# Patient Record
Sex: Female | Born: 1950 | Race: White | Hispanic: No | Smoking: Never smoker
Health system: Southern US, Community
[De-identification: ages and names within clinical notes are randomized; demographics above are authoritative.]

## PROBLEM LIST (undated history)

## (undated) DIAGNOSIS — C8590 Non-Hodgkin lymphoma, unspecified, unspecified site: Secondary | ICD-10-CM

## (undated) DIAGNOSIS — K859 Acute pancreatitis without necrosis or infection, unspecified: Secondary | ICD-10-CM

## (undated) DIAGNOSIS — I10 Essential (primary) hypertension: Secondary | ICD-10-CM

## (undated) DIAGNOSIS — E669 Obesity, unspecified: Secondary | ICD-10-CM

## (undated) DIAGNOSIS — Z87440 Personal history of urinary (tract) infections: Secondary | ICD-10-CM

## (undated) DIAGNOSIS — M8580 Other specified disorders of bone density and structure, unspecified site: Secondary | ICD-10-CM

## (undated) DIAGNOSIS — F40240 Claustrophobia: Secondary | ICD-10-CM

## (undated) DIAGNOSIS — Z9289 Personal history of other medical treatment: Secondary | ICD-10-CM

## (undated) HISTORY — PX: BIOPSY THYROID: PRO38

## (undated) HISTORY — DX: Non-Hodgkin lymphoma, unspecified, unspecified site: C85.90

## (undated) HISTORY — DX: Personal history of other medical treatment: Z92.89

## (undated) HISTORY — PX: BONE MARROW BIOPSY: SHX199

## (undated) HISTORY — DX: Acute pancreatitis without necrosis or infection, unspecified: K85.90

## (undated) HISTORY — DX: Personal history of urinary (tract) infections: Z87.440

## (undated) HISTORY — PX: TONSILLECTOMY AND ADENOIDECTOMY: SUR1326

## (undated) HISTORY — PX: OTHER SURGICAL HISTORY: SHX169

## (undated) HISTORY — DX: Other specified disorders of bone density and structure, unspecified site: M85.80

## (undated) HISTORY — PX: PORTA CATH INSERTION: CATH118285

## (undated) HISTORY — PX: CHOLECYSTECTOMY: SHX55

## (undated) HISTORY — PX: TOTAL KNEE ARTHROPLASTY: SHX125

---

## 2006-02-21 DIAGNOSIS — C8590 Non-Hodgkin lymphoma, unspecified, unspecified site: Secondary | ICD-10-CM

## 2006-02-21 HISTORY — DX: Non-Hodgkin lymphoma, unspecified, unspecified site: C85.90

## 2014-01-28 DIAGNOSIS — N8500 Endometrial hyperplasia, unspecified: Secondary | ICD-10-CM | POA: Diagnosis not present

## 2015-02-23 DIAGNOSIS — C8590 Non-Hodgkin lymphoma, unspecified, unspecified site: Secondary | ICD-10-CM | POA: Diagnosis not present

## 2015-02-23 DIAGNOSIS — I10 Essential (primary) hypertension: Secondary | ICD-10-CM | POA: Diagnosis not present

## 2015-02-23 DIAGNOSIS — M1990 Unspecified osteoarthritis, unspecified site: Secondary | ICD-10-CM | POA: Diagnosis not present

## 2015-02-23 DIAGNOSIS — M6281 Muscle weakness (generalized): Secondary | ICD-10-CM | POA: Diagnosis not present

## 2015-02-23 DIAGNOSIS — T8453XD Infection and inflammatory reaction due to internal right knee prosthesis, subsequent encounter: Secondary | ICD-10-CM | POA: Diagnosis not present

## 2015-02-25 DIAGNOSIS — T8453XA Infection and inflammatory reaction due to internal right knee prosthesis, initial encounter: Secondary | ICD-10-CM | POA: Diagnosis not present

## 2015-03-31 DIAGNOSIS — Z471 Aftercare following joint replacement surgery: Secondary | ICD-10-CM | POA: Diagnosis not present

## 2015-03-31 DIAGNOSIS — Z96653 Presence of artificial knee joint, bilateral: Secondary | ICD-10-CM | POA: Diagnosis not present

## 2015-03-31 DIAGNOSIS — T8450XD Infection and inflammatory reaction due to unspecified internal joint prosthesis, subsequent encounter: Secondary | ICD-10-CM | POA: Diagnosis not present

## 2015-03-31 DIAGNOSIS — M25561 Pain in right knee: Secondary | ICD-10-CM | POA: Diagnosis not present

## 2015-04-08 DIAGNOSIS — M869 Osteomyelitis, unspecified: Secondary | ICD-10-CM | POA: Diagnosis not present

## 2015-04-08 DIAGNOSIS — M25561 Pain in right knee: Secondary | ICD-10-CM | POA: Diagnosis not present

## 2015-04-23 DIAGNOSIS — Z96653 Presence of artificial knee joint, bilateral: Secondary | ICD-10-CM | POA: Diagnosis not present

## 2015-04-23 DIAGNOSIS — Z471 Aftercare following joint replacement surgery: Secondary | ICD-10-CM | POA: Diagnosis not present

## 2015-06-24 DIAGNOSIS — M25562 Pain in left knee: Secondary | ICD-10-CM | POA: Diagnosis not present

## 2015-06-24 DIAGNOSIS — M25561 Pain in right knee: Secondary | ICD-10-CM | POA: Diagnosis not present

## 2015-07-08 ENCOUNTER — Encounter (HOSPITAL_COMMUNITY): Payer: Self-pay | Admitting: Emergency Medicine

## 2015-07-08 ENCOUNTER — Telehealth: Payer: Self-pay

## 2015-07-08 ENCOUNTER — Emergency Department (HOSPITAL_COMMUNITY)
Admission: EM | Admit: 2015-07-08 | Discharge: 2015-07-09 | Disposition: A | Discharge status: Home / Self Care | Payer: Medicare Other | Attending: Emergency Medicine | Admitting: Emergency Medicine

## 2015-07-08 ENCOUNTER — Ambulatory Visit (HOSPITAL_COMMUNITY)
Admission: RE | Admit: 2015-07-08 | Discharge: 2015-07-08 | Disposition: A | Discharge status: Home / Self Care | Payer: Medicare Other | Source: Ambulatory Visit | Attending: Family Medicine | Admitting: Family Medicine

## 2015-07-08 ENCOUNTER — Ambulatory Visit (INDEPENDENT_AMBULATORY_CARE_PROVIDER_SITE_OTHER): Payer: Medicare Other | Admitting: Family Medicine

## 2015-07-08 VITALS — BP 134/84 | HR 103 | Temp 98.3°F | Resp 17 | Ht 66.0 in | Wt 310.0 lb

## 2015-07-08 DIAGNOSIS — Z79899 Other long term (current) drug therapy: Secondary | ICD-10-CM | POA: Diagnosis not present

## 2015-07-08 DIAGNOSIS — R110 Nausea: Secondary | ICD-10-CM | POA: Insufficient documentation

## 2015-07-08 DIAGNOSIS — R112 Nausea with vomiting, unspecified: Secondary | ICD-10-CM | POA: Diagnosis not present

## 2015-07-08 DIAGNOSIS — N390 Urinary tract infection, site not specified: Secondary | ICD-10-CM | POA: Insufficient documentation

## 2015-07-08 DIAGNOSIS — E669 Obesity, unspecified: Secondary | ICD-10-CM | POA: Diagnosis not present

## 2015-07-08 DIAGNOSIS — R509 Fever, unspecified: Secondary | ICD-10-CM

## 2015-07-08 DIAGNOSIS — Z880 Allergy status to penicillin: Secondary | ICD-10-CM | POA: Insufficient documentation

## 2015-07-08 DIAGNOSIS — R0600 Dyspnea, unspecified: Secondary | ICD-10-CM | POA: Diagnosis not present

## 2015-07-08 DIAGNOSIS — R911 Solitary pulmonary nodule: Secondary | ICD-10-CM | POA: Diagnosis not present

## 2015-07-08 DIAGNOSIS — R918 Other nonspecific abnormal finding of lung field: Secondary | ICD-10-CM | POA: Diagnosis not present

## 2015-07-08 DIAGNOSIS — I10 Essential (primary) hypertension: Secondary | ICD-10-CM | POA: Diagnosis not present

## 2015-07-08 DIAGNOSIS — M25569 Pain in unspecified knee: Secondary | ICD-10-CM | POA: Diagnosis not present

## 2015-07-08 DIAGNOSIS — R5383 Other fatigue: Secondary | ICD-10-CM | POA: Diagnosis not present

## 2015-07-08 HISTORY — DX: Essential (primary) hypertension: I10

## 2015-07-08 HISTORY — DX: Obesity, unspecified: E66.9

## 2015-07-08 LAB — COMPREHENSIVE METABOLIC PANEL
ALT: 12 U/L — ABNORMAL LOW (ref 14–54)
AST: 13 U/L — ABNORMAL LOW (ref 15–41)
Albumin: 3.1 g/dL — ABNORMAL LOW (ref 3.5–5.0)
Alkaline Phosphatase: 57 U/L (ref 38–126)
Anion gap: 12 (ref 5–15)
BUN: 37 mg/dL — ABNORMAL HIGH (ref 6–20)
CO2: 26 mmol/L (ref 22–32)
Calcium: 9 mg/dL (ref 8.9–10.3)
Chloride: 101 mmol/L (ref 101–111)
Creatinine, Ser: 2.04 mg/dL — ABNORMAL HIGH (ref 0.44–1.00)
GFR calc Af Amer: 28 mL/min — ABNORMAL LOW (ref >60)
GFR calc non Af Amer: 24 mL/min — ABNORMAL LOW (ref >60)
Glucose, Bld: 142 mg/dL — ABNORMAL HIGH (ref 65–99)
Potassium: 3.5 mmol/L (ref 3.5–5.1)
Sodium: 139 mmol/L (ref 135–145)
Total Bilirubin: 0.6 mg/dL (ref 0.3–1.2)
Total Protein: 6.9 g/dL (ref 6.5–8.1)

## 2015-07-08 LAB — CBC WITH DIFFERENTIAL/PLATELET
Basophils Absolute: 0 10*3/uL (ref 0.0–0.1)
Basophils Relative: 0 %
Eosinophils Absolute: 0.1 10*3/uL (ref 0.0–0.7)
Eosinophils Relative: 1 %
HCT: 35.1 % — ABNORMAL LOW (ref 36.0–46.0)
Hemoglobin: 11.6 g/dL — ABNORMAL LOW (ref 12.0–15.0)
Lymphocytes Relative: 16 %
Lymphs Abs: 2 10*3/uL (ref 0.7–4.0)
MCH: 31.6 pg (ref 26.0–34.0)
MCHC: 33 g/dL (ref 30.0–36.0)
MCV: 95.6 fL (ref 78.0–100.0)
Monocytes Absolute: 1.1 10*3/uL — ABNORMAL HIGH (ref 0.1–1.0)
Monocytes Relative: 9 %
Neutro Abs: 9.4 10*3/uL — ABNORMAL HIGH (ref 1.7–7.7)
Neutrophils Relative %: 74 %
Platelets: 377 10*3/uL (ref 150–400)
RBC: 3.67 MIL/uL — ABNORMAL LOW (ref 3.87–5.11)
RDW: 14 % (ref 11.5–15.5)
WBC: 12.7 10*3/uL — ABNORMAL HIGH (ref 4.0–10.5)

## 2015-07-08 LAB — POCT CBC
Granulocyte percent: 80.8 %G — AB (ref 37–80)
HCT, POC: 35.5 % — AB (ref 37.7–47.9)
Hemoglobin: 12.3 g/dL (ref 12.2–16.2)
Lymph, poc: 2 (ref 0.6–3.4)
MCH, POC: 32 pg — AB (ref 27–31.2)
MCHC: 34.6 g/dL (ref 31.8–35.4)
MCV: 92.5 fL (ref 80–97)
MID (cbc): 0.4 (ref 0–0.9)
MPV: 7.4 fL (ref 0–99.8)
POC Granulocyte: 9.9 — AB (ref 2–6.9)
POC LYMPH PERCENT: 16 %L (ref 10–50)
POC MID %: 3.2 %M (ref 0–12)
Platelet Count, POC: 373 10*3/uL (ref 142–424)
RBC: 3.83 M/uL — AB (ref 4.04–5.48)
RDW, POC: 14.1 %
WBC: 12.2 10*3/uL — AB (ref 4.6–10.2)

## 2015-07-08 LAB — URINE MICROSCOPIC-ADD ON: RBC / HPF: NONE SEEN RBC/hpf (ref 0–5)

## 2015-07-08 LAB — URINALYSIS, ROUTINE W REFLEX MICROSCOPIC
Glucose, UA: NEGATIVE mg/dL
Hgb urine dipstick: NEGATIVE
Ketones, ur: 15 mg/dL — AB
Nitrite: NEGATIVE
Protein, ur: 30 mg/dL — AB
Specific Gravity, Urine: 1.029 (ref 1.005–1.030)
pH: 5 (ref 5.0–8.0)

## 2015-07-08 LAB — COMPLETE METABOLIC PANEL WITH GFR
ALT: 10 U/L (ref 6–29)
AST: 11 U/L (ref 10–35)
Albumin: 3.9 g/dL (ref 3.6–5.1)
Alkaline Phosphatase: 63 U/L (ref 33–130)
BUN: 28 mg/dL — ABNORMAL HIGH (ref 7–25)
CO2: 25 mmol/L (ref 20–31)
Calcium: 9.6 mg/dL (ref 8.6–10.4)
Chloride: 98 mmol/L (ref 98–110)
Creat: 1.14 mg/dL — ABNORMAL HIGH (ref 0.50–0.99)
GFR, Est African American: 58 mL/min — ABNORMAL LOW (ref >=60)
GFR, Est Non African American: 50 mL/min — ABNORMAL LOW (ref >=60)
Glucose, Bld: 129 mg/dL — ABNORMAL HIGH (ref 65–99)
Potassium: 3.8 mmol/L (ref 3.5–5.3)
Sodium: 138 mmol/L (ref 135–146)
Total Bilirubin: 0.9 mg/dL (ref 0.2–1.2)
Total Protein: 7.2 g/dL (ref 6.1–8.1)

## 2015-07-08 LAB — POCT SEDIMENTATION RATE: POCT SED RATE: 105 mm/hr — AB (ref 0–22)

## 2015-07-08 LAB — D-DIMER, QUANTITATIVE: D-Dimer, Quant: 3.76 ug/mL-FEU — ABNORMAL HIGH (ref 0.00–0.48)

## 2015-07-08 MED ORDER — ONDANSETRON 4 MG PO TBDP
4.0000 mg | ORAL_TABLET | Freq: Once | ORAL | Status: AC
  Administered 2015-07-08: 4 mg via ORAL
  Filled 2015-07-08: qty 1

## 2015-07-08 MED ORDER — IOPAMIDOL (ISOVUE-370) INJECTION 76%
INTRAVENOUS | Status: AC
  Filled 2015-07-08: qty 100

## 2015-07-08 MED ORDER — SODIUM CHLORIDE 0.9 % IV BOLUS (SEPSIS)
1000.0000 mL | Freq: Once | INTRAVENOUS | Status: AC
  Administered 2015-07-08: 1000 mL via INTRAVENOUS

## 2015-07-08 MED ORDER — SULFAMETHOXAZOLE-TRIMETHOPRIM 800-160 MG PO TABS
1.0000 | ORAL_TABLET | Freq: Once | ORAL | Status: AC
  Administered 2015-07-08: 1 via ORAL
  Filled 2015-07-08: qty 1

## 2015-07-08 NOTE — Addendum Note (Signed)
Addended by: Kem Boroughs D on: 07/08/2015 04:57 PM   Modules accepted: Orders

## 2015-07-08 NOTE — Patient Instructions (Addendum)
  We will be looking for the cause of this cough and low grade fever.  The tests being run today should offer some insight.  I hope to have these test results back tomorrow.

## 2015-07-08 NOTE — ED Notes (Signed)
Pt. reports low garde fever with nausea , emesis and fatigue onset last month . No emesis at arrival , respirations unlabored , alert and oriented .

## 2015-07-08 NOTE — ED Provider Notes (Signed)
CSN: [id number]     Arrival date & time 07/08/15  1956 History   First MD Initiated Contact with Patient 07/08/15 2128     Chief Complaint  Patient presents with  . Fever  . Fatigue  . Emesis     (Consider location/radiation/quality/duration/timing/severity/associated sxs/prior Treatment) HPI   65 year old female with multiple complaints. Low-grade fevers and intermittent nausea and generalized fatigue for the last several weeks to about a month. Mild dyspnea. No cough. No chest pain. Unusual leg pain or swelling. No sick contacts. No urinary complaints.  Past Medical History  Diagnosis Date  . Hypertension   . Obesity    Past Surgical History  Procedure Laterality Date  . Cholecystectomy    . Total knee arthroplasty     No family history on file. Social History  Substance Use Topics  . Smoking status: Never Smoker   . Smokeless tobacco: None  . Alcohol Use: 0.0 oz/week    0 Standard drinks or equivalent per week   OB History    No data available     Review of Systems  All systems reviewed and negative, other than as noted in HPI.   Allergies  Ciprofloxin hcl; Morphine and related; Asa; Codeine; and Penicillins  Home Medications   Prior to Admission medications   Medication Sig Start Date End Date Taking? Authorizing Provider  acetaminophen (TYLENOL) 500 MG tablet Take 1,500 mg by mouth every 6 (six) hours as needed for moderate pain.   Yes Historical Provider, MD  benazepril-hydrochlorthiazide (LOTENSIN HCT) 20-25 MG tablet Take 1 tablet by mouth daily.   Yes Historical Provider, MD  metoprolol (LOPRESSOR) 50 MG tablet Take 50 mg by mouth 2 (two) times daily.   Yes Historical Provider, MD  senna (SENOKOT) 8.6 MG TABS tablet Take 2 tablets by mouth daily.   Yes Historical Provider, MD   BP 140/67 mmHg  Pulse 78  Temp(Src) 98.8 F (37.1 C) (Oral)  Resp 12  Ht 5\' 9"  (1.753 m)  Wt 310 lb (140.615 kg)  BMI 45.76 kg/m2  SpO2 100% Physical Exam   Constitutional: She appears well-developed and well-nourished. No distress.  HENT:  Head: Normocephalic and atraumatic.  Eyes: Conjunctivae are normal. Right eye exhibits no discharge. Left eye exhibits no discharge.  Neck: Neck supple.  Cardiovascular: Normal rate, regular rhythm and normal heart sounds.  Exam reveals no gallop and no friction rub.   No murmur heard. Pulmonary/Chest: Effort normal and breath sounds normal. No respiratory distress.  Abdominal: Soft. She exhibits no distension. There is no tenderness.  Musculoskeletal: She exhibits no edema or tenderness.  Neurological: She is alert.  Skin: Skin is warm and dry.  Psychiatric: She has a normal mood and affect. Her behavior is normal. Thought content normal.  Nursing note and vitals reviewed.   ED Course  Procedures (including critical care time) Labs Review Labs Reviewed  CBC WITH DIFFERENTIAL/PLATELET - Abnormal; Notable for the following:    WBC 12.7 (*)    RBC 3.67 (*)    Hemoglobin 11.6 (*)    HCT 35.1 (*)    Neutro Abs 9.4 (*)    Monocytes Absolute 1.1 (*)    All other components within normal limits  COMPREHENSIVE METABOLIC PANEL - Abnormal; Notable for the following:    Glucose, Bld 142 (*)    BUN 37 (*)    Creatinine, Ser 2.04 (*)    Albumin 3.1 (*)    AST 13 (*)    ALT 12 (*)  GFR calc non Af Amer 24 (*)    GFR calc Af Amer 28 (*)    All other components within normal limits  URINALYSIS, ROUTINE W REFLEX MICROSCOPIC (NOT AT [facility]) - Abnormal; Notable for the following:    Color, Urine ORANGE (*)    APPearance TURBID (*)    Bilirubin Urine MODERATE (*)    Ketones, ur 15 (*)    Protein, ur 30 (*)    Leukocytes, UA MODERATE (*)    All other components within normal limits  URINE MICROSCOPIC-ADD ON - Abnormal; Notable for the following:    Squamous Epithelial / LPF 6-30 (*)    Bacteria, UA MANY (*)    All other components within normal limits  URINE CULTURE    Imaging Review No results  found. I have personally reviewed and evaluated these images and lab results as part of my medical decision-making.   EKG Interpretation None      MDM   Final diagnoses:  Dyspnea    65 year old female with multiple symptoms. Many very nonspecific. General malaise, fatigue and also some nausea. I suspect the Moser symptoms are secondary to UTI. Her urinalysis is consistent with this. She was started on antibiotics. She does also endorse an occasional cough. Some concern by previous providers that she may potentially have PE. She had outpatient d-dimer which was elevated over 3. Will obtain a VQ scan. Her creatinine is elevated. Some of this might be prerenal with recent vomiting. Per her report suspect that she may have some underlying chronic kidney disease though. She'll be given IV fluids. If her VQ scan is negative I feel that she can probably be discharged with continued treatment for UTI.  Virgel Manifold, MD 07/15/15 [phone]

## 2015-07-08 NOTE — Telephone Encounter (Signed)
LMOV.  The patient is scheduled at [facility] today, 07/08/15, at 6:00pm for the STAT CT Angio Chest.  The scheduler said if she is unable to make this appointment, she needs to go to the ED to have it done, since it is a potential blood clot, which is a very serious matter.  I was unable to reach her directly and asked her to return my call.  Please inform the patient should she call back.  I spoke with Renee at the TL desk who said she would inform Dr Joseph Art.

## 2015-07-08 NOTE — Addendum Note (Signed)
Addended by: Robyn Haber on: 07/08/2015 03:24 PM   Modules accepted: Orders

## 2015-07-08 NOTE — Addendum Note (Signed)
Addended by: Elie Confer on: 07/08/2015 04:29 PM   Modules accepted: Orders

## 2015-07-08 NOTE — Progress Notes (Addendum)
65 yo woman from [place] who is recovering from bilateral knee replacement ([id number]) complicated by wound infection on right.  Last visit to [facility]) suggested no ongoing infection.  She had tests recently which haven't come back yet to check on infection.  She comes in with intermittent low grade fever for weeks.  Coughing clear phlegm  She has had nausea with vomiting, most recently last night. Has a h/o post nasal drip for years.  No diarrhea or urinary symptoms.  She had a venous doppler up in [place].  Allergies  Allergen Reactions  . Morphine And Related Anaphylaxis  . Asa [Aspirin] Hives  . Codeine Hives  . Penicillins Other (See Comments)    Patient does not know    No past medical history on file. On metoprolol 50 and benazepril hct 20-25  Objective:  NAD, has violent cough BP 134/84 mmHg  Pulse 103  Temp(Src) 98.3 F (36.8 C) (Oral)  Resp 17  Ht 5\' 6"  (1.676 m)  Wt 310 lb (140.615 kg)  BMI 50.06 kg/m2  SpO2 95% HEENT:  Normal nose, oropharynx Neck: supple, Chest:  Clear to auscultation Heart:  No murmur Skin: healed anterior operative scar right knee.  Assessment:  It's hard to explain the persistent low grade fever and cough.  Plan:   Dx:  D-dimer, sed rate, CBC  -Call [facility] labs about other tests being run. Rx:  No new meds at this point.  Signed,  Robyn Haber, MD  Results for orders placed or performed in visit on 07/08/15  D-dimer, quantitative (not at [facility])  Result Value Ref Range   D-Dimer, Quant 3.76 (H) 0.00 - 0.48 ug/mL-FEU  POCT CBC  Result Value Ref Range   WBC 12.2 (A) 4.6 - 10.2 K/uL   Lymph, poc 2.0 0.6 - 3.4   POC LYMPH PERCENT 16.0 10 - 50 %L   MID (cbc) 0.4 0 - 0.9   POC MID % 3.2 0 - 12 %M   POC Granulocyte 9.9 (A) 2 - 6.9   Granulocyte percent 80.8 (A) 37 - 80 %G   RBC 3.83 (A) 4.04 - 5.48 M/uL   Hemoglobin 12.3 12.2 - 16.2 g/dL   HCT, POC 35.5 (A) 37.7 - 47.9 %   MCV 92.5 80 - 97 fL   MCH, POC 32.0 (A) 27 - 31.2  pg   MCHC 34.6 31.8 - 35.4 g/dL   RDW, POC 14.1 %   Platelet Count, POC 373 142 - 424 K/uL   MPV 7.4 0 - 99.8 fL  POCT SEDIMENTATION RATE  Result Value Ref Range   POCT SED RATE 105 (A) 0 - 22 mm/hr

## 2015-07-08 NOTE — Telephone Encounter (Signed)
The patient has now been notified about her radiology appointment.

## 2015-07-09 ENCOUNTER — Emergency Department (HOSPITAL_COMMUNITY): Payer: Medicare Other

## 2015-07-09 ENCOUNTER — Other Ambulatory Visit: Payer: Self-pay | Admitting: Family Medicine

## 2015-07-09 DIAGNOSIS — R7989 Other specified abnormal findings of blood chemistry: Secondary | ICD-10-CM

## 2015-07-09 DIAGNOSIS — R918 Other nonspecific abnormal finding of lung field: Secondary | ICD-10-CM | POA: Diagnosis not present

## 2015-07-09 DIAGNOSIS — R0600 Dyspnea, unspecified: Secondary | ICD-10-CM | POA: Diagnosis not present

## 2015-07-09 DIAGNOSIS — R911 Solitary pulmonary nodule: Secondary | ICD-10-CM | POA: Diagnosis not present

## 2015-07-09 DIAGNOSIS — R112 Nausea with vomiting, unspecified: Secondary | ICD-10-CM | POA: Diagnosis not present

## 2015-07-09 MED ORDER — ONDANSETRON HCL 4 MG PO TABS: 4.0000 mg | ORAL_TABLET | Freq: Four times a day (QID) | ORAL | Status: DC

## 2015-07-09 MED ORDER — SULFAMETHOXAZOLE-TRIMETHOPRIM 800-160 MG PO TABS: 1.0000 | ORAL_TABLET | Freq: Two times a day (BID) | ORAL | Status: AC

## 2015-07-09 MED ORDER — TECHNETIUM TO 99M ALBUMIN AGGREGATED
4.3700 | Freq: Once | INTRAVENOUS | Status: AC | PRN
  Administered 2015-07-09: 4 via INTRAVENOUS

## 2015-07-09 MED ORDER — TECHNETIUM TC 99M DIETHYLENETRIAME-PENTAACETIC ACID: 32.7000 | Freq: Once | INTRAVENOUS | Status: DC | PRN

## 2015-07-09 MED ORDER — ONDANSETRON HCL 4 MG PO TABS
4.0000 mg | ORAL_TABLET | Freq: Once | ORAL | Status: AC
  Administered 2015-07-09: 4 mg via ORAL
  Filled 2015-07-09: qty 1

## 2015-07-09 NOTE — Discharge Instructions (Signed)

## 2015-07-09 NOTE — ED Provider Notes (Signed)
Patient initially seen and evaluated by Dr. Wilson Singer sent for VQ scan because of elevated d-dimer and elevated creatinine. Chest x-ray obtained for purposes of VQ scan showed nodule on the left lung. Patient does not have a PCP, so a CT was obtained to evaluate lung nodule. CT shows no evidence of actual nodule-probable shadows from superimposition of structures. VQ scan was low probability for pulmonary embolism. She is discharged with prescriptions for trimethoprim-sulfamethoxazole and ondansetron.  Results for orders placed or performed during the hospital encounter of 07/08/15  CBC with Differential  Result Value Ref Range   WBC 12.7 (H) 4.0 - 10.5 K/uL   RBC 3.67 (L) 3.87 - 5.11 MIL/uL   Hemoglobin 11.6 (L) 12.0 - 15.0 g/dL   HCT 35.1 (L) 36.0 - 46.0 %   MCV 95.6 78.0 - 100.0 fL   MCH 31.6 26.0 - 34.0 pg   MCHC 33.0 30.0 - 36.0 g/dL   RDW 14.0 11.5 - 15.5 %   Platelets 377 150 - 400 K/uL   Neutrophils Relative % 74 %   Neutro Abs 9.4 (H) 1.7 - 7.7 K/uL   Lymphocytes Relative 16 %   Lymphs Abs 2.0 0.7 - 4.0 K/uL   Monocytes Relative 9 %   Monocytes Absolute 1.1 (H) 0.1 - 1.0 K/uL   Eosinophils Relative 1 %   Eosinophils Absolute 0.1 0.0 - 0.7 K/uL   Basophils Relative 0 %   Basophils Absolute 0.0 0.0 - 0.1 K/uL  Comprehensive metabolic panel  Result Value Ref Range   Sodium 139 135 - 145 mmol/L   Potassium 3.5 3.5 - 5.1 mmol/L   Chloride 101 101 - 111 mmol/L   CO2 26 22 - 32 mmol/L   Glucose, Bld 142 (H) 65 - 99 mg/dL   BUN 37 (H) 6 - 20 mg/dL   Creatinine, Ser 2.04 (H) 0.44 - 1.00 mg/dL   Calcium 9.0 8.9 - 10.3 mg/dL   Total Protein 6.9 6.5 - 8.1 g/dL   Albumin 3.1 (L) 3.5 - 5.0 g/dL   AST 13 (L) 15 - 41 U/L   ALT 12 (L) 14 - 54 U/L   Alkaline Phosphatase 57 38 - 126 U/L   Total Bilirubin 0.6 0.3 - 1.2 mg/dL   GFR calc non Af Amer 24 (L) >60 mL/min   GFR calc Af Amer 28 (L) >60 mL/min   Anion gap 12 5 - 15  Urinalysis, Routine w reflex microscopic (not at [facility]  Result  Value Ref Range   Color, Urine ORANGE (A) YELLOW   APPearance TURBID (A) CLEAR   Specific Gravity, Urine 1.029 1.005 - 1.030   pH 5.0 5.0 - 8.0   Glucose, UA NEGATIVE NEGATIVE mg/dL   Hgb urine dipstick NEGATIVE NEGATIVE   Bilirubin Urine MODERATE (A) NEGATIVE   Ketones, ur 15 (A) NEGATIVE mg/dL   Protein, ur 30 (A) NEGATIVE mg/dL   Nitrite NEGATIVE NEGATIVE   Leukocytes, UA MODERATE (A) NEGATIVE  Urine microscopic-add on  Result Value Ref Range   Squamous Epithelial / LPF 6-30 (A) NONE SEEN   WBC, UA 6-30 0 - 5 WBC/hpf   RBC / HPF NONE SEEN 0 - 5 RBC/hpf   Bacteria, UA MANY (A) NONE SEEN   Urine-Other YEAST PRESENT    Dg Chest 2 View  07/09/2015  CLINICAL DATA:  Cough and low-grade fever with nausea for 2 weeks. EXAM: CHEST  2 VIEW COMPARISON:  None. FINDINGS: Normal heart size and pulmonary vascularity. Mild hyperinflation. 9 mm  focal nodular opacity projected in or over the left upper lung. CT suggested to exclude significant pulmonary nodule. No focal consolidation in the lungs. No blunting of costophrenic angles. No pneumothorax. Tortuous aorta. Degenerative changes in the spine. IMPRESSION: 9 mm nodular opacity in the left upper lung. CT suggested for further evaluation. Electronically Signed   By: Lucienne Capers M.D.   On: 07/09/2015 02:09   Ct Chest Wo Contrast  07/09/2015  CLINICAL DATA:  Follow-up of lung nodule seen on chest radiograph. EXAM: CT CHEST WITHOUT CONTRAST TECHNIQUE: Multidetector CT imaging of the chest was performed following the standard protocol without IV contrast. COMPARISON:  Chest radiograph 07/09/2015 FINDINGS: No focal nodules identified in the left lung. Lesion seen on chest radiograph likely represented superimposed shadows or soft tissue structure. Both lungs are clear and expanded. No focal airspace disease or consolidation. No pleural effusions. No pneumothorax. Airways are patent. Normal heart size. Normal caliber thoracic aorta. No significant  lymphadenopathy in the chest. Esophagus is decompressed. Included portions of the upper abdominal organs demonstrate surgical absence of the gallbladder. No destructive bone lesions. Degenerative changes in the thoracic spine. IMPRESSION: No focal pulmonary nodules identified. Abnormality seen on previous chest x-ray likely represented superimposed structures. No evidence of active pulmonary disease. Electronically Signed   By: Lucienne Capers M.D.   On: 07/09/2015 03:03   Nm Pulmonary Perf And Vent  07/09/2015  CLINICAL DATA:  Fever, dyspnea, nausea and vomiting, weakness. Elevated D-dimer. EXAM: NUCLEAR MEDICINE VENTILATION - PERFUSION LUNG SCAN TECHNIQUE: Ventilation images were obtained in multiple projections using inhaled aerosol Tc-33m DTPA. Perfusion images were obtained in multiple projections after intravenous injection of Tc-19m MAA. RADIOPHARMACEUTICALS:  32.7 mCi Technetium-6m DTPA aerosol inhalation and 4.37 mCi Technetium-69m MAA IV COMPARISON:  Chest 07/09/2015 FINDINGS: Ventilation: Ventilation study demonstrates normal homogeneous distribution tracer activity throughout the lungs. No focal defects identified. Perfusion: Profusion study demonstrates normal homogeneous distribution tracer activity throughout the lungs. No focal defects identified. IMPRESSION: Very low probability of pulmonary embolus. Electronically Signed   By: Lucienne Capers M.D.   On: 07/09/2015 [id number]      Delora Fuel, MD [id number]

## 2015-07-10 LAB — URINE CULTURE

## 2015-07-23 DIAGNOSIS — R5081 Fever presenting with conditions classified elsewhere: Secondary | ICD-10-CM | POA: Diagnosis not present

## 2015-07-23 DIAGNOSIS — N390 Urinary tract infection, site not specified: Secondary | ICD-10-CM | POA: Diagnosis not present

## 2015-07-30 DIAGNOSIS — M25561 Pain in right knee: Secondary | ICD-10-CM | POA: Diagnosis not present

## 2015-07-30 DIAGNOSIS — G8929 Other chronic pain: Secondary | ICD-10-CM | POA: Diagnosis not present

## 2015-07-30 DIAGNOSIS — M25562 Pain in left knee: Secondary | ICD-10-CM | POA: Diagnosis not present

## 2015-08-10 DIAGNOSIS — M25562 Pain in left knee: Secondary | ICD-10-CM | POA: Diagnosis not present

## 2015-08-10 DIAGNOSIS — M25561 Pain in right knee: Secondary | ICD-10-CM | POA: Diagnosis not present

## 2015-08-10 DIAGNOSIS — G8929 Other chronic pain: Secondary | ICD-10-CM | POA: Diagnosis not present

## 2015-08-12 DIAGNOSIS — M25562 Pain in left knee: Secondary | ICD-10-CM | POA: Diagnosis not present

## 2015-08-12 DIAGNOSIS — G8929 Other chronic pain: Secondary | ICD-10-CM | POA: Diagnosis not present

## 2015-08-12 DIAGNOSIS — M25561 Pain in right knee: Secondary | ICD-10-CM | POA: Diagnosis not present

## 2015-08-14 ENCOUNTER — Ambulatory Visit: Payer: Self-pay | Admitting: Adult Health

## 2015-08-24 DIAGNOSIS — G8929 Other chronic pain: Secondary | ICD-10-CM | POA: Diagnosis not present

## 2015-08-24 DIAGNOSIS — M25562 Pain in left knee: Secondary | ICD-10-CM | POA: Diagnosis not present

## 2015-08-24 DIAGNOSIS — M25561 Pain in right knee: Secondary | ICD-10-CM | POA: Diagnosis not present

## 2015-08-26 DIAGNOSIS — M25561 Pain in right knee: Secondary | ICD-10-CM | POA: Diagnosis not present

## 2015-08-27 DIAGNOSIS — M25562 Pain in left knee: Secondary | ICD-10-CM | POA: Diagnosis not present

## 2015-08-27 DIAGNOSIS — G8929 Other chronic pain: Secondary | ICD-10-CM | POA: Diagnosis not present

## 2015-08-27 DIAGNOSIS — M25561 Pain in right knee: Secondary | ICD-10-CM | POA: Diagnosis not present

## 2015-08-31 DIAGNOSIS — M25561 Pain in right knee: Secondary | ICD-10-CM | POA: Diagnosis not present

## 2015-08-31 DIAGNOSIS — M25562 Pain in left knee: Secondary | ICD-10-CM | POA: Diagnosis not present

## 2015-08-31 DIAGNOSIS — G8929 Other chronic pain: Secondary | ICD-10-CM | POA: Diagnosis not present

## 2015-11-05 ENCOUNTER — Other Ambulatory Visit: Payer: Self-pay | Admitting: Internal Medicine

## 2015-11-05 DIAGNOSIS — I10 Essential (primary) hypertension: Secondary | ICD-10-CM | POA: Diagnosis not present

## 2015-11-05 DIAGNOSIS — M170 Bilateral primary osteoarthritis of knee: Secondary | ICD-10-CM | POA: Diagnosis not present

## 2015-11-05 DIAGNOSIS — Z1231 Encounter for screening mammogram for malignant neoplasm of breast: Secondary | ICD-10-CM

## 2015-11-05 DIAGNOSIS — E559 Vitamin D deficiency, unspecified: Secondary | ICD-10-CM | POA: Diagnosis not present

## 2015-11-05 DIAGNOSIS — Z119 Encounter for screening for infectious and parasitic diseases, unspecified: Secondary | ICD-10-CM | POA: Diagnosis not present

## 2015-11-05 DIAGNOSIS — I4510 Unspecified right bundle-branch block: Secondary | ICD-10-CM | POA: Diagnosis not present

## 2015-11-09 DIAGNOSIS — R739 Hyperglycemia, unspecified: Secondary | ICD-10-CM | POA: Diagnosis not present

## 2015-11-26 ENCOUNTER — Ambulatory Visit
Admission: RE | Admit: 2015-11-26 | Discharge: 2015-11-26 | Disposition: A | Discharge status: Home / Self Care | Payer: Medicare Other | Source: Ambulatory Visit | Attending: Internal Medicine | Admitting: Internal Medicine

## 2015-11-26 DIAGNOSIS — Z1231 Encounter for screening mammogram for malignant neoplasm of breast: Secondary | ICD-10-CM

## 2015-12-25 DIAGNOSIS — H6123 Impacted cerumen, bilateral: Secondary | ICD-10-CM | POA: Diagnosis not present

## 2015-12-25 DIAGNOSIS — N6012 Diffuse cystic mastopathy of left breast: Secondary | ICD-10-CM | POA: Diagnosis not present

## 2015-12-25 DIAGNOSIS — N8500 Endometrial hyperplasia, unspecified: Secondary | ICD-10-CM | POA: Diagnosis not present

## 2015-12-25 DIAGNOSIS — Z8572 Personal history of non-Hodgkin lymphomas: Secondary | ICD-10-CM | POA: Diagnosis not present

## 2015-12-25 DIAGNOSIS — E041 Nontoxic single thyroid nodule: Secondary | ICD-10-CM | POA: Diagnosis not present

## 2015-12-25 DIAGNOSIS — Z0001 Encounter for general adult medical examination with abnormal findings: Secondary | ICD-10-CM | POA: Diagnosis not present

## 2015-12-28 ENCOUNTER — Other Ambulatory Visit: Payer: Self-pay | Admitting: Internal Medicine

## 2015-12-28 DIAGNOSIS — E041 Nontoxic single thyroid nodule: Secondary | ICD-10-CM

## 2016-01-01 ENCOUNTER — Other Ambulatory Visit: Payer: Medicare Other

## 2016-03-14 ENCOUNTER — Other Ambulatory Visit: Payer: Medicare Other

## 2016-05-11 ENCOUNTER — Ambulatory Visit
Admission: RE | Admit: 2016-05-11 | Discharge: 2016-05-11 | Disposition: A | Discharge status: Home / Self Care | Payer: Medicare Other | Source: Ambulatory Visit | Attending: Internal Medicine | Admitting: Internal Medicine

## 2016-05-11 DIAGNOSIS — E041 Nontoxic single thyroid nodule: Secondary | ICD-10-CM

## 2016-05-16 ENCOUNTER — Other Ambulatory Visit: Payer: Self-pay | Admitting: Internal Medicine

## 2016-05-16 DIAGNOSIS — E041 Nontoxic single thyroid nodule: Secondary | ICD-10-CM

## 2016-05-24 ENCOUNTER — Other Ambulatory Visit (HOSPITAL_COMMUNITY)
Admission: RE | Admit: 2016-05-24 | Discharge: 2016-05-24 | Disposition: A | Discharge status: Home / Self Care | Payer: Medicare Other | Source: Ambulatory Visit | Attending: Physician Assistant | Admitting: Physician Assistant

## 2016-05-24 ENCOUNTER — Ambulatory Visit
Admission: RE | Admit: 2016-05-24 | Discharge: 2016-05-24 | Disposition: A | Discharge status: Home / Self Care | Payer: Medicare Other | Source: Ambulatory Visit | Attending: Internal Medicine | Admitting: Internal Medicine

## 2016-05-24 DIAGNOSIS — D440 Neoplasm of uncertain behavior of thyroid gland: Secondary | ICD-10-CM | POA: Diagnosis not present

## 2016-05-24 DIAGNOSIS — E041 Nontoxic single thyroid nodule: Secondary | ICD-10-CM | POA: Diagnosis present

## 2016-05-24 NOTE — Procedures (Signed)
PROCEDURE SUMMARY:  Using direct ultrasound guidance, 4 passes were made using 25 g needles into the nodule within the left lobe of the thyroid.   Ultrasound was used to confirm needle placements on all occasions.   Specimens were sent to Pathology for analysis.  Roda Lauture S Haig Gerardo PA-C 05/24/2016 2:34 PM

## 2016-07-26 DIAGNOSIS — J301 Allergic rhinitis due to pollen: Secondary | ICD-10-CM | POA: Diagnosis not present

## 2016-07-26 DIAGNOSIS — J309 Allergic rhinitis, unspecified: Secondary | ICD-10-CM | POA: Diagnosis not present

## 2016-07-26 DIAGNOSIS — H1013 Acute atopic conjunctivitis, bilateral: Secondary | ICD-10-CM | POA: Diagnosis not present

## 2016-08-04 DIAGNOSIS — H6123 Impacted cerumen, bilateral: Secondary | ICD-10-CM | POA: Diagnosis not present

## 2016-11-02 ENCOUNTER — Other Ambulatory Visit: Payer: Self-pay | Admitting: Internal Medicine

## 2016-11-02 DIAGNOSIS — Z1231 Encounter for screening mammogram for malignant neoplasm of breast: Secondary | ICD-10-CM

## 2016-11-29 ENCOUNTER — Ambulatory Visit: Payer: Medicare Other

## 2016-12-27 ENCOUNTER — Ambulatory Visit: Payer: Medicare Other

## 2016-12-28 ENCOUNTER — Ambulatory Visit: Payer: Medicare Other

## 2017-01-19 ENCOUNTER — Other Ambulatory Visit: Payer: Self-pay | Admitting: Internal Medicine

## 2017-01-19 DIAGNOSIS — Z0000 Encounter for general adult medical examination without abnormal findings: Secondary | ICD-10-CM | POA: Diagnosis not present

## 2017-01-19 DIAGNOSIS — Z87898 Personal history of other specified conditions: Secondary | ICD-10-CM | POA: Diagnosis not present

## 2017-01-19 DIAGNOSIS — E2839 Other primary ovarian failure: Secondary | ICD-10-CM | POA: Diagnosis not present

## 2017-01-19 DIAGNOSIS — E559 Vitamin D deficiency, unspecified: Secondary | ICD-10-CM

## 2017-01-19 DIAGNOSIS — I10 Essential (primary) hypertension: Secondary | ICD-10-CM | POA: Diagnosis not present

## 2017-01-19 DIAGNOSIS — R7303 Prediabetes: Secondary | ICD-10-CM | POA: Diagnosis not present

## 2017-01-19 DIAGNOSIS — Z8572 Personal history of non-Hodgkin lymphomas: Secondary | ICD-10-CM | POA: Diagnosis not present

## 2017-01-19 DIAGNOSIS — Z1331 Encounter for screening for depression: Secondary | ICD-10-CM | POA: Diagnosis not present

## 2017-01-31 ENCOUNTER — Ambulatory Visit: Payer: Medicare Other

## 2017-02-20 ENCOUNTER — Other Ambulatory Visit: Payer: Medicare Other

## 2017-02-20 ENCOUNTER — Ambulatory Visit: Payer: Medicare Other

## 2017-03-13 ENCOUNTER — Ambulatory Visit: Payer: Medicare Other

## 2017-03-13 ENCOUNTER — Inpatient Hospital Stay
Admission: RE | Admit: 2017-03-13 | Discharge: 2017-03-13 | Disposition: A | Discharge status: Home / Self Care | Payer: Medicare Other | Source: Ambulatory Visit | Attending: Internal Medicine | Admitting: Internal Medicine

## 2017-03-15 ENCOUNTER — Ambulatory Visit: Payer: Medicare Other | Admitting: Adult Health

## 2017-03-28 ENCOUNTER — Ambulatory Visit: Payer: Medicare Other | Admitting: Gynecology

## 2017-03-31 ENCOUNTER — Other Ambulatory Visit: Payer: Medicare Other

## 2017-03-31 ENCOUNTER — Ambulatory Visit: Payer: Medicare Other

## 2017-04-07 ENCOUNTER — Ambulatory Visit (INDEPENDENT_AMBULATORY_CARE_PROVIDER_SITE_OTHER): Payer: Medicare Other | Admitting: Gynecology

## 2017-04-07 ENCOUNTER — Encounter: Payer: Self-pay | Admitting: Gynecology

## 2017-04-07 VITALS — BP 134/84 | Ht 66.0 in | Wt 333.0 lb

## 2017-04-07 DIAGNOSIS — Z124 Encounter for screening for malignant neoplasm of cervix: Secondary | ICD-10-CM | POA: Diagnosis not present

## 2017-04-07 DIAGNOSIS — Z9189 Other specified personal risk factors, not elsewhere classified: Secondary | ICD-10-CM

## 2017-04-07 DIAGNOSIS — Z01419 Encounter for gynecological examination (general) (routine) without abnormal findings: Secondary | ICD-10-CM

## 2017-04-07 DIAGNOSIS — N952 Postmenopausal atrophic vaginitis: Secondary | ICD-10-CM

## 2017-04-07 DIAGNOSIS — Z01411 Encounter for gynecological examination (general) (routine) with abnormal findings: Secondary | ICD-10-CM

## 2017-04-07 LAB — HM PAP SMEAR

## 2017-04-07 NOTE — Patient Instructions (Signed)
Follow-up in 1 year for annual exam, sooner if any issues. 

## 2017-04-07 NOTE — Progress Notes (Signed)
    Allison Short 30-Oct-1950 [id number]        66 y.o.  G1P1 new patient for breast and pelvic exam.  Has not had an exam since 2016.  Had a negative Pap smear then and no history of abnormal Pap smears previously.  Also had an endometrial biopsy which was negative which apparently was done due to workup for endometrial echo thickness and not for bleeding.  She is done no bleeding for years.  Past medical history,surgical history, problem list, medications, allergies, family history and social history were all reviewed and documented as reviewed in the EPIC chart.  ROS:  Performed with pertinent positives and negatives included in the history, assessment and plan.   Additional significant findings : None   Exam: Caryn Bee assistant Vitals:   04/07/17 0954  BP: 134/84  Weight: (!) 333 lb (151 kg)  Height: 5\' 6"  (1.676 m)   Body mass index is 53.75 kg/m.  General appearance:  Normal affect, orientation and appearance. Skin: Grossly normal HEENT: Without gross lesions.  No cervical or supraclavicular adenopathy. Thyroid normal.  Lungs:  Clear without wheezing, rales or rhonchi Cardiac: RR, without RMG Abdominal:  Soft, nontender, without masses, guarding, rebound, organomegaly or hernia Breasts:  Examined lying and sitting without masses, retractions, discharge or axillary adenopathy. Pelvic:  Ext, BUS, Vagina: With atrophic changes  Cervix: With atrophic changes  Uterus: Unable to palpate but no gross masses or tenderness gross  Adnexa: Without masses or tenderness    Anus and perineum: Normal   Rectovaginal: Normal sphincter tone without palpated masses or tenderness.    Assessment/Plan:  67 y.o. G1P1 female for breast and pelvic exam.   1. Postmenopausal/atrophic genital changes.  No significant hot flushes, night sweats, vaginal dryness or any bleeding.  Continue to monitor and report any issues or bleeding. 2. Pap smear 2016.  Pap smear done today.  No history of abnormal Pap  smears.  Options to stop screening per current screening guidelines reviewed.  Will readdress on an annual basis. 3. Mammography 03/2016 coming due now and patient will schedule in follow-up for this.  Breast exam normal today. 4. DEXA 3 years ago reported normal.  Plan repeat DEXA at age 50. 27. Colonoscopy 2011 with reported repeat interval 10 years. 6. Health maintenance.  No routine lab work done as patient does this elsewhere.  Follow-up 1 year, sooner as needed.   Anastasio Auerbach MD, 10:55 AM 04/07/2017

## 2017-04-07 NOTE — Addendum Note (Signed)
Addended by: Nelva Nay on: 04/07/2017 11:13 AM   Modules accepted: Orders

## 2017-04-11 LAB — PAP IG W/ RFLX HPV ASCU

## 2017-04-12 ENCOUNTER — Encounter: Payer: Self-pay | Admitting: Adult Health

## 2017-04-12 ENCOUNTER — Other Ambulatory Visit: Payer: Self-pay | Admitting: *Deleted

## 2017-04-12 ENCOUNTER — Encounter: Payer: Self-pay | Admitting: Gynecology

## 2017-04-12 ENCOUNTER — Ambulatory Visit (INDEPENDENT_AMBULATORY_CARE_PROVIDER_SITE_OTHER): Payer: Medicare Other | Admitting: Adult Health

## 2017-04-12 VITALS — BP 150/80 | Temp 98.0°F | Wt 336.0 lb

## 2017-04-12 DIAGNOSIS — C8591 Non-Hodgkin lymphoma, unspecified, lymph nodes of head, face, and neck: Secondary | ICD-10-CM | POA: Diagnosis not present

## 2017-04-12 DIAGNOSIS — Z7689 Persons encountering health services in other specified circumstances: Secondary | ICD-10-CM | POA: Diagnosis not present

## 2017-04-12 DIAGNOSIS — I10 Essential (primary) hypertension: Secondary | ICD-10-CM | POA: Diagnosis not present

## 2017-04-12 DIAGNOSIS — R0982 Postnasal drip: Secondary | ICD-10-CM

## 2017-04-12 MED ORDER — BENAZEPRIL-HYDROCHLOROTHIAZIDE 20-25 MG PO TABS: 1.0000 | ORAL_TABLET | Freq: Every day | ORAL | 2 refills | Status: DC

## 2017-04-12 MED ORDER — METOPROLOL TARTRATE 50 MG PO TABS: 50.0000 mg | ORAL_TABLET | Freq: Two times a day (BID) | ORAL | 2 refills | Status: DC

## 2017-04-12 MED ORDER — BUDESONIDE 32 MCG/ACT NA SUSP: 2.0000 | Freq: Every day | NASAL | 3 refills | Status: DC

## 2017-04-12 MED ORDER — FLUCONAZOLE 150 MG PO TABS: 150.0000 mg | ORAL_TABLET | Freq: Once | ORAL | 0 refills | Status: AC

## 2017-04-12 NOTE — Progress Notes (Signed)
Patient presents to clinic today to establish care. She is a pleasant 67 year old female who  has a past medical history of History of blood transfusion, History of urinary tract infection, Hypertension, Non Hodgkin's lymphoma ([place]) (2008), and Obesity.   She moved from [place] about two years ago to be closer to her son.   Her last physical was in November 2018.    Acute Concerns: Establish Care   Chronic Issues: Essential Hypertension - she is currently prescribed Lotensin HCT and Metoprolol 50 mg tablet  BP Readings from Last 3 Encounters:  04/12/17 (!) 150/80  04/07/17 134/84  07/09/15 152/69   Non Hodgkin's Lymphoma - Took chemo and radiation as a prophylactic measures in 2008. She was followed by Oncology for 5 years.     Health Maintenance:  Dental -- Routine Care  Vision -- Does not do routine care  Immunizations -- Does not want pneumonia vaccinations.  Colonoscopy -- 2011 Mammogram -- will have one on 04/20/2017  PAP -- 04/07/2017  Bone Density -- 04/20/2017  She is followed by  GYN - Dr. Phineas Real   Past Medical History:  Diagnosis Date  . History of blood transfusion    2016  . History of urinary tract infection   . Hypertension   . Non Hodgkin's lymphoma ([place]) 2008  . Obesity     Past Surgical History:  Procedure Laterality Date  . BONE MARROW BIOPSY    . CESAREAN SECTION  10/26/1975  . CHOLECYSTECTOMY     2006  . PORTA CATH INSERTION    . TONSILLECTOMY AND ADENOIDECTOMY     1952  . TOTAL KNEE ARTHROPLASTY Bilateral   . 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      Current Outpatient Medications on File Prior to Visit  Medication Sig Dispense Refill  . acetaminophen (TYLENOL) 500 MG tablet Take 500 mg by mouth every 6 (six) hours as needed for moderate pain.     . benazepril-hydrochlorthiazide (LOTENSIN HCT) 20-25 MG tablet Take 1 tablet by mouth daily.    . Cholecalciferol (VITAMIN D PO) Take 1,000 Units by mouth daily.     . metoprolol (LOPRESSOR) 50 MG tablet Take 50 mg  by mouth 2 (two) times daily.    . clindamycin (CLEOCIN) 300 MG capsule Take 1 hour before dental work as needed     No current facility-administered medications on file prior to visit.     Allergies  Allergen Reactions  . Ciprofloxin Hcl [Ciprofloxacin] Other (See Comments)    Unstable gait  . Morphine And Related Anaphylaxis  . Asa [Aspirin] Hives  . Codeine Hives  . Nsaids     Lowers GFR  . Penicillins Other (See Comments)    Patient does not know      Family History  Problem Relation Age of Onset  . Arthritis Maternal Grandmother     Social History   Socioeconomic History  . Marital status: Divorced    Spouse name: Not on file  . Number of children: Not on file  . Years of education: Not on file  . Highest education level: Not on file  Social Needs  . Financial resource strain: Not on file  . Food insecurity - worry: Not on file  . Food insecurity - inability: Not on file  . Transportation needs - medical: Not on file  . Transportation needs - non-medical: Not on file  Occupational History  . Not on file  Tobacco Use  . Smoking status: Never Smoker  .  Smokeless tobacco: Never Used  Substance and Sexual Activity  . Alcohol use: Yes    Alcohol/week: 0.0 oz    Comment: Occas  . Drug use: No  . Sexual activity: Not Currently    Comment: 1st intercourse 15 yo-5 partners  Other Topics Concern  . Not on file  Social History Narrative  . Not on file    Review of Systems  Constitutional: Negative.   HENT: Negative.   Eyes: Negative.   Respiratory: Negative.   Cardiovascular: Negative.   Gastrointestinal: Negative.   Genitourinary: Negative.   Musculoskeletal: Positive for back pain and joint pain.  Skin: Negative.   Neurological: Negative.   Endo/Heme/Allergies: Negative.   Psychiatric/Behavioral: Negative.   All other systems reviewed and are negative.    BP (!) 150/80 (BP Location: Left Wrist)   Temp 98 F (36.7 C) (Oral)   Wt (!) 336 lb (152.4  kg)   BMI 54.23 kg/m   Physical Exam  Constitutional: She is oriented to person, place, and time and well-developed, well-nourished, and in no distress. No distress.  Obesity    Cardiovascular: Normal rate, regular rhythm, normal heart sounds and intact distal pulses. Exam reveals no gallop and no friction rub.  No murmur heard. Pulmonary/Chest: Effort normal and breath sounds normal. No respiratory distress. She has no wheezes. She has no rales. She exhibits no tenderness.  Musculoskeletal: She exhibits no edema, tenderness or deformity.  Neurological: She is alert and oriented to person, place, and time. Gait normal. GCS score is 15.  Skin: Skin is warm and dry. She is not diaphoretic.  Surgical scars on bilateral knees  Psychiatric: Mood, memory, affect and judgment normal.  Nursing note and vitals reviewed.   Recent Results (from the past 2160 hour(s))  HM PAP SMEAR     Status: None   Collection Time: 04/07/17 12:00 AM  Result Value Ref Range   HM Pap smear Does not have results at this time   Pap IG w/ reflex to HPV when ASC-U     Status: None   Collection Time: 04/07/17 11:39 AM  Result Value Ref Range   Clinical Information:      Comment: None given   LMP:      Comment: NONE GIVEN   PREV. PAP:      Comment: NONE GIVEN   PREV. BX:      Comment: NONE GIVEN   HPV DNA Probe-Source      Comment: Cervix, Endocervix   STATEMENT OF ADEQUACY:      Comment: Satisfactory for evaluation. Endocervical/transformation zone component absent.    INTERPRETATION/RESULT:      Comment: Negative for intraepithelial lesion or malignancy.   INFECTION:      Comment: Fungal organisms morphologically consistent with Candida spp.    Comment:      Comment: This Pap test has been evaluated with computer assisted technology.    CYTOTECHNOLOGIST:      Comment: [facility], CT(HEW) CT screening location: [address], [address], [place], [place] [address] EXPLANATORY NOTE:  . The Pap is a  screening test for cervical cancer. It is  not a diagnostic test and is subject to false negative  and false positive results. It is most reliable when a  satisfactory sample, regularly obtained, is submitted  with relevant clinical findings and history, and when  the Pap result is evaluated along with historic and  current clinical information. .     Assessment/Plan:  1. Encounter to establish care - Follow  up in November for CPE  - Follow up as sooner if needed - Educated on diet and exercise to help loose weight   2. Essential hypertension - Elevated in the office today. Will continue to monitor  - benazepril-hydrochlorthiazide (LOTENSIN HCT) 20-25 MG tablet; Take 1 tablet by mouth daily.  Dispense: 90 tablet; Refill: 2 - metoprolol tartrate (LOPRESSOR) 50 MG tablet; Take 1 tablet (50 mg total) by mouth 2 (two) times daily.  Dispense: 90 tablet; Refill: 2  3. 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 PND (post-nasal drip) - budesonide (RHINOCORT AQUA) 32 MCG/ACT nasal spray; Place 2 sprays into both nostrils daily.  Dispense: 8.6 g; Refill: 3  Dorothyann Peng, NP

## 2017-04-20 ENCOUNTER — Ambulatory Visit
Admission: RE | Admit: 2017-04-20 | Discharge: 2017-04-20 | Disposition: A | Discharge status: Home / Self Care | Payer: Medicare Other | Source: Ambulatory Visit | Attending: Internal Medicine | Admitting: Internal Medicine

## 2017-04-20 DIAGNOSIS — Z1231 Encounter for screening mammogram for malignant neoplasm of breast: Secondary | ICD-10-CM

## 2017-04-20 DIAGNOSIS — M85852 Other specified disorders of bone density and structure, left thigh: Secondary | ICD-10-CM | POA: Diagnosis not present

## 2017-04-20 DIAGNOSIS — E559 Vitamin D deficiency, unspecified: Secondary | ICD-10-CM

## 2017-04-20 DIAGNOSIS — E2839 Other primary ovarian failure: Secondary | ICD-10-CM

## 2017-04-21 ENCOUNTER — Encounter: Payer: Self-pay | Admitting: Gynecology

## 2017-04-21 DIAGNOSIS — M8580 Other specified disorders of bone density and structure, unspecified site: Secondary | ICD-10-CM

## 2017-04-21 HISTORY — DX: Other specified disorders of bone density and structure, unspecified site: M85.80

## 2017-04-21 NOTE — Telephone Encounter (Signed)
Total daily dietary calcium should be about 1200 mg.  I would recommend taking a supplement that gets you to the 1200 mg mark after you evaluate how much you think you are getting by diet.  You can Google calcium content of food and they will list the various calcium contents of servings of various foods for you to estimate your daily intake.

## 2017-04-22 IMAGING — NM NM PULMONARY VENT & PERF
16 series · 16 of 16 positions shown · non-contrast
Comparison: Chest 07/09/2015

CLINICAL DATA: Fever, dyspnea, nausea and vomiting, weakness.
Elevated D-dimer.

EXAM:
NUCLEAR MEDICINE VENTILATION - PERFUSION LUNG SCAN
TECHNIQUE: Ventilation images were obtained in multiple projections using
inhaled aerosol Gc-ZZm DTPA. Perfusion images were obtained in
multiple projections after intravenous injection of Gc-ZZm MAA.
RADIOPHARMACEUTICALS:  32.7 mCi Bechnetium-22m DTPA aerosol
inhalation and 4.37 mCi Bechnetium-22m MAA IV

[Series 1: ant/post vent · 4.14mm/px · 1 of 1 slices shown (1 of 2)]
[im 1/1]
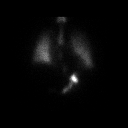

[Series 1: ant/post vent · 4.14mm/px · 1 of 1 slices shown (2 of 2)]
[im 1/1]
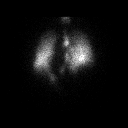

[Series 2: lao/rpo vent · 4.14mm/px · 1 of 1 slices shown (1 of 2)]
[im 1/1]
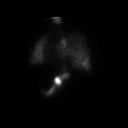

[Series 2: lao/rpo vent · 4.14mm/px · 1 of 1 slices shown (2 of 2)]
[im 1/1]
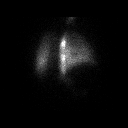

[Series 3: lpo/rao vent · 4.14mm/px · 1 of 1 slices shown (1 of 2)]
[im 1/1]
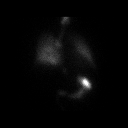

[Series 3: lpo/rao vent · 4.14mm/px · 1 of 1 slices shown (2 of 2)]
[im 1/1]
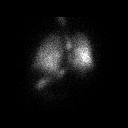

[Series 4: lt lat/rt lat vent · 4.14mm/px · 1 of 1 slices shown (1 of 2)]
[im 1/1]
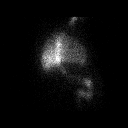

[Series 4: lt lat/rt lat vent · 4.14mm/px · 1 of 1 slices shown (2 of 2)]
[im 1/1]
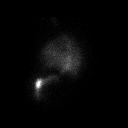

[Series 5: lt lat/rt lat perf · 4.14mm/px · 1 of 1 slices shown (1 of 2)]
[im 1/1]
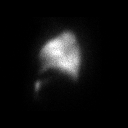

[Series 5: lt lat/rt lat perf · 4.14mm/px · 1 of 1 slices shown (2 of 2)]
[im 1/1]
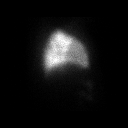

[Series 6: lpo/rao perf · 4.14mm/px · 1 of 1 slices shown (1 of 2)]
[im 1/1]
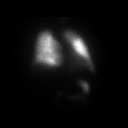

[Series 6: lpo/rao perf · 4.14mm/px · 1 of 1 slices shown (2 of 2)]
[im 1/1]
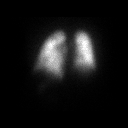

[Series 7: ant/post perf · 4.14mm/px · 1 of 1 slices shown (1 of 2)]
[im 1/1]
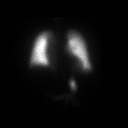

[Series 7: ant/post perf · 4.14mm/px · 1 of 1 slices shown (2 of 2)]
[im 1/1]
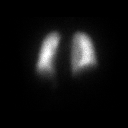

[Series 8: lao/rpo perf · 4.14mm/px · 1 of 1 slices shown (1 of 2)]
[im 1/1]
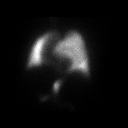

[Series 8: lao/rpo perf · 4.14mm/px · 1 of 1 slices shown (2 of 2)]
[im 1/1]
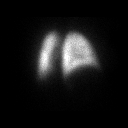

[16 of 16 positions shown; findings below may reference images not displayed]

FINDINGS: Ventilation: Ventilation study demonstrates normal homogeneous
distribution tracer activity throughout the lungs. No focal defects
identified.

Perfusion: Profusion study demonstrates normal homogeneous
distribution tracer activity throughout the lungs. No focal defects
identified.
IMPRESSION: Very low probability of pulmonary embolus.

## 2017-04-25 ENCOUNTER — Encounter: Payer: Self-pay | Admitting: Adult Health

## 2017-04-25 MED ORDER — CLINDAMYCIN HCL 300 MG PO CAPS: ORAL_CAPSULE | ORAL | 0 refills | Status: DC

## 2017-08-03 ENCOUNTER — Other Ambulatory Visit: Payer: Self-pay | Admitting: Adult Health

## 2017-08-03 MED ORDER — CLINDAMYCIN HCL 300 MG PO CAPS: ORAL_CAPSULE | ORAL | 0 refills | Status: DC

## 2017-08-03 NOTE — Telephone Encounter (Signed)
Sent to the pharmacy by e-scribe. 

## 2017-08-03 NOTE — Telephone Encounter (Signed)
Ok to refill 

## 2017-09-21 ENCOUNTER — Telehealth: Payer: Self-pay | Admitting: Adult Health

## 2017-09-21 DIAGNOSIS — I10 Essential (primary) hypertension: Secondary | ICD-10-CM

## 2017-09-21 MED ORDER — METOPROLOL TARTRATE 50 MG PO TABS: 50.0000 mg | ORAL_TABLET | Freq: Two times a day (BID) | ORAL | 0 refills | Status: DC

## 2017-09-21 NOTE — Telephone Encounter (Signed)
Sent to the pharmacy by e-scribe.  Spoke to pharmacy.  They had a computer upgrade last night.  Not sure what happened to prescription.

## 2017-09-21 NOTE — Telephone Encounter (Signed)
Copied from [place] [id number]. Topic: Quick Communication - Rx Refill/Question >> Sep 21, 2017 10:03 AM Ahmed Prima L wrote: Medication: metoprolol tartrate (LOPRESSOR) 50 MG tablet  Has the patient contacted their pharmacy? Yes and the pharmacy told her to call her pcp because they are having issues (Agent: If no, request that the patient contact the pharmacy for the refill.) (Agent: If yes, when and what did the pharmacy advise?) today  Preferred Pharmacy (with phone number or street name): [facility] [place], [place] [address] [place] [address] Phone: [phone] Fax: [phone]    Agent: Please be advised that RX refills may take up to 3 business days. We ask that you follow-up with your pharmacy.

## 2017-12-16 ENCOUNTER — Other Ambulatory Visit: Payer: Self-pay | Admitting: Adult Health

## 2017-12-16 DIAGNOSIS — I10 Essential (primary) hypertension: Secondary | ICD-10-CM

## 2017-12-19 ENCOUNTER — Other Ambulatory Visit: Payer: Self-pay | Admitting: Family Medicine

## 2017-12-19 MED ORDER — CLINDAMYCIN HCL 300 MG PO CAPS: ORAL_CAPSULE | ORAL | 0 refills | Status: DC

## 2017-12-19 NOTE — Telephone Encounter (Signed)
Ok to refill 

## 2017-12-19 NOTE — Telephone Encounter (Signed)
Pt states she needs a refill for dental work..  Please advise.

## 2017-12-19 NOTE — Telephone Encounter (Signed)
Sent to the pharmacy by e-scribe. 

## 2017-12-20 ENCOUNTER — Encounter: Payer: Self-pay | Admitting: Adult Health

## 2017-12-20 ENCOUNTER — Other Ambulatory Visit: Payer: Self-pay | Admitting: Adult Health

## 2017-12-20 NOTE — Telephone Encounter (Signed)
Medication filled on 12/19/17

## 2018-01-16 ENCOUNTER — Other Ambulatory Visit: Payer: Self-pay | Admitting: Adult Health

## 2018-01-16 DIAGNOSIS — I10 Essential (primary) hypertension: Secondary | ICD-10-CM

## 2018-01-17 NOTE — Telephone Encounter (Signed)
Sent to the pharmacy by e-scribe.  Pt is scheduled for cpx on 04/26/17

## 2018-02-27 ENCOUNTER — Encounter: Payer: Self-pay | Admitting: Adult Health

## 2018-02-27 DIAGNOSIS — I10 Essential (primary) hypertension: Secondary | ICD-10-CM

## 2018-02-27 MED ORDER — METOPROLOL TARTRATE 50 MG PO TABS: ORAL_TABLET | ORAL | 0 refills | Status: DC

## 2018-03-02 ENCOUNTER — Other Ambulatory Visit: Payer: Self-pay | Admitting: Adult Health

## 2018-03-02 MED ORDER — CLINDAMYCIN HCL 300 MG PO CAPS: ORAL_CAPSULE | ORAL | 0 refills | Status: DC

## 2018-03-07 ENCOUNTER — Encounter: Payer: Medicare Other | Admitting: Adult Health

## 2018-03-10 ENCOUNTER — Encounter: Payer: Self-pay | Admitting: Adult Health

## 2018-03-16 ENCOUNTER — Other Ambulatory Visit: Payer: Self-pay | Admitting: Adult Health

## 2018-03-16 DIAGNOSIS — I10 Essential (primary) hypertension: Secondary | ICD-10-CM

## 2018-03-16 MED ORDER — BENAZEPRIL-HYDROCHLOROTHIAZIDE 20-25 MG PO TABS: 1.0000 | ORAL_TABLET | Freq: Every day | ORAL | 0 refills | Status: DC

## 2018-03-16 MED ORDER — METOPROLOL TARTRATE 50 MG PO TABS: ORAL_TABLET | ORAL | 0 refills | Status: DC

## 2018-04-20 ENCOUNTER — Other Ambulatory Visit: Payer: Self-pay | Admitting: Gynecology

## 2018-04-20 DIAGNOSIS — Z1231 Encounter for screening mammogram for malignant neoplasm of breast: Secondary | ICD-10-CM

## 2018-04-27 ENCOUNTER — Encounter: Payer: Medicare Other | Admitting: Adult Health

## 2018-04-27 NOTE — Progress Notes (Deleted)
Subjective:    Patient ID: Allison Short, female    DOB: 1950/06/29, 68 y.o.   MRN: [id number]  HPI Patient presents for yearly preventative medicine examination. She is a pleasant 68 year old female who  has a past medical history of History of blood transfusion, History of urinary tract infection, Hypertension, Non Hodgkin's lymphoma ([place]) (2008), Obesity, Osteopenia (04/2017), and Pancreatitis.  Essential Hypertension -prescribed Lotensin HCT and metoprolol 50 mg tablet twice daily.  She denies chest pain, shortness of breath, headaches, dizziness, or blurred vision BP Readings from Last 3 Encounters:  04/12/17 (!) 150/80  04/07/17 134/84  07/09/15 152/69    All immunizations and health maintenance protocols were reviewed with the patient and needed orders were placed.  Appropriate screening laboratory values were ordered for the patient including screening of hyperlipidemia, renal function and hepatic function.  Medication reconciliation,  past medical history, social history, problem list and allergies were reviewed in detail with the patient  Goals were established with regard to weight loss, exercise, and  diet in compliance with medications  End of life planning was discussed.  She is up to date on routine screening I am such as dental care, mammograms, and colonoscopy.  She does not see an eye doctor on a regular basis.  She does perform home breast exams and has not noticed any changes in her breasts   Review of Systems  Constitutional: Negative.   HENT: Negative.   Eyes: Negative.   Respiratory: Negative.   Cardiovascular: Negative.   Gastrointestinal: Negative.   Endocrine: Negative.   Genitourinary: Negative.   Musculoskeletal: Negative.   Skin: Negative.   Allergic/Immunologic: Negative.   Neurological: Negative.   Hematological: Negative.   Psychiatric/Behavioral: Negative.   All other systems reviewed and are negative.      Objective:   Physical  Exam Vitals signs and nursing note reviewed.  Constitutional:      General: She is not in acute distress.    Appearance: Normal appearance. She is well-developed.  HENT:     Head: Normocephalic and atraumatic.     Right Ear: Tympanic membrane, ear canal and external ear normal. There is no impacted cerumen.     Left Ear: Tympanic membrane, ear canal and external ear normal. There is no impacted cerumen.     Nose: Nose normal.     Mouth/Throat:     Mouth: Mucous membranes are moist.     Pharynx: Oropharynx is clear. No oropharyngeal exudate.  Eyes:     General: No scleral icterus.       Right eye: No discharge.        Left eye: No discharge.     Extraocular Movements: Extraocular movements intact.     Conjunctiva/sclera: Conjunctivae normal.     Pupils: Pupils are equal, round, and reactive to light.  Neck:     Musculoskeletal: Normal range of motion and neck supple. No neck rigidity or muscular tenderness.     Thyroid: No thyromegaly.     Vascular: No carotid bruit.     Trachea: No tracheal deviation.  Cardiovascular:     Rate and Rhythm: Normal rate and regular rhythm.     Pulses: Normal pulses.     Heart sounds: Normal heart sounds. No murmur. No friction rub. No gallop.   Pulmonary:     Effort: Pulmonary effort is normal. No respiratory distress.     Breath sounds: Normal breath sounds. No stridor. No wheezing, rhonchi or rales.  Chest:  Chest wall: No tenderness.  Abdominal:     General: Bowel sounds are normal. There is no distension.     Palpations: Abdomen is soft. There is no mass.     Tenderness: There is no abdominal tenderness. There is no right CVA tenderness, left CVA tenderness, guarding or rebound.     Hernia: No hernia is present.  Musculoskeletal: Normal range of motion.        General: No swelling, tenderness, deformity or signs of injury.     Right lower leg: No edema.     Left lower leg: No edema.  Lymphadenopathy:     Cervical: No cervical  adenopathy.  Skin:    General: Skin is warm and dry.     Coloration: Skin is not jaundiced or pale.     Findings: No bruising, erythema, lesion or rash.  Neurological:     General: No focal deficit present.     Mental Status: She is alert and oriented to person, place, and time.     Cranial Nerves: No cranial nerve deficit.     Sensory: No sensory deficit.     Motor: No weakness.     Coordination: Coordination normal.     Gait: Gait normal.     Deep Tendon Reflexes: Reflexes normal.  Psychiatric:        Mood and Affect: Mood normal.        Behavior: Behavior normal.        Thought Content: Thought content normal.        Judgment: Judgment normal.           Assessment & Plan:

## 2018-04-30 ENCOUNTER — Encounter: Payer: Self-pay | Admitting: Adult Health

## 2018-05-02 ENCOUNTER — Encounter: Payer: Medicare Other | Admitting: Adult Health

## 2018-05-14 ENCOUNTER — Encounter: Payer: Medicare Other | Admitting: Gynecology

## 2018-05-17 ENCOUNTER — Encounter: Payer: Medicare Other | Admitting: Adult Health

## 2018-05-22 ENCOUNTER — Ambulatory Visit: Payer: Medicare Other

## 2018-05-31 ENCOUNTER — Encounter: Payer: Medicare Other | Admitting: Adult Health

## 2018-06-12 ENCOUNTER — Ambulatory Visit: Payer: Medicare Other

## 2018-06-13 ENCOUNTER — Encounter: Payer: Medicare Other | Admitting: Adult Health

## 2018-06-18 ENCOUNTER — Encounter: Payer: Medicare Other | Admitting: Gynecology

## 2018-08-09 ENCOUNTER — Other Ambulatory Visit: Payer: Self-pay | Admitting: Adult Health

## 2018-08-09 DIAGNOSIS — I10 Essential (primary) hypertension: Secondary | ICD-10-CM

## 2018-08-09 NOTE — Telephone Encounter (Signed)
Sent to the pharmacy by e-scribe.  Pt has upcoming appt in July 2020.

## 2018-09-12 ENCOUNTER — Encounter: Payer: Medicare Other | Admitting: Gynecology

## 2018-09-18 ENCOUNTER — Encounter: Payer: Medicare Other | Admitting: Adult Health

## 2018-10-19 ENCOUNTER — Encounter: Payer: Medicare Other | Admitting: Adult Health

## 2018-10-30 ENCOUNTER — Encounter: Payer: Medicare Other | Admitting: Gynecology

## 2018-11-01 ENCOUNTER — Telehealth: Payer: Self-pay | Admitting: Adult Health

## 2018-11-01 DIAGNOSIS — I10 Essential (primary) hypertension: Secondary | ICD-10-CM

## 2018-11-02 NOTE — Telephone Encounter (Signed)
DENIED.  PT IS PAST DUE FOR CPX. 

## 2018-11-07 MED ORDER — METOPROLOL TARTRATE 50 MG PO TABS: ORAL_TABLET | ORAL | 0 refills | Status: DC

## 2018-11-07 MED ORDER — BENAZEPRIL-HYDROCHLOROTHIAZIDE 20-25 MG PO TABS: 1.0000 | ORAL_TABLET | Freq: Every day | ORAL | 0 refills | Status: DC

## 2018-11-07 NOTE — Telephone Encounter (Signed)
Rx was sent in. Patient is aware.

## 2018-11-07 NOTE — Addendum Note (Signed)
Addended by: Rebecca Eaton on: 11/07/2018 04:15 PM   Modules accepted: Orders

## 2018-11-07 NOTE — Telephone Encounter (Signed)
Ok to send in 30 days  

## 2018-11-07 NOTE — Telephone Encounter (Signed)
Appointment is on 11/20/2018. Both last filled 08/09/2018 for 90 days.  Ok to send refills?

## 2018-11-07 NOTE — Telephone Encounter (Signed)
Pt want to know what is she suppose to do if she run out of  meds before her appt

## 2018-11-20 ENCOUNTER — Ambulatory Visit (INDEPENDENT_AMBULATORY_CARE_PROVIDER_SITE_OTHER): Payer: Medicare Other | Admitting: Adult Health

## 2018-11-20 ENCOUNTER — Other Ambulatory Visit: Payer: Self-pay

## 2018-11-20 ENCOUNTER — Encounter: Payer: Self-pay | Admitting: Adult Health

## 2018-11-20 ENCOUNTER — Other Ambulatory Visit: Payer: Self-pay | Admitting: Adult Health

## 2018-11-20 VITALS — BP 144/90 | Temp 97.8°F | Wt 371.0 lb

## 2018-11-20 DIAGNOSIS — I10 Essential (primary) hypertension: Secondary | ICD-10-CM | POA: Diagnosis not present

## 2018-11-20 DIAGNOSIS — F339 Major depressive disorder, recurrent, unspecified: Secondary | ICD-10-CM

## 2018-11-20 DIAGNOSIS — R7309 Other abnormal glucose: Secondary | ICD-10-CM | POA: Diagnosis not present

## 2018-11-20 LAB — LIPID PANEL
Cholesterol: 141 mg/dL (ref 0–200)
HDL: 42.6 mg/dL (ref >39.00)
LDL Cholesterol: 82 mg/dL (ref 0–99)
NonHDL: 98.16
Total CHOL/HDL Ratio: 3
Triglycerides: 81 mg/dL (ref 0.0–149.0)
VLDL: 16.2 mg/dL (ref 0.0–40.0)

## 2018-11-20 LAB — CBC WITH DIFFERENTIAL/PLATELET
Basophils Absolute: 0 10*3/uL (ref 0.0–0.1)
Basophils Relative: 0.4 % (ref 0.0–3.0)
Eosinophils Absolute: 0.2 10*3/uL (ref 0.0–0.7)
Eosinophils Relative: 2 % (ref 0.0–5.0)
HCT: 40.9 % (ref 36.0–46.0)
Hemoglobin: 13.6 g/dL (ref 12.0–15.0)
Lymphocytes Relative: 14.4 % (ref 12.0–46.0)
Lymphs Abs: 1.3 10*3/uL (ref 0.7–4.0)
MCHC: 33.2 g/dL (ref 30.0–36.0)
MCV: 96.6 fl (ref 78.0–100.0)
Monocytes Absolute: 0.5 10*3/uL (ref 0.1–1.0)
Monocytes Relative: 5.6 % (ref 3.0–12.0)
Neutro Abs: 7.2 10*3/uL (ref 1.4–7.7)
Neutrophils Relative %: 77.6 % — ABNORMAL HIGH (ref 43.0–77.0)
Platelets: 268 10*3/uL (ref 150.0–400.0)
RBC: 4.23 Mil/uL (ref 3.87–5.11)
RDW: 14.7 % (ref 11.5–15.5)
WBC: 9.2 10*3/uL (ref 4.0–10.5)

## 2018-11-20 LAB — COMPREHENSIVE METABOLIC PANEL
ALT: 12 U/L (ref 0–35)
AST: 11 U/L (ref 0–37)
Albumin: 4.3 g/dL (ref 3.5–5.2)
Alkaline Phosphatase: 75 U/L (ref 39–117)
BUN: 22 mg/dL (ref 6–23)
CO2: 27 mEq/L (ref 19–32)
Calcium: 9.6 mg/dL (ref 8.4–10.5)
Chloride: 97 mEq/L (ref 96–112)
Creatinine, Ser: 1.06 mg/dL (ref 0.40–1.20)
GFR: 51.2 mL/min — ABNORMAL LOW (ref >60.00)
Glucose, Bld: 142 mg/dL — ABNORMAL HIGH (ref 70–99)
Potassium: 3.8 mEq/L (ref 3.5–5.1)
Sodium: 138 mEq/L (ref 135–145)
Total Bilirubin: 0.9 mg/dL (ref 0.2–1.2)
Total Protein: 7.4 g/dL (ref 6.0–8.3)

## 2018-11-20 LAB — TSH: TSH: 1.42 u[IU]/mL (ref 0.35–4.50)

## 2018-11-20 LAB — HEMOGLOBIN A1C: Hgb A1c MFr Bld: 6.2 % (ref 4.6–6.5)

## 2018-11-20 MED ORDER — METOPROLOL TARTRATE 50 MG PO TABS: ORAL_TABLET | ORAL | 3 refills | Status: DC

## 2018-11-20 MED ORDER — BUPROPION HCL ER (SR) 150 MG PO TB12: 150.0000 mg | ORAL_TABLET | Freq: Two times a day (BID) | ORAL | 0 refills | Status: DC

## 2018-11-20 MED ORDER — BENAZEPRIL-HYDROCHLOROTHIAZIDE 20-25 MG PO TABS: 1.0000 | ORAL_TABLET | Freq: Every day | ORAL | 3 refills | Status: DC

## 2018-11-20 NOTE — Progress Notes (Signed)
Subjective:    Patient ID: Allison Short, female    DOB: 01-Jul-1950, 68 y.o.   MRN: [id number]  HPI  Patient presents for yearly preventative medicine examination. She is a pleasant 68 year old female who  has a past medical history of History of blood transfusion, History of urinary tract infection, Hypertension, Non Hodgkin's lymphoma ([place]) (2008), Obesity, Osteopenia (04/2017), and Pancreatitis.  Hypertension-she is currently prescribed Lotensin HCT and metoprolol 50 mg daily.  She denies dizziness, lightheadedness, syncopal episodes, chest pain, shortness of breath.  BP Readings from Last 3 Encounters:  11/20/18 (!) 144/90  04/12/17 (!) 150/80  04/07/17 134/84   H/o Non Hodgkins lymphoma -in 2008 she went through chemo and radiation as a prophylactic measure.  She was followed by oncology for 5 years after that and then discharged.  She is asymptomatic  All immunizations and health maintenance protocols were reviewed with the patient and needed orders were placed. Refuses pneumonia vaccinations.   Appropriate screening laboratory values were ordered for the patient including screening of hyperlipidemia, renal function and hepatic function.  Medication reconciliation,  past medical history, social history, problem list and allergies were reviewed in detail with the patient  Goals were established with regard to weight loss, exercise, and  diet in compliance with medications. She has not been exercising and her diet has been poor, she is stress eating. Her weight is up 35 pounds since February. She does report more depression since she has not been able to get out of her house and with the current political climate. She denies SI, is interested in starting medication for short term   Wt Readings from Last 3 Encounters:  11/20/18 (!) 371 lb (168.3 kg)  04/12/17 (!) 336 lb (152.4 kg)  04/07/17 (!) 333 lb (151 kg)   She is up-to-date on routine dental care.  Is followed by Dr. Phineas Real for  GYN care, she had a mammogram and bone density in February 2019  Review of Systems  Constitutional: Negative.   HENT: Negative.   Eyes: Negative.   Respiratory: Negative.   Cardiovascular: Negative.   Gastrointestinal: Negative.   Endocrine: Negative.   Genitourinary: Negative.   Musculoskeletal: Positive for arthralgias and back pain.  Skin: Negative.   Allergic/Immunologic: Negative.   Neurological: Negative.   Hematological: Negative.   Psychiatric/Behavioral: Positive for dysphoric mood.   Past Medical History:  Diagnosis Date  . History of blood transfusion    2016  . History of urinary tract infection   . Hypertension   . Non Hodgkin's lymphoma ([place]) 2008  . Obesity   . Osteopenia 04/2017   T score -1.4 FRAX 7.5% / 0.8%  . Pancreatitis     Social History   Socioeconomic History  . Marital status: Divorced    Spouse name: Not on file  . Number of children: Not on file  . Years of education: Not on file  . Highest education level: Not on file  Occupational History  . Not on file  Social Needs  . Financial resource strain: Not on file  . Food insecurity    Worry: Not on file    Inability: Not on file  . Transportation needs    Medical: Not on file    Non-medical: Not on file  Tobacco Use  . Smoking status: Never Smoker  . Smokeless tobacco: Never Used  Substance and Sexual Activity  . Alcohol use: Yes    Alcohol/week: 0.0 standard drinks    Comment: Occas  .  Drug use: No  . Sexual activity: Not Currently    Comment: 1st intercourse 15 yo-5 partners  Lifestyle  . Physical activity    Days per week: Not on file    Minutes per session: Not on file  . Stress: Not on file  Relationships  . Social connections    Talks on phone: Not on file    Gets together: Not on file    Attends religious service: Not on file    Active member of club or organization: Not on file    Attends meetings of clubs or organizations: Not on file    Relationship status: Not  on file  . Intimate partner violence    Fear of current or ex partner: Not on file    Emotionally abused: Not on file    Physically abused: Not on file    Forced sexual activity: Not on file  Other Topics Concern  . Not on file  Social History Narrative  . Not on file    Past Surgical History:  Procedure Laterality Date  . BIOPSY THYROID    . BONE MARROW BIOPSY    . CESAREAN SECTION  10/26/1975  . CHOLECYSTECTOMY     2006  . PORTA CATH INSERTION    . TONSILLECTOMY AND ADENOIDECTOMY     1952  . TOTAL KNEE ARTHROPLASTY Bilateral   . 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      Family History  Problem Relation Age of Onset  . Arthritis Maternal Grandmother     Allergies  Allergen Reactions  . Ciprofloxin Hcl [Ciprofloxacin] Other (See Comments)    Unstable gait  . Morphine And Related Anaphylaxis  . Asa [Aspirin] Hives  . Codeine Hives  . Nsaids     Lowers GFR  . Penicillins Other (See Comments)    Patient does not know      Current Outpatient Medications on File Prior to Visit  Medication Sig Dispense Refill  . acetaminophen (TYLENOL) 500 MG tablet Take 500 mg by mouth every 6 (six) hours as needed for moderate pain.     . benazepril-hydrochlorthiazide (LOTENSIN HCT) 20-25 MG tablet Take 1 tablet by mouth daily. 30 tablet 0  . budesonide (RHINOCORT AQUA) 32 MCG/ACT nasal spray Place 2 sprays into both nostrils daily. 8.6 g 3  . Cholecalciferol (VITAMIN D PO) Take 1,000 Units by mouth daily.     . metoprolol tartrate (LOPRESSOR) 50 MG tablet TAKE 1 TABLET TWICE DAILY 60 tablet 0  . clindamycin (CLEOCIN) 300 MG capsule Take 3 tablets one hour before dental work as needed. (Patient not taking: Reported on 11/20/2018) 3 capsule 0   No current facility-administered medications on file prior to visit.     BP (!) 144/90   Temp 97.8 F (36.6 C) (Temporal)   Wt (!) 371 lb (168.3 kg)   BMI 59.88 kg/m       Objective:   Physical Exam Vitals signs and nursing note reviewed.   Constitutional:      General: She is not in acute distress.    Appearance: She is obese. She is not diaphoretic.  HENT:     Head: Normocephalic and atraumatic.     Right Ear: Tympanic membrane, ear canal and external ear normal. There is no impacted cerumen.     Left Ear: Tympanic membrane, ear canal and external ear normal. There is no impacted cerumen.     Nose: Nose normal. No congestion or rhinorrhea.     Mouth/Throat:       Mouth: Mucous membranes are moist.     Pharynx: Oropharynx is clear. No oropharyngeal exudate or posterior oropharyngeal erythema.  Eyes:     General: No scleral icterus.       Right eye: No discharge.        Left eye: No discharge.     Conjunctiva/sclera: Conjunctivae normal.     Pupils: Pupils are equal, round, and reactive to light.  Neck:     Musculoskeletal: Normal range of motion and neck supple.     Thyroid: No thyromegaly.     Vascular: No JVD.     Trachea: No tracheal deviation.  Cardiovascular:     Rate and Rhythm: Normal rate and regular rhythm.     Pulses: Normal pulses.     Heart sounds: Normal heart sounds. No murmur. No friction rub. No gallop.   Pulmonary:     Effort: Pulmonary effort is normal. No respiratory distress.     Breath sounds: Normal breath sounds. No stridor. No wheezing or rales.  Chest:     Chest wall: No tenderness.  Abdominal:     General: Bowel sounds are normal. There is no distension.     Palpations: Abdomen is soft. There is no mass.     Tenderness: There is no abdominal tenderness. There is no right CVA tenderness, left CVA tenderness, guarding or rebound.     Hernia: No hernia is present.  Musculoskeletal: Normal range of motion.        General: No swelling, tenderness, deformity or signs of injury.     Right lower leg: No edema.     Left lower leg: No edema.  Lymphadenopathy:     Cervical: No cervical adenopathy.  Skin:    General: Skin is warm and dry.     Capillary Refill: Capillary refill takes less than 2  seconds.     Coloration: Skin is not jaundiced or pale.     Findings: No bruising, erythema, lesion or rash.  Neurological:     General: No focal deficit present.     Mental Status: She is alert and oriented to person, place, and time. Mental status is at baseline.     Cranial Nerves: No cranial nerve deficit.     Sensory: No sensory deficit.     Motor: No weakness or abnormal muscle tone.     Coordination: Coordination normal.     Deep Tendon Reflexes: Reflexes normal.  Psychiatric:        Mood and Affect: Mood normal.        Behavior: Behavior normal.        Thought Content: Thought content normal.        Judgment: Judgment normal.       Assessment & Plan:  1. Essential hypertension - BP up today - Will continue to monitor  - CBC with Differential/Platelet - Comprehensive metabolic panel - TSH - Lipid panel - benazepril-hydrochlorthiazide (LOTENSIN HCT) 20-25 MG tablet; Take 1 tablet by mouth daily.  Dispense: 90 tablet; Refill: 3 - metoprolol tartrate (LOPRESSOR) 50 MG tablet; TAKE 1 TABLET TWICE DAILY  Dispense: 180 tablet; Refill: 3  2. Depression, recurrent (HCC) - Follow up in 30 days or sooner if needed - buPROPion (WELLBUTRIN SR) 150 MG 12 hr tablet; Take 1 tablet (150 mg total) by mouth 2 (two) times daily.  Dispense: 180 tablet; Refill: 0  3. Morbid obesity (HCC) - encouraged Mediterranean diet  - Hopefully wellbutrin will help with stress eating  - CBC with Differential/Platelet -   Comprehensive metabolic panel - TSH - Lipid panel   Dorothyann Peng, NP

## 2018-11-20 NOTE — Addendum Note (Signed)
Addended by: Elmer Picker on: 11/20/2018 02:15 PM   Modules accepted: Orders

## 2018-11-28 ENCOUNTER — Encounter: Payer: Self-pay | Admitting: Gynecology

## 2018-12-24 ENCOUNTER — Telehealth: Payer: Self-pay | Admitting: *Deleted

## 2018-12-24 NOTE — Telephone Encounter (Signed)
Copied from [place] [id number]. Topic: General - Other >> Dec 24, 2018 12:07 PM Burchel, Abbi R wrote: Reason for CRM: Pt requesting call back from [facility] or his nurse re: medication update.  Please call pt

## 2018-12-25 ENCOUNTER — Encounter: Payer: Self-pay | Admitting: Adult Health

## 2018-12-25 NOTE — Telephone Encounter (Signed)
Pt scheduled for Wellbutrin follow up.  Nothing further needed.

## 2018-12-26 ENCOUNTER — Ambulatory Visit (INDEPENDENT_AMBULATORY_CARE_PROVIDER_SITE_OTHER): Payer: Medicare Other | Admitting: Adult Health

## 2018-12-26 ENCOUNTER — Other Ambulatory Visit: Payer: Self-pay

## 2018-12-26 ENCOUNTER — Encounter: Payer: Self-pay | Admitting: Adult Health

## 2018-12-26 DIAGNOSIS — F339 Major depressive disorder, recurrent, unspecified: Secondary | ICD-10-CM | POA: Diagnosis not present

## 2018-12-26 NOTE — Progress Notes (Signed)
Virtual Visit via Telephone Note  I connected with Allison Short on 12/26/18 at 10:00 AM EST by telephone and verified that I am speaking with the correct person using two identifiers.   I discussed the limitations, risks, security and privacy concerns of performing an evaluation and management service by telephone and the availability of in person appointments. I also discussed with the patient that there may be a patient responsible charge related to this service. The patient expressed understanding and agreed to proceed.  Location patient: home Location provider: work or home office Participants present for the call: patient, provider Patient did not have a visit in the prior 7 days to address this/these issue(s).   History of Present Illness: This 67 year old female who is being evaluated today for depression and morbid obesity.  1 month ago he was started on Wellbutrin 150 mg twice daily.  At this time she was more depressed due to the political climate and she was not able to get out of her house.  She had been stress eating and her weight was up 35 pounds.  The day she reports that over the last month she has started the [place] and is taking her medication as directed.  Overall she feels "a lot better".  She is no longer stress eating and she no longer feels hungry between meals.  She is eating a lot of fruits and vegetables as well as fish.  She reports that her depression has improved and she is enjoying life once again.  He denies side effects of Wellbutrin   Observations/Objective: Patient sounds cheerful and well on the phone. I do not appreciate any SOB. Speech and thought processing are grossly intact. Patient reported vitals:  Assessment and Plan: She was congratulated on making extensive lifestyle modifications.  I am going to keep her on Wellbutrin 150 mg twice daily.  She would like to follow-up towards the end of the year to check her weight as well as check  blood pressure readings.  Follow Up Instructions:  I did not refer this patient for an OV in the next 24 hours for this/these issue(s).  I discussed the assessment and treatment plan with the patient. The patient was provided an opportunity to ask questions and all were answered. The patient agreed with the plan and demonstrated an understanding of the instructions.   The patient was advised to call back or seek an in-person evaluation if the symptoms worsen or if the condition fails to improve as anticipated.  I provided 16 minutes of non-face-to-face time during this encounter.   Dorothyann Peng, NP

## 2019-02-06 ENCOUNTER — Other Ambulatory Visit: Payer: Self-pay | Admitting: Adult Health

## 2019-02-06 ENCOUNTER — Encounter: Payer: Self-pay | Admitting: Adult Health

## 2019-02-06 DIAGNOSIS — I10 Essential (primary) hypertension: Secondary | ICD-10-CM

## 2019-02-06 DIAGNOSIS — F339 Major depressive disorder, recurrent, unspecified: Secondary | ICD-10-CM

## 2019-03-06 ENCOUNTER — Ambulatory Visit: Payer: Medicare Other | Admitting: Adult Health

## 2019-03-20 ENCOUNTER — Ambulatory Visit: Payer: Medicare Other | Admitting: Adult Health

## 2019-04-03 ENCOUNTER — Ambulatory Visit: Payer: Medicare Other | Admitting: Adult Health

## 2019-06-21 ENCOUNTER — Other Ambulatory Visit: Payer: Self-pay

## 2019-06-24 ENCOUNTER — Ambulatory Visit (INDEPENDENT_AMBULATORY_CARE_PROVIDER_SITE_OTHER): Payer: Medicare Other | Admitting: Obstetrics and Gynecology

## 2019-06-24 ENCOUNTER — Other Ambulatory Visit: Payer: Self-pay

## 2019-06-24 ENCOUNTER — Encounter: Payer: Self-pay | Admitting: Obstetrics and Gynecology

## 2019-06-24 VITALS — BP 136/88 | Ht 68.0 in | Wt 346.0 lb

## 2019-06-24 DIAGNOSIS — Z9189 Other specified personal risk factors, not elsewhere classified: Secondary | ICD-10-CM

## 2019-06-24 DIAGNOSIS — M8588 Other specified disorders of bone density and structure, other site: Secondary | ICD-10-CM

## 2019-06-24 DIAGNOSIS — N950 Postmenopausal bleeding: Secondary | ICD-10-CM

## 2019-06-24 DIAGNOSIS — Z01419 Encounter for gynecological examination (general) (routine) without abnormal findings: Secondary | ICD-10-CM

## 2019-06-24 NOTE — Progress Notes (Signed)
Jamariya Gastineau 1951/01/05 [id number]  SUBJECTIVE:  69 y.o. G1P1 female here for annual routine gynecologic exam and Pap smear.  Reports several episodes of slight spotting in this last month.  Never has had spotting before.  No heavy bleeding or flow at any point.  Mostly just occurs with wiping, just enough to spot a pad one time.  She says about 4 years ago she had an ultrasound in [place] which showed thickening of the endometrium and had a D&C which was reportedly negative.  She has not been on any hormone therapy. Celebate for many years since early 90s, does masturbate periodically, but more recently has been daily, orgasm has been more intense as of late.   Trying Mediterranean diet and has lost 25 lbs.  Moved down here from [place] to be near son and granddaughters. She has no other gynecologic concerns.  Does require examination on the lift table due to mobility limitations.  Current Outpatient Medications  Medication Sig Dispense Refill  . acetaminophen (TYLENOL) 500 MG tablet Take 500 mg by mouth every 6 (six) hours as needed for moderate pain.     . benazepril-hydrochlorthiazide (LOTENSIN HCT) 20-25 MG tablet Take 1 tablet by mouth daily. 90 tablet 3  . buPROPion (WELLBUTRIN SR) 150 MG 12 hr tablet TAKE 1 TABLET TWICE DAILY 180 tablet 1  . Cholecalciferol (VITAMIN D PO) Take 1,000 Units by mouth daily.     . metoprolol tartrate (LOPRESSOR) 50 MG tablet TAKE 1 TABLET TWICE DAILY 180 tablet 3   No current facility-administered medications for this visit.   Allergies: Ciprofloxin hcl [ciprofloxacin], Morphine and related, Asa [aspirin], Codeine, Nsaids, and Penicillins  No LMP recorded. Patient is postmenopausal.  Past medical history,surgical history, problem list, medications, allergies, family history and social history were all reviewed and documented as reviewed in the EPIC chart.  ROS:  Feeling well. No dyspnea or chest pain on exertion.  No abdominal pain, change in bowel habits,  black or bloody stools.  No urinary tract symptoms. GYN ROS: +postmenopausal bleeding, no pelvic pain or discharge, no breast pain or new or enlarging lumps on self exam. No neurological complaints.   OBJECTIVE:  BP 136/88 (Cuff Size: Large)   Ht 5\' 8"  (1.727 m)   Wt (!) 346 lb (156.9 kg)   BMI 52.61 kg/m  The patient appears well, alert, oriented x 3, in no distress. ENT normal.  Neck supple. No cervical or supraclavicular adenopathy or thyromegaly.  Lungs are clear, good air entry, no wheezes, rhonchi or rales. S1 and S2 normal, no murmurs, regular rate and rhythm.  Abdomen soft without tenderness, guarding, mass or organomegaly.  Neurological is normal, no focal findings.  BREAST EXAM: breasts appear normal, no suspicious masses, no skin or nipple changes or axillary nodes  PELVIC EXAM: VULVA: normal appearing vulva with no masses, tenderness or lesions, VAGINA: normal appearing vagina with normal color and discharge, no lesions, 2 tsp dark red blood in upper vagina, CERVIX: extra large speculum utilized, normal appearing cervix without discharge or lesions, no active bleeding, UTERUS and ADNEXA: Unable to examine sufficiently due to body habitus  Chaperone: Caryn Bee present during the examination  ASSESSMENT:  69 y.o. G1P1 here for annual gynecologic exam  PLAN:   1. Postmenopausal.  No significant vasomotor symptoms.  She is having vaginal bleeding, and we will further work this up by having her schedule a sonohysterogram which is ordered. 2. Pap smear 03/2017.   Next Pap smear due 2022 following  the current guidelines recommending the 3 year interval. 3. Mammogram 03/2017.  Normal breast exam today.  She is reminded to schedule an annual mammogram this year as this is overdue. 4. Colonoscopy 2011.  Colonoscopy appears due so she will plan to get this scheduled. 5. Osteopenia. DEXA 03/2017, T score -1.4.  I would recommend rechecking a DEXA either this year or next.   6. Health  maintenance.  No labs today as she normally has these completed with her primary care provider.   Return annually or sooner, prn.  Joseph Pierini MD 06/24/19

## 2019-06-27 ENCOUNTER — Other Ambulatory Visit: Payer: Medicare Other

## 2019-06-27 ENCOUNTER — Ambulatory Visit: Payer: Medicare Other | Admitting: Obstetrics and Gynecology

## 2019-07-02 ENCOUNTER — Other Ambulatory Visit: Payer: Self-pay

## 2019-07-04 ENCOUNTER — Ambulatory Visit: Payer: Medicare Other | Admitting: Obstetrics and Gynecology

## 2019-07-04 ENCOUNTER — Encounter: Payer: Self-pay | Admitting: Obstetrics and Gynecology

## 2019-07-04 ENCOUNTER — Other Ambulatory Visit: Payer: Medicare Other

## 2019-07-12 ENCOUNTER — Other Ambulatory Visit: Payer: Self-pay

## 2019-07-15 ENCOUNTER — Encounter: Payer: Self-pay | Admitting: Obstetrics and Gynecology

## 2019-07-15 ENCOUNTER — Ambulatory Visit: Payer: Medicare Other | Admitting: Obstetrics and Gynecology

## 2019-07-15 ENCOUNTER — Other Ambulatory Visit: Payer: Medicare Other

## 2019-08-10 ENCOUNTER — Other Ambulatory Visit: Payer: Self-pay | Admitting: Adult Health

## 2019-08-10 DIAGNOSIS — F339 Major depressive disorder, recurrent, unspecified: Secondary | ICD-10-CM

## 2019-08-11 ENCOUNTER — Encounter: Payer: Self-pay | Admitting: Adult Health

## 2019-08-13 ENCOUNTER — Encounter: Payer: Self-pay | Admitting: Adult Health

## 2019-08-13 DIAGNOSIS — I10 Essential (primary) hypertension: Secondary | ICD-10-CM

## 2019-08-13 NOTE — Telephone Encounter (Signed)
SENT TO THE PHARMACY BY E-SCRIBE. 

## 2019-09-11 ENCOUNTER — Telehealth: Payer: Medicare Other | Admitting: Obstetrics & Gynecology

## 2019-11-08 ENCOUNTER — Encounter: Payer: Self-pay | Admitting: Adult Health

## 2019-11-08 ENCOUNTER — Other Ambulatory Visit: Payer: Self-pay | Admitting: Adult Health

## 2019-11-08 DIAGNOSIS — I10 Essential (primary) hypertension: Secondary | ICD-10-CM

## 2019-11-11 ENCOUNTER — Encounter: Payer: Self-pay | Admitting: Adult Health

## 2019-11-12 ENCOUNTER — Encounter: Payer: Self-pay | Admitting: Adult Health

## 2019-11-12 ENCOUNTER — Telehealth (INDEPENDENT_AMBULATORY_CARE_PROVIDER_SITE_OTHER): Payer: Medicare Other | Admitting: Adult Health

## 2019-11-12 ENCOUNTER — Other Ambulatory Visit: Payer: Self-pay

## 2019-11-12 DIAGNOSIS — F339 Major depressive disorder, recurrent, unspecified: Secondary | ICD-10-CM | POA: Diagnosis not present

## 2019-11-12 NOTE — Progress Notes (Signed)
Virtual Visit via Video Note  I connected with Allison Short 11/12/19 at 10:30 AM EDT by a video enabled telemedicine application and verified that I am speaking with the correct person using two identifiers.  Location patient: home Location provider:work or home office Persons participating in the virtual visit: patient, provider  I discussed the limitations of evaluation and management by telemedicine and the availability of in person appointments. The patient expressed understanding and agreed to proceed.   HPI: 69 year old female who  has a past medical history of History of blood transfusion, History of urinary tract infection, Hypertension, Non Hodgkin's lymphoma ([place]) (2008), Obesity, Osteopenia (04/2017), and Pancreatitis.  She is currently being treated for depression with Wellbutrin 150 mg twice daily.  She feels as though she is in a better place overall then when she started the medication a year ago,  and would like to start weaning herself off Wellbutrin.     ROS: See pertinent positives and negatives per HPI.  Past Medical History:  Diagnosis Date  . History of blood transfusion    2016  . History of urinary tract infection   . Hypertension   . Non Hodgkin's lymphoma ([place]) 2008  . Obesity   . Osteopenia 04/2017   T score -1.4 FRAX 7.5% / 0.8%  . Pancreatitis     Past Surgical History:  Procedure Laterality Date  . BIOPSY THYROID    . BONE MARROW BIOPSY    . CESAREAN SECTION  10/26/1975  . CHOLECYSTECTOMY     2006  . PORTA CATH INSERTION    . TONSILLECTOMY AND ADENOIDECTOMY     1952  . TOTAL KNEE ARTHROPLASTY Bilateral   . 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      Family History  Problem Relation Age of Onset  . Arthritis Maternal Grandmother   . Diabetes Son        Current Outpatient Medications:  .  acetaminophen (TYLENOL) 500 MG tablet, Take 500 mg by mouth every 6 (six) hours as needed for moderate pain. , Disp: , Rfl:  .  benazepril-hydrochlorthiazide (LOTENSIN  HCT) 20-25 MG tablet, TAKE 1 TABLET EVERY DAY, Disp: 90 tablet, Rfl: 0 .  buPROPion (WELLBUTRIN SR) 150 MG 12 hr tablet, TAKE 1 TABLET TWICE DAILY, Disp: 180 tablet, Rfl: 0 .  Cholecalciferol (VITAMIN D PO), Take 1,000 Units by mouth daily. , Disp: , Rfl:  .  metoprolol tartrate (LOPRESSOR) 50 MG tablet, TAKE 1 TABLET TWICE DAILY, Disp: 180 tablet, Rfl: 0  EXAM:  VITALS per patient if applicable:  GENERAL: alert, oriented, appears well and in no acute distress  HEENT: atraumatic, conjunttiva clear, no obvious abnormalities on inspection of external nose and ears  NECK: normal movements of the head and neck  LUNGS: on inspection no signs of respiratory distress, breathing rate appears normal, no obvious gross SOB, gasping or wheezing  CV: no obvious cyanosis  MS: moves all visible extremities without noticeable abnormality  PSYCH/NEURO: pleasant and cooperative, no obvious depression or anxiety, speech and thought processing grossly intact  ASSESSMENT AND PLAN:  Discussed the following assessment and plan:  1. Depression, recurrent ([place]) -I am okay with her weaning herself off Wellbutrin.  I discussed 2-week taper.  She was advised to follow-up with any withdrawal symptoms.  I will see her back in a month for her CPE and we will make sure that she is doing okay at that point in time    I discussed the assessment and treatment plan with the patient.  The patient was provided an opportunity to ask questions and all were answered. The patient agreed with the plan and demonstrated an understanding of the instructions.   The patient was advised to call back or seek an in-person evaluation if the symptoms worsen or if the condition fails to improve as anticipated.   Dorothyann Peng, NP

## 2019-12-17 ENCOUNTER — Encounter: Payer: Medicare Other | Admitting: Adult Health

## 2020-01-28 ENCOUNTER — Encounter: Payer: PRIVATE HEALTH INSURANCE | Admitting: Adult Health

## 2020-02-04 ENCOUNTER — Encounter: Payer: PRIVATE HEALTH INSURANCE | Admitting: Adult Health

## 2020-02-16 ENCOUNTER — Encounter: Payer: Self-pay | Admitting: Adult Health

## 2020-02-17 ENCOUNTER — Other Ambulatory Visit: Payer: Self-pay | Admitting: Adult Health

## 2020-02-17 DIAGNOSIS — I10 Essential (primary) hypertension: Secondary | ICD-10-CM

## 2020-05-11 ENCOUNTER — Encounter: Payer: Self-pay | Admitting: Adult Health

## 2020-05-12 ENCOUNTER — Encounter: Payer: PRIVATE HEALTH INSURANCE | Admitting: Adult Health

## 2020-05-18 ENCOUNTER — Other Ambulatory Visit: Payer: Self-pay | Admitting: Adult Health

## 2020-05-18 DIAGNOSIS — I10 Essential (primary) hypertension: Secondary | ICD-10-CM

## 2020-06-15 ENCOUNTER — Other Ambulatory Visit: Payer: Self-pay

## 2020-06-16 ENCOUNTER — Telehealth (INDEPENDENT_AMBULATORY_CARE_PROVIDER_SITE_OTHER): Payer: Medicare HMO | Admitting: Adult Health

## 2020-06-16 DIAGNOSIS — R460 Very low level of personal hygiene: Secondary | ICD-10-CM

## 2020-06-16 DIAGNOSIS — R2681 Unsteadiness on feet: Secondary | ICD-10-CM | POA: Diagnosis not present

## 2020-06-16 DIAGNOSIS — L089 Local infection of the skin and subcutaneous tissue, unspecified: Secondary | ICD-10-CM | POA: Diagnosis not present

## 2020-06-16 MED ORDER — FLUCONAZOLE 150 MG PO TABS: 150.0000 mg | ORAL_TABLET | ORAL | 0 refills | Status: DC

## 2020-06-16 MED ORDER — DOXYCYCLINE HYCLATE 100 MG PO CAPS: 100.0000 mg | ORAL_CAPSULE | Freq: Two times a day (BID) | ORAL | 0 refills | Status: DC

## 2020-06-16 NOTE — Progress Notes (Signed)
Virtual Visit via Video Note  I connected with Allison Short on 06/16/20 at  1:00 PM EDT by a video enabled telemedicine application and verified that I am speaking with the correct person using two identifiers.  Location patient: home Location provider:work or home office Persons participating in the virtual visit: patient, provider  I discussed the limitations of evaluation and management by telemedicine and the availability of in person appointments. The patient expressed understanding and agreed to proceed.   HPI: 70 year old female who  has a past medical history of History of blood transfusion, History of urinary tract infection, Hypertension, Non Hodgkin's lymphoma ([place]) (2008), Obesity, Osteopenia (04/2017), and Pancreatitis.  She is being evaluated today for concern about a skin infection on her right leg.  She reports that for the last 3 months she has had suspected infection around the ankle of her right leg.  She reports vesicles that are weeping and itching.  She denies pain.  She reportedly has not showered for the last few months as she is scared to do so, she reports 2 falls over the last couple months.  Her son has moved in with her so hopefully he will be able to help.  She is using a rolling walker to get around.  ROS: See pertinent positives and negatives per HPI.  Past Medical History:  Diagnosis Date  . History of blood transfusion    2016  . History of urinary tract infection   . Hypertension   . Non Hodgkin's lymphoma ([place]) 2008  . Obesity   . Osteopenia 04/2017   T score -1.4 FRAX 7.5% / 0.8%  . Pancreatitis     Past Surgical History:  Procedure Laterality Date  . BIOPSY THYROID    . BONE MARROW BIOPSY    . CESAREAN SECTION  10/26/1975  . CHOLECYSTECTOMY     2006  . PORTA CATH INSERTION    . TONSILLECTOMY AND ADENOIDECTOMY     1952  . TOTAL KNEE ARTHROPLASTY Bilateral   . 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      Family History  Problem Relation Age of Onset  . Arthritis  Maternal Grandmother   . Diabetes Son        Current Outpatient Medications:  .  acetaminophen (TYLENOL) 500 MG tablet, Take 500 mg by mouth every 6 (six) hours as needed for moderate pain. , Disp: , Rfl:  .  benazepril-hydrochlorthiazide (LOTENSIN HCT) 20-25 MG tablet, TAKE 1 TABLET EVERY DAY, Disp: 90 tablet, Rfl: 0 .  buPROPion (WELLBUTRIN SR) 150 MG 12 hr tablet, TAKE 1 TABLET TWICE DAILY, Disp: 180 tablet, Rfl: 0 .  Cholecalciferol (VITAMIN D PO), Take 1,000 Units by mouth daily. , Disp: , Rfl:  .  metoprolol tartrate (LOPRESSOR) 50 MG tablet, TAKE 1 TABLET TWICE DAILY, Disp: 180 tablet, Rfl: 0  EXAM:  VITALS per patient if applicable:  GENERAL: alert, oriented, appears well and in no acute distress  HEENT: atraumatic, conjunttiva clear, no obvious abnormalities on inspection of external nose and ears  NECK: normal movements of the head and neck  LUNGS: on inspection no signs of respiratory distress, breathing rate appears normal, no obvious gross SOB, gasping or wheezing  CV: no obvious cyanosis  MS: moves all visible extremities without noticeable abnormality  PSYCH/NEURO: pleasant and cooperative, no obvious depression or anxiety, speech and thought processing grossly intact  SKIN: Difficult to tell during video conference today. She did have vesicles that were draining around her sock line with areas of  redness.   ASSESSMENT AND PLAN:  Discussed the following assessment and plan:  1. Skin infection -On video it appears that she does simply have a fungal as well as bacterial skin infection.  We will treat with doxycycline and Diflucan.  She was advised that she will need to follow-up in the office if no improvement.  We will also refer to home health for assessment of her wound as well as to help with gait instability/frequent falls, and basic hygiene. - doxycycline (VIBRAMYCIN) 100 MG capsule; Take 1 capsule (100 mg total) by mouth 2 (two) times daily.  Dispense: 20  capsule; Refill: 0 - fluconazole (DIFLUCAN) 150 MG tablet; Take 1 tablet (150 mg total) by mouth once a week.  Dispense: 4 tablet; Refill: 0 - Ambulatory referral to Home Health  2. Gait instability  - Ambulatory referral to Home Health  3. Poor hygiene  - Ambulatory referral to Home Health     I discussed the assessment and treatment plan with the patient. The patient was provided an opportunity to ask questions and all were answered. The patient agreed with the plan and demonstrated an understanding of the instructions.   The patient was advised to call back or seek an in-person evaluation if the symptoms worsen or if the condition fails to improve as anticipated.   Dorothyann Peng, NP   Time spent on chart review, time with patient; discussion of skin infections, gait instability, hygiene home monitoring , follow up plan, and documentation 30 minutes

## 2020-06-26 ENCOUNTER — Telehealth: Payer: Self-pay | Admitting: Adult Health

## 2020-07-15 ENCOUNTER — Ambulatory Visit: Payer: Medicare HMO

## 2020-07-29 ENCOUNTER — Other Ambulatory Visit: Payer: Self-pay

## 2020-07-29 ENCOUNTER — Ambulatory Visit: Payer: Medicare HMO

## 2020-07-30 ENCOUNTER — Telehealth: Payer: Self-pay | Admitting: Adult Health

## 2020-07-30 NOTE — Telephone Encounter (Signed)
Leda Gauze, PT w/[facility] is calling to get verbal order for 2 week 2 1 week 1 starting Monday 2/44/0102 plan to re-certify on 09/15/3662.  May leave a detail msg on her secured voice mail.

## 2020-07-31 NOTE — Telephone Encounter (Signed)
Okay for verbal orders? Please advise 

## 2020-08-03 NOTE — Telephone Encounter (Signed)
Verbal orders given to [facility]. No further action needed!

## 2020-08-05 ENCOUNTER — Ambulatory Visit: Payer: Medicare HMO

## 2020-08-12 ENCOUNTER — Other Ambulatory Visit: Payer: Self-pay | Admitting: Adult Health

## 2020-08-12 ENCOUNTER — Telehealth: Payer: Self-pay

## 2020-08-12 ENCOUNTER — Ambulatory Visit: Payer: Medicare HMO

## 2020-08-12 DIAGNOSIS — I10 Essential (primary) hypertension: Secondary | ICD-10-CM

## 2020-08-12 NOTE — Telephone Encounter (Signed)
[facility] wanting sig. for wound care. Advised that Tommi Rumps is out of office and she stated that I can get another provider to sign off on PPW. Noted! Will see if someone will sign for him.

## 2020-08-14 ENCOUNTER — Ambulatory Visit: Payer: Medicare HMO

## 2020-08-14 NOTE — Telephone Encounter (Signed)
Dr.Koberlein sign form in place of [place] ok'd by [facility]osp[facility] nurse. Form has been faxed with confirmation.

## 2020-08-20 ENCOUNTER — Inpatient Hospital Stay (HOSPITAL_BASED_OUTPATIENT_CLINIC_OR_DEPARTMENT_OTHER)
Admission: EM | Admit: 2020-08-20 | Discharge: 2020-08-26 | DRG: 603 | Disposition: A | Discharge status: Home / Self Care | Payer: Medicare HMO | Attending: Internal Medicine | Admitting: Internal Medicine

## 2020-08-20 ENCOUNTER — Inpatient Hospital Stay (HOSPITAL_COMMUNITY): Payer: Medicare HMO

## 2020-08-20 ENCOUNTER — Emergency Department (HOSPITAL_BASED_OUTPATIENT_CLINIC_OR_DEPARTMENT_OTHER): Payer: Medicare HMO

## 2020-08-20 ENCOUNTER — Telehealth: Payer: Self-pay | Admitting: Adult Health

## 2020-08-20 ENCOUNTER — Other Ambulatory Visit: Payer: Self-pay

## 2020-08-20 ENCOUNTER — Encounter (HOSPITAL_BASED_OUTPATIENT_CLINIC_OR_DEPARTMENT_OTHER): Payer: Self-pay | Admitting: Emergency Medicine

## 2020-08-20 DIAGNOSIS — Z9049 Acquired absence of other specified parts of digestive tract: Secondary | ICD-10-CM | POA: Diagnosis not present

## 2020-08-20 DIAGNOSIS — Z885 Allergy status to narcotic agent status: Secondary | ICD-10-CM | POA: Diagnosis not present

## 2020-08-20 DIAGNOSIS — Z8571 Personal history of Hodgkin lymphoma: Secondary | ICD-10-CM

## 2020-08-20 DIAGNOSIS — Z79899 Other long term (current) drug therapy: Secondary | ICD-10-CM

## 2020-08-20 DIAGNOSIS — Z6841 Body Mass Index (BMI) 40.0 and over, adult: Secondary | ICD-10-CM

## 2020-08-20 DIAGNOSIS — Z8572 Personal history of non-Hodgkin lymphomas: Secondary | ICD-10-CM

## 2020-08-20 DIAGNOSIS — Z8261 Family history of arthritis: Secondary | ICD-10-CM | POA: Diagnosis not present

## 2020-08-20 DIAGNOSIS — M8580 Other specified disorders of bone density and structure, unspecified site: Secondary | ICD-10-CM | POA: Diagnosis present

## 2020-08-20 DIAGNOSIS — R5381 Other malaise: Secondary | ICD-10-CM | POA: Diagnosis present

## 2020-08-20 DIAGNOSIS — N179 Acute kidney failure, unspecified: Secondary | ICD-10-CM | POA: Diagnosis present

## 2020-08-20 DIAGNOSIS — Z880 Allergy status to penicillin: Secondary | ICD-10-CM | POA: Diagnosis not present

## 2020-08-20 DIAGNOSIS — I878 Other specified disorders of veins: Secondary | ICD-10-CM | POA: Diagnosis present

## 2020-08-20 DIAGNOSIS — Z20822 Contact with and (suspected) exposure to covid-19: Secondary | ICD-10-CM | POA: Diagnosis present

## 2020-08-20 DIAGNOSIS — Z833 Family history of diabetes mellitus: Secondary | ICD-10-CM | POA: Diagnosis not present

## 2020-08-20 DIAGNOSIS — Z881 Allergy status to other antibiotic agents status: Secondary | ICD-10-CM | POA: Diagnosis not present

## 2020-08-20 DIAGNOSIS — Z886 Allergy status to analgesic agent status: Secondary | ICD-10-CM | POA: Diagnosis not present

## 2020-08-20 DIAGNOSIS — R7989 Other specified abnormal findings of blood chemistry: Secondary | ICD-10-CM | POA: Diagnosis present

## 2020-08-20 DIAGNOSIS — Z87440 Personal history of urinary (tract) infections: Secondary | ICD-10-CM | POA: Diagnosis not present

## 2020-08-20 DIAGNOSIS — I10 Essential (primary) hypertension: Secondary | ICD-10-CM | POA: Diagnosis present

## 2020-08-20 DIAGNOSIS — E876 Hypokalemia: Secondary | ICD-10-CM | POA: Diagnosis present

## 2020-08-20 DIAGNOSIS — M79604 Pain in right leg: Secondary | ICD-10-CM | POA: Diagnosis present

## 2020-08-20 DIAGNOSIS — I890 Lymphedema, not elsewhere classified: Secondary | ICD-10-CM | POA: Diagnosis present

## 2020-08-20 DIAGNOSIS — D649 Anemia, unspecified: Secondary | ICD-10-CM | POA: Diagnosis present

## 2020-08-20 DIAGNOSIS — L0390 Cellulitis, unspecified: Secondary | ICD-10-CM | POA: Insufficient documentation

## 2020-08-20 DIAGNOSIS — L03115 Cellulitis of right lower limb: Principal | ICD-10-CM | POA: Diagnosis present

## 2020-08-20 DIAGNOSIS — Z96653 Presence of artificial knee joint, bilateral: Secondary | ICD-10-CM | POA: Diagnosis present

## 2020-08-20 LAB — COMPREHENSIVE METABOLIC PANEL
ALT: 16 U/L (ref 0–44)
AST: 9 U/L — ABNORMAL LOW (ref 15–41)
Albumin: 3.6 g/dL (ref 3.5–5.0)
Alkaline Phosphatase: 59 U/L (ref 38–126)
Anion gap: 11 (ref 5–15)
BUN: 31 mg/dL — ABNORMAL HIGH (ref 8–23)
CO2: 31 mmol/L (ref 22–32)
Calcium: 9.2 mg/dL (ref 8.9–10.3)
Chloride: 98 mmol/L (ref 98–111)
Creatinine, Ser: 1.17 mg/dL — ABNORMAL HIGH (ref 0.44–1.00)
GFR, Estimated: 50 mL/min — ABNORMAL LOW (ref >60)
Glucose, Bld: 120 mg/dL — ABNORMAL HIGH (ref 70–99)
Potassium: 3 mmol/L — ABNORMAL LOW (ref 3.5–5.1)
Sodium: 140 mmol/L (ref 135–145)
Total Bilirubin: 0.6 mg/dL (ref 0.3–1.2)
Total Protein: 7.1 g/dL (ref 6.5–8.1)

## 2020-08-20 LAB — PROTIME-INR
INR: 1 (ref 0.8–1.2)
Prothrombin Time: 13.5 seconds (ref 11.4–15.2)

## 2020-08-20 LAB — CBC WITH DIFFERENTIAL/PLATELET
Abs Immature Granulocytes: 0.04 10*3/uL (ref 0.00–0.07)
Basophils Absolute: 0 10*3/uL (ref 0.0–0.1)
Basophils Relative: 1 %
Eosinophils Absolute: 0.2 10*3/uL (ref 0.0–0.5)
Eosinophils Relative: 3 %
HCT: 37.3 % (ref 36.0–46.0)
Hemoglobin: 11.9 g/dL — ABNORMAL LOW (ref 12.0–15.0)
Immature Granulocytes: 1 %
Lymphocytes Relative: 20 %
Lymphs Abs: 1.8 10*3/uL (ref 0.7–4.0)
MCH: 32.4 pg (ref 26.0–34.0)
MCHC: 31.9 g/dL (ref 30.0–36.0)
MCV: 101.6 fL — ABNORMAL HIGH (ref 80.0–100.0)
Monocytes Absolute: 0.7 10*3/uL (ref 0.1–1.0)
Monocytes Relative: 9 %
Neutro Abs: 5.8 10*3/uL (ref 1.7–7.7)
Neutrophils Relative %: 66 %
Platelets: 236 10*3/uL (ref 150–400)
RBC: 3.67 MIL/uL — ABNORMAL LOW (ref 3.87–5.11)
RDW: 14.7 % (ref 11.5–15.5)
WBC: 8.6 10*3/uL (ref 4.0–10.5)
nRBC: 0 % (ref 0.0–0.2)

## 2020-08-20 LAB — URINALYSIS, ROUTINE W REFLEX MICROSCOPIC
Bilirubin Urine: NEGATIVE
Glucose, UA: NEGATIVE mg/dL
Hgb urine dipstick: NEGATIVE
Ketones, ur: NEGATIVE mg/dL
Leukocytes,Ua: NEGATIVE
Nitrite: NEGATIVE
Protein, ur: NEGATIVE mg/dL
Specific Gravity, Urine: 1.017 (ref 1.005–1.030)
pH: 6 (ref 5.0–8.0)

## 2020-08-20 LAB — RESP PANEL BY RT-PCR (FLU A&B, COVID) ARPGX2
Influenza A by PCR: NEGATIVE
Influenza B by PCR: NEGATIVE
SARS Coronavirus 2 by RT PCR: NEGATIVE

## 2020-08-20 LAB — APTT: aPTT: 34 seconds (ref 24–36)

## 2020-08-20 LAB — LACTIC ACID, PLASMA: Lactic Acid, Venous: 1 mmol/L (ref 0.5–1.9)

## 2020-08-20 LAB — MAGNESIUM: Magnesium: 1.7 mg/dL (ref 1.7–2.4)

## 2020-08-20 LAB — D-DIMER, QUANTITATIVE: D-Dimer, Quant: 1.5 ug/mL-FEU — ABNORMAL HIGH (ref 0.00–0.50)

## 2020-08-20 MED ORDER — HYDROMORPHONE HCL 1 MG/ML IJ SOLN
1.0000 mg | Freq: Once | INTRAMUSCULAR | Status: AC
  Administered 2020-08-20: 1 mg via INTRAVENOUS
  Filled 2020-08-20: qty 1

## 2020-08-20 MED ORDER — ACETAMINOPHEN 325 MG PO TABS
650.0000 mg | ORAL_TABLET | Freq: Four times a day (QID) | ORAL | Status: DC | PRN
  Administered 2020-08-20 – 2020-08-21 (×2): 650 mg via ORAL
  Filled 2020-08-20 (×2): qty 2

## 2020-08-20 MED ORDER — HYDROMORPHONE HCL 1 MG/ML IJ SOLN
1.0000 mg | INTRAMUSCULAR | Status: AC | PRN
Start: 2020-08-20 — End: 2020-08-21
  Administered 2020-08-20 – 2020-08-21 (×4): 1 mg via INTRAVENOUS
  Filled 2020-08-20 (×4): qty 1

## 2020-08-20 MED ORDER — IOHEXOL 350 MG/ML SOLN
100.0000 mL | Freq: Once | INTRAVENOUS | Status: AC | PRN
  Administered 2020-08-20: 100 mL via INTRAVENOUS

## 2020-08-20 MED ORDER — ACETAMINOPHEN 650 MG RE SUPP: 650.0000 mg | Freq: Four times a day (QID) | RECTAL | Status: DC | PRN

## 2020-08-20 MED ORDER — ENOXAPARIN SODIUM 100 MG/ML IJ SOSY
0.5000 mg/kg | PREFILLED_SYRINGE | INTRAMUSCULAR | Status: DC
  Filled 2020-08-20: qty 0.85

## 2020-08-20 MED ORDER — METOPROLOL TARTRATE 50 MG PO TABS
50.0000 mg | ORAL_TABLET | Freq: Two times a day (BID) | ORAL | Status: DC
  Administered 2020-08-21 – 2020-08-26 (×11): 50 mg via ORAL
  Filled 2020-08-20 (×11): qty 1

## 2020-08-20 MED ORDER — ENOXAPARIN SODIUM 300 MG/3ML IJ SOLN
1.0000 mg/kg | Freq: Two times a day (BID) | INTRAMUSCULAR | Status: DC
  Administered 2020-08-20: 170 mg via SUBCUTANEOUS
  Filled 2020-08-20: qty 1.7

## 2020-08-20 MED ORDER — POTASSIUM CHLORIDE 10 MEQ/100ML IV SOLN
10.0000 meq | INTRAVENOUS | Status: DC
  Administered 2020-08-20: 10 meq via INTRAVENOUS
  Filled 2020-08-20: qty 100

## 2020-08-20 MED ORDER — SODIUM CHLORIDE 0.9 % IV BOLUS
500.0000 mL | Freq: Once | INTRAVENOUS | Status: AC
  Administered 2020-08-20: 500 mL via INTRAVENOUS

## 2020-08-20 MED ORDER — BENAZEPRIL HCL 20 MG PO TABS
20.0000 mg | ORAL_TABLET | Freq: Every day | ORAL | Status: DC
  Administered 2020-08-21 – 2020-08-25 (×5): 20 mg via ORAL
  Filled 2020-08-20 (×5): qty 1

## 2020-08-20 MED ORDER — SODIUM CHLORIDE (PF) 0.9 % IJ SOLN
INTRAMUSCULAR | Status: AC
  Filled 2020-08-20: qty 50

## 2020-08-20 MED ORDER — HYDROCHLOROTHIAZIDE 25 MG PO TABS
25.0000 mg | ORAL_TABLET | Freq: Every day | ORAL | Status: DC
  Administered 2020-08-21 – 2020-08-25 (×5): 25 mg via ORAL
  Filled 2020-08-20 (×5): qty 1

## 2020-08-20 MED ORDER — VITAMIN D 25 MCG (1000 UNIT) PO TABS
1000.0000 [IU] | ORAL_TABLET | Freq: Every day | ORAL | Status: DC
  Administered 2020-08-21 – 2020-08-26 (×6): 1000 [IU] via ORAL
  Filled 2020-08-20 (×6): qty 1

## 2020-08-20 MED ORDER — BENAZEPRIL-HYDROCHLOROTHIAZIDE 20-25 MG PO TABS: 1.0000 | ORAL_TABLET | Freq: Every day | ORAL | Status: DC

## 2020-08-20 MED ORDER — POTASSIUM CHLORIDE CRYS ER 20 MEQ PO TBCR
40.0000 meq | EXTENDED_RELEASE_TABLET | Freq: Once | ORAL | Status: AC
  Administered 2020-08-20: 40 meq via ORAL
  Filled 2020-08-20: qty 2

## 2020-08-20 MED ORDER — VANCOMYCIN HCL IN DEXTROSE 1-5 GM/200ML-% IV SOLN
1000.0000 mg | Freq: Once | INTRAVENOUS | Status: AC
  Administered 2020-08-20: 1000 mg via INTRAVENOUS
  Filled 2020-08-20: qty 200

## 2020-08-20 MED ORDER — ONDANSETRON HCL 4 MG/2ML IJ SOLN
4.0000 mg | Freq: Once | INTRAMUSCULAR | Status: AC
  Administered 2020-08-20: 4 mg via INTRAVENOUS
  Filled 2020-08-20: qty 2

## 2020-08-20 MED ORDER — LACTATED RINGERS IV SOLN: INTRAVENOUS | Status: DC

## 2020-08-20 NOTE — H&P (Signed)
History and Physical    Linden Mikes TJQ:[id number] DOB: 1950-06-30 DOA: 08/20/2020  PCP: Dorothyann Peng, NP   Patient coming from: Home via [facility] in transfer  Chief Complaint: Pain and redness of right leg  HPI: Allison Short is a 70 y.o. female with medical history significant for HTN, lymphedema of legs, Nonhodgkins Lymphoma who has home health nurse a few times a week to change dressings and has home PT. this morning home health nurse came to recheck her leg and found it to be more erythematous swollen with some weeping drainage of the right leg.  She is very limited from a mobility standpoint and cannot bear weight on her right leg.  She lives alone.  [facility] evaluation found to have cellulitis of her right leg.  She has had trouble with her legs since having bilateral knee replacements a few years ago.  When she had the operation she had complications of the right leg that required a wound VAC and nursing home stay for rehab.  She does have a history of non-Hodgkin's lymphoma and had a Port-A-Cath 10 years ago.  At the end of her chemotherapy treatment she did get a blood clot in her Port-A-Cath and after therapy the Port-A-Cath was removed.  She has no other history of thromboembolism.  She has not had fever, chills, headache, chest pain, palpitations, cough, abdominal pain, urinary symptoms.  She does report mild shortness of breath with exertion and movement which is not uncommon for her but it seems to be more pronounced she states over the last day or 2. Denies tobacco, alcohol, illicit drug use.  ED Course: Allison Short was hemodynamically stable in the emergency room.  She had ultrasound of her legs which were limited due to her body habitus but no DVT was identified.  She was started on vancomycin for cellulitis of her right leg.  Labs showed a sodium of 140, potassium 3.0, chloride 98, bicarb 31, glucose 120, creatinine 1.17 from baseline of  1.01, BUN 31, normal LFTs.  Lactic acid 1.0.  WBC 8600, hemoglobin 11.9, hematocrit 37.3, platelets 236,000.  D-dimer was elevated at 1.50.  Hospitalist service was asked to admit for further management  Review of Systems:  General: Denies fever, chills, weight loss, night sweats.  Denies dizziness.  Denies change in appetite HENT: Denies head trauma, headache, denies change in hearing, tinnitus.  Denies nasal congestion or bleeding.  Denies sore throat, sores in mouth.  Denies difficulty swallowing Eyes: Denies blurry vision, pain in eye, drainage.  Denies discoloration of eyes. Neck: Denies pain.  Denies swelling.  Denies pain with movement. Cardiovascular: Denies chest pain, palpitations.  Denies edema.  Denies orthopnea Respiratory: Reports shortness of breath.  Denies wheezing.  Denies sputum production Gastrointestinal: Denies abdominal pain, swelling.  Denies nausea, vomiting, diarrhea.  Denies melena.  Denies hematemesis. Musculoskeletal: Denies limitation of movement.  Denies deformity or swelling.  Denies pain.  Denies arthralgias or myalgias. Genitourinary: Denies pelvic pain.  Denies urinary frequency or hesitancy.  Denies dysuria.  Skin: Redness and pain of right lower leg. Denies petechiae, purpura, ecchymosis. Neurological: Denies syncope. Denies seizure activity. Denies paresthesia. Denies slurred speech, drooping face.  Denies visual change. Psychiatric: Denies depression, anxiety. Denies hallucinations.  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 She has never used smokeless tobacco. She reports current alcohol use. She reports that she does not use drugs.  Allergies  Allergen Reactions   Ciprofloxin Hcl [Ciprofloxacin] Other (See Comments)    Unstable gait   Morphine And Related Anaphylaxis   Asa [Aspirin] Hives   Codeine Hives   Nsaids     Lowers GFR   Penicillins Other (See Comments)    Patient does not know      Family History  Problem Relation Age of Onset   Arthritis Maternal Grandmother    Diabetes Son      Prior to Admission medications   Medication Sig Start Date End Date Taking? Authorizing Provider  acetaminophen (TYLENOL) 500 MG tablet Take 500 mg by mouth every 6 (six) hours as needed for moderate pain.    Yes [provider]  benazepril-hydrochlorthiazide (LOTENSIN HCT) 20-25 MG tablet TAKE 1 TABLET EVERY DAY 08/12/20  Yes Nafziger, Tommi Rumps, NP  Cholecalciferol (VITAMIN D PO) Take 1,000 Units by mouth daily.    Yes [provider]  metoprolol tartrate (LOPRESSOR) 50 MG tablet TAKE 1 TABLET TWICE DAILY 08/12/20  Yes Nafziger, Tommi Rumps, NP  buPROPion [facility] SR) 150 MG 12 hr tablet TAKE 1 TABLET TWICE DAILY 08/13/19   Nafziger, Tommi Rumps, NP  doxycycline (VIBRAMYCIN) 100 MG capsule Take 1 capsule (100 mg total) by mouth 2 (two) times daily. 06/16/20   Nafziger, Tommi Rumps, NP  fluconazole (DIFLUCAN) 150 MG tablet Take 1 tablet (150 mg total) by mouth once a week. 06/16/20   Dorothyann Peng, NP    Physical Exam: Vitals:   08/20/20 1800 08/20/20 1900 08/20/20 2000 08/20/20 2141  BP: 123/72 129/65 129/60 (!) 110/59  Pulse: 71 77 78 79  Resp: (!) '24 14 19 18  ' Temp:    98 F (36.7 C)  TempSrc:      SpO2: 97% 99% 100% 98%  Weight:    (!) 169.3 kg  Height:    '5\' 6"'  (1.676 m)    Constitutional: NAD, calm, comfortable Vitals:   08/20/20 1800 08/20/20 1900 08/20/20 2000 08/20/20 2141  BP:  123/72 129/65 129/60 (!) 110/59  Pulse: 71 77 78 79  Resp: (!) '24 14 19 18  ' Temp:    98 F (36.7 C)  TempSrc:      SpO2: 97% 99% 100% 98%  Weight:    (!) 169.3 kg  Height:    '5\' 6"'  (1.676 m)   General: WDWN, Alert and oriented x3.  Eyes: EOMI, PERRL, conjunctivae normal. Sclera nonicteric HENT:  [place]/AT, external ears normal.  Nares patent without epistasis.  Mucous membranes are moist.  Neck: Soft, normal range of motion, supple, no masses, Trachea midline Respiratory: clear to auscultation bilaterally, no wheezing, no crackles. Normal respiratory effort. No accessory muscle use.  Cardiovascular: Regular rate and rhythm, no murmurs / rubs / gallops. Has bilateral lower extremity edema.   Abdomen: Soft, no tenderness, nondistended,  no rebound or guarding.  No masses palpated. Morbid Obesity. Bowel sounds normoactive Musculoskeletal: LROM lower extremities. Healed surgical scars over anterior knees bilaterally. no cyanosis.  Bilateral leg lymphedema.  Small bullae and superficial wound on the dorsum of the foot on the left.  Right leg swollen and erythematous in the proximal and medial lower leg and distal part of medial thigh.  Large bullae on dorsum of right foot that is unruptured.  Erythematous regions are indurated.  No fluctuation or drainage. Skin: Warm, dry, intact no rashes, lesions, ulcers. No induration Neurologic: CN 2-12 grossly intact.  Normal speech.  Sensation intact Psychiatric: Normal judgment and insight.  Normal mood.    Labs on Admission: I have personally reviewed following labs and imaging studies  CBC: Recent Labs  Lab 08/20/20 1716  WBC 8.6  NEUTROABS 5.8  HGB 11.9*  HCT 37.3  MCV 101.6*  PLT 403    Basic Metabolic Panel: Recent Labs  Lab 08/20/20 1716  NA 140  K 3.0*  CL 98  CO2 31  GLUCOSE 120*  BUN 31*  CREATININE 1.17*  CALCIUM 9.2    GFR: Estimated Creatinine Clearance: 70.9 mL/min (A) (by C-G formula based on SCr of 1.17 mg/dL  (H)).  Liver Function Tests: Recent Labs  Lab 08/20/20 1716  AST 9*  ALT 16  ALKPHOS 59  BILITOT 0.6  PROT 7.1  ALBUMIN 3.6    Urine analysis:    Component Value Date/Time   COLORURINE YELLOW 08/20/2020 [place] 08/20/2020 1958   LABSPEC 1.017 08/20/2020 1958   PHURINE 6.0 08/20/2020 1958   GLUCOSEU NEGATIVE 08/20/2020 1958   HGBUR NEGATIVE 08/20/2020 1958   BILIRUBINUR NEGATIVE 08/20/2020 1958   KETONESUR NEGATIVE 08/20/2020 1958   PROTEINUR NEGATIVE 08/20/2020 1958   NITRITE NEGATIVE 08/20/2020 1958   LEUKOCYTESUR NEGATIVE 08/20/2020 1958    Radiological Exams on Admission: US Venous Img Lower Unilateral Right  Result Date: 08/20/2020 CLINICAL DATA:  70 year old with right foot redness and edema. EXAM: RIGHT LOWER EXTREMITY VENOUS DOPPLER ULTRASOUND TECHNIQUE: Gray-scale sonography with graded compression, as well as color Doppler and duplex ultrasound were performed to evaluate the lower extremity deep venous systems from the level of the common femoral vein and including the common femoral, femoral, profunda femoral, popliteal and calf veins including the posterior tibial, peroneal and gastrocnemius veins when visible. The superficial great saphenous vein was also interrogated. Spectral Doppler was utilized to evaluate flow at rest and with distal augmentation maneuvers in the common femoral, femoral and popliteal veins. COMPARISON:  None. FINDINGS: Contralateral Common Femoral Vein: No evidence of thrombus. Normal compressibility and color Doppler flow. Common Femoral Vein: No evidence of thrombus. Normal compressibility, respiratory phasicity and response to augmentation. Saphenofemoral Junction: No evidence of thrombus. Normal compressibility and flow on color Doppler imaging. Profunda Femoral Vein: No evidence of thrombus. Normal compressibility and flow on color Doppler imaging. Femoral Vein: No evidence of thrombus. Normal compressibility, respiratory  phasicity and response to augmentation. Popliteal Vein: No evidence of thrombus. Normal compressibility, respiratory phasicity and response to augmentation. Calf Veins: Visualized right deep calf veins are patent without thrombus. Limited evaluation. Superficial Great Saphenous Vein: No evidence of thrombus. Normal compressibility. Other Findings: Prominent right inguinal lymph node measuring 5.1 x 2.3 x 3.4 cm. Lymph node is slightly lobulated. IMPRESSION: 1. Negative for deep venous thrombosis in right lower extremity. Study has technical limitations due to the edema and body habitus. 2. Prominent right inguinal lymph node is likely reactive based  on history and right lower extremity edema. Electronically Signed   By: Markus Daft M.D.   On: 08/20/2020 17:16   DG Chest Port 1 View  Result Date: 08/20/2020 CLINICAL DATA:  70 year old female with concern for sepsis. EXAM: PORTABLE CHEST 1 VIEW COMPARISON:  Chest radiograph dated 07/09/2015. FINDINGS: No focal consolidation, pleural effusion, or pneumothorax. The cardiac silhouette is within limits. No acute osseous pathology. IMPRESSION: No active disease. Electronically Signed   By: Anner Crete M.D.   On: 08/20/2020 16:54    EKG: Independently reviewed.  EKG shows normal sinus rhythm with PVC.  QTC borderline prolonged at 484  Assessment/Plan Principal Problem:   Cellulitis of right lower extremity Allison Short is admitted to Med/Surg floor. Started on vancomycin for empiric antibiotic coverage. Dilaudid for pain control overnight with pain in right leg from cellulitis.  Active Problems:   Essential hypertension Continue home medications of Lotensin HCT and metoprolol.  Monitor blood pressure    Hypokalemia Patient given 10 mEq of potassium IV in the emergency room.  Will be given 40 mill equivalents p.o. potassium now.  Check magnesium level and if low will replete.    AKI (acute kidney injury)  Mild elevation of creatinine level from  baseline.  IV fluid hydration with LR at 100 ml/hr. Recheck electrolytes and renal function morning    D-dimer elevated Place on Lovenox 1 mg/kg bid until PE can be ruled out     Morbid obesity Chronic.  Follow-up with PCP for dietary, lifestyle, pharmacotherapy and/or bariatric surgery therapies for long-term weight loss    DVT prophylaxis: Pt is placed on Lovenox 1 mg/kg dose bid for DVT prophylaxis and elevated D-dimer. Discussed with pharmacy.  Code Status:   Full Code  Family Communication:  Diagnosis and plan discussed with patient.  She verbalized understanding agrees with plan.  Further recommendations to follow as clinically indicated Disposition Plan:   Patient is from:  Home  Anticipated DC to:  Home  Anticipated DC date:  Anticipate 2 midnight or more stay  Anticipated DC barriers: No barriers to discharge identified at this time  Admission status:  Inpatient   Yevonne Aline Joesph Marcy MD [facility]  How to contact the [facility] Attending or Consulting provider [place] or covering provider during after hours [place], for this patient?   Check the care team in [facility] and look for a) attending/consulting [facility] provider listed and b) the [facility] team listed Log into [email or web address] and use [place]'s universal password to access. If you do not have the password, please contact the hospital operator. Locate the [facility] provider you are looking for under [facility] and page to a number that you can be directly reached. If you still have difficulty reaching the provider, please page the [facility] (Director on Call) for the Hospitalists listed on amion for assistance.  08/20/2020, 10:43 PM

## 2020-08-20 NOTE — Telephone Encounter (Signed)
Please advise 

## 2020-08-20 NOTE — ED Notes (Signed)
Patient O2 saturation drops to 86%, After readjusting monitor on patient and instructing patient to breath in through her nose out through her mouth, O2 saturation did not increase. This nurse put patient on 2L of O2 and saturation increased to 98%. Respiratory Therapist/provider notified.

## 2020-08-20 NOTE — ED Triage Notes (Signed)
Pt arrived via [facility]. Pt complains of pain in her right foot for her right related to a blister, redness,  swelling and edema, for the past few days. Pt sent by home health care RN do to same as well as a weeping wound.

## 2020-08-20 NOTE — Telephone Encounter (Signed)
Alyse Low from [facility] called to let Dorothyann Peng know that the patient is requesting a muscle relaxer because she pulled a muscle in her right upper leg during physical therapy and pain is going up her back.  She wants it sent to [facility] and she will contact them to expedite it.  [facility] ([facility] - [place], [place] Phone:  [phone]  Fax:  [phone]     If you have any questions you can contact:  [facility]  [phone]

## 2020-08-20 NOTE — ED Provider Notes (Signed)
[facility] EMERGENCY DEPT Provider Note   CSN: [id number] Arrival date & time: 08/20/20  1528     History Chief Complaint  Patient presents with   Leg Pain    Allison Short is a 70 y.o. female.  Angela Vazguez has a history of lymphedema.  She receives home health care for wound changes.  She was last seen this morning, and compared to when her home health nurse visited 2 days prior, she had marked increase in redness, swelling, and drainage from her right leg.  She is exceedingly limited from a mobility standpoint, and she cannot bear weight on her right leg.  She is status post multiple total knee arthroplasty, and the right leg was complicated by a postoperative infection for which she required a wound VAC.  History of Hodgkin's lymphoma and had a Port-A-Cath associated blood clot.  No other history of thromboembolism.  The history is provided by the patient.  Rash Location:  Leg Leg rash location:  R upper leg, R lower leg and R foot Quality: blistering, draining, painful, redness, swelling and weeping   Pain details:    Quality:  Throbbing   Severity:  Severe   Onset quality:  Gradual   Duration:  2 days   Timing:  Constant   Progression:  Worsening Severity:  Severe Onset quality:  Gradual Duration:  2 days Timing:  Constant Progression:  Worsening Chronicity:  New Context comment:  History of lymphedema Relieved by:  Nothing Worsened by:  Nothing Ineffective treatments: wound care. Associated symptoms: nausea and shortness of breath   Associated symptoms: no abdominal pain, no fever, no joint pain, no sore throat and not vomiting       Past Medical History:  Diagnosis Date   History of blood transfusion    2016   History of urinary tract infection    Hypertension    Non Hodgkin's lymphoma ([place]) 2008   Obesity    Osteopenia 04/2017   T score -1.4 FRAX 7.5% / 0.8%   Pancreatitis     Patient Active Problem List   Diagnosis Date Noted    Hypertension    Essential hypertension 07/08/2015   Non Hodgkin's lymphoma ([place]) 02/21/2006    Past Surgical History:  Procedure Laterality Date   BIOPSY THYROID     BONE MARROW BIOPSY     CESAREAN SECTION  10/26/1975   CHOLECYSTECTOMY     2006   PORTA CATH INSERTION     TONSILLECTOMY AND ADENOIDECTOMY     1952   TOTAL KNEE ARTHROPLASTY Bilateral    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 Authorizing Provider  acetaminophen (TYLENOL) 500 MG tablet Take 500 mg by mouth every 6 (six) hours as needed for moderate pain.    Yes [provider]  benazepril-hydrochlorthiazide (LOTENSIN HCT) 20-25 MG tablet TAKE 1 TABLET EVERY DAY 08/12/20  Yes Nafziger, Tommi Rumps, NP  Cholecalciferol (VITAMIN D PO) Take 1,000 Units by mouth daily.    Yes [provider]  metoprolol tartrate (LOPRESSOR) 50 MG tablet TAKE 1 TABLET TWICE DAILY 08/12/20  Yes Nafziger, Tommi Rumps, NP  buPROPion [facility] SR) 150 MG 12 hr tablet TAKE 1 TABLET TWICE DAILY 08/13/19   Nafziger, Tommi Rumps, NP  doxycycline (VIBRAMYCIN) 100 MG capsule Take 1 capsule (100 mg total) by mouth 2 (two) times daily. 06/16/20   Nafziger, Tommi Rumps, NP  fluconazole (DIFLUCAN) 150 MG tablet Take 1 tablet (150 mg total) by mouth once a week. 06/16/20   Nafziger, Tommi Rumps, NP    Allergies    Ciprofloxin hcl [ciprofloxacin], Morphine and related, Asa [aspirin],  Codeine, Nsaids, and Penicillins  Review of Systems   Review of Systems  Constitutional:  Negative for chills and fever.  HENT:  Negative for ear pain and sore throat.   Eyes:  Negative for pain and visual disturbance.  Respiratory:  Positive for shortness of breath. Negative for cough.   Cardiovascular:  Negative for chest pain and palpitations.  Gastrointestinal:  Positive for nausea. Negative for abdominal pain and vomiting.  Genitourinary:  Negative for dysuria and hematuria.  Musculoskeletal:  Negative for arthralgias and back pain.  Skin:  Positive for rash. Negative for color change.  Neurological:  Negative for seizures and syncope.  Psychiatric/Behavioral:  Negative for agitation.   All other systems reviewed and are negative.  Physical Exam Updated Vital Signs BP 118/84 (BP Location: Right Wrist)   Pulse 90   Temp 97.8 F (36.6 C) (Oral)   Resp (!) 22   Ht '5\' 7"'  (1.702 m)   Wt (!) 158.8 kg   SpO2 100%   BMI 54.82 kg/m   Physical Exam Vitals and nursing note reviewed.  Constitutional:      Appearance: She is well-developed.  HENT:     Head: Normocephalic and atraumatic.  Cardiovascular:     Rate and Rhythm: Normal rate and regular rhythm.     Pulses: Normal pulses.     Heart sounds: Normal heart sounds.  Pulmonary:     Effort: Pulmonary effort is normal. No tachypnea.     Breath sounds: Normal breath sounds.  Musculoskeletal:        General: No deformity.  Skin:    Capillary Refill: Capillary refill takes less than 2 seconds.     Comments: Both limbs are swollen and lymphedematous.  On the left anterior and distal lower leg, there are small bullae and a wound at the dorsum of the foot.  The right leg is diffusely swollen and erythematous from the proximal and medial thigh all the way down to the foot.  Almost the entirety of the lower leg is swollen and erythematous with worsened skin breakdown at the anterior aspect.  There is a large blister on the dorsum of  the right foot and multiple other open areas throughout the lower leg.  Neurological:     General: No focal deficit present.     Mental Status: She is alert and oriented to person, place, and time.  Psychiatric:  Mood and Affect: Mood normal.        Behavior: Behavior normal.    ED Results / Procedures / Treatments   Labs (all labs ordered are listed, but only abnormal results are displayed) Labs Reviewed  COMPREHENSIVE METABOLIC PANEL - Abnormal; Notable for the following components:      Result Value   Potassium 3.0 (*)    Glucose, Bld 120 (*)    BUN 31 (*)    Creatinine, Ser 1.17 (*)    AST 9 (*)    GFR, Estimated 50 (*)    All other components within normal limits  CBC WITH DIFFERENTIAL/PLATELET - Abnormal; Notable for the following components:   RBC 3.67 (*)    Hemoglobin 11.9 (*)    MCV 101.6 (*)    All other components within normal limits  D-DIMER, QUANTITATIVE - Abnormal; Notable for the following components:   D-Dimer, Quant 1.50 (*)    All other components within normal limits  RESP PANEL BY RT-PCR (FLU A&B, COVID) ARPGX2  CULTURE, BLOOD (SINGLE)  URINE CULTURE  LACTIC ACID, PLASMA  PROTIME-INR  APTT  URINALYSIS, ROUTINE W REFLEX MICROSCOPIC    EKG None  Radiology No results found.  Procedures Procedures   Medications Ordered in ED Medications  vancomycin (VANCOCIN) IVPB 1000 mg/200 mL premix (has no administration in time range)    ED Course  I have reviewed the triage vital signs and the nursing notes.  Pertinent labs & imaging results that were available during my care of the patient were reviewed by me and considered in my medical decision making (see chart for details).  Clinical Course as of 08/20/20 2006  Thu Aug 20, 2020  2005 I spoke with Dr. Nevada Crane who will accept the patient. [AW]    Clinical Course User Index [AW] Arnaldo Natal, MD   MDM Rules/Calculators/A&P                          [name] presents upon referral from  her home health nurse for right leg swelling and redness.  There was a concern for cellulitis.  Her clinical exam was consistent with cellulitis with lymphangitic streaking into the groin.  Because of the patient's relative immobility, I also wanted to make sure she did not have evidence of DVT.  Ultrasound was limited secondary to the patient's body habitus, but it was grossly negative for DVT.  The patient was given vancomycin and will be admitted to the hospital for further treatment. Final Clinical Impression(s) / ED Diagnoses Final diagnoses:  Cellulitis of right lower extremity  Morbid obesity ([place])  Hypokalemia  Anemia, unspecified type    Rx / DC Orders ED Discharge Orders     None        Arnaldo Natal, MD 08/20/20 2008

## 2020-08-20 NOTE — Plan of Care (Signed)
Page placed to Admitting for Attending MD to address New Admit and Active Telemetry, Air/Contact orders. COVID negative. One of Two runs of potassium administered.

## 2020-08-20 NOTE — ED Notes (Signed)
Called [facility] to transport patient to [facility] [phone]

## 2020-08-21 LAB — BASIC METABOLIC PANEL
Anion gap: 10 (ref 5–15)
BUN: 28 mg/dL — ABNORMAL HIGH (ref 8–23)
CO2: 30 mmol/L (ref 22–32)
Calcium: 8.7 mg/dL — ABNORMAL LOW (ref 8.9–10.3)
Chloride: 100 mmol/L (ref 98–111)
Creatinine, Ser: 1.06 mg/dL — ABNORMAL HIGH (ref 0.44–1.00)
GFR, Estimated: 56 mL/min — ABNORMAL LOW (ref >60)
Glucose, Bld: 151 mg/dL — ABNORMAL HIGH (ref 70–99)
Potassium: 3.4 mmol/L — ABNORMAL LOW (ref 3.5–5.1)
Sodium: 140 mmol/L (ref 135–145)

## 2020-08-21 LAB — CBC
HCT: 38.3 % (ref 36.0–46.0)
Hemoglobin: 11.6 g/dL — ABNORMAL LOW (ref 12.0–15.0)
MCH: 32 pg (ref 26.0–34.0)
MCHC: 30.3 g/dL (ref 30.0–36.0)
MCV: 105.5 fL — ABNORMAL HIGH (ref 80.0–100.0)
Platelets: 210 10*3/uL (ref 150–400)
RBC: 3.63 MIL/uL — ABNORMAL LOW (ref 3.87–5.11)
RDW: 14.8 % (ref 11.5–15.5)
WBC: 8 10*3/uL (ref 4.0–10.5)
nRBC: 0 % (ref 0.0–0.2)

## 2020-08-21 MED ORDER — DIPHENHYDRAMINE HCL 50 MG/ML IJ SOLN
25.0000 mg | Freq: Four times a day (QID) | INTRAMUSCULAR | Status: DC | PRN
  Administered 2020-08-21 – 2020-08-25 (×9): 25 mg via INTRAVENOUS
  Filled 2020-08-21 (×9): qty 1

## 2020-08-21 MED ORDER — VANCOMYCIN HCL 10 G IV SOLR
2500.0000 mg | INTRAVENOUS | Status: DC
  Administered 2020-08-21: 2500 mg via INTRAVENOUS
  Filled 2020-08-21: qty 2500

## 2020-08-21 MED ORDER — HYDROCODONE-ACETAMINOPHEN 10-325 MG PO TABS
1.0000 | ORAL_TABLET | Freq: Four times a day (QID) | ORAL | Status: DC | PRN
  Administered 2020-08-22 – 2020-08-26 (×14): 1 via ORAL
  Filled 2020-08-21 (×15): qty 1

## 2020-08-21 MED ORDER — ENOXAPARIN SODIUM 80 MG/0.8ML IJ SOSY
80.0000 mg | PREFILLED_SYRINGE | INTRAMUSCULAR | Status: DC
  Administered 2020-08-21 – 2020-08-25 (×5): 80 mg via SUBCUTANEOUS
  Filled 2020-08-21 (×5): qty 0.8

## 2020-08-21 MED ORDER — POTASSIUM CHLORIDE CRYS ER 20 MEQ PO TBCR: 40.0000 meq | EXTENDED_RELEASE_TABLET | Freq: Once | ORAL | Status: DC

## 2020-08-21 MED ORDER — OXYCODONE HCL 5 MG PO TABS: 10.0000 mg | ORAL_TABLET | Freq: Four times a day (QID) | ORAL | Status: DC | PRN

## 2020-08-21 MED ORDER — POTASSIUM CHLORIDE CRYS ER 20 MEQ PO TBCR
40.0000 meq | EXTENDED_RELEASE_TABLET | Freq: Once | ORAL | Status: AC
  Administered 2020-08-21: 40 meq via ORAL
  Filled 2020-08-21: qty 2

## 2020-08-21 NOTE — Progress Notes (Signed)
Pharmacy Antibiotic Note  Allison Short is a 70 y.o. female admitted on 08/20/2020 with cellulitis of right lower extremity.  Pharmacy has been consulted for vancomycin dosing.  Plan: Vancomycin 2500mg  IV q36h (AUC 516.7, Scr 1.17) Follow renal function and clinical course  Height: 5\' 6"  (167.6 cm) Weight: (!) 169.3 kg (373 lb 3.8 oz) IBW/kg (Calculated) : 59.3  Temp (24hrs), Avg:97.9 F (36.6 C), Min:97.8 F (36.6 C), Max:98 F (36.7 C)  Recent Labs  Lab 08/20/20 1716 08/20/20 1718  WBC 8.6  --   CREATININE 1.17*  --   LATICACIDVEN  --  1.0    Estimated Creatinine Clearance: 70.9 mL/min (A) (by C-G formula based on SCr of 1.17 mg/dL (H)).    Allergies  Allergen Reactions   Ciprofloxin Hcl [Ciprofloxacin] Other (See Comments)    Unstable gait   Morphine And Related Anaphylaxis   Asa [Aspirin] Hives   Codeine Hives   Nsaids     Lowers GFR   Penicillins Other (See Comments)    Patient does not know      Antimicrobials this admission: 6/30 vanc >>  Dose adjustments this admission:   Microbiology results: 6/30 BCx:  6/30 UCx:    Thank you for allowing pharmacy to be a part of this patient's care.  Dolly Rias RPh 08/21/2020, 1:18 AM

## 2020-08-21 NOTE — Consult Note (Signed)
WOC Nurse Consult Note: Patient receiving care in [facility]. Reason for Consult: RLE cellulitis and lymphedema Wound type: crusted areas on RLE gaiter region, fluid filled bulla on dorsum of right foot. Small area on pretibial LLE that looks as if it will begin to weep and crust in the near future. No redness to LLE, but entire RLE is very noticeably larger than the LLE and erythematous Pressure Injury POA: Yes/No/NA Measurement: Wound bed: Drainage (amount, consistency, odor) none seen at time of my visit. Periwound: Dressing procedure/placement/frequency: Gently wash BLE and feet with soap and water. Thoroughly dry. Apply Sween Moisturizing Cream (pink and white tube in clean utility) to both legs and feet, but NOT between the toes. Apply Xeroform gauze Kellie Simmering [phone]) over any weeping areas or fluid filled areas. Then beginning beginning behind the toes and going to just below the knees, spiral wrap kerlix then 6 inch Ace wraps Kellie Simmering [phone]).  This will take at least 2 rolls of kerlix and 2 ace wraps for each leg.  Once discharged, the patient needs to be connected with a genuine lymphedema specialist if she has not already seen someone.  Monitor the wound area(s) for worsening of condition such as: Signs/symptoms of infection,  Increase in size,  Development of or worsening of odor, Development of pain, or increased pain at the affected locations.  Notify the medical team if any of these develop.  Thank you for the consult.  Discussed plan of care with the patient.  [facility] nurse will not follow at this time.  Please re-consult the [facility] team if needed.  Val Riles, RN, MSN, CWOCN, CNS-BC, pager [phone]

## 2020-08-21 NOTE — Progress Notes (Signed)
PROGRESS NOTE    Chrystina Naff  BHA:[id number] DOB: [birth year] DOA: 08/20/2020 PCP: Dorothyann Peng, NP   Chief Complain: Pain and redness of right leg  Brief Narrative: Patient is a 70 year old female with history of hypertension, lymphedema of bilateral lower extremities, history of non-Hodgkin's lymphoma who was sent from [facility] for direct admission for the management of right lower extremity cellulitis.  Patient reported swelling, erythematous changes, weeping drainage from the right leg.  She has limited mobility and cannot bear weight on her right leg, lives alone.  She has history of bilateral knee replacement  Assessment & Plan:   Principal Problem:   Cellulitis of right lower extremity Active Problems:   Essential hypertension   Morbid obesity (HCC)   Hypokalemia   AKI (acute kidney injury) ([place])   Cellulitis of right lower extremity: Presented with pain, swelling, erythematous changes, weeping drainage from the right leg.  No ulcers identified.  Started on vancomycin for coverage of nonpurulent cellulitis.  Continue blood cultures.  Continue pain management, supportive care. Venous Doppler negative for DVT  Bilateral lower extremity lymphedema: We strongly recommend to follow-up with lymphedema clinic as an outpatient  Hypertension: Continue home medications: Lotensin, metoprolol.  Monitor blood pressure  AKI: Mild elevation of creatinine from baseline.  Improved with rehydration.  Hypokalemia: Supplemented with potassium  Morbid obesity: BMI of 60.2  Debility/deconditioning: PT/OT will be consulted.           DVT prophylaxis:Lovenox Code Status: Full Family Communication: None at bedside Status is: Inpatient  Remains inpatient appropriate because:IV treatments appropriate due to intensity of illness or inability to take PO and Inpatient level of care appropriate due to severity of illness  Dispo: The patient is from: Home               Anticipated d/c is to: Home              Patient currently is not medically stable to d/c.   Difficult to place patient No     Consultants: None  Procedures:None  Antimicrobials:  Anti-infectives (From admission, onward)    Start     Dose/Rate Route Frequency Ordered Stop   08/21/20 1200  vancomycin (VANCOCIN) 2,500 mg in sodium chloride 0.9 % 500 mL IVPB        2,500 mg 250 mL/hr over 120 Minutes Intravenous Every 36 hours 08/21/20 0121     08/20/20 1615  vancomycin (VANCOCIN) IVPB 1000 mg/200 mL premix        1,000 mg 200 mL/hr over 60 Minutes Intravenous  Once 08/20/20 1609 08/20/20 1918       Subjective:  Patient seen and examined the bedside this morning.  Hemodynamically stable.  Lying in the bed.  She feels better today.  Denies new complaints   Objective: Vitals:   08/20/20 1900 08/20/20 2000 08/20/20 2141 08/21/20 0514  BP: 129/65 129/60 (!) 110/59 130/67  Pulse: 77 78 79 81  Resp: 14 19 18 17   Temp:   98 F (36.7 C) (!) 97.5 F (36.4 C)  TempSrc:    Oral  SpO2: 99% 100% 98% 98%  Weight:   (!) 169.3 kg   Height:   5\' 6"  (1.676 m)     Intake/Output Summary (Last 24 hours) at 08/21/2020 0844 Last data filed at 08/21/2020 0600 Gross per 24 hour  Intake 1053.06 ml  Output 300 ml  Net 753.06 ml   Filed Weights   08/20/20 1544 08/20/20 1736 08/20/20 2141  Weight: (!) 158.8 kg (!) 158.8 kg (!) 169.3 kg    Examination:  General exam: Appears calm and comfortable ,Not in distress, morbidly obese HEENT:PERRL,Oral mucosa moist, Ear/Nose normal on gross exam Respiratory system: Bilateral equal air entry, normal vesicular breath sounds, no wheezes or crackles  Cardiovascular system: S1 & S2 heard, RRR. No JVD, murmurs, rubs, gallops or clicks. No pedal edema. Gastrointestinal system: Abdomen is nondistended, soft and nontender. No organomegaly or masses felt. Normal bowel sounds heard. Central nervous system: Alert and oriented. No focal neurological  deficits. Extremities: Bilateral lower extremity lymphedema Skin: Erythematous discoloration of the right lower extremity, patchy area of erythematous changes on the left lower extremity, no active ulcers    Data Reviewed: I have personally reviewed following labs and imaging studies  CBC: Recent Labs  Lab 08/20/20 1716 08/21/20 0245  WBC 8.6 8.0  NEUTROABS 5.8  --   HGB 11.9* 11.6*  HCT 37.3 38.3  MCV 101.6* 105.5*  PLT 236 124   Basic Metabolic Panel: Recent Labs  Lab 08/20/20 1716 08/20/20 2253 08/21/20 0245  NA 140  --  140  K 3.0*  --  3.4*  CL 98  --  100  CO2 31  --  30  GLUCOSE 120*  --  151*  BUN 31*  --  28*  CREATININE 1.17*  --  1.06*  CALCIUM 9.2  --  8.7*  MG  --  1.7  --    GFR: Estimated Creatinine Clearance: 78.2 mL/min (A) (by C-G formula based on SCr of 1.06 mg/dL (H)). Liver Function Tests: Recent Labs  Lab 08/20/20 1716  AST 9*  ALT 16  ALKPHOS 59  BILITOT 0.6  PROT 7.1  ALBUMIN 3.6   No results for input(s): LIPASE, AMYLASE in the last 168 hours. No results for input(s): AMMONIA in the last 168 hours. Coagulation Profile: Recent Labs  Lab 08/20/20 1716  INR 1.0   Cardiac Enzymes: No results for input(s): CKTOTAL, CKMB, CKMBINDEX, TROPONINI in the last 168 hours. BNP (last 3 results) No results for input(s): PROBNP in the last 8760 hours. HbA1C: No results for input(s): HGBA1C in the last 72 hours. CBG: No results for input(s): GLUCAP in the last 168 hours. Lipid Profile: No results for input(s): CHOL, HDL, LDLCALC, TRIG, CHOLHDL, LDLDIRECT in the last 72 hours. Thyroid Function Tests: No results for input(s): TSH, T4TOTAL, FREET4, T3FREE, THYROIDAB in the last 72 hours. Anemia Panel: No results for input(s): VITAMINB12, FOLATE, FERRITIN, TIBC, IRON, RETICCTPCT in the last 72 hours. Sepsis Labs: Recent Labs  Lab 08/20/20 1718  LATICACIDVEN 1.0    Recent Results (from the past 240 hour(s))  Blood culture (routine  single)     Status: None (Preliminary result)   Collection Time: 08/20/20  5:15 PM   Specimen: BLOOD  Result Value Ref Range Status   Specimen Description   Final    BLOOD LEFT ANTECUBITAL Performed at [facility], [address], [place], [place] [address]    Special Requests   Final    Blood Culture adequate volume BOTTLES DRAWN AEROBIC AND ANAEROBIC Performed at [facility], [address], [place], [place] [address]    Culture   Final    NO GROWTH < 12 HOURS Performed at [facility] [address], [place],  [address]    Report Status PENDING  Incomplete  Resp Panel by RT-PCR (Flu A&B, Covid) Nasopharyngeal Swab     Status: None   Collection Time:  08/20/20  5:18 PM   Specimen: Nasopharyngeal Swab; Nasopharyngeal(NP) swabs in vial transport medium  Result Value Ref Range Status   SARS Coronavirus 2 by RT PCR NEGATIVE NEGATIVE Final    Comment: (NOTE) SARS-CoV-2 target nucleic acids are NOT DETECTED.  The SARS-CoV-2 RNA is generally detectable in upper respiratory specimens during the acute phase of infection. The lowest concentration of SARS-CoV-2 viral copies this assay can detect is 138 copies/mL. A negative result does not preclude SARS-Cov-2 infection and should not be used as the sole basis for treatment or other patient management decisions. A negative result may occur with  improper specimen collection/handling, submission of specimen other than nasopharyngeal swab, presence of viral mutation(s) within the areas targeted by this assay, and inadequate number of viral copies(<138 copies/mL). A negative result must be combined with clinical observations, patient history, and epidemiological information. The expected result is Negative.  Fact Sheet for Patients:  EntrepreneurPulse.com.au  Fact Sheet for Healthcare Providers:  IncredibleEmployment.be  This test is no t yet  approved or cleared by the [place] FDA and  has been authorized for detection and/or diagnosis of SARS-CoV-2 by FDA under an Emergency Use Authorization (EUA). This EUA will remain  in effect (meaning this test can be used) for the duration of the COVID-19 declaration under Section 564(b)(1) of the Act, 21 U.S.C.section 360bbb-3(b)(1), unless the authorization is terminated  or revoked sooner.       Influenza A by PCR NEGATIVE NEGATIVE Final   Influenza B by PCR NEGATIVE NEGATIVE Final    Comment: (NOTE) The Xpert Xpress SARS-CoV-2/FLU/RSV plus assay is intended as an aid in the diagnosis of influenza from Nasopharyngeal swab specimens and should not be used as a sole basis for treatment. Nasal washings and aspirates are unacceptable for Xpert Xpress SARS-CoV-2/FLU/RSV testing.  Fact Sheet for Patients: EntrepreneurPulse.com.au  Fact Sheet for Healthcare Providers: IncredibleEmployment.be  This test is not yet approved or cleared by the [place] FDA and has been authorized for detection and/or diagnosis of SARS-CoV-2 by FDA under an Emergency Use Authorization (EUA). This EUA will remain in effect (meaning this test can be used) for the duration of the COVID-19 declaration under Section 564(b)(1) of the Act, 21 U.S.C. section 360bbb-3(b)(1), unless the authorization is terminated or revoked.  Performed at [facility], [address], [place], [place] [address]          Radiology Studies: CT Angio Chest Pulmonary Embolism (PE) W or WO Contrast  Result Date: 08/21/2020 CLINICAL DATA:  Low O2 sats. EXAM: CT ANGIOGRAPHY CHEST WITH CONTRAST TECHNIQUE: Multidetector CT imaging of the chest was performed using the standard protocol during bolus administration of intravenous contrast. Multiplanar CT image reconstructions and MIPs were obtained to evaluate the vascular anatomy. CONTRAST:  154mL OMNIPAQUE IOHEXOL 350  MG/ML SOLN COMPARISON:  07/09/2015 FINDINGS: Cardiovascular: No filling defects in the pulmonary arteries to suggest pulmonary emboli. Heart is mildly enlarged. Aorta normal caliber. Scattered aortic calcifications. Mediastinum/Nodes: No mediastinal, hilar, or axillary adenopathy. Trachea and esophagus are unremarkable. Lungs/Pleura: Lungs are clear. No focal airspace opacities or suspicious nodules. No effusions. Upper Abdomen: Presumed large cysts within the right hepatic lobe partially imaged measuring up to 12 cm. Prior cholecystectomy. Musculoskeletal: Chest wall soft tissues are unremarkable. No acute bony abnormality. Review of the MIP images confirms the above findings. IMPRESSION: No evidence of pulmonary embolus. Mild cardiomegaly. No acute cardiopulmonary disease. Presumed large right hepatic cyst partially imaged on the lowest images measuring up to 12 cm.  This could be further evaluated with ultrasound or CT if felt clinically indicated. Aortic Atherosclerosis (ICD10-I70.0). Electronically Signed   By: Rolm Baptise M.D.   On: 08/21/2020 00:14   US Venous Img Lower Unilateral Right  Result Date: 08/20/2020 CLINICAL DATA:  69 year old with right foot redness and edema. EXAM: RIGHT LOWER EXTREMITY VENOUS DOPPLER ULTRASOUND TECHNIQUE: Gray-scale sonography with graded compression, as well as color Doppler and duplex ultrasound were performed to evaluate the lower extremity deep venous systems from the level of the common femoral vein and including the common femoral, femoral, profunda femoral, popliteal and calf veins including the posterior tibial, peroneal and gastrocnemius veins when visible. The superficial great saphenous vein was also interrogated. Spectral Doppler was utilized to evaluate flow at rest and with distal augmentation maneuvers in the common femoral, femoral and popliteal veins. COMPARISON:  None. FINDINGS: Contralateral Common Femoral Vein: No evidence of thrombus. Normal  compressibility and color Doppler flow. Common Femoral Vein: No evidence of thrombus. Normal compressibility, respiratory phasicity and response to augmentation. Saphenofemoral Junction: No evidence of thrombus. Normal compressibility and flow on color Doppler imaging. Profunda Femoral Vein: No evidence of thrombus. Normal compressibility and flow on color Doppler imaging. Femoral Vein: No evidence of thrombus. Normal compressibility, respiratory phasicity and response to augmentation. Popliteal Vein: No evidence of thrombus. Normal compressibility, respiratory phasicity and response to augmentation. Calf Veins: Visualized right deep calf veins are patent without thrombus. Limited evaluation. Superficial Great Saphenous Vein: No evidence of thrombus. Normal compressibility. Other Findings: Prominent right inguinal lymph node measuring 5.1 x 2.3 x 3.4 cm. Lymph node is slightly lobulated. IMPRESSION: 1. Negative for deep venous thrombosis in right lower extremity. Study has technical limitations due to the edema and body habitus. 2. Prominent right inguinal lymph node is likely reactive based on history and right lower extremity edema. Electronically Signed   By: Markus Daft M.D.   On: 08/20/2020 17:16   DG Chest Port 1 View  Result Date: 08/20/2020 CLINICAL DATA:  70 year old female with concern for sepsis. EXAM: PORTABLE CHEST 1 VIEW COMPARISON:  Chest radiograph dated 07/09/2015. FINDINGS: No focal consolidation, pleural effusion, or pneumothorax. The cardiac silhouette is within limits. No acute osseous pathology. IMPRESSION: No active disease. Electronically Signed   By: Anner Crete M.D.   On: 08/20/2020 16:54        Scheduled Meds:  benazepril  20 mg Oral Daily   cholecalciferol  1,000 Units Oral Daily   enoxaparin (LOVENOX) injection  1 mg/kg Subcutaneous Q12H   hydrochlorothiazide  25 mg Oral Daily   metoprolol tartrate  50 mg Oral BID   sodium chloride (PF)       Continuous Infusions:   lactated ringers 100 mL/hr at 08/20/20 2311   vancomycin       LOS: 1 day    Time spent: 35 mins.More than 50% of that time was spent in counseling and/or coordination of care.      Shelly Coss, MD [facility] P7/02/2020, 8:44 AM

## 2020-08-21 NOTE — Telephone Encounter (Signed)
Noted  

## 2020-08-21 NOTE — Plan of Care (Signed)

## 2020-08-21 NOTE — Evaluation (Signed)
Physical Therapy Evaluation Patient Details Name: Allison Short MRN: [id number] DOB: 19-Mar-1950 Today's Date: 08/21/2020   History of Present Illness  Pt admitted with R LE cellulitus, hypokalemia and AKI and with hx of bil TKR (L one doing well but R has been difficult and continues with weakness and ROM limitations), and non-hodgkins lymphoma  Clinical Impression  Pt admitted as above and presenting with functional mobility limitations 2* LE weakness (R weaker than L since bil TKR), obesity, and R LE pain.  Pt hopes to progress to dc home with limited assist of son and would benefit from HHPT follow up and [facility] aide.  Prior to admit, pt had declined to point of sleeping in recliner as unable to get in/out of bed without assist and states has not been in the shower for ~1 year.    Follow Up Recommendations Home health PT;Other (comment) (Pt would benefit from [facility] aide for assist with bathing/ADL)    Equipment Recommendations  None recommended by PT    Recommendations for Other Services OT consult     Precautions / Restrictions Precautions Precautions: Fall Restrictions Weight Bearing Restrictions: No      Mobility  Bed Mobility Overal bed mobility: Needs Assistance Bed Mobility: Supine to Sit;Sit to Supine     Supine to sit: Min assist;+2 for physical assistance Sit to supine: Mod assist   General bed mobility comments: assist with R LE and to bring trunk to upright on bed exit; assist to manage bil LEs back into bed    Transfers Overall transfer level: Needs assistance Equipment used: Rolling walker (2 wheeled) Transfers: Sit to/from [employer] Transfers Sit to Stand: Min assist;+2 safety/equipment;+2 physical assistance;From elevated surface Stand pivot transfers: Min assist;+2 safety/equipment;From elevated surface       General transfer comment: cues for LE management and use of UEs to self assist.  Pt utilizing momentum to bring wt up and fwd from elevated bedside  and from recliner  Ambulation/Gait Ambulation/Gait assistance: Min guard;+2 safety/equipment Gait Distance (Feet): 38 Feet Assistive device: Rolling walker (2 wheeled) Gait Pattern/deviations: Step-to pattern;Decreased step length - right;Decreased step length - left;Shuffle;Trunk flexed Gait velocity: decr   General Gait Details: Increased time with steady assist and multiple standing rest breaks.  Stairs            Wheelchair Mobility    Modified Rankin (Stroke Patients Only)       Balance Overall balance assessment: Needs assistance Sitting-balance support: No upper extremity supported;Feet supported Sitting balance-Leahy Scale: Good     Standing balance support: No upper extremity supported Standing balance-Leahy Scale: Fair                               Pertinent Vitals/Pain Pain Assessment: 0-10 Pain Score: 4  Pain Location: R LE up to and including groin pain Pain Descriptors / Indicators: Aching;Grimacing;Guarding Pain Intervention(s): Limited activity within patient's tolerance;Monitored during session;Premedicated before session    Home Living Family/patient expects to be discharged to:: Private residence Living Arrangements: Alone Available Help at Discharge: Available PRN/intermittently;Family Type of Home: Apartment Home Access: Level entry     Home Layout: One level Home Equipment: [place] - 4 wheels;Other (comment) (commode frame) Additional Comments: Son is currently staying with pt but works and is currently undergoing CA tx.    Prior Function Level of Independence: Independent with assistive device(s)         Comments: used rollator for mobility but  becoming increasingly debilitated - sleeping in recliner since unable to get in and out of bed and has not showered for ~ a year as unable to safely get in/out of tub/shower combo     Hand Dominance        Extremity/Trunk Assessment   Upper Extremity Assessment Upper  Extremity Assessment: Overall WFL for tasks assessed    Lower Extremity Assessment Lower Extremity Assessment: RLE deficits/detail;LLE deficits/detail;Generalized weakness RLE: Unable to fully assess due to pain LLE Deficits / Details: Unable to bring leg onto bed without assist    Cervical / Trunk Assessment Cervical / Trunk Assessment: Normal  Communication   Communication: No difficulties  Cognition Arousal/Alertness: Awake/alert Behavior During Therapy: WFL for tasks assessed/performed Overall Cognitive Status: Within Functional Limits for tasks assessed                                        General Comments      Exercises     Assessment/Plan    PT Assessment Patient needs continued PT services  PT Problem List Decreased strength;Decreased range of motion;Decreased activity tolerance;Decreased balance;Decreased mobility;Obesity;Pain       PT Treatment Interventions DME instruction;Gait training;Functional mobility training;Therapeutic activities;Therapeutic exercise;Balance training;Patient/family education    PT Goals (Current goals can be found in the Care Plan section)  Acute Rehab PT Goals Patient Stated Goal: Regain INDq PT Goal Formulation: With patient Time For Goal Achievement: 09/04/20 Potential to Achieve Goals: Good    Frequency Min 3X/week   Barriers to discharge Decreased caregiver support Limited assist at home    Co-evaluation               AM-PAC PT "6 Clicks" Mobility  Outcome Measure Help needed turning from your back to your side while in a flat bed without using bedrails?: A Lot Help needed moving from lying on your back to sitting on the side of a flat bed without using bedrails?: A Lot Help needed moving to and from a bed to a chair (including a wheelchair)?: A Lot Help needed standing up from a chair using your arms (e.g., wheelchair or bedside chair)?: A Little Help needed to walk in hospital room?: A  Little Help needed climbing 3-5 steps with a railing? : A Lot 6 Click Score: 14    End of Session   Activity Tolerance: Patient limited by fatigue;Patient limited by pain Patient left: in bed;with call bell/phone within reach Nurse Communication: Mobility status PT Visit Diagnosis: Unsteadiness on feet (R26.81);Muscle weakness (generalized) (M62.81);Difficulty in walking, not elsewhere classified (R26.2);Pain Pain - Right/Left: Right Pain - part of body: Leg    Time: 1017-1100 PT Time Calculation (min) (ACUTE ONLY): 43 min   Charges:   PT Evaluation $PT Eval Low Complexity: 1 Low PT Treatments $Gait Training: 8-22 mins $Therapeutic Activity: 8-22 mins        Debe Coder PT Acute Rehabilitation Services Pager [phone] Office [phone]   Treyvonne Tata 08/21/2020, 12:48 PM

## 2020-08-21 NOTE — Progress Notes (Signed)
Dressing to BLE changed per order, Pt tolerated well.

## 2020-08-21 NOTE — Progress Notes (Signed)
Brief Pharmacy note: Lovenox for VTE prophylaxis  Ruled out PE, received 1 dose Lovenox 170mg  on 6/30 pm  Lovenox reduced to 80mg  sq q24 for VTE prophylaxis  Minda Ditto PharmD 08/21/2020, 8:54 AM

## 2020-08-22 LAB — BASIC METABOLIC PANEL
Anion gap: 7 (ref 5–15)
BUN: 21 mg/dL (ref 8–23)
CO2: 32 mmol/L (ref 22–32)
Calcium: 8.5 mg/dL — ABNORMAL LOW (ref 8.9–10.3)
Chloride: 99 mmol/L (ref 98–111)
Creatinine, Ser: 1.02 mg/dL — ABNORMAL HIGH (ref 0.44–1.00)
GFR, Estimated: 58 mL/min — ABNORMAL LOW (ref >60)
Glucose, Bld: 124 mg/dL — ABNORMAL HIGH (ref 70–99)
Potassium: 3.9 mmol/L (ref 3.5–5.1)
Sodium: 138 mmol/L (ref 135–145)

## 2020-08-22 LAB — URINE CULTURE

## 2020-08-22 MED ORDER — SALINE SPRAY 0.65 % NA SOLN
1.0000 | NASAL | Status: DC | PRN
  Administered 2020-08-22: 1 via NASAL
  Filled 2020-08-22 (×2): qty 44

## 2020-08-22 MED ORDER — VANCOMYCIN HCL 1750 MG/350ML IV SOLN
1750.0000 mg | INTRAVENOUS | Status: DC
  Administered 2020-08-22 – 2020-08-26 (×5): 1750 mg via INTRAVENOUS
  Filled 2020-08-22 (×5): qty 350

## 2020-08-22 MED ORDER — BENZONATATE 100 MG PO CAPS
200.0000 mg | ORAL_CAPSULE | Freq: Three times a day (TID) | ORAL | Status: DC | PRN
  Administered 2020-08-22 – 2020-08-26 (×10): 200 mg via ORAL
  Filled 2020-08-22 (×10): qty 2

## 2020-08-22 MED ORDER — GUAIFENESIN-DM 100-10 MG/5ML PO SYRP
5.0000 mL | ORAL_SOLUTION | ORAL | Status: DC | PRN
  Administered 2020-08-22 – 2020-08-24 (×4): 5 mL via ORAL
  Filled 2020-08-22 (×5): qty 10

## 2020-08-22 MED ORDER — LEVALBUTEROL HCL 0.63 MG/3ML IN NEBU
0.6300 mg | INHALATION_SOLUTION | Freq: Three times a day (TID) | RESPIRATORY_TRACT | Status: DC | PRN
  Administered 2020-08-22 – 2020-08-25 (×9): 0.63 mg via RESPIRATORY_TRACT
  Filled 2020-08-22 (×9): qty 3

## 2020-08-22 NOTE — Evaluation (Signed)
Occupational Therapy Evaluation Patient Details Name: Allison Short MRN: [id number] DOB: May 01, 1950 Today's Date: 08/22/2020    History of Present Illness Pt admitted with R LE cellulitus, hypokalemia and AKI and with hx of bil TKR (L one doing well but R has been difficult and continues with weakness and ROM limitations), and non-hodgkins lymphoma   Clinical Impression   Allison Short is a 70 year old woman who presents with complaints of pain, decreased activity tolerance, impaired balance and obesity with decreased ability to perform her baseline ADLs and mobility. Today patinet min assist for supine to sit and only able to tolerate standing and transferring to recliner predominantly limited by fear but also due to pain. Patient able to perform UB ADLs with setup and seated position but needing max assist for LB ADLs and toileting. Patient will benefit from skilled OT services while in hospital to improve deficits and learn compensatory strategies as needed in order to return to PLOF. Patient wants to return home at discharge - stating she can function well in her normal environment. Patient lives alone - her son has been staying with her for his own convenience and is not a caregiver and not home during the day. If patient's pain improves and she exhibits the ability to perform ADLs return home will be fine. IF not - short term rehab might be necessarily. Will tentatively recommend HH OT at discharge.      Follow Up Recommendations  Home health OT    Equipment Recommendations  None recommended by OT    Recommendations for Other Services       Precautions / Restrictions Precautions Precautions: Fall Restrictions Weight Bearing Restrictions: No      Mobility Bed Mobility Overal bed mobility: Needs Assistance Bed Mobility: Supine to Sit     Supine to sit: Min assist;HOB elevated     General bed mobility comments: Min assist for RLE - patient needed use of bed rails to assist with  rest of transfer and scooting forward.    Transfers Overall transfer level: Needs assistance Equipment used: Rolling walker (2 wheeled) Transfers: Sit to/from [facility] Sit to Stand: Min assist;From elevated surface Stand pivot transfers: Min assist       General transfer comment: Patient fearful today with standing and transferring to recliner - limiting mobility. Patinet min assist with RW. limited by RLE pain. Patient seated in recliner but patient not comfortable.    Balance Overall balance assessment: Needs assistance Sitting-balance support: No upper extremity supported;Feet supported Sitting balance-Leahy Scale: Good     Standing balance support: No upper extremity supported Standing balance-Leahy Scale: Fair                             ADL either performed or assessed with clinical judgement   ADL Overall ADL's : Needs assistance/impaired Eating/Feeding: Independent   Grooming: Set up;Sitting;Oral care   Upper Body Bathing: Set up;Sitting   Lower Body Bathing: Maximal assistance;Sit to/from stand;Set up   Upper Body Dressing : Set up;Sitting   Lower Body Dressing: Maximal assistance;Sit to/from stand   Toilet Transfer: Minimal assistance;BSC;RW;Stand-pivot   Toileting- Clothing Manipulation and Hygiene: Maximal assistance;Sit to/from stand               Vision Patient Visual Report: No change from baseline       Perception     Praxis      Pertinent Vitals/Pain Pain Assessment: Faces Pain Score:  8  Faces Pain Scale: Hurts whole lot Pain Location: R thigh/back of leg Pain Descriptors / Indicators: Aching;Grimacing;Guarding Pain Intervention(s): Limited activity within patient's tolerance;Patient requesting pain meds-RN notified;Monitored during session;Repositioned     Hand Dominance     Extremity/Trunk Assessment Upper Extremity Assessment Upper Extremity Assessment: Overall WFL for tasks assessed   Lower  Extremity Assessment Lower Extremity Assessment: Defer to PT evaluation   Cervical / Trunk Assessment Cervical / Trunk Assessment: Normal   Communication     Cognition Arousal/Alertness: Awake/alert Behavior During Therapy: WFL for tasks assessed/performed Overall Cognitive Status: Within Functional Limits for tasks assessed                                     General Comments       Exercises     Shoulder Instructions      Home Living                                          Prior Functioning/Environment                   OT Problem List: Pain;Obesity;Decreased activity tolerance;Impaired balance (sitting and/or standing);Decreased knowledge of use of DME or AE      OT Treatment/Interventions: Self-care/ADL training;DME and/or AE instruction;Therapeutic activities;Patient/family education;Balance training    OT Goals(Current goals can be found in the care plan section) Acute Rehab OT Goals Patient Stated Goal: return home at discharge OT Goal Formulation: With patient Time For Goal Achievement: 09/05/20 Potential to Achieve Goals: Fair  OT Frequency: Min 2X/week   Barriers to D/C:            Co-evaluation              AM-PAC OT "6 Clicks" Daily Activity     Outcome Measure Help from another person eating meals?: None Help from another person taking care of personal grooming?: A Little Help from another person toileting, which includes using toliet, bedpan, or urinal?: A Lot Help from another person bathing (including washing, rinsing, drying)?: A Lot Help from another person to put on and taking off regular upper body clothing?: A Little Help from another person to put on and taking off regular lower body clothing?: A Lot 6 Click Score: 16   End of Session Equipment Utilized During Treatment: Gait belt;Rolling walker Nurse Communication: Mobility status  Activity Tolerance: Patient tolerated treatment  well Patient left: in chair;with call bell/phone within reach  OT Visit Diagnosis: Pain;Other abnormalities of gait and mobility (R26.89)                Time: 4627-0350 OT Time Calculation (min): 36 min Charges:  OT General Charges $OT Visit: 1 Visit OT Evaluation $OT Eval Moderate Complexity: 1 Mod OT Treatments $Self Care/Home Management : 8-22 mins  Angelee Bahr, OTR/L [place]  Office [phone] Pager: [place] 08/22/2020, 1:27 PM

## 2020-08-22 NOTE — Progress Notes (Signed)
PROGRESS NOTE    Allison Short  WCB:[id number] DOB: 1951-02-19 DOA: 08/20/2020 PCP: Dorothyann Peng, NP   Chief Complain: Pain and redness of right leg  Brief Narrative: Patient is a 70 year old female with history of hypertension, lymphedema of bilateral lower extremities, history of non-Hodgkin's lymphoma who was sent from [facility] for direct admission for the management of right lower extremity cellulitis.  Patient reported swelling, erythematous changes, weeping drainage from the right leg.  She has limited mobility and cannot bear weight on her right leg, lives alone.  She has history of bilateral knee replacement.  Started on IV vancomycin, wound care consulted  Assessment & Plan:   Principal Problem:   Cellulitis of right lower extremity Active Problems:   Essential hypertension   Morbid obesity (HCC)   Hypokalemia   AKI (acute kidney injury) ([place])   Cellulitis of right lower extremity: Presented with pain, swelling, erythematous changes, weeping drainage from the right leg.  No ulcers identified.  Started on vancomycin for coverage of nonpurulent cellulitis.  Follow-up blood cultures, no growth till date.  Continue pain management, supportive care. Venous Doppler negative for DVT Wound care nurse was consulted.  Legs were wrapped with Kerlix and Ace wraps Continue IV antibiotics for today.  Right leg still looks significantly erythematous but might have improved somewhat in comparison to yesterday.  Bilateral lower extremity lymphedema: We strongly recommend to follow-up with lymphedema clinic as an outpatient  Hypertension: Continue home medications: Lotensin, metoprolol.  Monitor blood pressure  AKI: Mild elevation of creatinine from baseline.  Improved with rehydration.  Hypokalemia: Supplemented with potassium  Morbid obesity: BMI of 60.2  Debility/deconditioning: PT/OT consulted, recommended home health on discharge          DVT  prophylaxis:Lovenox Code Status: Full Family Communication: None at bedside Status is: Inpatient  Remains inpatient appropriate because:IV treatments appropriate due to intensity of illness or inability to take PO and Inpatient level of care appropriate due to severity of illness  Dispo: The patient is from: Home              Anticipated d/c is to: Home              Patient currently is not medically stable to d/c.   Difficult to place patient No     Consultants: None  Procedures:None  Antimicrobials:  Anti-infectives (From admission, onward)    Start     Dose/Rate Route Frequency Ordered Stop   08/21/20 1200  vancomycin (VANCOCIN) 2,500 mg in sodium chloride 0.9 % 500 mL IVPB        2,500 mg 250 mL/hr over 120 Minutes Intravenous Every 36 hours 08/21/20 0121     08/20/20 1615  vancomycin (VANCOCIN) IVPB 1000 mg/200 mL premix        1,000 mg 200 mL/hr over 60 Minutes Intravenous  Once 08/20/20 1609 08/20/20 1918       Subjective:  Patient seen and examined the bedside this morning.  Hemodynamically stable.  Lying on bed.  Bilateral legs wrapped with dressings.  Complains of back pain today.   Objective: Vitals:   08/21/20 2033 08/21/20 2033 08/22/20 0143 08/22/20 0448  BP: 139/71 139/71  (!) 130/55  Pulse: 82 82  71  Resp:  20  16  Temp:  99.1 F (37.3 C)  98.4 F (36.9 C)  TempSrc:  Oral  Oral  SpO2:  98% 99% 98%  Weight:      Height:  Intake/Output Summary (Last 24 hours) at 08/22/2020 1123 Last data filed at 08/22/2020 1030 Gross per 24 hour  Intake 2015.89 ml  Output 1650 ml  Net 365.89 ml   Filed Weights   08/20/20 1544 08/20/20 1736 08/20/20 2141  Weight: (!) 158.8 kg (!) 158.8 kg (!) 169.3 kg    Examination:  General exam: Overall comfortable, not in distress, morbidly obese HEENT: PERRL Respiratory system:  no wheezes or crackles  Cardiovascular system: S1 & S2 heard, RRR.  Gastrointestinal system: Abdomen is nondistended, soft and  nontender. Central nervous system: Alert and oriented Extremities: B/L lymphadema, no clubbing ,no cyanosis Skin: Erythematous discoloration of the right lower extremity, patchy area of erythematous changes on the left lower extremity, no active ulcers.  Bilateral legs wrapped with dressings    Data Reviewed: I have personally reviewed following labs and imaging studies  CBC: Recent Labs  Lab 08/20/20 1716 08/21/20 0245  WBC 8.6 8.0  NEUTROABS 5.8  --   HGB 11.9* 11.6*  HCT 37.3 38.3  MCV 101.6* 105.5*  PLT 236 812   Basic Metabolic Panel: Recent Labs  Lab 08/20/20 1716 08/20/20 2253 08/21/20 0245 08/22/20 0303  NA 140  --  140 138  K 3.0*  --  3.4* 3.9  CL 98  --  100 99  CO2 31  --  30 32  GLUCOSE 120*  --  151* 124*  BUN 31*  --  28* 21  CREATININE 1.17*  --  1.06* 1.02*  CALCIUM 9.2  --  8.7* 8.5*  MG  --  1.7  --   --    GFR: Estimated Creatinine Clearance: 81.3 mL/min (A) (by C-G formula based on SCr of 1.02 mg/dL (H)). Liver Function Tests: Recent Labs  Lab 08/20/20 1716  AST 9*  ALT 16  ALKPHOS 59  BILITOT 0.6  PROT 7.1  ALBUMIN 3.6   No results for input(s): LIPASE, AMYLASE in the last 168 hours. No results for input(s): AMMONIA in the last 168 hours. Coagulation Profile: Recent Labs  Lab 08/20/20 1716  INR 1.0   Cardiac Enzymes: No results for input(s): CKTOTAL, CKMB, CKMBINDEX, TROPONINI in the last 168 hours. BNP (last 3 results) No results for input(s): PROBNP in the last 8760 hours. HbA1C: No results for input(s): HGBA1C in the last 72 hours. CBG: No results for input(s): GLUCAP in the last 168 hours. Lipid Profile: No results for input(s): CHOL, HDL, LDLCALC, TRIG, CHOLHDL, LDLDIRECT in the last 72 hours. Thyroid Function Tests: No results for input(s): TSH, T4TOTAL, FREET4, T3FREE, THYROIDAB in the last 72 hours. Anemia Panel: No results for input(s): VITAMINB12, FOLATE, FERRITIN, TIBC, IRON, RETICCTPCT in the last 72  hours. Sepsis Labs: Recent Labs  Lab 08/20/20 1718  LATICACIDVEN 1.0    Recent Results (from the past 240 hour(s))  Blood culture (routine single)     Status: None (Preliminary result)   Collection Time: 08/20/20  5:15 PM   Specimen: BLOOD  Result Value Ref Range Status   Specimen Description   Final    BLOOD LEFT ANTECUBITAL Performed at [facility], [address], [place], [place] [address]    Special Requests   Final    Blood Culture adequate volume BOTTLES DRAWN AEROBIC AND ANAEROBIC Performed at [facility], [address] [place], [place] [address]    Culture   Final    NO GROWTH 2 DAYS Performed at [facility], [address] [place], [place] [address]  Report Status PENDING  Incomplete  Resp Panel by RT-PCR (Flu A&B, Covid) Nasopharyngeal Swab     Status: None   Collection Time: 08/20/20  5:18 PM   Specimen: Nasopharyngeal Swab; Nasopharyngeal(NP) swabs in vial transport medium  Result Value Ref Range Status   SARS Coronavirus 2 by RT PCR NEGATIVE NEGATIVE Final    Comment: (NOTE) SARS-CoV-2 target nucleic acids are NOT DETECTED.  The SARS-CoV-2 RNA is generally detectable in upper respiratory specimens during the acute phase of infection. The lowest concentration of SARS-CoV-2 viral copies this assay can detect is 138 copies/mL. A negative result does not preclude SARS-Cov-2 infection and should not be used as the sole basis for treatment or other patient management decisions. A negative result may occur with  improper specimen collection/handling, submission of specimen other than nasopharyngeal swab, presence of viral mutation(s) within the areas targeted by this assay, and inadequate number of viral copies(<138 copies/mL). A negative result must be combined with clinical observations, patient history, and epidemiological information. The expected result is Negative.  Fact Sheet for Patients:   EntrepreneurPulse.com.au  Fact Sheet for Healthcare Providers:  IncredibleEmployment.be  This test is no t yet approved or cleared by the [place] FDA and  has been authorized for detection and/or diagnosis of SARS-CoV-2 by FDA under an Emergency Use Authorization (EUA). This EUA will remain  in effect (meaning this test can be used) for the duration of the COVID-19 declaration under Section 564(b)(1) of the Act, 21 U.S.C.section 360bbb-3(b)(1), unless the authorization is terminated  or revoked sooner.       Influenza A by PCR NEGATIVE NEGATIVE Final   Influenza B by PCR NEGATIVE NEGATIVE Final    Comment: (NOTE) The Xpert Xpress SARS-CoV-2/FLU/RSV plus assay is intended as an aid in the diagnosis of influenza from Nasopharyngeal swab specimens and should not be used as a sole basis for treatment. Nasal washings and aspirates are unacceptable for Xpert Xpress SARS-CoV-2/FLU/RSV testing.  Fact Sheet for Patients: EntrepreneurPulse.com.au  Fact Sheet for Healthcare Providers: IncredibleEmployment.be  This test is not yet approved or cleared by the [place] FDA and has been authorized for detection and/or diagnosis of SARS-CoV-2 by FDA under an Emergency Use Authorization (EUA). This EUA will remain in effect (meaning this test can be used) for the duration of the COVID-19 declaration under Section 564(b)(1) of the Act, 21 U.S.C. section 360bbb-3(b)(1), unless the authorization is terminated or revoked.  Performed at [facility], [address], [place], [place] [address]          Radiology Studies: CT Angio Chest Pulmonary Embolism (PE) W or WO Contrast  Result Date: 08/21/2020 CLINICAL DATA:  Low O2 sats. EXAM: CT ANGIOGRAPHY CHEST WITH CONTRAST TECHNIQUE: Multidetector CT imaging of the chest was performed using the standard protocol during bolus administration of  intravenous contrast. Multiplanar CT image reconstructions and MIPs were obtained to evaluate the vascular anatomy. CONTRAST:  148mL OMNIPAQUE IOHEXOL 350 MG/ML SOLN COMPARISON:  07/09/2015 FINDINGS: Cardiovascular: No filling defects in the pulmonary arteries to suggest pulmonary emboli. Heart is mildly enlarged. Aorta normal caliber. Scattered aortic calcifications. Mediastinum/Nodes: No mediastinal, hilar, or axillary adenopathy. Trachea and esophagus are unremarkable. Lungs/Pleura: Lungs are clear. No focal airspace opacities or suspicious nodules. No effusions. Upper Abdomen: Presumed large cysts within the right hepatic lobe partially imaged measuring up to 12 cm. Prior cholecystectomy. Musculoskeletal: Chest wall soft tissues are unremarkable. No acute bony abnormality. Review of the MIP images confirms the above findings. IMPRESSION: No evidence  of pulmonary embolus. Mild cardiomegaly. No acute cardiopulmonary disease. Presumed large right hepatic cyst partially imaged on the lowest images measuring up to 12 cm. This could be further evaluated with ultrasound or CT if felt clinically indicated. Aortic Atherosclerosis (ICD10-I70.0). Electronically Signed   By: Rolm Baptise M.D.   On: 08/21/2020 00:14   US Venous Img Lower Unilateral Right  Result Date: 08/20/2020 CLINICAL DATA:  70 year old with right foot redness and edema. EXAM: RIGHT LOWER EXTREMITY VENOUS DOPPLER ULTRASOUND TECHNIQUE: Gray-scale sonography with graded compression, as well as color Doppler and duplex ultrasound were performed to evaluate the lower extremity deep venous systems from the level of the common femoral vein and including the common femoral, femoral, profunda femoral, popliteal and calf veins including the posterior tibial, peroneal and gastrocnemius veins when visible. The superficial great saphenous vein was also interrogated. Spectral Doppler was utilized to evaluate flow at rest and with distal augmentation maneuvers in  the common femoral, femoral and popliteal veins. COMPARISON:  None. FINDINGS: Contralateral Common Femoral Vein: No evidence of thrombus. Normal compressibility and color Doppler flow. Common Femoral Vein: No evidence of thrombus. Normal compressibility, respiratory phasicity and response to augmentation. Saphenofemoral Junction: No evidence of thrombus. Normal compressibility and flow on color Doppler imaging. Profunda Femoral Vein: No evidence of thrombus. Normal compressibility and flow on color Doppler imaging. Femoral Vein: No evidence of thrombus. Normal compressibility, respiratory phasicity and response to augmentation. Popliteal Vein: No evidence of thrombus. Normal compressibility, respiratory phasicity and response to augmentation. Calf Veins: Visualized right deep calf veins are patent without thrombus. Limited evaluation. Superficial Great Saphenous Vein: No evidence of thrombus. Normal compressibility. Other Findings: Prominent right inguinal lymph node measuring 5.1 x 2.3 x 3.4 cm. Lymph node is slightly lobulated. IMPRESSION: 1. Negative for deep venous thrombosis in right lower extremity. Study has technical limitations due to the edema and body habitus. 2. Prominent right inguinal lymph node is likely reactive based on history and right lower extremity edema. Electronically Signed   By: Markus Daft M.D.   On: 08/20/2020 17:16   DG Chest Port 1 View  Result Date: 08/20/2020 CLINICAL DATA:  70 year old female with concern for sepsis. EXAM: PORTABLE CHEST 1 VIEW COMPARISON:  Chest radiograph dated 07/09/2015. FINDINGS: No focal consolidation, pleural effusion, or pneumothorax. The cardiac silhouette is within limits. No acute osseous pathology. IMPRESSION: No active disease. Electronically Signed   By: Anner Crete M.D.   On: 08/20/2020 16:54        Scheduled Meds:  benazepril  20 mg Oral Daily   cholecalciferol  1,000 Units Oral Daily   enoxaparin (LOVENOX) injection  80 mg  Subcutaneous Q24H   hydrochlorothiazide  25 mg Oral Daily   metoprolol tartrate  50 mg Oral BID   Continuous Infusions:  vancomycin Stopped (08/21/20 1431)     LOS: 2 days    Time spent: 35 mins.More than 50% of that time was spent in counseling and/or coordination of care.      Shelly Coss, MD [facility] P7/03/2020, 11:23 AM

## 2020-08-22 NOTE — TOC Progression Note (Signed)
Transition of Care [facility]) - Progression Note    Patient Details  Name: Allison Short MRN: [id number] Date of Birth: February 01, 1951  Transition of [facility]) CM/SW Contact  Leeroy Cha, RN Phone Number: 08/22/2020, 3:20 PM  Clinical Narrative:    Tct-margaret interim/ will start on Monday with pt and ot.        Expected Discharge Plan and Services                                                 Social Determinants of Health (SDOH) Interventions    Readmission Risk Interventions No flowsheet data found.

## 2020-08-22 NOTE — Progress Notes (Signed)
Pharmacy Antibiotic Note  Allison Short is a 70 y.o. female admitted on 08/20/2020 with  purulent cellulitis .  Pharmacy has been consulted for vancomycin dosing.  Today, 08/22/20 WBC WNL SCr 1.02, CrCl ~80 mL/min Afebrile  Today is day #3 of IV antibiotics  Plan: Change dose to vancomycin 1750 mg IV q24h for estimated AUC of 480 Goal vancomycin AUC 400-550 Monitor renal function. Check vancomycin levels at steady state if needed  Height: 5\' 6"  (167.6 cm) Weight: (!) 169.3 kg (373 lb 3.8 oz) IBW/kg (Calculated) : 59.3  Temp (24hrs), Avg:98.6 F (37 C), Min:98.4 F (36.9 C), Max:99.1 F (37.3 C)  Recent Labs  Lab 08/20/20 1716 08/20/20 1718 08/21/20 0245 08/22/20 0303  WBC 8.6  --  8.0  --   CREATININE 1.17*  --  1.06* 1.02*  LATICACIDVEN  --  1.0  --   --     Estimated Creatinine Clearance: 81.3 mL/min (A) (by C-G formula based on SCr of 1.02 mg/dL (H)).    Allergies  Allergen Reactions   Ciprofloxin Hcl [Ciprofloxacin] Other (See Comments)    Unstable gait   Morphine And Related Anaphylaxis    Tolerates hydrocodone   Asa [Aspirin] Hives   Codeine Hives   Nsaids     Lowers GFR   Penicillins Other (See Comments)    Patient does not know      Antimicrobials this admission: vancomycin 6/30 >>   Dose adjustments this admission: 7/2 Vanc 2500 q36 >> 1750 q24  Microbiology results: 6/30 BCx: ngtd   Thank you for allowing pharmacy to be a part of this patient's care.  Lenis Noon, PharmD 08/22/2020 1:13 PM

## 2020-08-23 LAB — CREATININE, SERUM
Creatinine, Ser: 1.17 mg/dL — ABNORMAL HIGH (ref 0.44–1.00)
GFR, Estimated: 50 mL/min — ABNORMAL LOW (ref >60)

## 2020-08-23 MED ORDER — MENTHOL 3 MG MT LOZG
1.0000 | LOZENGE | OROMUCOSAL | Status: DC | PRN
  Administered 2020-08-23 – 2020-08-24 (×2): 3 mg via ORAL
  Filled 2020-08-23 (×3): qty 9

## 2020-08-23 NOTE — Plan of Care (Addendum)
17:50 Pt complained of SOB, O2 sat on room air is 97%, dangled on edge of bed then ambulated to chair. Rechecked O2 sat while sitting on the chair, 94% on room air. Pt stated she feels so much better, Pt not in acute distress. RN encouraged Pt to continue using IS. Pt now resting comfortably on the chair, RN will continue to monitor.  Problem: Clinical Measurements: Goal: Ability to maintain clinical measurements within normal limits will improve Outcome: Progressing   Problem: Activity: Goal: Risk for activity intolerance will decrease Outcome: Progressing   Problem: Nutrition: Goal: Adequate nutrition will be maintained Outcome: Progressing   Problem: Coping: Goal: Level of anxiety will decrease Outcome: Progressing   Problem: Elimination: Goal: Will not experience complications related to bowel motility Outcome: Progressing   Problem: Pain Managment: Goal: General experience of comfort will improve Outcome: Progressing   Problem: Safety: Goal: Ability to remain free from injury will improve Outcome: Progressing   Problem: Skin Integrity: Goal: Risk for impaired skin integrity will decrease Outcome: Progressing

## 2020-08-23 NOTE — Progress Notes (Signed)
PROGRESS NOTE    Allison Short  DVV:[id number] DOB: [birth year] DOA: 08/20/2020 PCP: Dorothyann Peng, NP   Chief Complain: Pain and redness of right leg  Brief Narrative: Patient is a 70 year old female with history of hypertension, lymphedema of bilateral lower extremities, history of non-Hodgkin's lymphoma who was sent from [facility] for direct admission for the management of right lower extremity cellulitis.  Patient reported swelling, erythematous changes, weeping drainage from the right leg.  She has limited mobility and cannot bear weight on her right leg, lives alone.  She has history of bilateral knee replacement.  Started on IV vancomycin, wound care consulted  Assessment & Plan:   Principal Problem:   Cellulitis of right lower extremity Active Problems:   Essential hypertension   Morbid obesity (HCC)   Hypokalemia   AKI (acute kidney injury) ([place])   Cellulitis of right lower extremity: Presented with pain, swelling, erythematous changes, weeping drainage from the right leg.  No ulcers identified.  Started on vancomycin for coverage of nonpurulent cellulitis.  Follow-up blood cultures, no growth till date.  Continue pain management, supportive care. Venous Doppler negative for DVT Wound care nurse was consulted.  Legs were wrapped with Kerlix and Ace wraps Continue IV vancomycin for today.  Right leg still looks  erythematous but has improved somewhat in comparison to yesterday.  Bilateral lower extremity lymphedema: We strongly recommend to follow-up with lymphedema clinic as an outpatient  Hypertension: Continue home medications: Lotensin, metoprolol.  Monitor blood pressure  AKI: Mild elevation of creatinine from baseline.  Improved with rehydration.  Hypokalemia: Supplemented with potassium  Morbid obesity: BMI of 60.2  Debility/deconditioning: PT/OT consulted, recommended home health on discharge          DVT prophylaxis:Lovenox Code Status:  Full Family Communication: None at bedside Status is: Inpatient  Remains inpatient appropriate because:IV treatments appropriate due to intensity of illness or inability to take PO and Inpatient level of care appropriate due to severity of illness  Dispo: The patient is from: Home              Anticipated d/c is to: Home              Patient currently is not medically stable to d/c.   Difficult to place patient No     Consultants: None  Procedures:None  Antimicrobials:  Anti-infectives (From admission, onward)    Start     Dose/Rate Route Frequency Ordered Stop   08/22/20 1400  vancomycin (VANCOREADY) IVPB 1750 mg/350 mL        1,750 mg 175 mL/hr over 120 Minutes Intravenous Every 24 hours 08/22/20 1312     08/21/20 1200  vancomycin (VANCOCIN) 2,500 mg in sodium chloride 0.9 % 500 mL IVPB  Status:  Discontinued        2,500 mg 250 mL/hr over 120 Minutes Intravenous Every 36 hours 08/21/20 0121 08/22/20 1311   08/20/20 1615  vancomycin (VANCOCIN) IVPB 1000 mg/200 mL premix        1,000 mg 200 mL/hr over 60 Minutes Intravenous  Once 08/20/20 1609 08/20/20 1918       Subjective:  Patient seen and examined the bedside this morning.  Hemodynamically stable.  Lying in bed.  No new complaints today.  Leg examined at the bedside, erythematous definitely looks somewhat improved from yesterday.  Patient is afebrile  Objective: Vitals:   08/22/20 2114 08/23/20 0450 08/23/20 0836 08/23/20 0922  BP: (!) 141/59 (!) 157/71  (!) 138/59  Pulse:  87 84  84  Resp: 20 16  17   Temp: 98.3 F (36.8 C) 98.2 F (36.8 C)  98.2 F (36.8 C)  TempSrc: Oral Oral  Oral  SpO2: 92% 94% 95% 94%  Weight:      Height:        Intake/Output Summary (Last 24 hours) at 08/23/2020 1315 Last data filed at 08/23/2020 1145 Gross per 24 hour  Intake 1310 ml  Output 2700 ml  Net -1390 ml   Filed Weights   08/20/20 1544 08/20/20 1736 08/20/20 2141  Weight: (!) 158.8 kg (!) 158.8 kg (!) 169.3 kg     Examination:  General exam: Overall comfortable, not in distress.  Morbidly obese HEENT: PERRL Respiratory system:  no wheezes or crackles  Cardiovascular system: S1 & S2 heard, RRR.  Gastrointestinal system: Abdomen is nondistended, soft and nontender. Central nervous system: Alert and oriented Extremities: No edema, no clubbing ,no cyanosis Skin: Erythematous discoloration of the right lower extremity, Bilateral legs wrapped with dressings    Data Reviewed: I have personally reviewed following labs and imaging studies  CBC: Recent Labs  Lab 08/20/20 1716 08/21/20 0245  WBC 8.6 8.0  NEUTROABS 5.8  --   HGB 11.9* 11.6*  HCT 37.3 38.3  MCV 101.6* 105.5*  PLT 236 644   Basic Metabolic Panel: Recent Labs  Lab 08/20/20 1716 08/20/20 2253 08/21/20 0245 08/22/20 0303 08/23/20 0245  NA 140  --  140 138  --   K 3.0*  --  3.4* 3.9  --   CL 98  --  100 99  --   CO2 31  --  30 32  --   GLUCOSE 120*  --  151* 124*  --   BUN 31*  --  28* 21  --   CREATININE 1.17*  --  1.06* 1.02* 1.17*  CALCIUM 9.2  --  8.7* 8.5*  --   MG  --  1.7  --   --   --    GFR: Estimated Creatinine Clearance: 70.9 mL/min (A) (by C-G formula based on SCr of 1.17 mg/dL (H)). Liver Function Tests: Recent Labs  Lab 08/20/20 1716  AST 9*  ALT 16  ALKPHOS 59  BILITOT 0.6  PROT 7.1  ALBUMIN 3.6   No results for input(s): LIPASE, AMYLASE in the last 168 hours. No results for input(s): AMMONIA in the last 168 hours. Coagulation Profile: Recent Labs  Lab 08/20/20 1716  INR 1.0   Cardiac Enzymes: No results for input(s): CKTOTAL, CKMB, CKMBINDEX, TROPONINI in the last 168 hours. BNP (last 3 results) No results for input(s): PROBNP in the last 8760 hours. HbA1C: No results for input(s): HGBA1C in the last 72 hours. CBG: No results for input(s): GLUCAP in the last 168 hours. Lipid Profile: No results for input(s): CHOL, HDL, LDLCALC, TRIG, CHOLHDL, LDLDIRECT in the last 72  hours. Thyroid Function Tests: No results for input(s): TSH, T4TOTAL, FREET4, T3FREE, THYROIDAB in the last 72 hours. Anemia Panel: No results for input(s): VITAMINB12, FOLATE, FERRITIN, TIBC, IRON, RETICCTPCT in the last 72 hours. Sepsis Labs: Recent Labs  Lab 08/20/20 1718  LATICACIDVEN 1.0    Recent Results (from the past 240 hour(s))  Blood culture (routine single)     Status: None (Preliminary result)   Collection Time: 08/20/20  5:15 PM   Specimen: BLOOD  Result Value Ref Range Status   Specimen Description   Final    BLOOD LEFT ANTECUBITAL Performed at [facility], [id number]  [address] [place], [place] [address]    Special Requests   Final    Blood Culture adequate volume BOTTLES DRAWN AEROBIC AND ANAEROBIC Performed at [facility], [address], [place], [place] [address]    Culture   Final    NO GROWTH 3 DAYS Performed at [facility], [place] [address] [place], [place] [address]    Report Status PENDING  Incomplete  Resp Panel by RT-PCR (Flu A&B, Covid) Nasopharyngeal Swab     Status: None   Collection Time: 08/20/20  5:18 PM   Specimen: Nasopharyngeal Swab; Nasopharyngeal(NP) swabs in vial transport medium  Result Value Ref Range Status   SARS Coronavirus 2 by RT PCR NEGATIVE NEGATIVE Final    Comment: (NOTE) SARS-CoV-2 target nucleic acids are NOT DETECTED.  The SARS-CoV-2 RNA is generally detectable in upper respiratory specimens during the acute phase of infection. The lowest concentration of SARS-CoV-2 viral copies this assay can detect is 138 copies/mL. A negative result does not preclude SARS-Cov-2 infection and should not be used as the sole basis for treatment or other patient management decisions. A negative result may occur with  improper specimen collection/handling, submission of specimen other than nasopharyngeal swab, presence of viral mutation(s) within the areas targeted by this assay, and inadequate  number of viral copies(<138 copies/mL). A negative result must be combined with clinical observations, patient history, and epidemiological information. The expected result is Negative.  Fact Sheet for Patients:  EntrepreneurPulse.com.au  Fact Sheet for Healthcare Providers:  IncredibleEmployment.be  This test is no t yet approved or cleared by the [place] FDA and  has been authorized for detection and/or diagnosis of SARS-CoV-2 by FDA under an Emergency Use Authorization (EUA). This EUA will remain  in effect (meaning this test can be used) for the duration of the COVID-19 declaration under Section 564(b)(1) of the Act, 21 U.S.C.section 360bbb-3(b)(1), unless the authorization is terminated  or revoked sooner.       Influenza A by PCR NEGATIVE NEGATIVE Final   Influenza B by PCR NEGATIVE NEGATIVE Final    Comment: (NOTE) The Xpert Xpress SARS-CoV-2/FLU/RSV plus assay is intended as an aid in the diagnosis of influenza from Nasopharyngeal swab specimens and should not be used as a sole basis for treatment. Nasal washings and aspirates are unacceptable for Xpert Xpress SARS-CoV-2/FLU/RSV testing.  Fact Sheet for Patients: EntrepreneurPulse.com.au  Fact Sheet for Healthcare Providers: IncredibleEmployment.be  This test is not yet approved or cleared by the [place] FDA and has been authorized for detection and/or diagnosis of SARS-CoV-2 by FDA under an Emergency Use Authorization (EUA). This EUA will remain in effect (meaning this test can be used) for the duration of the COVID-19 declaration under Section 564(b)(1) of the Act, 21 U.S.C. section 360bbb-3(b)(1), unless the authorization is terminated or revoked.  Performed at [facility], [address] [place], [place] [address]   Urine culture     Status: Abnormal   Collection Time: 08/20/20  7:58 PM   Specimen:  In/Out Cath Urine  Result Value Ref Range Status   Specimen Description   Final    IN/OUT CATH URINE Performed at [facility], [address] [place], [place] [address]    Special Requests   Final    NONE Performed at [facility], [address], [place],  [address]    Culture MULTIPLE SPECIES PRESENT, SUGGEST RECOLLECTION (A)  Final   Report Status 08/22/2020 FINAL  Final  Radiology Studies: No results found.      Scheduled Meds:  benazepril  20 mg Oral Daily   cholecalciferol  1,000 Units Oral Daily   enoxaparin (LOVENOX) injection  80 mg Subcutaneous Q24H   hydrochlorothiazide  25 mg Oral Daily   metoprolol tartrate  50 mg Oral BID   Continuous Infusions:  vancomycin 1,750 mg (08/22/20 1441)     LOS: 3 days    Time spent: 25 mins.More than 50% of that time was spent in counseling and/or coordination of care.      Shelly Coss, MD [facility] P7/04/2020, 1:15 PM

## 2020-08-24 LAB — CBC WITH DIFFERENTIAL/PLATELET
Abs Immature Granulocytes: 0.12 K/uL — ABNORMAL HIGH (ref 0.00–0.07)
Basophils Absolute: 0 K/uL (ref 0.0–0.1)
Basophils Relative: 0 %
Eosinophils Absolute: 0.3 K/uL (ref 0.0–0.5)
Eosinophils Relative: 3 %
HCT: 35.3 % — ABNORMAL LOW (ref 36.0–46.0)
Hemoglobin: 11.1 g/dL — ABNORMAL LOW (ref 12.0–15.0)
Immature Granulocytes: 1 %
Lymphocytes Relative: 23 %
Lymphs Abs: 2.2 K/uL (ref 0.7–4.0)
MCH: 32.5 pg (ref 26.0–34.0)
MCHC: 31.4 g/dL (ref 30.0–36.0)
MCV: 103.2 fL — ABNORMAL HIGH (ref 80.0–100.0)
Monocytes Absolute: 0.6 K/uL (ref 0.1–1.0)
Monocytes Relative: 6 %
Neutro Abs: 6.2 K/uL (ref 1.7–7.7)
Neutrophils Relative %: 67 %
Platelets: 271 K/uL (ref 150–400)
RBC: 3.42 MIL/uL — ABNORMAL LOW (ref 3.87–5.11)
RDW: 14.5 % (ref 11.5–15.5)
WBC: 9.4 K/uL (ref 4.0–10.5)
nRBC: 0 % (ref 0.0–0.2)

## 2020-08-24 LAB — BASIC METABOLIC PANEL WITH GFR
Anion gap: 10 (ref 5–15)
BUN: 14 mg/dL (ref 8–23)
CO2: 31 mmol/L (ref 22–32)
Calcium: 8.8 mg/dL — ABNORMAL LOW (ref 8.9–10.3)
Chloride: 98 mmol/L (ref 98–111)
Creatinine, Ser: 0.93 mg/dL (ref 0.44–1.00)
GFR, Estimated: >60 mL/min (ref >60)
Glucose, Bld: 129 mg/dL — ABNORMAL HIGH (ref 70–99)
Potassium: 3.9 mmol/L (ref 3.5–5.1)
Sodium: 139 mmol/L (ref 135–145)

## 2020-08-24 NOTE — Progress Notes (Signed)
PT Cancellation Note  Patient Details Name: Allison Short MRN: [id number] DOB: 12-19-1950   Cancelled Treatment:    Reason Eval/Treat Not Completed: Patient declined, no reason specified Pt reports she just sat up in recliner to eat lunch and ambulated a little with nursing prior to returning to bed.  Pt back in bed and declines mobility again at this time.   Zakee Deerman,KATHrine E 08/24/2020, 2:08 PM Kati PT, DPT Acute Rehabilitation Services Pager: [phone] Office: [phone]

## 2020-08-24 NOTE — Progress Notes (Signed)
PROGRESS NOTE    Allison Short  ZSW:[id number] DOB: 1950/03/17 DOA: 08/20/2020 PCP: Dorothyann Peng, NP   Chief Complain: Pain and redness of right leg  Brief Narrative: Patient is a 70 year old female with history of hypertension, lymphedema of bilateral lower extremities, history of non-Hodgkin's lymphoma who was sent from [facility] for direct admission for the management of right lower extremity cellulitis.  Patient reported swelling, erythematous changes, weeping drainage from the right leg.  She has limited mobility and cannot bear weight on her right leg, lives alone.  She has history of bilateral knee replacement.  Started on IV vancomycin, wound care consulted.  Assessment & Plan:   Principal Problem:   Cellulitis of right lower extremity Active Problems:   Essential hypertension   Morbid obesity (HCC)   Hypokalemia   AKI (acute kidney injury) ([place])   Cellulitis of right lower extremity: Presented with pain, swelling, erythematous changes, weeping drainage from the right leg.  No ulcers identified.  Started on vancomycin for coverage of nonpurulent cellulitis.  Follow-up blood cultures, no growth till date.  Continue pain management, supportive care. Venous Doppler negative for DVT Wound care nurse was consulted.  Legs were wrapped with Kerlix and Ace wraps Continue IV vancomycin for today.  Right leg still looks  erythematous but has improved.  There is less drainage from the wound.  The erythematous changes can take some time to go away.  I have requested for evaluation by wound care.  I will keep the legs open today.  Bilateral lower extremity lymphedema: We strongly recommend to follow-up with lymphedema clinic as an outpatient  Hypertension: Continue home medications: Lotensin, metoprolol.  Monitor blood pressure  AKI: Mild elevation of creatinine from baseline.  Improved with rehydration.  Hypokalemia: Supplemented with potassium  Morbid obesity: BMI of  60.2  Debility/deconditioning: PT/OT consulted, recommended home health on discharge          DVT prophylaxis:Lovenox Code Status: Full Family Communication: None at bedside Status is: Inpatient  Remains inpatient appropriate because:IV treatments appropriate due to intensity of illness or inability to take PO and Inpatient level of care appropriate due to severity of illness  Dispo: The patient is from: Home              Anticipated d/c is to: Home              Patient currently is not medically stable to d/c.   Difficult to place patient No     Consultants: None  Procedures:None  Antimicrobials:  Anti-infectives (From admission, onward)    Start     Dose/Rate Route Frequency Ordered Stop   08/22/20 1400  vancomycin (VANCOREADY) IVPB 1750 mg/350 mL        1,750 mg 175 mL/hr over 120 Minutes Intravenous Every 24 hours 08/22/20 1312     08/21/20 1200  vancomycin (VANCOCIN) 2,500 mg in sodium chloride 0.9 % 500 mL IVPB  Status:  Discontinued        2,500 mg 250 mL/hr over 120 Minutes Intravenous Every 36 hours 08/21/20 0121 08/22/20 1311   08/20/20 1615  vancomycin (VANCOCIN) IVPB 1000 mg/200 mL premix        1,000 mg 200 mL/hr over 60 Minutes Intravenous  Once 08/20/20 1609 08/20/20 1918       Subjective:  Patient seen and examined the bedside this morning.  She denies any new complaints today.  The right leg is still tender to touch but has significantly less drainage and  appearing drier.  Objective: Vitals:   08/23/20 0922 08/23/20 1419 08/23/20 2106 08/24/20 0456  BP: (!) 138/59 (!) 165/66 (!) 151/66 (!) 150/82  Pulse: 84 84 85 68  Resp: 17 20 16 17   Temp: 98.2 F (36.8 C) 98.1 F (36.7 C) 97.8 F (36.6 C) 98 F (36.7 C)  TempSrc: Oral Oral Oral Oral  SpO2: 94% 91% 92% 92%  Weight:      Height:        Intake/Output Summary (Last 24 hours) at 08/24/2020 1144 Last data filed at 08/24/2020 0843 Gross per 24 hour  Intake 1609.97 ml  Output 4100 ml   Net -2490.03 ml   Filed Weights   08/20/20 1544 08/20/20 1736 08/20/20 2141  Weight: (!) 158.8 kg (!) 158.8 kg (!) 169.3 kg    Examination:  General exam: Overall comfortable, not in distress, morbidly obese HEENT: PERRL Respiratory system:  no wheezes or crackles  Cardiovascular system: S1 & S2 heard, RRR.  Gastrointestinal system: Abdomen is nondistended, soft and nontender. Central nervous system: Alert and oriented Extremities: Lymphedema bilateral lower extremities  skin: as below     Data Reviewed: I have personally reviewed following labs and imaging studies  CBC: Recent Labs  Lab 08/20/20 1716 08/21/20 0245 08/24/20 0318  WBC 8.6 8.0 9.4  NEUTROABS 5.8  --  6.2  HGB 11.9* 11.6* 11.1*  HCT 37.3 38.3 35.3*  MCV 101.6* 105.5* 103.2*  PLT 236 210 720   Basic Metabolic Panel: Recent Labs  Lab 08/20/20 1716 08/20/20 2253 08/21/20 0245 08/22/20 0303 08/23/20 0245 08/24/20 0318  NA 140  --  140 138  --  139  K 3.0*  --  3.4* 3.9  --  3.9  CL 98  --  100 99  --  98  CO2 31  --  30 32  --  31  GLUCOSE 120*  --  151* 124*  --  129*  BUN 31*  --  28* 21  --  14  CREATININE 1.17*  --  1.06* 1.02* 1.17* 0.93  CALCIUM 9.2  --  8.7* 8.5*  --  8.8*  MG  --  1.7  --   --   --   --    GFR: Estimated Creatinine Clearance: 89.2 mL/min (by C-G formula based on SCr of 0.93 mg/dL). Liver Function Tests: Recent Labs  Lab 08/20/20 1716  AST 9*  ALT 16  ALKPHOS 59  BILITOT 0.6  PROT 7.1  ALBUMIN 3.6   No results for input(s): LIPASE, AMYLASE in the last 168 hours. No results for input(s): AMMONIA in the last 168 hours. Coagulation Profile: Recent Labs  Lab 08/20/20 1716  INR 1.0   Cardiac Enzymes: No results for input(s): CKTOTAL, CKMB, CKMBINDEX, TROPONINI in the last 168 hours. BNP (last 3 results) No results for input(s): PROBNP in the last 8760 hours. HbA1C: No results for input(s): HGBA1C in the last 72 hours. CBG: No results for input(s): GLUCAP  in the last 168 hours. Lipid Profile: No results for input(s): CHOL, HDL, LDLCALC, TRIG, CHOLHDL, LDLDIRECT in the last 72 hours. Thyroid Function Tests: No results for input(s): TSH, T4TOTAL, FREET4, T3FREE, THYROIDAB in the last 72 hours. Anemia Panel: No results for input(s): VITAMINB12, FOLATE, FERRITIN, TIBC, IRON, RETICCTPCT in the last 72 hours. Sepsis Labs: Recent Labs  Lab 08/20/20 1718  LATICACIDVEN 1.0    Recent Results (from the past 240 hour(s))  Blood culture (routine single)     Status: None (Preliminary result)  Collection Time: 08/20/20  5:15 PM   Specimen: BLOOD  Result Value Ref Range Status   Specimen Description   Final    BLOOD LEFT ANTECUBITAL Performed at [facility], [address] [place], [place] [address]    Special Requests   Final    Blood Culture adequate volume BOTTLES DRAWN AEROBIC AND ANAEROBIC Performed at [facility], [address], [place], [place] [address]    Culture   Final    NO GROWTH 4 DAYS Performed at [facility], [place] [address], [place], [place] [address]    Report Status PENDING  Incomplete  Resp Panel by RT-PCR (Flu A&B, Covid) Nasopharyngeal Swab     Status: None   Collection Time: 08/20/20  5:18 PM   Specimen: Nasopharyngeal Swab; Nasopharyngeal(NP) swabs in vial transport medium  Result Value Ref Range Status   SARS Coronavirus 2 by RT PCR NEGATIVE NEGATIVE Final    Comment: (NOTE) SARS-CoV-2 target nucleic acids are NOT DETECTED.  The SARS-CoV-2 RNA is generally detectable in upper respiratory specimens during the acute phase of infection. The lowest concentration of SARS-CoV-2 viral copies this assay can detect is 138 copies/mL. A negative result does not preclude SARS-Cov-2 infection and should not be used as the sole basis for treatment or other patient management decisions. A negative result may occur with  improper specimen collection/handling, submission of  specimen other than nasopharyngeal swab, presence of viral mutation(s) within the areas targeted by this assay, and inadequate number of viral copies(<138 copies/mL). A negative result must be combined with clinical observations, patient history, and epidemiological information. The expected result is Negative.  Fact Sheet for Patients:  EntrepreneurPulse.com.au  Fact Sheet for Healthcare Providers:  IncredibleEmployment.be  This test is no t yet approved or cleared by the [place] FDA and  has been authorized for detection and/or diagnosis of SARS-CoV-2 by FDA under an Emergency Use Authorization (EUA). This EUA will remain  in effect (meaning this test can be used) for the duration of the COVID-19 declaration under Section 564(b)(1) of the Act, 21 U.S.C.section 360bbb-3(b)(1), unless the authorization is terminated  or revoked sooner.       Influenza A by PCR NEGATIVE NEGATIVE Final   Influenza B by PCR NEGATIVE NEGATIVE Final    Comment: (NOTE) The Xpert Xpress SARS-CoV-2/FLU/RSV plus assay is intended as an aid in the diagnosis of influenza from Nasopharyngeal swab specimens and should not be used as a sole basis for treatment. Nasal washings and aspirates are unacceptable for Xpert Xpress SARS-CoV-2/FLU/RSV testing.  Fact Sheet for Patients: EntrepreneurPulse.com.au  Fact Sheet for Healthcare Providers: IncredibleEmployment.be  This test is not yet approved or cleared by the [place] FDA and has been authorized for detection and/or diagnosis of SARS-CoV-2 by FDA under an Emergency Use Authorization (EUA). This EUA will remain in effect (meaning this test can be used) for the duration of the COVID-19 declaration under Section 564(b)(1) of the Act, 21 U.S.C. section 360bbb-3(b)(1), unless the authorization is terminated or revoked.  Performed at [facility], [address], [place], [place] [address]   Urine culture     Status: Abnormal   Collection Time: 08/20/20  7:58 PM   Specimen: In/Out Cath Urine  Result Value Ref Range Status   Specimen Description   Final    IN/OUT CATH URINE Performed at [facility], [address] [place], [place] [address]    Special Requests   Final    NONE  Performed at [facility], [address] [place], [place] [address]    Culture MULTIPLE SPECIES PRESENT, SUGGEST RECOLLECTION (A)  Final   Report Status 08/22/2020 FINAL  Final         Radiology Studies: No results found.      Scheduled Meds:  benazepril  20 mg Oral Daily   cholecalciferol  1,000 Units Oral Daily   enoxaparin (LOVENOX) injection  80 mg Subcutaneous Q24H   hydrochlorothiazide  25 mg Oral Daily   metoprolol tartrate  50 mg Oral BID   Continuous Infusions:  vancomycin 175 mL/hr at 08/23/20 1636     LOS: 4 days    Time spent: 25 mins.More than 50% of that time was spent in counseling and/or coordination of care.      Shelly Coss, MD [facility] P7/05/2020, 11:44 AM

## 2020-08-24 NOTE — Care Management Important Message (Signed)
Important Message  Patient Details IM Letter given to the Patient. Name: Allison Short MRN: [id number] Date of Birth: [birth year]   Medicare Important Message Given:  Yes     Kerin Salen 08/24/2020, 11:51 AM

## 2020-08-25 LAB — CULTURE, BLOOD (SINGLE)
Culture: NO GROWTH
Special Requests: ADEQUATE

## 2020-08-25 MED ORDER — BENAZEPRIL-HYDROCHLOROTHIAZIDE 20-25 MG PO TABS: 1.0000 | ORAL_TABLET | Freq: Every day | ORAL | Status: DC

## 2020-08-25 MED ORDER — BENAZEPRIL HCL 20 MG PO TABS
20.0000 mg | ORAL_TABLET | Freq: Every day | ORAL | Status: DC
  Administered 2020-08-26: 20 mg via ORAL
  Filled 2020-08-25: qty 1

## 2020-08-25 MED ORDER — HYDROCHLOROTHIAZIDE 25 MG PO TABS
25.0000 mg | ORAL_TABLET | Freq: Every day | ORAL | Status: DC
  Administered 2020-08-26: 25 mg via ORAL
  Filled 2020-08-25: qty 1

## 2020-08-25 MED ORDER — DIPHENHYDRAMINE HCL 25 MG PO CAPS
25.0000 mg | ORAL_CAPSULE | Freq: Four times a day (QID) | ORAL | Status: DC | PRN
  Administered 2020-08-25 – 2020-08-26 (×2): 25 mg via ORAL
  Filled 2020-08-25 (×2): qty 1

## 2020-08-25 MED ORDER — ONDANSETRON HCL 4 MG PO TABS
4.0000 mg | ORAL_TABLET | Freq: Three times a day (TID) | ORAL | Status: DC | PRN
  Administered 2020-08-25: 4 mg via ORAL
  Filled 2020-08-25: qty 1

## 2020-08-25 NOTE — Progress Notes (Signed)
Physical Therapy Treatment Patient Details Name: Allison Short MRN: [id number] DOB: 27-Jan-1951 Today's Date: 08/25/2020    History of Present Illness Patient is a 70 year old female with history of hypertension, lymphedema of bilateral lower extremities, history of non-Hodgkin's lymphoma who was sent from [facility] for direct admission for the management of right lower extremity cellulitis.  Patient reported swelling, erythematous changes, weeping drainage from the right leg.  She has limited mobility and cannot bear weight on her right leg, lives alone.  She has history of bilateral knee replacement.  Started on IV vancomycin, wound care consulted.  Plan for discharge tomorrow with oral antibiotics.    PT Comments    Pt in bed AxO x 3 very pleasant.  Assisted OOB to amb to bathroom.  General bed mobility comments: (P) for R LE management to edge of bed with increased time.  Heavy use of bed rail.  Pt admits to sleeping in a recliner chair. General transfer comment: needing increased time but no physical assistance, demonstrates safe hand placement.  Uses forward momentum to rise. General Gait Details: Increased time with steady assist and multiple standing rest breaks.  Limited distance to and from bathroom due to dyspnea. Pt plans to return home   Follow Up Recommendations  [place]     Equipment Recommendations  None recommended by PT (has equipment)    Recommendations for Other Services       Precautions / Restrictions Precautions Precautions: Fall    Mobility  Bed Mobility Overal bed mobility: (P) Needs Assistance Bed Mobility: (P) Supine to Sit     Supine to sit: (P) Min assist;HOB elevated     General bed mobility comments: (P) for R LE management to edge of bed with increased time.  Heavy use of bed rail.  Pt admits to sleeping in a recliner chair.    Transfers Overall transfer level: Needs assistance Equipment used: Rolling walker (2  wheeled) Transfers: Sit to/from Stand Sit to Stand: Supervision;From elevated surface         General transfer comment: needing increased time but no physical assistance, demonstrates safe hand placement.  Uses forward momentum to rise.  Ambulation/Gait Ambulation/Gait assistance: Min guard Gait Distance (Feet): 24 Feet (12 feet x 2 to and from bathroom only due to fatigue) Assistive device: Rolling walker (2 wheeled) Gait Pattern/deviations: Step-to pattern;Decreased step length - right;Decreased step length - left;Shuffle;Trunk flexed Gait velocity: decr   General Gait Details: Increased time with steady assist and multiple standing rest breaks.  Limited distance to and from bathroom due to dyspnea.   Stairs             Wheelchair Mobility    Modified Rankin (Stroke Patients Only)       Balance Overall balance assessment: Needs assistance Sitting-balance support: Feet supported Sitting balance-Leahy Scale: Good     Standing balance support: No upper extremity supported Standing balance-Leahy Scale: Fair Standing balance comment: static stand for peri care without UE support                            Cognition Arousal/Alertness: Awake/alert Behavior During Therapy: WFL for tasks assessed/performed Overall Cognitive Status: Within Functional Limits for tasks assessed                                 General Comments: AxO x 3  very pleasant      Exercises      General Comments        Pertinent Vitals/Pain Pain Assessment: 0-10 Pain Score: 6  Faces Pain Scale: Hurts even more Pain Location: R LE Pain Descriptors / Indicators: Aching;Grimacing;Guarding Pain Intervention(s): Monitored during session;Repositioned    Home Living                      Prior Function            PT Goals (current goals can now be found in the care plan section) Acute Rehab PT Goals Patient Stated Goal: return home at  discharge Progress towards PT goals: Progressing toward goals    Frequency    Min 3X/week      PT Plan Current plan remains appropriate    Co-evaluation              AM-PAC PT "6 Clicks" Mobility   Outcome Measure  Help needed turning from your back to your side while in a flat bed without using bedrails?: A Little Help needed moving from lying on your back to sitting on the side of a flat bed without using bedrails?: A Little Help needed moving to and from a bed to a chair (including a wheelchair)?: A Little Help needed standing up from a chair using your arms (e.g., wheelchair or bedside chair)?: A Little Help needed to walk in hospital room?: A Little Help needed climbing 3-5 steps with a railing? : A Lot 6 Click Score: 17    End of Session Equipment Utilized During Treatment: Gait belt Activity Tolerance: Patient limited by fatigue;Patient limited by pain;Other (comment) (dyspnea) Patient left: in chair;with call bell/phone within reach Nurse Communication: Mobility status PT Visit Diagnosis: Unsteadiness on feet (R26.81);Muscle weakness (generalized) (M62.81);Difficulty in walking, not elsewhere classified (R26.2);Pain Pain - Right/Left: Right Pain - part of body: Leg     Time: 1210-1224 PT Time Calculation (min) (ACUTE ONLY): 14 min  Charges:  $Gait Training: 8-22 mins                     {Terriann Difonzo  PTA Acute  Rehabilitation [employer]      [phone] Office      [phone]

## 2020-08-25 NOTE — Progress Notes (Signed)
Occupational Therapy Treatment Patient Details Name: Allison Short MRN: [id number] DOB: 1950-09-16 Today's Date: 08/25/2020    History of present illness Pt admitted with R LE cellulitus, hypokalemia and AKI and with hx of bil TKR (L one doing well but R has been difficult and continues with weakness and ROM limitations), and non-hodgkins lymphoma   OT comments  Patient needing min A for out of bed due to pain and weakness limiting ability to lift/slide leg independently. Patient states typically sleeps in recliner at home. Overall supervision level for sit to stand transfer from edge of bed, commode over toilet and ambulation with walker, demonstrates safe body mechanics. Patient reports pain in leg with ambulation but improving from admission. Continues to require increased assist with lower body dressing task, total A to don socks. Continue acute OT services to facilitate D/C home with [facility].    Follow Up Recommendations  Home health OT    Equipment Recommendations  None recommended by OT       Precautions / Restrictions Precautions Precautions: Fall       Mobility Bed Mobility Overal bed mobility: Needs Assistance Bed Mobility: Supine to Sit     Supine to sit: Min assist;HOB elevated     General bed mobility comments: for R LE management to edge of bed    Transfers Overall transfer level: Needs assistance Equipment used: Rolling walker (2 wheeled) Transfers: Sit to/from Stand Sit to Stand: Supervision;From elevated surface         General transfer comment: needing increased time but no physical assistance, demonstrates safe hand placement    Balance Overall balance assessment: Needs assistance Sitting-balance support: Feet supported Sitting balance-Leahy Scale: Good     Standing balance support: No upper extremity supported Standing balance-Leahy Scale: Fair Standing balance comment: static stand for peri care without UE support                            ADL either performed or assessed with clinical judgement   ADL Overall ADL's : Needs assistance/impaired Eating/Feeding: Independent                   Lower Body Dressing: Total assistance;Bed level Lower Body Dressing Details (indicate cue type and reason): to don socks Toilet Transfer: Supervision/safety;Ambulation;RW (commode over toilet) Toilet Transfer Details (indicate cue type and reason): patient needing no physical assistance to power up to standing from elevated bed or from toilet. safe hand placement pushing from surfaces Toileting- Clothing Manipulation and Hygiene: Supervision/safety;Sit to/from stand Toileting - Clothing Manipulation Details (indicate cue type and reason): for peri care after voiding     Functional mobility during ADLs: Supervision/safety;Rolling walker General ADL Comments: patient reports pain is improving, has been getting up with nursing to bathroom throughout the day. overall supervision level for transfers and ambulation, primarily limited by LE pain needing increased assist with LB ADLs      Cognition Arousal/Alertness: Awake/alert Behavior During Therapy: WFL for tasks assessed/performed Overall Cognitive Status: Within Functional Limits for tasks assessed                                                     Pertinent Vitals/ Pain       Pain Assessment: Faces Faces Pain Scale: Hurts even more Pain Location: R thigh/back of  leg Pain Descriptors / Indicators: Aching;Grimacing;Guarding Pain Intervention(s): Monitored during session;Patient requesting pain meds-RN notified         Frequency  Min 2X/week        Progress Toward Goals  OT Goals(current goals can now be found in the care plan section)  Progress towards OT goals: Progressing toward goals  Acute Rehab OT Goals Patient Stated Goal: return home at discharge OT Goal Formulation: With patient Time For Goal Achievement: 09/05/20 Potential to  Achieve Goals: Fair ADL Goals Pt Will Transfer to Toilet: with supervision;regular height toilet;grab bars Pt Will Perform Toileting - Clothing Manipulation and hygiene: with min guard assist;sit to/from stand Additional ADL Goal #1: Patient don underwear or pseudo underwear with AE prior to discharge. Additional ADL Goal #2: Patient will stand at sink to perform grooming task as evidence of improving activity tolerance  Plan Discharge plan remains appropriate       AM-PAC OT "6 Clicks" Daily Activity     Outcome Measure   Help from another person eating meals?: None Help from another person taking care of personal grooming?: A Little Help from another person toileting, which includes using toliet, bedpan, or urinal?: A Little Help from another person bathing (including washing, rinsing, drying)?: A Lot Help from another person to put on and taking off regular upper body clothing?: A Little Help from another person to put on and taking off regular lower body clothing?: Total 6 Click Score: 16    End of Session Equipment Utilized During Treatment: Rolling walker  OT Visit Diagnosis: Pain;Other abnormalities of gait and mobility (R26.89) Pain - Right/Left: Right Pain - part of body: Leg   Activity Tolerance Patient tolerated treatment well   Patient Left in chair;with call bell/phone within reach;with nursing/sitter in room   Nurse Communication Patient requests pain meds        Time: 2751-7001 OT Time Calculation (min): 23 min  Charges: OT General Charges $OT Visit: 1 Visit OT Treatments $Self Care/Home Management : 23-37 mins  Delbert Phenix OT OT pager: [place] 08/25/2020, 11:22 AM

## 2020-08-25 NOTE — Consult Note (Signed)
WOC Nurse Consult Note: Patient receiving care in [place] [facility]. Reason for Consult: BLE care related to lymphedema; patient to be discharged today. Wound type: RLE cellulitis and fluid filled bulla present upon admission.  Now much improved. Pressure Injury POA: Yes/No/NA Measurement: Wound bed: Drainage (amount, consistency, odor)  Periwound: Dressing procedure/placement/frequency:  If this patient has never been connected with a lymphedema specialist, this would be the number one priority for her.   As for topical therapy, please continue what I have previously outlined with Xeroform gauze, kerlix and ace wraps. [facility] nurse will not follow at this time.  Please re-consult the [facility] team if needed.  Val Riles, RN, MSN, CWOCN, CNS-BC, pager [phone]

## 2020-08-25 NOTE — Progress Notes (Signed)
PROGRESS NOTE    Allison Short  HOZ:[id number] DOB: 1950-07-19 DOA: 08/20/2020 PCP: Dorothyann Peng, NP   Chief Complain: Pain and redness of right leg  Brief Narrative: Patient is a 70 year old female with history of hypertension, lymphedema of bilateral lower extremities, history of non-Hodgkin's lymphoma who was sent from [facility] for direct admission for the management of right lower extremity cellulitis.  Patient reported swelling, erythematous changes, weeping drainage from the right leg.  She has limited mobility and cannot bear weight on her right leg, lives alone.  She has history of bilateral knee replacement.  Started on IV vancomycin, wound care consulted.  Plan for discharge tomorrow with oral antibiotics.  Assessment & Plan:   Principal Problem:   Cellulitis of right lower extremity Active Problems:   Essential hypertension   Morbid obesity (HCC)   Hypokalemia   AKI (acute kidney injury) ([place])   Cellulitis of right lower extremity: Presented with pain, swelling, erythematous changes, weeping drainage from the right leg.  No ulcers identified.  Started on vancomycin for coverage of nonpurulent cellulitis.  Follow-up blood cultures, no growth till date.  Continue pain management, supportive care. Venous Doppler negative for DVT Wound care nurse was consulted.  Recommended the legs to be wrapped  with Kerlix and Ace wraps. Patient states she cannot be discharged today because of limited preparation/arrangement at home. Continue IV vancomycin for today.  Right leg is still erythematous but I think there is significant improvement in her cellulitis.  The erythematous changes are from chronic venous stasis changes not necessary from active infection. there is less drainage from the wound.  The erythematous changes can take some time to go away.  On discharge ,we can consider putting her  on doxycycline/Bactrim for total of 2 weeks course  Bilateral lower extremity  lymphedema: We strongly recommend to follow-up with lymphedema clinic as an outpatient.  I have requested social worker for this  Hypertension: Continue home medications: Lotensin, metoprolol.  Monitor blood pressure  AKI: Mild elevation of creatinine from baseline.  Improved with rehydration.  Morbid obesity: BMI of 60.2  Debility/deconditioning: PT/OT consulted, recommended home health on discharge.  RN added for home health          DVT prophylaxis:Lovenox Code Status: Full Family Communication: None at bedside Status is: Inpatient  Remains inpatient appropriate because:IV treatments appropriate due to intensity of illness or inability to take PO and Inpatient level of care appropriate due to severity of illness  Dispo: The patient is from: Home              Anticipated d/c is to: Home              Patient currently is not medically stable to d/c.ready tomorrow   Difficult to place patient No     Consultants: None  Procedures:None  Antimicrobials:  Anti-infectives (From admission, onward)    Start     Dose/Rate Route Frequency Ordered Stop   08/22/20 1400  vancomycin (VANCOREADY) IVPB 1750 mg/350 mL        1,750 mg 175 mL/hr over 120 Minutes Intravenous Every 24 hours 08/22/20 1312     08/21/20 1200  vancomycin (VANCOCIN) 2,500 mg in sodium chloride 0.9 % 500 mL IVPB  Status:  Discontinued        2,500 mg 250 mL/hr over 120 Minutes Intravenous Every 36 hours 08/21/20 0121 08/22/20 1311   08/20/20 1615  vancomycin (VANCOCIN) IVPB 1000 mg/200 mL premix  1,000 mg 200 mL/hr over 60 Minutes Intravenous  Once 08/20/20 1609 08/20/20 1918       Subjective:  Patient seen and examined at the bedside this morning.  Hemodynamically stable.  Comfortable.  Afebrile.  We discussed about discharge planning , she says she has limited arrangement , preparation at home and wants to stay 1 more night.  Objective: Vitals:   08/24/20 1349 08/24/20 1428 08/24/20 2144  08/25/20 0607  BP: (!) 147/68  (!) 159/77 (!) 153/81  Pulse: 74  83 73  Resp:   16 16  Temp: 98.5 F (36.9 C)  98.1 F (36.7 C) (!) 97.5 F (36.4 C)  TempSrc: Oral  Oral Oral  SpO2: 95% 92% 94% 96%  Weight:      Height:        Intake/Output Summary (Last 24 hours) at 08/25/2020 1126 Last data filed at 08/25/2020 4742 Gross per 24 hour  Intake 1205.9 ml  Output 1950 ml  Net -744.1 ml   Filed Weights   08/20/20 1544 08/20/20 1736 08/20/20 2141  Weight: (!) 158.8 kg (!) 158.8 kg (!) 169.3 kg    Examination:  General exam: Overall comfortable, not in distress,obese HEENT: PERRL Respiratory system:  no wheezes or crackles  Cardiovascular system: S1 & S2 heard, RRR.  Gastrointestinal system: Abdomen is nondistended, soft and nontender. Central nervous system: Alert and oriented  Extremities: Lymphedema bilateral lower extremities  skin: as below     Data Reviewed: I have personally reviewed following labs and imaging studies  CBC: Recent Labs  Lab 08/20/20 1716 08/21/20 0245 08/24/20 0318  WBC 8.6 8.0 9.4  NEUTROABS 5.8  --  6.2  HGB 11.9* 11.6* 11.1*  HCT 37.3 38.3 35.3*  MCV 101.6* 105.5* 103.2*  PLT 236 210 595   Basic Metabolic Panel: Recent Labs  Lab 08/20/20 1716 08/20/20 2253 08/21/20 0245 08/22/20 0303 08/23/20 0245 08/24/20 0318  NA 140  --  140 138  --  139  K 3.0*  --  3.4* 3.9  --  3.9  CL 98  --  100 99  --  98  CO2 31  --  30 32  --  31  GLUCOSE 120*  --  151* 124*  --  129*  BUN 31*  --  28* 21  --  14  CREATININE 1.17*  --  1.06* 1.02* 1.17* 0.93  CALCIUM 9.2  --  8.7* 8.5*  --  8.8*  MG  --  1.7  --   --   --   --    GFR: Estimated Creatinine Clearance: 89.2 mL/min (by C-G formula based on SCr of 0.93 mg/dL). Liver Function Tests: Recent Labs  Lab 08/20/20 1716  AST 9*  ALT 16  ALKPHOS 59  BILITOT 0.6  PROT 7.1  ALBUMIN 3.6   No results for input(s): LIPASE, AMYLASE in the last 168 hours. No results for input(s): AMMONIA in  the last 168 hours. Coagulation Profile: Recent Labs  Lab 08/20/20 1716  INR 1.0   Cardiac Enzymes: No results for input(s): CKTOTAL, CKMB, CKMBINDEX, TROPONINI in the last 168 hours. BNP (last 3 results) No results for input(s): PROBNP in the last 8760 hours. HbA1C: No results for input(s): HGBA1C in the last 72 hours. CBG: No results for input(s): GLUCAP in the last 168 hours. Lipid Profile: No results for input(s): CHOL, HDL, LDLCALC, TRIG, CHOLHDL, LDLDIRECT in the last 72 hours. Thyroid Function Tests: No results for input(s): TSH, T4TOTAL, FREET4, T3FREE, THYROIDAB  in the last 72 hours. Anemia Panel: No results for input(s): VITAMINB12, FOLATE, FERRITIN, TIBC, IRON, RETICCTPCT in the last 72 hours. Sepsis Labs: Recent Labs  Lab 08/20/20 1718  LATICACIDVEN 1.0    Recent Results (from the past 240 hour(s))  Blood culture (routine single)     Status: None   Collection Time: 08/20/20  5:15 PM   Specimen: BLOOD  Result Value Ref Range Status   Specimen Description   Final    BLOOD LEFT ANTECUBITAL Performed at [facility], [address] [place], [place] [address]    Special Requests   Final    Blood Culture adequate volume BOTTLES DRAWN AEROBIC AND ANAEROBIC Performed at [facility], [address] [place], [place] [address]    Culture   Final    NO GROWTH 5 DAYS Performed at [facility], [place] [address], [place], [place] [address]    Report Status 08/25/2020 FINAL  Final  Resp Panel by RT-PCR (Flu A&B, Covid) Nasopharyngeal Swab     Status: None   Collection Time: 08/20/20  5:18 PM   Specimen: Nasopharyngeal Swab; Nasopharyngeal(NP) swabs in vial transport medium  Result Value Ref Range Status   SARS Coronavirus 2 by RT PCR NEGATIVE NEGATIVE Final    Comment: (NOTE) SARS-CoV-2 target nucleic acids are NOT DETECTED.  The SARS-CoV-2 RNA is generally detectable in upper respiratory specimens during the acute  phase of infection. The lowest concentration of SARS-CoV-2 viral copies this assay can detect is 138 copies/mL. A negative result does not preclude SARS-Cov-2 infection and should not be used as the sole basis for treatment or other patient management decisions. A negative result may occur with  improper specimen collection/handling, submission of specimen other than nasopharyngeal swab, presence of viral mutation(s) within the areas targeted by this assay, and inadequate number of viral copies(<138 copies/mL). A negative result must be combined with clinical observations, patient history, and epidemiological information. The expected result is Negative.  Fact Sheet for Patients:  EntrepreneurPulse.com.au  Fact Sheet for Healthcare Providers:  IncredibleEmployment.be  This test is no t yet approved or cleared by the [place] FDA and  has been authorized for detection and/or diagnosis of SARS-CoV-2 by FDA under an Emergency Use Authorization (EUA). This EUA will remain  in effect (meaning this test can be used) for the duration of the COVID-19 declaration under Section 564(b)(1) of the Act, 21 U.S.C.section 360bbb-3(b)(1), unless the authorization is terminated  or revoked sooner.       Influenza A by PCR NEGATIVE NEGATIVE Final   Influenza B by PCR NEGATIVE NEGATIVE Final    Comment: (NOTE) The Xpert Xpress SARS-CoV-2/FLU/RSV plus assay is intended as an aid in the diagnosis of influenza from Nasopharyngeal swab specimens and should not be used as a sole basis for treatment. Nasal washings and aspirates are unacceptable for Xpert Xpress SARS-CoV-2/FLU/RSV testing.  Fact Sheet for Patients: EntrepreneurPulse.com.au  Fact Sheet for Healthcare Providers: IncredibleEmployment.be  This test is not yet approved or cleared by the [place] FDA and has been authorized for detection and/or diagnosis of  SARS-CoV-2 by FDA under an Emergency Use Authorization (EUA). This EUA will remain in effect (meaning this test can be used) for the duration of the COVID-19 declaration under Section 564(b)(1) of the Act, 21 U.S.C. section 360bbb-3(b)(1), unless the authorization is terminated or revoked.  Performed at [facility], [address], [place], [place] [address]   Urine culture     Status: Abnormal  Collection Time: 08/20/20  7:58 PM   Specimen: In/Out Cath Urine  Result Value Ref Range Status   Specimen Description   Final    IN/OUT CATH URINE Performed at [facility], [address] [place], [place] [address]    Special Requests   Final    NONE Performed at [facility], [address], [place], [place] [address]    Culture MULTIPLE SPECIES PRESENT, SUGGEST RECOLLECTION (A)  Final   Report Status 08/22/2020 FINAL  Final         Radiology Studies: No results found.      Scheduled Meds:  benazepril  20 mg Oral Daily   cholecalciferol  1,000 Units Oral Daily   enoxaparin (LOVENOX) injection  80 mg Subcutaneous Q24H   hydrochlorothiazide  25 mg Oral Daily   metoprolol tartrate  50 mg Oral BID   Continuous Infusions:  vancomycin 1,750 mg (08/24/20 1410)     LOS: 5 days    Time spent: 25 mins.More than 50% of that time was spent in counseling and/or coordination of care.      Shelly Coss, MD [facility] P7/06/2020, 11:26 AM

## 2020-08-25 NOTE — Progress Notes (Addendum)
Pharmacy Antibiotic Note  Allison Short is a 70 y.o. female admitted on 08/20/2020 with  purulent cellulitis .  Pharmacy has been consulted for vancomycin dosing.  Today, 08/25/20 WBC WNL SCr 0.93, CrCl ~89 mL/min Afebrile  Today is day #6 of IV antibiotics  Plan: Continue vancomycin 1750 mg IV q24h for estimated AUC of 441 Goal vancomycin AUC 400-550 Monitor renal function. Check vancomycin levels if not discontinued in the next 1-2 days   RECOMMENDATION: This patient is receiving diphenhydramine by the intravenous route.  Based on criteria approved by the Pharmacy and Therapeutics Committee, intravenous diphenhydramine is being converted to the equivalent oral dose form(s).   DESCRIPTION: These criteria include: Diphenhydramine is not prescribed to treat or prevent a severe allergic reaction Diphenhydramine is not prescribed as premedication prior to receiving blood product, biologic medication, antimicrobial, or chemotherapy agent The patient has tolerated at least one dose of an oral or enteral medication The patient has no evidence of active gastrointestinal bleeding or impaired GI absorption (gastrectomy, short bowel, patient on TNA or NPO). The patient is not undergoing procedural sedation   Height: 5\' 6"  (167.6 cm) Weight: (!) 169.3 kg (373 lb 3.8 oz) IBW/kg (Calculated) : 59.3  Temp (24hrs), Avg:98 F (36.7 C), Min:97.5 F (36.4 C), Max:98.5 F (36.9 C)  Recent Labs  Lab 08/20/20 1716 08/20/20 1718 08/21/20 0245 08/22/20 0303 08/23/20 0245 08/24/20 0318  WBC 8.6  --  8.0  --   --  9.4  CREATININE 1.17*  --  1.06* 1.02* 1.17* 0.93  LATICACIDVEN  --  1.0  --   --   --   --      Estimated Creatinine Clearance: 89.2 mL/min (by C-G formula based on SCr of 0.93 mg/dL).    Allergies  Allergen Reactions   Ciprofloxin Hcl [Ciprofloxacin] Other (See Comments)    Unstable gait   Morphine And Related Anaphylaxis    Tolerates hydrocodone   Asa [Aspirin] Hives    Codeine Hives   Nsaids     Lowers GFR   Penicillins Other (See Comments)    Patient does not know      Antimicrobials this admission: vancomycin 6/30 >>   Dose adjustments this admission: 7/2 Vanc 2500 q36 >> 1750 q24  Microbiology results: 6/30 BCx: NGF 6/30 UCx: multiple species  Thank you for allowing pharmacy to be a part of this patient's care.  Peggyann Juba, PharmD, BCPS Pharmacy: [phone] 08/25/2020 10:22 AM

## 2020-08-26 ENCOUNTER — Telehealth: Payer: Self-pay | Admitting: Adult Health

## 2020-08-26 MED ORDER — HYDROCODONE-ACETAMINOPHEN 10-325 MG PO TABS: 1.0000 | ORAL_TABLET | Freq: Four times a day (QID) | ORAL | 0 refills | Status: DC | PRN

## 2020-08-26 MED ORDER — ALBUTEROL SULFATE (2.5 MG/3ML) 0.083% IN NEBU: 2.5000 mg | INHALATION_SOLUTION | Freq: Four times a day (QID) | RESPIRATORY_TRACT | Status: DC | PRN

## 2020-08-26 MED ORDER — DIPHENHYDRAMINE HCL 25 MG PO CAPS: 25.0000 mg | ORAL_CAPSULE | Freq: Four times a day (QID) | ORAL | 0 refills | Status: DC | PRN

## 2020-08-26 MED ORDER — ALBUTEROL SULFATE (2.5 MG/3ML) 0.083% IN NEBU
2.5000 mg | INHALATION_SOLUTION | Freq: Two times a day (BID) | RESPIRATORY_TRACT | Status: DC
  Administered 2020-08-26: 2.5 mg via RESPIRATORY_TRACT
  Filled 2020-08-26: qty 3

## 2020-08-26 MED ORDER — SULFAMETHOXAZOLE-TRIMETHOPRIM 800-160 MG PO TABS: 1.0000 | ORAL_TABLET | Freq: Two times a day (BID) | ORAL | 0 refills | Status: AC

## 2020-08-26 NOTE — TOC Transition Note (Addendum)
Transition of Care [facility]) - CM/SW Discharge Note   Patient Details  Name: Allison Short MRN: [id number] Date of Birth: 1950-11-20  [facility] CM/SW Contact:  Lennart Pall, LCSW Phone Number: 08/26/2020, 10:38 AM   Clinical Narrative:     Pt medically ready for dc today.  [place] visits arranged via [facility].  Have provided contact info for Outpatient lymphedema clinic (@ [facility] on [address] [facility].)  No further TOC needs.  Final next level of care: [place] Barriers to Discharge: Barriers Resolved   Patient Goals and CMS Choice Patient states their goals for this hospitalization and ongoing recovery are:: return home      Discharge Placement                       Discharge Plan and Services                DME Arranged: N/A DME Agency: NA       HH Arranged: RN, PT, OT HH Agency: [facility] Date HH Agency Contacted: 08/25/20 Time HH Agency Contacted: 1400    Social Determinants of Health (SDOH) Interventions     Readmission Risk Interventions No flowsheet data found.

## 2020-08-26 NOTE — Telephone Encounter (Signed)
Okay for verbal orders? Please advise 

## 2020-08-26 NOTE — Discharge Summary (Signed)
Physician Discharge Summary  Allison Short WPY:[id number] DOB: [birth year] DOA: 08/20/2020  PCP: Dorothyann Peng, NP  Admit date: 08/20/2020 Discharge date: 08/26/2020  Admitted From: Home Disposition:  Home  Discharge Condition:Stable CODE STATUS:FULL Diet recommendation: Heart Healthy    Brief/Interim Summary: Patient is a 70 year old female with history of hypertension, lymphedema of bilateral lower extremities, history of non-Hodgkin's lymphoma who was sent from [facility] for direct admission for the management of right lower extremity cellulitis.  Patient reported swelling, erythematous changes, weeping drainage from the right leg.  She has limited mobility and cannot bear weight on her right leg, lives alone.  She has history of bilateral knee replacement.  Started on IV vancomycin, wound care consulted.  Right lower extremity cellulitis looks much better.  She is medically stable for discharge home today with oral antibiotics.  Following problems were addressed during her hospitalization:   Cellulitis of right lower extremity: Presented with pain, swelling, erythematous changes, weeping drainage from the right leg.  No ulcers identified.  Started on vancomycin for coverage of nonpurulent cellulitis. Bllood cultures, no growth till date. Venous Doppler negative for DVT Wound care nurse was consulted.  Recommended the legs to be wrapped  with Kerlix and Ace wraps. Right leg is still erythematous but I think there is significant improvement in her cellulitis.  The erythematous changes are from chronic venous stasis changes not necessary from active infection. there is less drainage from the wound.  The erythematous changes can take some time to go away. She will be discharged on Bactrim.  bilateral lower extremity lymphedema: We strongly recommend to follow-up with lymphedema clinic as an outpatient.  Social worker will provide referral/information   hypertension: Continue home  medications: Lotensin, metoprolol.     AKI: resolved   Morbid obesity: BMI of 60.2   Debility/deconditioning: PT/OT consulted, recommended home health on discharge.  RN added for home health  Discharge Diagnoses:  Principal Problem:   Cellulitis of right lower extremity Active Problems:   Essential hypertension   Morbid obesity ([place])   Hypokalemia   AKI (acute kidney injury) [facility])    Discharge Instructions  Discharge Instructions     Diet - low sodium heart healthy   Complete by: As directed    Discharge instructions   Complete by: As directed    1)Please take prescribed medications as instructed. 2)Follow up with your PCP in a week 3)Follow up with home health.  Follow-up with the  lymphedema clinic   Discharge wound care:   Complete by: As directed    As above   Increase activity slowly   Complete by: As directed       Allergies as of 08/26/2020       Reactions   Ciprofloxin Hcl [ciprofloxacin] Other (See Comments)   Unstable gait   Morphine And Related Anaphylaxis   Tolerates hydrocodone   Asa [aspirin] Hives   Codeine Hives   Nsaids    Lowers GFR   Penicillins Other (See Comments)   Patient does not know         Medication List     STOP taking these medications    buPROPion 150 MG 12 hr tablet Commonly known as: WELLBUTRIN SR   doxycycline 100 MG capsule Commonly known as: VIBRAMYCIN   fluconazole 150 MG tablet Commonly known as: DIFLUCAN       TAKE these medications    acetaminophen 500 MG tablet Commonly known as: TYLENOL Take 500 mg by mouth every 6 (six)  hours as needed for moderate pain.   benazepril-hydrochlorthiazide 20-25 MG tablet Commonly known as: LOTENSIN HCT TAKE 1 TABLET EVERY DAY   diphenhydrAMINE 25 mg capsule Commonly known as: BENADRYL Take 1 capsule (25 mg total) by mouth every 6 (six) hours as needed for itching.   HYDROcodone-acetaminophen 10-325 MG tablet Commonly known as: NORCO Take 1 tablet by mouth  every 6 (six) hours as needed for moderate pain.   metoprolol tartrate 50 MG tablet Commonly known as: LOPRESSOR TAKE 1 TABLET TWICE DAILY   sulfamethoxazole-trimethoprim 800-160 MG tablet Commonly known as: BACTRIM DS Take 1 tablet by mouth 2 (two) times daily for 4 days.   VITAMIN D PO Take 1,000 Units by mouth daily.               Discharge Care Instructions  (From admission, onward)           Start     Ordered   08/26/20 0000  Discharge wound care:       Comments: As above   08/26/20 1016            Follow-up Information     [facility] Follow [facility].   Specialty: Home Health Services Why: will call on Sunday or Monday to make starting appointment with you. Contact information: [address] [place] [address] [phone]         Dorothyann Peng, NP. Schedule an appointment as soon as possible for a visit in 1 week(s).   Specialty: Family Medicine Contact information: [address] [place] [address] [phone]                Allergies  Allergen Reactions   Ciprofloxin Hcl [Ciprofloxacin] Other (See Comments)    Unstable gait   Morphine And Related Anaphylaxis    Tolerates hydrocodone   Asa [Aspirin] Hives   Codeine Hives   Nsaids     Lowers GFR   Penicillins Other (See Comments)    Patient does not know      Consultations: None   Procedures/Studies: CT Angio Chest Pulmonary Embolism (PE) W or WO Contrast  Result Date: 08/21/2020 CLINICAL DATA:  Low O2 sats. EXAM: CT ANGIOGRAPHY CHEST WITH CONTRAST TECHNIQUE: Multidetector CT imaging of the chest was performed using the standard protocol during bolus administration of intravenous contrast. Multiplanar CT image reconstructions and MIPs were obtained to evaluate the vascular anatomy. CONTRAST:  120mL OMNIPAQUE IOHEXOL 350 MG/ML SOLN COMPARISON:  07/09/2015 FINDINGS: Cardiovascular: No filling defects in the pulmonary arteries to suggest  pulmonary emboli. Heart is mildly enlarged. Aorta normal caliber. Scattered aortic calcifications. Mediastinum/Nodes: No mediastinal, hilar, or axillary adenopathy. Trachea and esophagus are unremarkable. Lungs/Pleura: Lungs are clear. No focal airspace opacities or suspicious nodules. No effusions. Upper Abdomen: Presumed large cysts within the right hepatic lobe partially imaged measuring up to 12 cm. Prior cholecystectomy. Musculoskeletal: Chest wall soft tissues are unremarkable. No acute bony abnormality. Review of the MIP images confirms the above findings. IMPRESSION: No evidence of pulmonary embolus. Mild cardiomegaly. No acute cardiopulmonary disease. Presumed large right hepatic cyst partially imaged on the lowest images measuring up to 12 cm. This could be further evaluated with ultrasound or CT if felt clinically indicated. Aortic Atherosclerosis (ICD10-I70.0). Electronically Signed   By: Rolm Baptise M.D.   On: 08/21/2020 00:14   US Venous Img Lower Unilateral Right  Result Date: 08/20/2020 CLINICAL DATA:  70 year old with right foot redness and edema. EXAM: RIGHT LOWER EXTREMITY VENOUS DOPPLER ULTRASOUND  TECHNIQUE: Gray-scale sonography with graded compression, as well as color Doppler and duplex ultrasound were performed to evaluate the lower extremity deep venous systems from the level of the common femoral vein and including the common femoral, femoral, profunda femoral, popliteal and calf veins including the posterior tibial, peroneal and gastrocnemius veins when visible. The superficial great saphenous vein was also interrogated. Spectral Doppler was utilized to evaluate flow at rest and with distal augmentation maneuvers in the common femoral, femoral and popliteal veins. COMPARISON:  None. FINDINGS: Contralateral Common Femoral Vein: No evidence of thrombus. Normal compressibility and color Doppler flow. Common Femoral Vein: No evidence of thrombus. Normal compressibility, respiratory  phasicity and response to augmentation. Saphenofemoral Junction: No evidence of thrombus. Normal compressibility and flow on color Doppler imaging. Profunda Femoral Vein: No evidence of thrombus. Normal compressibility and flow on color Doppler imaging. Femoral Vein: No evidence of thrombus. Normal compressibility, respiratory phasicity and response to augmentation. Popliteal Vein: No evidence of thrombus. Normal compressibility, respiratory phasicity and response to augmentation. Calf Veins: Visualized right deep calf veins are patent without thrombus. Limited evaluation. Superficial Great Saphenous Vein: No evidence of thrombus. Normal compressibility. Other Findings: Prominent right inguinal lymph node measuring 5.1 x 2.3 x 3.4 cm. Lymph node is slightly lobulated. IMPRESSION: 1. Negative for deep venous thrombosis in right lower extremity. Study has technical limitations due to the edema and body habitus. 2. Prominent right inguinal lymph node is likely reactive based on history and right lower extremity edema. Electronically Signed   By: Markus Daft M.D.   On: 08/20/2020 17:16   DG Chest Port 1 View  Result Date: 08/20/2020 CLINICAL DATA:  70 year old female with concern for sepsis. EXAM: PORTABLE CHEST 1 VIEW COMPARISON:  Chest radiograph dated 07/09/2015. FINDINGS: No focal consolidation, pleural effusion, or pneumothorax. The cardiac silhouette is within limits. No acute osseous pathology. IMPRESSION: No active disease. Electronically Signed   By: Anner Crete M.D.   On: 08/20/2020 16:54      Subjective:  Patient seen and examined the bedside this morning.  Medically stable for discharge today.   Discharge Exam: Vitals:   08/25/20 2205 08/26/20 0513  BP:  (!) 145/69  Pulse:  79  Resp:  17  Temp:  98.1 F (36.7 C)  SpO2: 94% 93%   Vitals:   08/25/20 1408 08/25/20 2047 08/25/20 2205 08/26/20 0513  BP:  130/61  (!) 145/69  Pulse:  95  79  Resp:  18  17  Temp:  98.1 F (36.7 C)   98.1 F (36.7 C)  TempSrc:  Oral  Oral  SpO2: 98% 93% 94% 93%  Weight:      Height:        General: Pt is alert, awake, not in acute distress,obese Cardiovascular: RRR, S1/S2 +, no rubs, no gallops Respiratory: CTA bilaterally, no wheezing, no rhonchi Abdominal: Soft, NT, ND, bowel sounds + Extremities: Bilateral lower extremity lymphedema, no cyanosis    The results of significant diagnostics from this hospitalization (including imaging, microbiology, ancillary and laboratory) are listed below for reference.     Microbiology: Recent Results (from the past 240 hour(s))  Blood culture (routine single)     Status: None   Collection Time: 08/20/20  5:15 PM   Specimen: BLOOD  Result Value Ref Range Status   Specimen Description   Final    BLOOD LEFT ANTECUBITAL Performed at [facility], [address], [place], [place] [address]    Special Requests   Final  Blood Culture adequate volume BOTTLES DRAWN AEROBIC AND ANAEROBIC Performed at [facility], [address] [place], [place] [address]    Culture   Final    NO GROWTH 5 DAYS Performed at [facility], [place] [address] [place], [place] [address]    Report Status 08/25/2020 FINAL  Final  Resp Panel by RT-PCR (Flu A&B, Covid) Nasopharyngeal Swab     Status: None   Collection Time: 08/20/20  5:18 PM   Specimen: Nasopharyngeal Swab; Nasopharyngeal(NP) swabs in vial transport medium  Result Value Ref Range Status   SARS Coronavirus 2 by RT PCR NEGATIVE NEGATIVE Final    Comment: (NOTE) SARS-CoV-2 target nucleic acids are NOT DETECTED.  The SARS-CoV-2 RNA is generally detectable in upper respiratory specimens during the acute phase of infection. The lowest concentration of SARS-CoV-2 viral copies this assay can detect is 138 copies/mL. A negative result does not preclude SARS-Cov-2 infection and should not be used as the sole basis for treatment or other patient management  decisions. A negative result may occur with  improper specimen collection/handling, submission of specimen other than nasopharyngeal swab, presence of viral mutation(s) within the areas targeted by this assay, and inadequate number of viral copies(<138 copies/mL). A negative result must be combined with clinical observations, patient history, and epidemiological information. The expected result is Negative.  Fact Sheet for Patients:  EntrepreneurPulse.com.au  Fact Sheet for Healthcare Providers:  IncredibleEmployment.be  This test is no t yet approved or cleared by the [place] FDA and  has been authorized for detection and/or diagnosis of SARS-CoV-2 by FDA under an Emergency Use Authorization (EUA). This EUA will remain  in effect (meaning this test can be used) for the duration of the COVID-19 declaration under Section 564(b)(1) of the Act, 21 U.S.C.section 360bbb-3(b)(1), unless the authorization is terminated  or revoked sooner.       Influenza A by PCR NEGATIVE NEGATIVE Final   Influenza B by PCR NEGATIVE NEGATIVE Final    Comment: (NOTE) The Xpert Xpress SARS-CoV-2/FLU/RSV plus assay is intended as an aid in the diagnosis of influenza from Nasopharyngeal swab specimens and should not be used as a sole basis for treatment. Nasal washings and aspirates are unacceptable for Xpert Xpress SARS-CoV-2/FLU/RSV testing.  Fact Sheet for Patients: EntrepreneurPulse.com.au  Fact Sheet for Healthcare Providers: IncredibleEmployment.be  This test is not yet approved or cleared by the [place] FDA and has been authorized for detection and/or diagnosis of SARS-CoV-2 by FDA under an Emergency Use Authorization (EUA). This EUA will remain in effect (meaning this test can be used) for the duration of the COVID-19 declaration under Section 564(b)(1) of the Act, 21 U.S.C. section 360bbb-3(b)(1), unless the  authorization is terminated or revoked.  Performed at [facility], [address] [place], [place] [address]   Urine culture     Status: Abnormal   Collection Time: 08/20/20  7:58 PM   Specimen: In/Out Cath Urine  Result Value Ref Range Status   Specimen Description   Final    IN/OUT CATH URINE Performed at [facility], [address] [place], [place] [address]    Special Requests   Final    NONE Performed at [facility], [address] [place], [place] [address]    Culture MULTIPLE SPECIES PRESENT, SUGGEST RECOLLECTION (A)  Final   Report Status 08/22/2020 FINAL  Final     Labs: BNP (last 3 results) No results for input(s): BNP in the last 8760 hours.  Basic Metabolic Panel: Recent Labs  Lab 08/20/20 1716 08/20/20 2253 08/21/20 0245 08/22/20 0303 08/23/20 0245 08/24/20 0318  NA 140  --  140 138  --  139  K 3.0*  --  3.4* 3.9  --  3.9  CL 98  --  100 99  --  98  CO2 31  --  30 32  --  31  GLUCOSE 120*  --  151* 124*  --  129*  BUN 31*  --  28* 21  --  14  CREATININE 1.17*  --  1.06* 1.02* 1.17* 0.93  CALCIUM 9.2  --  8.7* 8.5*  --  8.8*  MG  --  1.7  --   --   --   --    Liver Function Tests: Recent Labs  Lab 08/20/20 1716  AST 9*  ALT 16  ALKPHOS 59  BILITOT 0.6  PROT 7.1  ALBUMIN 3.6   No results for input(s): LIPASE, AMYLASE in the last 168 hours. No results for input(s): AMMONIA in the last 168 hours. CBC: Recent Labs  Lab 08/20/20 1716 08/21/20 0245 08/24/20 0318  WBC 8.6 8.0 9.4  NEUTROABS 5.8  --  6.2  HGB 11.9* 11.6* 11.1*  HCT 37.3 38.3 35.3*  MCV 101.6* 105.5* 103.2*  PLT 236 210 271   Cardiac Enzymes: No results for input(s): CKTOTAL, CKMB, CKMBINDEX, TROPONINI in the last 168 hours. BNP: Invalid input(s): POCBNP CBG: No results for input(s): GLUCAP in the last 168 hours. D-Dimer No results for input(s): DDIMER in the last 72 hours. Hgb A1c No results for  input(s): HGBA1C in the last 72 hours. Lipid Profile No results for input(s): CHOL, HDL, LDLCALC, TRIG, CHOLHDL, LDLDIRECT in the last 72 hours. Thyroid function studies No results for input(s): TSH, T4TOTAL, T3FREE, THYROIDAB in the last 72 hours.  Invalid input(s): FREET3 Anemia work up No results for input(s): VITAMINB12, FOLATE, FERRITIN, TIBC, IRON, RETICCTPCT in the last 72 hours. Urinalysis    Component Value Date/Time   COLORURINE YELLOW 08/20/2020 1958   APPEARANCEUR CLEAR 08/20/2020 1958   LABSPEC 1.017 08/20/2020 1958   PHURINE 6.0 08/20/2020 1958   GLUCOSEU NEGATIVE 08/20/2020 1958   HGBUR NEGATIVE 08/20/2020 1958   BILIRUBINUR NEGATIVE 08/20/2020 1958   KETONESUR NEGATIVE 08/20/2020 1958   PROTEINUR NEGATIVE 08/20/2020 1958   NITRITE NEGATIVE 08/20/2020 1958   LEUKOCYTESUR NEGATIVE 08/20/2020 1958   Sepsis Labs Invalid input(s): PROCALCITONIN,  WBC,  LACTICIDVEN Microbiology Recent Results (from the past 240 hour(s))  Blood culture (routine single)     Status: None   Collection Time: 08/20/20  5:15 PM   Specimen: BLOOD  Result Value Ref Range Status   Specimen Description   Final    BLOOD LEFT ANTECUBITAL Performed at [facility], [address], [place], [place] [address]    Special Requests   Final    Blood Culture adequate volume BOTTLES DRAWN AEROBIC AND ANAEROBIC Performed at [facility], [address] [place], [place] [address]    Culture   Final    NO GROWTH 5 DAYS Performed at [facility], [place] [address], [place], [place] [address]    Report Status 08/25/2020 FINAL  Final  Resp Panel by RT-PCR (Flu A&B, Covid) Nasopharyngeal Swab     Status: None   Collection Time: 08/20/20  5:18 PM   Specimen: Nasopharyngeal Swab; Nasopharyngeal(NP) swabs in vial transport medium  Result Value Ref Range Status   SARS Coronavirus 2 by RT PCR  NEGATIVE NEGATIVE Final    Comment: (NOTE) SARS-CoV-2 target nucleic  acids are NOT DETECTED.  The SARS-CoV-2 RNA is generally detectable in upper respiratory specimens during the acute phase of infection. The lowest concentration of SARS-CoV-2 viral copies this assay can detect is 138 copies/mL. A negative result does not preclude SARS-Cov-2 infection and should not be used as the sole basis for treatment or other patient management decisions. A negative result may occur with  improper specimen collection/handling, submission of specimen other than nasopharyngeal swab, presence of viral mutation(s) within the areas targeted by this assay, and inadequate number of viral copies(<138 copies/mL). A negative result must be combined with clinical observations, patient history, and epidemiological information. The expected result is Negative.  Fact Sheet for Patients:  EntrepreneurPulse.com.au  Fact Sheet for Healthcare Providers:  IncredibleEmployment.be  This test is no t yet approved or cleared by the [place] FDA and  has been authorized for detection and/or diagnosis of SARS-CoV-2 by FDA under an Emergency Use Authorization (EUA). This EUA will remain  in effect (meaning this test can be used) for the duration of the COVID-19 declaration under Section 564(b)(1) of the Act, 21 U.S.C.section 360bbb-3(b)(1), unless the authorization is terminated  or revoked sooner.       Influenza A by PCR NEGATIVE NEGATIVE Final   Influenza B by PCR NEGATIVE NEGATIVE Final    Comment: (NOTE) The Xpert Xpress SARS-CoV-2/FLU/RSV plus assay is intended as an aid in the diagnosis of influenza from Nasopharyngeal swab specimens and should not be used as a sole basis for treatment. Nasal washings and aspirates are unacceptable for Xpert Xpress SARS-CoV-2/FLU/RSV testing.  Fact Sheet for Patients: EntrepreneurPulse.com.au  Fact Sheet for Healthcare Providers: IncredibleEmployment.be  This  test is not yet approved or cleared by the [place] FDA and has been authorized for detection and/or diagnosis of SARS-CoV-2 by FDA under an Emergency Use Authorization (EUA). This EUA will remain in effect (meaning this test can be used) for the duration of the COVID-19 declaration under Section 564(b)(1) of the Act, 21 U.S.C. section 360bbb-3(b)(1), unless the authorization is terminated or revoked.  Performed at [facility], [address], [place], [place] [address]   Urine culture     Status: Abnormal   Collection Time: 08/20/20  7:58 PM   Specimen: In/Out Cath Urine  Result Value Ref Range Status   Specimen Description   Final    IN/OUT CATH URINE Performed at [facility], [address], [place], [place] [address]    Special Requests   Final    NONE Performed at [facility], [address] [place], [place] [address]    Culture MULTIPLE SPECIES PRESENT, SUGGEST RECOLLECTION (A)  Final   Report Status 08/22/2020 FINAL  Final    Please note: You were cared for by a hospitalist during your hospital stay. Once you are discharged, your primary care physician will handle any further medical issues. Please note that NO REFILLS for any discharge medications will be authorized once you are discharged, as it is imperative that you return to your primary care physician (or establish a relationship with a primary care physician if you do not have one) for your post hospital discharge needs so that they can reassess your need for medications and monitor your lab values.    Time coordinating discharge: 40 minutes  SIGNED:   Shelly Coss, MD  [facility] 08/26/2020, 10:16 AM Pager [phone]  If 7PM-7AM, please contact night-coverage [email or web address] Password [email or web address]

## 2020-08-26 NOTE — Progress Notes (Signed)
Chaplain engaged in an initial visit with Allison Short who expressed that her time at the hospital has been wonderful.  She praised the staff, the food, and the care that she has received.  [name] also talked about the aide she has at home to care for herself and that she has support for her recovery.    Chaplain and Donnamarie also spent time talking about the significance, beauty and quirkiness of names.  She called a person's name that she had recently met, as well as the ways her own name has been utilized for [employer] or humor.    Chaplain offered listening, presence, and support.     08/26/20 1200  Clinical Encounter Type  Visited With Patient  Visit Type Initial

## 2020-08-26 NOTE — Telephone Encounter (Signed)
Tanya w/[facility] is calling in to see if the provider will follow the pt w/the following orders Nursing, PT and OT pt is being discharged from the hospital [facility]) today 08/26/2020 with [place] orders.  May leave detail msg on secured line.

## 2020-08-27 NOTE — Telephone Encounter (Signed)
Verbal orders given to Tanya.  

## 2020-09-01 ENCOUNTER — Other Ambulatory Visit: Payer: Self-pay

## 2020-09-02 ENCOUNTER — Telehealth: Payer: Self-pay | Admitting: Adult Health

## 2020-09-02 ENCOUNTER — Encounter: Payer: Self-pay | Admitting: Adult Health

## 2020-09-02 ENCOUNTER — Ambulatory Visit (INDEPENDENT_AMBULATORY_CARE_PROVIDER_SITE_OTHER): Payer: Medicare HMO | Admitting: Adult Health

## 2020-09-02 VITALS — BP 110/80 | HR 77 | Temp 97.3°F | Ht 66.0 in

## 2020-09-02 DIAGNOSIS — C8591 Non-Hodgkin lymphoma, unspecified, lymph nodes of head, face, and neck: Secondary | ICD-10-CM

## 2020-09-02 DIAGNOSIS — L03115 Cellulitis of right lower limb: Secondary | ICD-10-CM

## 2020-09-02 DIAGNOSIS — Z87448 Personal history of other diseases of urinary system: Secondary | ICD-10-CM

## 2020-09-02 DIAGNOSIS — I10 Essential (primary) hypertension: Secondary | ICD-10-CM | POA: Diagnosis not present

## 2020-09-02 DIAGNOSIS — N179 Acute kidney failure, unspecified: Secondary | ICD-10-CM

## 2020-09-02 NOTE — Progress Notes (Signed)
Subjective:    Patient ID: Allison Short, female    DOB: [birth year], 70 y.o.   MRN: [id number]  HPI 70 year old female who  has a past medical history of History of blood transfusion, History of urinary tract infection, Hypertension, Non Hodgkin's lymphoma ([place]) (2008), Obesity, Osteopenia (04/2017), and Pancreatitis.  She presents to the office today for TCM visit   Admit Date 08/20/2020 Discharge Date 08/26/2020  He was sent from [facility] for direct admission for the management of right lower extremity cellulitis.  Patient reported swelling, erythema he has changes, weeping drainage from the right leg.  She was limited to mobility and could not bear weight on her right leg, lives alone.  She was started on IV vancomycin and wound care was consulted.  Upon discharge her right lower extremity cellulitis looks much better and was discharged home with oral antibiotics.  Hospital Course   Cellulitis of right lower extremity -No ulcers identified.  As mentioned above he was started on vancomycin for coverage of nonpurulent cellulitis.  Blood cultures showed no growth.  Venous Doppler negative for DVT.  Wound care nurse was consulted.  Recommended that legs be wrapped with Kerlix and Ace wraps.  The right leg was still erythematous upon discharge but with significant improvement in cellulitis.  Was thought that her erythematous changes were from chronic venous stasis changes not necessarily from active infection.  She was discharged on Bactrim  Bilateral lower extremity lymphedema -Strongly recommended to follow-up with lymphedema clinic as outpatient.  Social worker provided referral  Hypertension  -Was continued on home medications of Lotensin and metoprolol  AKI -Resolved  Debility/Deconditioning  -PT/OT consulted and recommended home health on discharge.  RN added for home health.  Today she reports that she is feeling much better, her pain is "100% better than when I was in the  hospital".  She was discharged with 20 tabs of hydrocodone and has only taken 3.  She has been using Tylenol at home for pain relief.  Physical therapy came for their initial evaluation yesterday and they plan on coming once a week.  Wound care has been coming twice a week.  Has not contacted the lymphedema clinic just yet, she reports that she is going to have trouble with transportation as her son will not be able to drive her to every visit.  She has not had any additional drainage from her right lower extremity.  Denies fevers, chills, or feeling acutely ill   Review of Systems  Constitutional: Negative.   Respiratory: Negative.    Cardiovascular:  Positive for leg swelling.  Endocrine: Negative.   Genitourinary: Negative.   Musculoskeletal: Negative.   Skin:  Positive for color change.  Neurological: Negative.   Hematological: Negative.   Psychiatric/Behavioral: Negative.    All other systems reviewed and are negative. Past Medical History:  Diagnosis Date   History of blood transfusion    2016   History of urinary tract infection    Hypertension    Non Hodgkin's lymphoma ([place]) 2008   Obesity    Osteopenia 04/2017   T score -1.4 FRAX 7.5% / 0.8%   Pancreatitis     Social History   Socioeconomic History   Marital status: Divorced    Spouse name: Not on file   Number of children: Not on file   Years of education: Not on file   Highest education level: Not on file  Occupational History   Not on file  Tobacco Use   Smoking status: Never   Smokeless tobacco: Never  Vaping Use   Vaping Use: Never used  Substance and Sexual Activity   Alcohol use: Yes    Alcohol/week: 0.0 standard drinks    Comment: Occas   Drug use: No   Sexual activity: Not Currently    Comment: 1st intercourse 15 yo-5 partners  Other Topics Concern   Not on file  Social History Narrative   Not on file   Social Determinants of Health   Financial Resource Strain: Not on file  Food  Insecurity: Not on file  Transportation Needs: Not on file  Physical Activity: Not on file  Stress: Not on file  Social Connections: Not on file  Intimate Partner Violence: Not on file    Past Surgical History:  Procedure Laterality Date   BIOPSY THYROID     BONE MARROW BIOPSY     CESAREAN SECTION  10/26/1975   CHOLECYSTECTOMY     2006   PORTA CATH INSERTION     TONSILLECTOMY AND ADENOIDECTOMY     1952   TOTAL KNEE ARTHROPLASTY Bilateral    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      benazepril-hydrochlorthiazide (LOTENSIN HCT) 20-25 MG tablet TAKE 1 TABLET EVERY DAY 90 tablet 0   Cholecalciferol (VITAMIN D PO) Take 1,000 Units by mouth daily.      diphenhydrAMINE (BENADRYL) 25 mg capsule Take 1 capsule (25 mg total) by mouth every 6 (six) hours as needed for itching. 30 capsule 0   HYDROcodone-acetaminophen (NORCO) 10-325 MG tablet Take 1 tablet by mouth every 6 (six) hours as needed for moderate pain. 20 tablet 0   metoprolol tartrate (LOPRESSOR) 50 MG tablet TAKE 1 TABLET TWICE DAILY 180 tablet 0   No current facility-administered medications on file prior to visit.    BP 110/80   Pulse 77   Temp (!) 97.3 F (36.3 C) (Oral)   Ht 5' 6" (1.676 m)   SpO2 95%   BMI 60.24 kg/m       Objective:   Physical Exam Vitals and nursing note reviewed.  Constitutional:      Appearance: Normal appearance.   Cardiovascular:     Rate and Rhythm: Normal rate and regular rhythm.     Pulses: Normal pulses.     Heart sounds: Normal heart sounds.  Pulmonary:     Effort: Pulmonary effort is normal.     Breath sounds: Normal breath sounds.  Musculoskeletal:     Comments: Significant lymphedema noted bilateral lower extremities.   Dryness noted to right foot with mild swelling to toes. No active infection noted. No weeping present   Skin:    General: Skin is warm and dry.     Capillary Refill: Capillary refill takes less than  2 seconds.     Comments: Chronic venous stasis changes noted on right lower extremity    Neurological:     General: No focal deficit present.     Mental Status: She is alert and oriented to person, place, and time.     Gait: Gait abnormal (walks with rolling walker).  Psychiatric:        Mood and Affect: Mood normal.        Behavior: Behavior normal.        Thought Content: Thought content normal.        Judgment: Judgment normal.      Assessment & Plan:  1. Cellulitis of right lower extremity - Reviewed hospital noted, discharge instructions, imaging and labs. All questions answered to the best of my ability.  - Seems to have resolved. She has finished her oral abx therapy.  - Continue with wound care as directed and PT  - Advised to let me know if she needs additional pain medication.   2. Essential hypertension - Well controlled. Continue with current medications   3. Morbid obesity ([place]) - Needs to work on weight loss   4. AKI (acute kidney injury) ([place]) - Resolved   5. 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 Lymphedema clinic will be very helpful for her  - AMB Referral to [place], NP

## 2020-09-02 NOTE — Telephone Encounter (Signed)
   Telephone encounter was:  Successful.  09/02/2020 Name: Allison Short MRN: [id number] DOB: 04/24/1950  [name] is a 69 y.o. year old female who is a primary care patient of Dorothyann Peng, NP . The community resource team was consulted for assistance with Transportation Needs   Care guide performed the following interventions: Patient provided with information about care guide support team and interviewed to confirm resource needs Discussed resources to assist with transportation with [facility]. She had a bad experience with them once before but will try again. If it doesn't work out, I gave her the information for [employer] as a back up .  Follow Up Plan:  No further follow up planned at this time. The patient has been provided with needed resources.  April Green Care Guide, Embedded Care Coordination [place], Care Management Phone: [phone] Email: [email or web address]

## 2020-09-02 NOTE — Patient Instructions (Signed)
It was great seeing you today   I will reach out to our social work to see if we can get you transportation to the lymphodema clinic - this will help out tremendously.   Let me know if you need more pain medication

## 2020-09-04 ENCOUNTER — Telehealth: Payer: Self-pay | Admitting: Adult Health

## 2020-09-04 NOTE — Telephone Encounter (Signed)
Okay for verbal orders? Please advise 

## 2020-09-04 NOTE — Telephone Encounter (Signed)
Left message to return phone call.

## 2020-09-04 NOTE — Telephone Encounter (Signed)
Allison Short from [facility] call and want a verbal order for  pt 1 wk x 3 starting today . Allison Short is  OT with [facility] and his # is [phone].

## 2020-09-07 NOTE — Telephone Encounter (Signed)
Verbal orders given to [place].

## 2020-09-09 ENCOUNTER — Ambulatory Visit (INDEPENDENT_AMBULATORY_CARE_PROVIDER_SITE_OTHER): Payer: Medicare HMO

## 2020-09-09 DIAGNOSIS — Z1231 Encounter for screening mammogram for malignant neoplasm of breast: Secondary | ICD-10-CM | POA: Diagnosis not present

## 2020-09-09 DIAGNOSIS — Z0000 Encounter for general adult medical examination without abnormal findings: Secondary | ICD-10-CM | POA: Diagnosis not present

## 2020-09-09 DIAGNOSIS — Z0100 Encounter for examination of eyes and vision without abnormal findings: Secondary | ICD-10-CM | POA: Diagnosis not present

## 2020-09-09 NOTE — Progress Notes (Signed)
Subjective:   Allison Short is a 70 y.o. female who presents for an Initial Medicare Annual Wellness Visit.  Virtual Visit via Video Note  I connected with Allison Short by a video enabled telemedicine application and verified that I am speaking with the correct person using two identifiers.  Location: Patient: Home Provider: Office Persons participating in the virtual visit: patient, provider   I discussed the limitations of evaluation and management by telemedicine and the availability of in person appointments. The patient expressed understanding and agreed to proceed.     Mickel Baas Dalen [place],LPN   Review of Systems    N/a       Objective:    There were no vitals filed for this visit. There is no height or weight on file to calculate BMI.  Advanced Directives 08/20/2020 08/20/2020 07/08/2015  Does Patient Have a Medical Advance Directive? No No No  Would patient like information on creating a medical advance directive? No - Patient declined - -    Current Medications (verified) Outpatient Encounter Medications as of 09/09/2020  Medication Sig   acetaminophen (TYLENOL) 500 MG tablet Take 500 mg by mouth every 6 (six) hours as needed for moderate pain.    benazepril-hydrochlorthiazide (LOTENSIN HCT) 20-25 MG tablet TAKE 1 TABLET EVERY DAY   Cholecalciferol (VITAMIN D PO) Take 1,000 Units by mouth daily.    diphenhydrAMINE (BENADRYL) 25 mg capsule Take 1 capsule (25 mg total) by mouth every 6 (six) hours as needed for itching.   HYDROcodone-acetaminophen (NORCO) 10-325 MG tablet Take 1 tablet by mouth every 6 (six) hours as needed for moderate pain.   metoprolol tartrate (LOPRESSOR) 50 MG tablet TAKE 1 TABLET TWICE DAILY   No facility-administered encounter medications on file as of 09/09/2020.    Allergies (verified) Ciprofloxin hcl [ciprofloxacin], Morphine and related, Asa [aspirin], Codeine, Nsaids, and Penicillins   History: Past Medical History:  Diagnosis Date   History  of blood transfusion    2016   History of urinary tract infection    Hypertension    Non Hodgkin's lymphoma ([place]) 2008   Obesity    Osteopenia 04/2017   T score -1.4 FRAX 7.5% / 0.8%   Pancreatitis    Past Surgical History:  Procedure Laterality Date   BIOPSY THYROID     BONE MARROW BIOPSY     CESAREAN SECTION  10/26/1975   CHOLECYSTECTOMY     2006   PORTA CATH INSERTION     TONSILLECTOMY AND ADENOIDECTOMY     1952   TOTAL KNEE ARTHROPLASTY Bilateral    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     Family History  Problem Relation Age of Onset   Arthritis Maternal Grandmother    Diabetes Son    Social History   Socioeconomic History   Marital status: Divorced    Spouse name: Not on file   Number of children: Not on file   Years of education: Not on file   Highest education level: Not on file  Occupational History   Not on file  Tobacco Use   Smoking status: Never   Smokeless tobacco: Never  Vaping Use   Vaping Use: Never used  Substance and Sexual Activity   Alcohol use: Yes    Alcohol/week: 0.0 standard drinks    Comment: Occas   Drug use: No   Sexual activity: Not Currently    Comment: 1st intercourse 15 yo-5 partners  Other Topics Concern   Not on file  Social History Narrative   Not on  file   Social Determinants of Health   Financial Resource Strain: Not on file  Food Insecurity: Not on file  Transportation Needs: Not on file  Physical Activity: Not on file  Stress: Not on file  Social Connections: Not on file    Tobacco Counseling Counseling given: Not Answered   Clinical Intake:                 Diabetic?no         Activities of Daily Living In your present state of health, do you have any difficulty performing the following activities: 08/20/2020  Hearing? N  Vision? N  Difficulty concentrating or making decisions? N  Walking or climbing stairs? Y  Dressing or bathing? Y  Doing errands, shopping? Y  Some recent data might be hidden     Patient Care Team: Dorothyann Peng, NP as PCP - General (Family Medicine) Fontaine, Belinda Block, MD (Inactive) as Consulting Physician (Gynecology)  Indicate any recent Medical Services you may have received from other than [facility] providers in the past year (date may be approximate).     Assessment:   This is a routine wellness examination for [facility].  Hearing/Vision screen No results found.  Dietary issues and exercise activities discussed:     Goals Addressed   None    Depression Screen PHQ 2/9 Scores 07/08/2015  PHQ - 2 Score 0    Fall Risk Fall Risk  07/08/2015  Falls in the past year? No    FALL RISK PREVENTION PERTAINING TO THE HOME:  Any stairs in or around the home? No  If so, are there any without handrails? No  Home free of loose throw rugs in walkways, pet beds, electrical cords, etc? Yes  Adequate lighting in your home to reduce risk of falls? Yes   ASSISTIVE DEVICES UTILIZED TO PREVENT FALLS:  Life alert? No  Use of a cane, walker or w/c? No  Grab bars in the bathroom? Yes  Shower chair or bench in shower? Yes  Elevated toilet seat or a handicapped toilet? No   Cognitive Function:    Normal cognitive status assessed by direct observation by this Nurse Health Advisor. No abnormalities found.      Immunizations  There is no immunization history on file for this patient.  TDAP status: Due, Education has been provided regarding the importance of this vaccine. Advised may receive this vaccine at local pharmacy or Health Dept. Aware to provide a copy of the vaccination record if obtained from local pharmacy or Health Dept. Verbalized acceptance and understanding.  Flu Vaccine status: Up to date  Pneumococcal vaccine status: Declined,  Education has been provided regarding the importance of this vaccine but patient still declined. Advised may receive this vaccine at local pharmacy or Health Dept. Aware to provide a copy of the vaccination record if  obtained from local pharmacy or Health Dept. Verbalized acceptance and understanding.   Covid-19 vaccine status: Completed vaccines  Qualifies for Shingles Vaccine? Yes   Zostavax completed No   Shingrix Completed?: No.    Education has been provided regarding the importance of this vaccine. Patient has been advised to call insurance company to determine out of pocket expense if they have not yet received this vaccine. Advised may also receive vaccine at local pharmacy or Health Dept. Verbalized acceptance and understanding.  Screening Tests Health Maintenance  Topic Date Due   COVID-19 Vaccine (1) Never done   Hepatitis C Screening  Never done   Zoster Vaccines-  Shingrix (1 of 2) Never done   PNA vac Low Risk Adult (1 of 2 - PCV13) Never done   COLONOSCOPY (Pts 45-79yr Insurance coverage will need to be confirmed)  03/25/2019   MAMMOGRAM  04/21/2019   TETANUS/TDAP  02/22/2020   INFLUENZA VACCINE  09/21/2020   DEXA SCAN  Completed   HPV VACCINES  Aged Out    Health Maintenance  Health Maintenance Due  Topic Date Due   COVID-19 Vaccine (1) Never done   Hepatitis C Screening  Never done   Zoster Vaccines- Shingrix (1 of 2) Never done   PNA vac Low Risk Adult (1 of 2 - PCV13) Never done   COLONOSCOPY (Pts 45-450yrInsurance coverage will need to be confirmed)  03/25/2019   MAMMOGRAM  04/21/2019   TETANUS/TDAP  02/22/2020    Colorectal cancer screening: Referral to GI placed patient declines referral colonoscopy . Pt aware the office will call re: appt. Mammogram status: Ordered 09/09/2020. Pt provided with contact info and advised to call to schedule appt.   Bone Density status: Completed 04/20/2017. Results reflect: Bone density results: NORMAL. Repeat every 5 years.  Lung Cancer Screening: (Low Dose CT Chest recommended if Age 72-80ears, 30 pack-year currently smoking OR have quit w/in 15years.) does not qualify.   Lung Cancer Screening Referral: n/a  Additional  Screening:  Hepatitis C Screening: does not qualify  Vision Screening: Recommended annual ophthalmology exams for early detection of glaucoma and other disorders of the eye. Is the patient up to date with their annual eye exam?  No  Who is the provider or what is the name of the office in which the patient attends annual eye exams? Referral completed 09/09/2020 If pt is not established with a provider, would they like to be referred to a provider to establish care? Yes .   Dental Screening: Recommended annual dental exams for proper oral hygiene  Community Resource Referral / Chronic Care Management: CRR required this visit?  No   CCM required this visit?  No      Plan:     I have personally reviewed and noted the following in the patient's chart:   Medical and social history Use of alcohol, tobacco or illicit drugs  Current medications and supplements including opioid prescriptions. Patient is not currently taking opioid prescriptions. Functional ability and status Nutritional status Physical activity Advanced directives List of other physicians Hospitalizations, surgeries, and ER visits in previous 12 months Vitals Screenings to include cognitive, depression, and falls Referrals and appointments  In addition, I have reviewed and discussed with patient certain preventive protocols, quality metrics, and best practice recommendations. A written personalized care plan for preventive services as well as general preventive health recommendations were provided to patient.     LaRandel PiggLPN   08/21/93/0932 Nurse Notes: none

## 2020-09-09 NOTE — Patient Instructions (Signed)
Allison Short , Thank you for taking time to come for your Medicare Wellness Visit. I appreciate your ongoing commitment to your health goals. Please review the following plan we discussed and let me know if I can assist you in the future.   Screening recommendations/referrals: Colonoscopy: patient declines  Mammogram: referral completed 09/09/2020 Bone Density: 04/20/2017 Recommended yearly ophthalmology/optometry visit for glaucoma screening and checkup Recommended yearly dental visit for hygiene and checkup  Vaccinations: Influenza vaccine: due in fall 2022  Pneumococcal vaccine: declined  Tdap vaccine: due declined  Shingles vaccine: declined   Advanced directives: will pick up Living Will packet   Conditions/risks identified: none   Next appointment: none    Preventive Care 29 Years and Older, Female Preventive care refers to lifestyle choices and visits with your health care provider that can promote health and wellness. What does preventive care include? A yearly physical exam. This is also called an annual well check. Dental exams once or twice a year. Routine eye exams. Ask your health care provider how often you should have your eyes checked. Personal lifestyle choices, including: Daily care of your teeth and gums. Regular physical activity. Eating a healthy diet. Avoiding tobacco and drug use. Limiting alcohol use. Practicing safe sex. Taking low-dose aspirin every day. Taking vitamin and mineral supplements as recommended by your health care provider. What happens during an annual well check? The services and screenings done by your health care provider during your annual well check will depend on your age, overall health, lifestyle risk factors, and family history of disease. Counseling  Your health care provider may ask you questions about your: Alcohol use. Tobacco use. Drug use. Emotional well-being. Home and relationship well-being. Sexual activity. Eating  habits. History of falls. Memory and ability to understand (cognition). Work and work Statistician. Reproductive health. Screening  You may have the following tests or measurements: Height, weight, and BMI. Blood pressure. Lipid and cholesterol levels. These may be checked every 5 years, or more frequently if you are over 6 years old. Skin check. Lung cancer screening. You may have this screening every year starting at age 85 if you have a 30-pack-year history of smoking and currently smoke or have quit within the past 15 years. Fecal occult blood test (FOBT) of the stool. You may have this test every year starting at age 43. Flexible sigmoidoscopy or colonoscopy. You may have a sigmoidoscopy every 5 years or a colonoscopy every 10 years starting at age 23. Hepatitis C blood test. Hepatitis B blood test. Sexually transmitted disease (STD) testing. Diabetes screening. This is done by checking your blood sugar (glucose) after you have not eaten for a while (fasting). You may have this done every 1-3 years. Bone density scan. This is done to screen for osteoporosis. You may have this done starting at age 30. Mammogram. This may be done every 1-2 years. Talk to your health care provider about how often you should have regular mammograms. Talk with your health care provider about your test results, treatment options, and if necessary, the need for more tests. Vaccines  Your health care provider may recommend certain vaccines, such as: Influenza vaccine. This is recommended every year. Tetanus, diphtheria, and acellular pertussis (Tdap, Td) vaccine. You may need a Td booster every 10 years. Zoster vaccine. You may need this after age 47. Pneumococcal 13-valent conjugate (PCV13) vaccine. One dose is recommended after age 23. Pneumococcal polysaccharide (PPSV23) vaccine. One dose is recommended after age 6. Talk to your health care  provider about which screenings and vaccines you need and how  often you need them. This information is not intended to replace advice given to you by your health care provider. Make sure you discuss any questions you have with your health care provider. Document Released: 03/06/2015 Document Revised: 10/28/2015 Document Reviewed: 12/09/2014 Elsevier Interactive Patient Education  2017 [place] Prevention in the Home Falls can cause injuries. They can happen to people of all ages. There are many things you can do to make your home safe and to help prevent falls. What can I do on the outside of my home? Regularly fix the edges of walkways and driveways and fix any cracks. Remove anything that might make you trip as you walk through a door, such as a raised step or threshold. Trim any bushes or trees on the path to your home. Use bright outdoor lighting. Clear any walking paths of anything that might make someone trip, such as rocks or tools. Regularly check to see if handrails are loose or broken. Make sure that both sides of any steps have handrails. Any raised decks and porches should have guardrails on the edges. Have any leaves, snow, or ice cleared regularly. Use sand or salt on walking paths during winter. Clean up any spills in your garage right away. This includes oil or grease spills. What can I do in the bathroom? Use night lights. Install grab bars by the toilet and in the tub and shower. Do not use towel bars as grab bars. Use non-skid mats or decals in the tub or shower. If you need to sit down in the shower, use a plastic, non-slip stool. Keep the floor dry. Clean up any water that spills on the floor as soon as it happens. Remove soap buildup in the tub or shower regularly. Attach bath mats securely with double-sided non-slip rug tape. Do not have throw rugs and other things on the floor that can make you trip. What can I do in the bedroom? Use night lights. Make sure that you have a light by your bed that is easy to  reach. Do not use any sheets or blankets that are too big for your bed. They should not hang down onto the floor. Have a firm chair that has side arms. You can use this for support while you get dressed. Do not have throw rugs and other things on the floor that can make you trip. What can I do in the kitchen? Clean up any spills right away. Avoid walking on wet floors. Keep items that you use a lot in easy-to-reach places. If you need to reach something above you, use a strong step stool that has a grab bar. Keep electrical cords out of the way. Do not use floor polish or wax that makes floors slippery. If you must use wax, use non-skid floor wax. Do not have throw rugs and other things on the floor that can make you trip. What can I do with my stairs? Do not leave any items on the stairs. Make sure that there are handrails on both sides of the stairs and use them. Fix handrails that are broken or loose. Make sure that handrails are as long as the stairways. Check any carpeting to make sure that it is firmly attached to the stairs. Fix any carpet that is loose or worn. Avoid having throw rugs at the top or bottom of the stairs. If you do have throw rugs, attach them to the  floor with carpet tape. Make sure that you have a light switch at the top of the stairs and the bottom of the stairs. If you do not have them, ask someone to add them for you. What else can I do to help prevent falls? Wear shoes that: Do not have high heels. Have rubber bottoms. Are comfortable and fit you well. Are closed at the toe. Do not wear sandals. If you use a stepladder: Make sure that it is fully opened. Do not climb a closed stepladder. Make sure that both sides of the stepladder are locked into place. Ask someone to hold it for you, if possible. Clearly mark and make sure that you can see: Any grab bars or handrails. First and last steps. Where the edge of each step is. Use tools that help you move  around (mobility aids) if they are needed. These include: Canes. Walkers. Scooters. Crutches. Turn on the lights when you go into a dark area. Replace any light bulbs as soon as they burn out. Set up your furniture so you have a clear path. Avoid moving your furniture around. If any of your floors are uneven, fix them. If there are any pets around you, be aware of where they are. Review your medicines with your doctor. Some medicines can make you feel dizzy. This can increase your chance of falling. Ask your doctor what other things that you can do to help prevent falls. This information is not intended to replace advice given to you by your health care provider. Make sure you discuss any questions you have with your health care provider. Document Released: 12/04/2008 Document Revised: 07/16/2015 Document Reviewed: 03/14/2014 Elsevier Interactive Patient Education  2017 [employer].

## 2020-09-10 ENCOUNTER — Other Ambulatory Visit: Payer: Self-pay | Admitting: Adult Health

## 2020-09-10 ENCOUNTER — Encounter: Payer: Self-pay | Admitting: Adult Health

## 2020-09-10 MED ORDER — HYDROCODONE-ACETAMINOPHEN 10-325 MG PO TABS: 1.0000 | ORAL_TABLET | Freq: Four times a day (QID) | ORAL | 0 refills | Status: DC | PRN

## 2020-09-15 ENCOUNTER — Encounter: Payer: Self-pay | Admitting: Adult Health

## 2020-09-18 ENCOUNTER — Telehealth: Payer: Self-pay | Admitting: Adult Health

## 2020-09-18 NOTE — Telephone Encounter (Signed)
[place], OT w/[facility] is calling to let the provider know that the pt is going to be discharged today from OT and will continue nursing and PT.  If you should have any question feel free to contact him on the number provided.

## 2020-09-22 NOTE — Telephone Encounter (Signed)
FYI

## 2020-09-23 ENCOUNTER — Encounter: Payer: Self-pay | Admitting: Adult Health

## 2020-09-23 NOTE — Telephone Encounter (Signed)
Please advise 

## 2020-09-24 ENCOUNTER — Telehealth: Payer: Self-pay | Admitting: Adult Health

## 2020-09-24 NOTE — Telephone Encounter (Signed)
Allison Short from [facility], [phone], called to request verbal orders for the calamine unna boot as the PT really like them when they tested it out. They would like the orders for the calamine unna boot for both legs twice a week. If any questions she can be called at the # provided and a VM can be left if no answer.

## 2020-09-25 ENCOUNTER — Encounter: Payer: Self-pay | Admitting: Adult Health

## 2020-09-25 NOTE — Telephone Encounter (Signed)
Allison Short has been given verbal orders.

## 2020-09-29 ENCOUNTER — Other Ambulatory Visit: Payer: Self-pay | Admitting: Adult Health

## 2020-09-29 DIAGNOSIS — I890 Lymphedema, not elsewhere classified: Secondary | ICD-10-CM

## 2020-10-02 ENCOUNTER — Telehealth: Payer: Self-pay | Admitting: Adult Health

## 2020-10-02 NOTE — Telephone Encounter (Signed)
   Allison Short DOB: [birth year] MRN: [id number]   RIDER WAIVER AND RELEASE OF LIABILITY  For purposes of improving physical access to our facilities, [facility] is pleased to partner with third parties to provide [place] patients or other authorized individuals the option of convenient, on-demand ground transportation services (the [place]") through use of the technology service that enables users to request on-demand ground transportation from independent third-party providers.  By opting to use and accept these [employer], I, the undersigned, hereby agree on behalf of myself, and on behalf of any minor child using the Government social research officer for whom I am the parent or legal guardian, as follows:  Government social research officer provided to me are provided by independent third-party transportation providers who are not [employer] or employees and who are unaffiliated with [employer]. [facility] is neither a transportation carrier nor a common or public carrier. [facility] has no control over the quality or safety of the transportation that occurs as a result of the [employer]. [facility] cannot guarantee that any third-party transportation provider will complete any arranged transportation service. [facility] makes no representation, warranty, or guarantee regarding the reliability, timeliness, quality, safety, suitability, or availability of any of the Transport Services or that they will be error free. I fully understand that traveling by vehicle involves risks and dangers of serious bodily injury, including permanent disability, paralysis, and death. I agree, on behalf of myself and on behalf of any minor child using the Transport Services for whom I am the parent or legal guardian, that the entire risk arising out of my use of the [employer] remains solely with me, to the maximum extent permitted under applicable law. The [employer] are provided "as is"  and "as available." [facility] disclaims all representations and warranties, express, implied or statutory, not expressly set out in these terms, including the implied warranties of merchantability and fitness for a particular purpose. I hereby waive and release [facility], its agents, employees, officers, directors, representatives, insurers, attorneys, assigns, successors, subsidiaries, and affiliates from any and all past, present, or future claims, demands, liabilities, actions, causes of action, or suits of any kind directly or indirectly arising from acceptance and use of the [employer]. I further waive and release [facility] and its affiliates from all present and future liability and responsibility for any injury or death to persons or damages to property caused by or related to the use of the [employer]. I have read this Waiver and Release of Liability, and I understand the terms used in it and their legal significance. This Waiver is freely and voluntarily given with the understanding that my right (as well as the right of any minor child for whom I am the parent or legal guardian using the [employer]) to legal recourse against [facility] in connection with the [employer] is knowingly surrendered in return for use of these services.   I attest that I read the consent document to Allison Short, gave Ms. Mole the opportunity to ask questions and answered the questions asked (if any). I affirm that Allison Short then provided consent for she's participation in this program.     Allison Short

## 2020-10-22 DIAGNOSIS — L97211 Non-pressure chronic ulcer of right calf limited to breakdown of skin: Secondary | ICD-10-CM | POA: Diagnosis not present

## 2020-11-04 ENCOUNTER — Ambulatory Visit (HOSPITAL_COMMUNITY): Payer: Medicare HMO | Attending: Adult Health | Admitting: Physical Therapy

## 2020-11-04 ENCOUNTER — Other Ambulatory Visit: Payer: Self-pay

## 2020-11-04 DIAGNOSIS — I890 Lymphedema, not elsewhere classified: Secondary | ICD-10-CM | POA: Diagnosis not present

## 2020-11-04 NOTE — Therapy (Signed)
[facility] [address] [place], [place], [address] Phone: [phone]   Fax:  [phone]  Physical Therapy Evaluation  Patient Details  Name: Allison Short MRN: [id number] Date of Birth: 1950-02-29 Referring Provider (PT): Sallee Provencal   Encounter Date: 11/04/2020   PT End of Session - 11/04/20 1400     Visit Number 1    Number of Visits 18    Date for PT Re-Evaluation 12/16/20    Authorization Type [employer] medicare- authorization requested    Progress Note Due on Visit 10    PT Start Time 1400    PT Stop Time 1515    PT Time Calculation (min) 75 min    Activity Tolerance Patient tolerated treatment well    Behavior During Therapy [facility] for tasks assessed/performed             Past Medical History:  Diagnosis Date   History of blood transfusion    2016   History of urinary tract infection    Hypertension    Non Hodgkin's lymphoma ([place]) 2008   Obesity    Osteopenia 04/2017   T score -1.4 FRAX 7.5% / 0.8%   Pancreatitis     Past Surgical History:  Procedure Laterality Date   BIOPSY THYROID     BONE MARROW BIOPSY     CESAREAN SECTION  10/26/1975   CHOLECYSTECTOMY     2006   PORTA CATH INSERTION     TONSILLECTOMY AND ADENOIDECTOMY     1952   TOTAL KNEE ARTHROPLASTY Bilateral    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    Subjective Assessment - 11/04/20 1404     Subjective Pt states that she had B TKR on August 2016, she had the knee replacements completed one day apart.  The LT went well but the RT got infected.  She had to return to the hospital for IV antibiotic and a wound vac.  She Went to a SNF and was on the wound vac for 8 months.  Her legs began weeping in February, this increased, they had home health nursing come out but her leg got worse and she was admitted into the hospital on 08/20/2020 thru 08/26/2020.  She then had [facility] coming out to her house.  Her legs are much improved but she  still can not lift her leg onto her bed she uses a strap. [facility] discharged her on Monday.    Pertinent History B TKR, HTN, non hodgkins lymphoma    How long can you sit comfortably? sitting    How long can you walk comfortably? PT amublates with a rollator unable to ambulate greater than 70 ft without a quick rest.    Patient Stated Goals make it go away    Currently in Pain? No/denies                [facility] PT Assessment - 11/04/20 0001       Assessment   Medical Diagnosis B LE lymphedema    Referring Provider (PT) Sallee Provencal    Prior Therapy none      Precautions   Precautions --   cellulitis     Restrictions   Weight Bearing Restrictions No      Balance Screen   Has the patient fallen in the past 6 months No    Has the patient had a decrease in activity level because of a fear of falling?  No    Is the patient reluctant to leave their home because of a fear of falling?  No      Prior Function   Level of Independence Independent with basic ADLs      Cognition   Overall Cognitive Status Within Functional Limits for tasks assessed               LYMPHEDEMA/ONCOLOGY QUESTIONNAIRE - 11/04/20 0001       What other symptoms do you have   Are you Having Heaviness or Tightness Yes    Are you having Pain No    Are you having pitting edema Yes    Body Site legs    Is it Hard or Difficult finding clothes that fit Yes    Do you have infections No      Lymphedema Stage   Stage STAGE 2 SPONTANEOUSLY IRREVERSIBLE      Lymphedema Assessments   Lymphedema Assessments Lower extremities      Right Lower Extremity Lymphedema   20 cm Proximal to Suprapatella 89 cm    10 cm Proximal to Suprapatella 90.5 cm    At Midpatella/Popliteal Crease 73.5 cm    30 cm Proximal to Floor at Lateral Plantar Foot 73 cm    20 cm Proximal to Floor at Lateral Plantar Foot 62._0 cm Proximal to Floor at Lateral M[place]oli 40.5 cm    Circumference of ankle/heel 38.5 cm.    5 cm  Proximal to 1st MTP Joint 29 cm    Across MTP Joint 28 cm      Left Lower Extremity Lymphedema   20 cm Proximal to Suprapatella 89.2 cm    10 cm Proximal to Suprapatella 88.9 cm    At Midpatella/Popliteal Crease 72.8 cm    30 cm Proximal to Floor at Lateral Plantar Foot 64.1 cm    20 cm Proximal to Floor at Lateral Plantar Foot 50.9 cm    10 cm Proximal to Floor at Lateral [place] 39.2 cm    Circumference of ankle/heel 37.7 cm.    5 cm Proximal to 1st MTP Joint 27 cm    Across MTP Joint 27.2 cm    Around Proximal Great Toe 9.5 cm                     Objective measurements completed on examination: See above findings.       [facility] Adult PT Treatment/Exercise - 11/04/20 0001       Exercises   Exercises --   ankle pumps, LAQ, ab/adduction, marching, diaphragmic breathing and lymph squeeze.     Manual Therapy   Manual Therapy Manual Lymphatic Drainage (MLD);Compression Bandaging    Manual therapy comments done seperate from all other aspects of treatment    Compression Bandaging foam cut and under cabinet.                     PT Education - 11/04/20 1359     Education Details lymphedema what the condition is, how it is managed, LE exerices to improve lymph circulation    Person(s) Educated Patient    Methods Explanation    Comprehension Verbalized understanding              PT Short Term Goals - 11/04/20 1531       PT SHORT TERM GOAL #1   Title Pt to be I in HEP to improve lymphatic circulation    Time 2  Period Weeks    Status New    Target Date 11/18/20      PT SHORT TERM GOAL #2   Title PT to have lost 4 cm in measurement to allow pt to be able to lift her leg and put it on the bed    Time 3    Period Weeks    Status New    Target Date 11/25/20               PT Long Term Goals - 11/04/20 1532       PT LONG TERM GOAL #1   Title PT to have and be using a compression pump    Time 6    Status New    Target Date 12/16/20       PT LONG TERM GOAL #2   Title PT to have lost 5-8 cm from measurement of thigh and LE not foot to reduce risk of cellulitis and make it easier to don compression garment    Time 6    Period Weeks    Status New      PT LONG TERM GOAL #3   Title PT to have and be donning a compression garment.    Time 6    Period Weeks    Status New                    Plan - 11/04/20 1522     Clinical Impression Statement Ms. Rota is a 70 yo female who has had lymphedema for years.  She had weeping of her legs B in February and by June she had cellulitis and needed to be hospitalized for  IV antibiotics.  Home health has been coming out and placing unna boots on her which seem to reduce her girth but then it goes right back.  Therefore, her MD has referred her for lymphedema treatments.  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    Ms. Croll will benefit from skilled PT to address these issues and maximize her functional ability    Personal Factors and Comorbidities Fitness;Time since onset of injury/illness/exacerbation;Comorbidity 2    Comorbidities obesity, OA    Examination-Activity Limitations Bathing;Bed Mobility;Carry;Dressing;Lift;Locomotion Level;Squat;Stairs;Stand;Transfers;Toileting    Examination-Participation Restrictions Church;Cleaning;Community Activity;Driving;Laundry;Shop;Yard Work    Merchant navy officer Evolving/Moderate complexity    Clinical Decision Making Moderate    Rehab Potential Good    PT Frequency 3x / week    PT Duration 6 weeks    PT Treatment/Interventions Therapeutic activities;Therapeutic exercise;Patient/family education;Manual techniques;Manual lymph drainage;Compression bandaging    PT Next Visit Plan complete decongestive techniquees with bandaging to the knee level.  We will begin thigh bandaging next week .  Measure on Wed.    PT Home Exercise Plan ankle  pumps, LAQ, ab/adduction, marching, diaphragmic breathing and lymph squeeze.             Patient will benefit from skilled therapeutic intervention in order to improve the following deficits and impairments:  Abnormal gait, Cardiopulmonary status limiting activity, Decreased activity tolerance, Decreased balance, Decreased mobility, Decreased range of motion, Difficulty walking, Decreased strength, Decreased skin integrity, Increased edema, Obesity  Visit Diagnosis: Lymphedema, not elsewhere classified     Problem List Patient Active Problem List   Diagnosis Date Noted   Cellulitis 08/20/2020   Morbid obesity ([place]) 08/20/2020   Hypokalemia 08/20/2020   Cellulitis of right lower extremity 08/20/2020   AKI (acute kidney  injury) ([place]) 08/20/2020   Hypertension    Essential hypertension 07/08/2015   Non Hodgkin's lymphoma [facility]) 02/21/2006   Rayetta Humphrey, PT CLT [phone]  11/04/2020, 3:35 PM  [facility] [address] [place], [place], [address] Phone: [phone]   Fax:  [phone]  Name: Cambelle Suchecki MRN: [id number] Date of Birth: 1950-11-24

## 2020-11-06 ENCOUNTER — Ambulatory Visit (HOSPITAL_COMMUNITY): Payer: Medicare HMO

## 2020-11-06 ENCOUNTER — Other Ambulatory Visit: Payer: Self-pay

## 2020-11-06 ENCOUNTER — Encounter (HOSPITAL_COMMUNITY): Payer: Self-pay

## 2020-11-06 DIAGNOSIS — I890 Lymphedema, not elsewhere classified: Secondary | ICD-10-CM | POA: Diagnosis not present

## 2020-11-06 NOTE — Therapy (Signed)
[facility] [address] [place], [place], [address] Phone: [phone]   Fax:  [phone]  Physical Therapy Treatment  Patient Details  Name: Allison Short MRN: [id number] Date of Birth: 1950/03/05 Referring Provider (PT): Sallee Provencal   Encounter Date: 11/06/2020   PT End of Session - 11/06/20 1045     Visit Number 2    Number of Visits 18    Date for PT Re-Evaluation 12/16/20    Authorization Type [employer] medicare- authorization requested    Progress Note Due on Visit 10    PT Start Time 5465    PT Stop Time 1220    PT Time Calculation (min) 97 min    Activity Tolerance Patient tolerated treatment well    Behavior During Therapy [facility] for tasks assessed/performed             Past Medical History:  Diagnosis Date   History of blood transfusion    2016   History of urinary tract infection    Hypertension    Non Hodgkin's lymphoma ([place]) 2008   Obesity    Osteopenia 04/2017   T score -1.4 FRAX 7.5% / 0.8%   Pancreatitis     Past Surgical History:  Procedure Laterality Date   BIOPSY THYROID     BONE MARROW BIOPSY     CESAREAN SECTION  10/26/1975   CHOLECYSTECTOMY     2006   PORTA CATH INSERTION     TONSILLECTOMY AND ADENOIDECTOMY     1952   TOTAL KNEE ARTHROPLASTY Bilateral    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   Subjective Assessment - 11/06/20 1043     Subjective Pt stated she is feeling a little better today compared to last session.  Reports she has some pain Rt LE when walking, increases to 5/10.  She has some questions regarding lymphedema treatment.    Pertinent History B TKR, HTN, non hodgkins lymphoma    Patient Stated Goals make it go away    Currently in Pain? Yes    Pain Score 5     Pain Location Leg    Pain Orientation Right    Pain Descriptors / Indicators Tightness   stiffness   Pain Type Chronic pain    Pain Onset More than a month ago    Pain Frequency  Intermittent    Aggravating Factors  weight bearing    Pain Relieving Factors rest    Effect of Pain on Daily Activities limits                               [facility] Adult PT Treatment/Exercise - 11/06/20 0001       Manual Therapy   Manual Therapy Myofascial release;Compression Bandaging;Other (comment)    Manual therapy comments Manual complete separate than rest of tx    Myofascial Release Decongestive lymphedema techniques  including short neck, deep and superficial abdominal, inguinal to axillary BLE anterior only    Compression Bandaging Multilayer short stretch bandages with 1/2in foam    Other Manual Therapy Foam cut for thigh                       PT Short Term Goals - 11/04/20 1531       PT SHORT TERM GOAL #1   Title Pt to be I in HEP to improve lymphatic  circulation    Time 2    Period Weeks    Status New    Target Date 11/18/20      PT SHORT TERM GOAL #2   Title PT to have lost 4 cm in measurement to allow pt to be able to lift her leg and put it on the bed    Time 3    Period Weeks    Status New    Target Date 11/25/20               PT Long Term Goals - 11/04/20 1532       PT LONG TERM GOAL #1   Title PT to have and be using a compression pump    Time 6    Status New    Target Date 12/16/20      PT LONG TERM GOAL #2   Title PT to have lost 5-8 cm from measurement of thigh and LE not foot to reduce risk of cellulitis and make it easier to don compression garment    Time 6    Period Weeks    Status New      PT LONG TERM GOAL #3   Title PT to have and be donning a compression garment.    Time 6    Period Weeks    Status New                   Plan - 11/06/20 1248     Clinical Impression Statement Reviewed goals.  Educated 4 components with lymphedema including: skin care, exercises, manual and compression bandages.  Educated on proper wear time with compression bandages and to re-roll prior next  sessoin.  Foam cut for thighs, under cabinet in lymph room.  Manual decongestive techniques complete anterior only due to time restraints.  Application of multilayer short stretch bandages with 1/2in foam to knees included toe wrap.  Pillow places under knees while sitting on EOB with improve comfort while sitting.    Personal Factors and Comorbidities Fitness;Time since onset of injury/illness/exacerbation;Comorbidity 2    Comorbidities obesity, OA    Examination-Activity Limitations Bathing;Bed Mobility;Carry;Dressing;Lift;Locomotion Level;Squat;Stairs;Stand;Transfers;Toileting    Examination-Participation Restrictions Church;Cleaning;Community Activity;Driving;Laundry;Shop;Yard Work    [employer]chant [employer] officer Evolving/Moderate complexity    Clinical Decision Making Moderate    Rehab Potential Good    PT Frequency 3x / week    PT Duration 6 weeks    PT Treatment/Interventions Therapeutic activities;Therapeutic exercise;Patient/family education;Manual techniques;Manual lymph drainage;Compression bandaging    PT Next Visit Plan complete decongestive techniquees with bandaging to the knee level.  We will begin thigh bandaging next week (foam cut under counter in lymph room) .  Measure on Wed.    PT Home Exercise Plan ankle pumps, LAQ, ab/adduction, marching, diaphragmic breathing and lymph squeeze.    Consulted and Agree with Plan of Care Patient             Patient will benefit from skilled therapeutic intervention in order to improve the following deficits and impairments:  Abnormal gait, Cardiopulmonary status limiting activity, Decreased activity tolerance, Decreased balance, Decreased mobility, Decreased range of motion, Difficulty walking, Decreased strength, Decreased skin integrity, Increased edema, Obesity  Visit Diagnosis: Lymphedema, not elsewhere classified     Problem List Patient Active Problem List   Diagnosis Date Noted   Cellulitis 08/20/2020   Morbid  obesity ([place]) 08/20/2020   Hypokalemia 08/20/2020   Cellulitis of right lower extremity 08/20/2020   AKI (acute kidney injury) ([place]) 08/20/2020  Hypertension    Essential hypertension 07/08/2015   Non Hodgkin's lymphoma ([place]) 02/21/2006   Ihor Austin, LPTA/CLT; CBIS [phone]  Aldona Lento, PTA 11/06/2020, 1:01 PM  [facility] [address] [place], [place], [address] Phone: [phone]   Fax:  [phone]  Name: Allison Short MRN: [id number] Date of Birth: 12-Apr-1950

## 2020-11-09 ENCOUNTER — Ambulatory Visit (HOSPITAL_COMMUNITY): Payer: Medicare HMO | Admitting: Physical Therapy

## 2020-11-09 ENCOUNTER — Other Ambulatory Visit: Payer: Self-pay

## 2020-11-09 DIAGNOSIS — I890 Lymphedema, not elsewhere classified: Secondary | ICD-10-CM

## 2020-11-09 NOTE — Therapy (Signed)
[facility] [address] [place], [place], [address] Phone: [phone]   Fax:  [phone]  Physical Therapy Treatment  Patient Details  Name: Allison Short MRN: [id number] Date of Birth: May 13, 1950 Referring Provider (PT): Sallee Provencal   Encounter Date: 11/09/2020   PT End of Session - 11/09/20 1351     Visit Number 3    Number of Visits 18    Date for PT Re-Evaluation 12/16/20    Authorization Type [employer] medicare- authorization requested    Progress Note Due on Visit 10    PT Start Time 7782    PT Stop Time 1215    PT Time Calculation (min) 84 min    Activity Tolerance Patient tolerated treatment well    Behavior During Therapy [facility] for tasks assessed/performed             Past Medical History:  Diagnosis Date   History of blood transfusion    2016   History of urinary tract infection    Hypertension    Non Hodgkin's lymphoma ([place]) 2008   Obesity    Osteopenia 04/2017   T score -1.4 FRAX 7.5% / 0.8%   Pancreatitis     Past Surgical History:  Procedure Laterality Date   BIOPSY THYROID     BONE MARROW BIOPSY     CESAREAN SECTION  10/26/1975   CHOLECYSTECTOMY     2006   PORTA CATH INSERTION     TONSILLECTOMY AND ADENOIDECTOMY     1952   TOTAL KNEE ARTHROPLASTY Bilateral    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   Subjective Assessment - 11/09/20 1348     Subjective PT states her toe bandages fell off and the rest began sliding down shortly after getting home.  STates she rewrapped it the best she could and was able to keep it on until last night.  STates she can tell it helped with her lower legs but it was so hard to keep them on.  Sttates she doesn't think she could tolerate them on her upper thighs.    Currently in Pain? Yes    Pain Score 5     Pain Location Leg    Pain Orientation Right    Pain Descriptors / Indicators Aching;Tightness                                [facility] Adult PT Treatment/Exercise - 11/09/20 0001       Manual Therapy   Manual Therapy Manual Lymphatic Drainage (MLD);Compression Bandaging    Manual therapy comments Manual complete separate than rest of tx    Myofascial Release Decongestive lymphedema techniques  including short neck, deep and superficial abdominal, inguinal to axillary BLE anterior only    Compression Bandaging Multilayer short stretch bandages with 1/2in foam                       PT Short Term Goals - 11/04/20 1531       PT SHORT TERM GOAL #1   Title Pt to be I in HEP to improve lymphatic circulation    Time 2    Period Weeks    Status New    Target Date 11/18/20      PT SHORT TERM GOAL #2   Title PT to have lost 4 cm in measurement to allow  pt to be able to lift her leg and put it on the bed    Time 3    Period Weeks    Status New    Target Date 11/25/20               PT Long Term Goals - 11/04/20 1532       PT LONG TERM GOAL #1   Title PT to have and be using a compression pump    Time 6    Status New    Target Date 12/16/20      PT LONG TERM GOAL #2   Title PT to have lost 5-8 cm from measurement of thigh and LE not foot to reduce risk of cellulitis and make it easier to don compression garment    Time 6    Period Weeks    Status New      PT LONG TERM GOAL #3   Title PT to have and be donning a compression garment.    Time 6    Period Weeks    Status New                   Plan - 11/09/20 1530     Clinical Impression Statement Pt overall pleased with bandaging, however had difficulty keeping them up.  Continue with manula lymph drainage and application of lotion pt brought from home (aquaphor). Did not initiate bandaging up thight this session per request, however disscussed adding one leg at a time beginning with Rt next session.  Pt reported comfort with bandages applied this session.    Personal Factors and  Comorbidities Fitness;Time since onset of injury/illness/exacerbation;Comorbidity 2    Comorbidities obesity, OA    Examination-Activity Limitations Bathing;Bed Mobility;Carry;Dressing;Lift;Locomotion Level;Squat;Stairs;Stand;Transfers;Toileting    Examination-Participation Restrictions Church;Cleaning;Community Activity;Driving;Laundry;Shop;Yard Work    [employer]chant [employer] officer Evolving/Moderate complexity    Rehab Potential Good    PT Frequency 3x / week    PT Duration 6 weeks    PT Treatment/Interventions Therapeutic activities;Therapeutic exercise;Patient/family education;Manual techniques;Manual lymph drainage;Compression bandaging    PT Next Visit Plan complete decongestive techniquees with bandaging to the knee level.  We will begin thigh bandaging next visit to Rt first then Lt  (foam cut under counter in lymph room) .  Measure on Wed.    PT Home Exercise Plan ankle pumps, LAQ, ab/adduction, marching, diaphragmic breathing and lymph squeeze.    Consulted and Agree with Plan of Care Patient             Patient will benefit from skilled therapeutic intervention in order to improve the following deficits and impairments:  Abnormal gait, Cardiopulmonary status limiting activity, Decreased activity tolerance, Decreased balance, Decreased mobility, Decreased range of motion, Difficulty walking, Decreased strength, Decreased skin integrity, Increased edema, Obesity  Visit Diagnosis: Lymphedema, not elsewhere classified     Problem List Patient Active Problem List   Diagnosis Date Noted   Cellulitis 08/20/2020   Morbid obesity ([place]) 08/20/2020   Hypokalemia 08/20/2020   Cellulitis of right lower extremity 08/20/2020   AKI (acute kidney injury) ([place]) 08/20/2020   Hypertension    Essential hypertension 07/08/2015   Non Hodgkin's lymphoma ([place]) 02/21/2006   Teena Irani, PTA/CLT [phone]  Teena Irani, PTA 11/09/2020, 3:33 PM  [facility] [address] [place], [place], [address] Phone: [phone]   Fax:  [phone]  Name: Allison Short MRN: [id number] Date of Birth: 1950/08/13

## 2020-11-11 ENCOUNTER — Encounter (HOSPITAL_COMMUNITY): Payer: Medicare HMO

## 2020-11-11 ENCOUNTER — Telehealth (HOSPITAL_COMMUNITY): Payer: Self-pay

## 2020-11-11 NOTE — Telephone Encounter (Signed)
Originally though no show, called and spoke to pt who stated she called to cancel apt today at 7:30am.  Reports she had diarrhea last night.  Reminded next apt date and time.  Ihor Austin, LPTA/CLT; Delana Meyer [phone]

## 2020-11-13 ENCOUNTER — Encounter (HOSPITAL_COMMUNITY): Payer: Medicare HMO

## 2020-11-16 ENCOUNTER — Ambulatory Visit (HOSPITAL_COMMUNITY): Payer: Medicare HMO | Admitting: Physical Therapy

## 2020-11-16 ENCOUNTER — Other Ambulatory Visit: Payer: Self-pay

## 2020-11-16 DIAGNOSIS — I890 Lymphedema, not elsewhere classified: Secondary | ICD-10-CM

## 2020-11-16 NOTE — Therapy (Signed)
[facility] [address] [place], [place], [address] Phone: [phone]   Fax:  [phone]  Physical Therapy Treatment  Patient Details  Name: Allison Short MRN: [id number] Date of Birth: Nov 21, 1950 Referring Provider (PT): Sallee Provencal   Encounter Date: 11/16/2020   PT End of Session - 11/16/20 1235     Visit Number 4    Number of Visits 18    Date for PT Re-Evaluation 12/16/20    Authorization Type [employer] medicare- authorization requested    Progress Note Due on Visit 10    PT Start Time 5809    PT Stop Time 1225    PT Time Calculation (min) 93 min    Activity Tolerance Patient tolerated treatment well    Behavior During Therapy [facility] for tasks assessed/performed             Past Medical History:  Diagnosis Date   History of blood transfusion    2016   History of urinary tract infection    Hypertension    Non Hodgkin's lymphoma ([place]) 2008   Obesity    Osteopenia 04/2017   T score -1.4 FRAX 7.5% / 0.8%   Pancreatitis     Past Surgical History:  Procedure Laterality Date   BIOPSY THYROID     BONE MARROW BIOPSY     CESAREAN SECTION  10/26/1975   CHOLECYSTECTOMY     2006   PORTA CATH INSERTION     TONSILLECTOMY AND ADENOIDECTOMY     1952   TOTAL KNEE ARTHROPLASTY Bilateral    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   Subjective Assessment - 11/16/20 1231     Subjective PT states she missed the past two visits due to GI issues.    Pertinent History B TKR, HTN, non hodgkins lymphoma    How long can you sit comfortably? sitting    How long can you walk comfortably? PT amublates with a rollator unable to ambulate greater than 70 ft without a quick rest.    Patient Stated Goals make it go away    Currently in Pain? Yes    Pain Score 4     Pain Location Leg    Pain Orientation Right    Pain Descriptors / Indicators Aching;Tightness    Pain Type Chronic pain    Pain Onset More than a month  ago    Pain Frequency Intermittent    Aggravating Factors  WB    Pain Relieving Factors rest    Effect of Pain on Daily Activities limits                               [facility] Adult PT Treatment/Exercise - 11/16/20 0001       Manual Therapy   Manual Therapy Manual Lymphatic Drainage (MLD);Compression Bandaging    Manual therapy comments Manual complete separate than rest of tx    Manual Lymphatic Drainage (MLD) decongestive techniques including supraclavicular, deep and superfical abdominal inguinal/axillary anastomosis and LE.  Manual completed B anterior only    Compression Bandaging ffrom toe to thigh B using 1/2" foam and multilayer short stretch bandaging.    Other Manual Therapy foam cut for LT thigh and recut for posterior calf                       PT Short Term Goals -  11/16/20 1238       PT SHORT TERM GOAL #1   Title Pt to be I in HEP to improve lymphatic circulation    Time 2    Period Weeks    Status On-going    Target Date 11/18/20      PT SHORT TERM GOAL #2   Title PT to have lost 4 cm in measurement to allow pt to be able to lift her leg and put it on the bed    Time 3    Period Weeks    Status On-going    Target Date 11/25/20               PT Long Term Goals - 11/04/20 1532       PT LONG TERM GOAL #1   Title PT to have and be using a compression pump    Time 6    Status On-going    Target Date 12/16/20      PT LONG TERM GOAL #2   Title PT to have lost 5-8 cm from measurement of thigh and LE not foot to reduce risk of cellulitis and make it easier to don compression garment    Time 6    Period Weeks    Status On-going      PT LONG TERM GOAL #3   Title PT to have and be donning a compression garment.    Time 6    Period Weeks    Status On-going                  Plan - 11/16/20 1236     Clinical Impression Statement Pt edema appears to have returned due to no treatment for a week.  Began B thigh  bandaging.    Personal Factors and Comorbidities Fitness;Time since onset of injury/illness/exacerbation;Comorbidity 2    Comorbidities obesity, OA    Examination-Activity Limitations Bathing;Bed Mobility;Carry;Dressing;Lift;Locomotion Level;Squat;Stairs;Stand;Transfers;Toileting    Examination-Participation Restrictions Church;Cleaning;Community Activity;Driving;Laundry;Shop;Yard Work    Merchant [employer] officer Evolving/Moderate complexity    Rehab Potential Good    PT Frequency 3x / week    PT Duration 6 weeks    PT Treatment/Interventions Therapeutic activities;Therapeutic exercise;Patient/family education;Manual techniques;Manual lymph drainage;Compression bandaging    PT Next Visit Plan Measure on Wed.    PT Home Exercise Plan ankle pumps, LAQ, ab/adduction, marching, diaphragmic breathing and lymph squeeze.    Consulted and Agree with Plan of Care Patient             Patient will benefit from skilled therapeutic intervention in order to improve the following deficits and impairments:  Abnormal gait, Cardiopulmonary status limiting activity, Decreased activity tolerance, Decreased balance, Decreased mobility, Decreased range of motion, Difficulty walking, Decreased strength, Decreased skin integrity, Increased edema, Obesity  Visit Diagnosis: Lymphedema, not elsewhere classified     Problem List Patient Active Problem List   Diagnosis Date Noted   Cellulitis 08/20/2020   Morbid obesity ([place]) 08/20/2020   Hypokalemia 08/20/2020   Cellulitis of right lower extremity 08/20/2020   AKI (acute kidney injury) ([place]) 08/20/2020   Hypertension    Essential hypertension 07/08/2015   Non Hodgkin's lymphoma ([place]) 02/21/2006   Rayetta Humphrey, PT CLT [phone] , PT 11/16/2020, 12:39 PM  [facility] [address] [place], [place], [address] Phone: [phone]   Fax:  [phone]  Name: Allison Short MRN: [id number] Date of  Birth: April 11, 1950

## 2020-11-18 ENCOUNTER — Ambulatory Visit (HOSPITAL_COMMUNITY): Payer: Medicare HMO | Admitting: Physical Therapy

## 2020-11-18 ENCOUNTER — Other Ambulatory Visit: Payer: Self-pay

## 2020-11-18 DIAGNOSIS — I890 Lymphedema, not elsewhere classified: Secondary | ICD-10-CM

## 2020-11-18 NOTE — Therapy (Signed)
[facility] [address] [place], [place], [address] Phone: [phone]   Fax:  [phone]  Physical Therapy Treatment  Patient Details  Name: Lamya Lausch MRN: [id number] Date of Birth: [birth year] Referring Provider (PT): Sallee Provencal   Encounter Date: 11/18/2020   PT End of Session - 11/18/20 1312     Visit Number 5    Number of Visits 18    Date for PT Re-Evaluation 12/16/20    Authorization Type [employer] medicare- authorization requested    Progress Note Due on Visit 10    PT Start Time 1050    PT Stop Time 1215    PT Time Calculation (min) 85 min    Activity Tolerance Patient tolerated treatment well    Behavior During Therapy [facility] for tasks assessed/performed             Past Medical History:  Diagnosis Date   History of blood transfusion    2016   History of urinary tract infection    Hypertension    Non Hodgkin's lymphoma ([place]) 2008   Obesity    Osteopenia 04/2017   T score -1.4 FRAX 7.5% / 0.8%   Pancreatitis     Past Surgical History:  Procedure Laterality Date   BIOPSY THYROID     BONE MARROW BIOPSY     CESAREAN SECTION  10/26/1975   CHOLECYSTECTOMY     2006   PORTA CATH INSERTION     TONSILLECTOMY AND ADENOIDECTOMY     1952   TOTAL KNEE ARTHROPLASTY Bilateral    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   Subjective Assessment - 11/18/20 1305     Subjective Pt states she felf claustophobic with the thigh bandages on and got to where she could hardly breathe.  STates she ended up removing them Monday evening and her breathing automatically improved as her anxiety decreased.  Pt requested to leave the thigh bandages off.    Currently in Pain? No/denies                   LYMPHEDEMA/ONCOLOGY QUESTIONNAIRE - 11/18/20 1308       Lymphedema Assessments   Lymphedema Assessments Lower extremities      Right Lower Extremity Lymphedema   20 cm Proximal to Suprapatella 88  cm   was 89   10 cm Proximal to Suprapatella 90 cm   was 90.5   At Midpatella/Popliteal Crease 72 cm   was 73.5   30 cm Proximal to Floor at Lateral Plantar Foot 69.5 cm   was 73   20 cm Proximal to Floor at Lateral Plantar Foot 62 1   was 62.3   10 cm Proximal to Floor at Lateral Malleoli 40 cm   was 40.5   Circumference of ankle/heel 37 cm.   was 38.5   5 cm Proximal to 1st MTP Joint 29 cm   was 29   Across MTP Joint 27.5 cm   was 28   Around Proximal Great Toe 10 cm   no measurement taken     Left Lower Extremity Lymphedema   20 cm Proximal to Suprapatella 85 cm   was 89.2   10 cm Proximal to Suprapatella 84.5 cm   was 88.9   At Midpatella/Popliteal Crease 68 cm   was 72.8   30 cm Proximal to Floor at Lateral Plantar Foot 64 cm   was 64.1   20 cm  Proximal to Floor at Lateral Plantar Foot 47 cm   was 50.9   10 cm Proximal to Floor at Lateral Malleoli 36 cm   was 39.2   Circumference of ankle/heel 37.5 cm.   was 37.7   5 cm Proximal to 1st MTP Joint 26 cm   was 27   Across MTP Joint 25 cm   was 27.2   Around Proximal Great Toe 9 cm   was 9.5                       [facility] Adult PT Treatment/Exercise - 11/18/20 0001       Manual Therapy   Manual Therapy Manual Lymphatic Drainage (MLD);Compression Bandaging    Manual therapy comments Manual complete separate than rest of tx    Manual Lymphatic Drainage (MLD) decongestive techniques including supraclavicular, deep and superfical abdominal inguinal/axillary anastomosis and LE.  Manual completed B anterior only    Compression Bandaging from toe to thigh B using 1/2" foam and multilayer short stretch bandaging.    Other Manual Therapy measurements completed                       PT Short Term Goals - 11/16/20 1238       PT SHORT TERM GOAL #1   Title Pt to be I in HEP to improve lymphatic circulation    Time 2    Period Weeks    Status On-going    Target Date 11/18/20      PT SHORT TERM GOAL #2   Title  PT to have lost 4 cm in measurement to allow pt to be able to lift her leg and put it on the bed    Time 3    Period Weeks    Status On-going    Target Date 11/25/20               PT Long Term Goals - 11/16/20 1238       PT LONG TERM GOAL #1   Title PT to have and be using a compression pump    Time 6    Status On-going      PT LONG TERM GOAL #2   Title PT to have lost 5-8 cm from measurement of thigh and LE not foot to reduce risk of cellulitis and make it easier to don compression garment    Time 6    Period Weeks    Status On-going      PT LONG TERM GOAL #3   Title PT to have and be donning a compression garment.    Time 6    Period Weeks    Status On-going                   Plan - 11/18/20 1326     Clinical Impression Statement Bil LE's measured with noted decongestion in both LE's.  The Lt showing more reduction than Rt, however this LE is worse and will take more time to reduce induration and total volume.  Pt returns today with reported anxiety attack when progressed bandaging to thigh level bilaterally.  Able to convince to try the Rt thigh this session and see if just dong one is tolerated.  Manual lymph drainage completed with bandaging to knee high Lt and full thigh Rt.  Pt reported overall comfort following treatment.    Personal Factors and Comorbidities Fitness;Time since onset of injury/illness/exacerbation;Comorbidity 2  Comorbidities obesity, OA    Examination-Activity Limitations Bathing;Bed Mobility;Carry;Dressing;Lift;Locomotion Level;Squat;Stairs;Stand;Transfers;Toileting    Examination-Participation Restrictions Church;Cleaning;Community Activity;Driving;Laundry;Shop;Yard Work    [employer]chant [employer] officer Evolving/Moderate complexity    Rehab Potential Good    PT Frequency 3x / week    PT Duration 6 weeks    PT Treatment/Interventions Therapeutic activities;Therapeutic exercise;Patient/family education;Manual techniques;Manual  lymph drainage;Compression bandaging    PT Next Visit Plan Continue complete lymphedema treatment for bil LE'.  Attempt to resume Lt thigh if Rt was tolerated.  Measure on Wed.    PT Home Exercise Plan ankle pumps, LAQ, ab/adduction, marching, diaphragmic breathing and lymph squeeze.    Consulted and Agree with Plan of Care Patient             Patient will benefit from skilled therapeutic intervention in order to improve the following deficits and impairments:  Abnormal gait, Cardiopulmonary status limiting activity, Decreased activity tolerance, Decreased balance, Decreased mobility, Decreased range of motion, Difficulty walking, Decreased strength, Decreased skin integrity, Increased edema, Obesity  Visit Diagnosis: Lymphedema, not elsewhere classified     Problem List Patient Active Problem List   Diagnosis Date Noted   Cellulitis 08/20/2020   Morbid obesity ([place]) 08/20/2020   Hypokalemia 08/20/2020   Cellulitis of right lower extremity 08/20/2020   AKI (acute kidney injury) ([place]) 08/20/2020   Hypertension    Essential hypertension 07/08/2015   Non Hodgkin's lymphoma ([place]) 02/21/2006   Teena Irani, PTA/CLT [phone]  Teena Irani, PTA 11/18/2020, 1:27 PM  [facility] [address] [place], [place], [address] Phone: [phone]   Fax:  [phone]  Name: Tenley Winward MRN: [id number] Date of Birth: 01-Jul-1950

## 2020-11-20 ENCOUNTER — Ambulatory Visit (HOSPITAL_COMMUNITY): Payer: Medicare HMO | Admitting: Physical Therapy

## 2020-11-23 ENCOUNTER — Ambulatory Visit (HOSPITAL_COMMUNITY): Payer: Medicare HMO | Admitting: Physical Therapy

## 2020-11-25 ENCOUNTER — Encounter (HOSPITAL_COMMUNITY): Payer: Medicare HMO

## 2020-11-25 ENCOUNTER — Telehealth (HOSPITAL_COMMUNITY): Payer: Self-pay

## 2020-11-25 NOTE — Telephone Encounter (Signed)
Pt is still sick and hopes to be here for Friday

## 2020-11-27 ENCOUNTER — Encounter (HOSPITAL_COMMUNITY): Payer: Medicare HMO | Admitting: Physical Therapy

## 2020-11-27 ENCOUNTER — Telehealth (HOSPITAL_COMMUNITY): Payer: Self-pay | Admitting: Physical Therapy

## 2020-11-27 NOTE — Telephone Encounter (Signed)
The  transportation Allison Short broke down and she can not get here

## 2020-11-30 ENCOUNTER — Ambulatory Visit (HOSPITAL_COMMUNITY): Payer: Medicare HMO | Attending: Adult Health | Admitting: Physical Therapy

## 2020-11-30 ENCOUNTER — Other Ambulatory Visit: Payer: Self-pay

## 2020-11-30 DIAGNOSIS — I890 Lymphedema, not elsewhere classified: Secondary | ICD-10-CM | POA: Insufficient documentation

## 2020-11-30 NOTE — Therapy (Signed)
[facility] [address] [place], [place], [address] Phone: [phone]   Fax:  [phone]  Physical Therapy Treatment  Patient Details  Name: Allison Short MRN: [id number] Date of Birth: 05-27-1950 Referring Provider (PT): Sallee Provencal   Encounter Date: 11/30/2020   PT End of Session - 11/30/20 1051     Visit Number 6    Number of Visits 18    Date for PT Re-Evaluation 12/16/20    Authorization Type [employer] medicare- authorization requested    Progress Note Due on Visit 10    PT Start Time 1050    PT Stop Time 1215    PT Time Calculation (min) 85 min    Activity Tolerance Patient tolerated treatment well    Behavior During Therapy [facility] for tasks assessed/performed             Past Medical History:  Diagnosis Date   History of blood transfusion    2016   History of urinary tract infection    Hypertension    Non Hodgkin's lymphoma ([place]) 2008   Obesity    Osteopenia 04/2017   T score -1.4 FRAX 7.5% / 0.8%   Pancreatitis     Past Surgical History:  Procedure Laterality Date   BIOPSY THYROID     BONE MARROW BIOPSY     CESAREAN SECTION  10/26/1975   CHOLECYSTECTOMY     2006   PORTA CATH INSERTION     TONSILLECTOMY AND ADENOIDECTOMY     1952   TOTAL KNEE ARTHROPLASTY Bilateral    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   Subjective Assessment - 11/30/20 1231     Subjective Pt states that the Lucianne Lei that brings her to treatment broke down Friday so she missed her appointment.    Pertinent History B TKR, HTN, non hodgkins lymphoma    How long can you sit comfortably? sitting    How long can you walk comfortably? PT amublates with a rollator unable to ambulate greater than 70 ft without a quick rest.    Patient Stated Goals make it go away    Currently in Pain? No/denies    Pain Onset More than a month ago                               [facility] Adult PT Treatment/Exercise -  11/30/20 0001       Manual Therapy   Manual Therapy Manual Lymphatic Drainage (MLD);Compression Bandaging    Manual therapy comments Manual complete separate than rest of tx    Manual Lymphatic Drainage (MLD) decongestive techniques including supraclavicular, deep and superfical abdominal inguinal/axillary anastomosis and LE.  Manual completed B posterior done prone.    Compression Bandaging from toe to thigh B using 1/2" foam and multilayer short stretch bandaging.                       PT Short Term Goals - 11/16/20 1238       PT SHORT TERM GOAL #1   Title Pt to be I in HEP to improve lymphatic circulation    Time 2    Period Weeks    Status On-going    Target Date 11/18/20      PT SHORT TERM GOAL #2   Title PT to have lost 4 cm in measurement to allow pt to  be able to lift her leg and put it on the bed    Time 3    Period Weeks    Status On-going    Target Date 11/25/20               PT Long Term Goals - 11/16/20 1238       PT LONG TERM GOAL #1   Title PT to have and be using a compression pump    Time 6    Status On-going      PT LONG TERM GOAL #2   Title PT to have lost 5-8 cm from measurement of thigh and LE not foot to reduce risk of cellulitis and make it easier to don compression garment    Time 6    Period Weeks    Status On-going      PT LONG TERM GOAL #3   Title PT to have and be donning a compression garment.    Time 6    Period Weeks    Status On-going                   Plan - 11/30/20 1051     Clinical Impression Statement Cut foam for Upper thigh so it did not ride up as high as pt stated she was having trouble getting her underwear down and up.  Extra time spent on indurated areas B.    Personal Factors and Comorbidities Fitness;Time since onset of injury/illness/exacerbation;Comorbidity 2    Comorbidities obesity, OA    Examination-Activity Limitations Bathing;Bed Mobility;Carry;Dressing;Lift;Locomotion  Level;Squat;Stairs;Stand;Transfers;Toileting    Examination-Participation Restrictions Church;Cleaning;Community Activity;Driving;Laundry;Shop;Yard Work    [employer]chant [employer] officer Evolving/Moderate complexity    Rehab Potential Good    PT Frequency 3x / week    PT Duration 6 weeks    PT Treatment/Interventions Therapeutic activities;Therapeutic exercise;Patient/family education;Manual techniques;Manual lymph drainage;Compression bandaging    PT Next Visit Plan Continue complete lymphedema treatment for bil LE'.  Attempt to resume Lt thigh if Rt was tolerated.  Measure on Wed.    PT Home Exercise Plan ankle pumps, LAQ, ab/adduction, marching, diaphragmic breathing and lymph squeeze.    Consulted and Agree with Plan of Care Patient             Patient will benefit from skilled therapeutic intervention in order to improve the following deficits and impairments:  Abnormal gait, Cardiopulmonary status limiting activity, Decreased activity tolerance, Decreased balance, Decreased mobility, Decreased range of motion, Difficulty walking, Decreased strength, Decreased skin integrity, Increased edema, Obesity  Visit Diagnosis: Lymphedema, not elsewhere classified     Problem List Patient Active Problem List   Diagnosis Date Noted   Cellulitis 08/20/2020   Morbid obesity ([place]) 08/20/2020   Hypokalemia 08/20/2020   Cellulitis of right lower extremity 08/20/2020   AKI (acute kidney injury) ([place]) 08/20/2020   Hypertension    Essential hypertension 07/08/2015   Non Hodgkin's lymphoma ([place]) 02/21/2006    Allison Short, PT CLT [phone] , PT 11/30/2020, 12:37 PM  [facility] [address] [place], [place], [address] Phone: [phone]   Fax:  [phone]  Name: Allison Short MRN: [id number] Date of Birth: 07/29/1950

## 2020-12-02 ENCOUNTER — Ambulatory Visit (HOSPITAL_COMMUNITY): Payer: Medicare HMO | Admitting: Physical Therapy

## 2020-12-02 ENCOUNTER — Other Ambulatory Visit: Payer: Self-pay

## 2020-12-02 DIAGNOSIS — I890 Lymphedema, not elsewhere classified: Secondary | ICD-10-CM | POA: Diagnosis not present

## 2020-12-02 NOTE — Therapy (Signed)
[facility] [address] [place], [place], [address] Phone: [phone]   Fax:  [phone]  Physical Therapy Treatment  Patient Details  Name: Allison Short MRN: [id number] Date of Birth: 07-19-1950 Referring Provider (PT): Sallee Provencal   Encounter Date: 12/02/2020   PT End of Session - 12/02/20 1644     Visit Number 7    Number of Visits 18    Date for PT Re-Evaluation 12/16/20    Authorization Type [employer] medicare- authorization requested    Progress Note Due on Visit 10    PT Start Time 1055    PT Stop Time 1225    PT Time Calculation (min) 90 min    Activity Tolerance Patient tolerated treatment well    Behavior During Therapy [facility] for tasks assessed/performed             Past Medical History:  Diagnosis Date   History of blood transfusion    2016   History of urinary tract infection    Hypertension    Non Hodgkin's lymphoma ([place]) 2008   Obesity    Osteopenia 04/2017   T score -1.4 FRAX 7.5% / 0.8%   Pancreatitis     Past Surgical History:  Procedure Laterality Date   BIOPSY THYROID     BONE MARROW BIOPSY     CESAREAN SECTION  10/26/1975   CHOLECYSTECTOMY     2006   PORTA CATH INSERTION     TONSILLECTOMY AND ADENOIDECTOMY     1952   TOTAL KNEE ARTHROPLASTY Bilateral    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   Subjective Assessment - 12/02/20 1641     Subjective pt states she is doing well today.  No issues. can tell her legs are smaller and she's beginning to complete tasks and function better overall.    Currently in Pain? No/denies                   LYMPHEDEMA/ONCOLOGY QUESTIONNAIRE - 12/02/20 1642       Lymphedema Assessments   Lymphedema Assessments Lower extremities      Right Lower Extremity Lymphedema   20 cm Proximal to Suprapatella 84.5 cm   was 89   10 cm Proximal to Suprapatella 84 cm   was 90.5   At Midpatella/Popliteal Crease 70.8 cm   was 73.5    30 cm Proximal to Floor at Lateral Plantar Foot 64.6 cm   was 73   20 cm Proximal to Floor at Lateral Plantar Foot 59 1   was 62.3   10 cm Proximal to Floor at Lateral Malleoli 39 cm   was 40.5   Circumference of ankle/heel 36.8 cm.   was 38.5   5 cm Proximal to 1st MTP Joint 27.8 cm   was 29   Across MTP Joint 26.5 cm   was 28   Around Proximal Great Toe 10 cm   no measurement taken     Left Lower Extremity Lymphedema   20 cm Proximal to Suprapatella 83.6 cm   was 89.2   10 cm Proximal to Suprapatella 83.5 cm   was 88.9   At Midpatella/Popliteal Crease 68 cm   was 72.8   30 cm Proximal to Floor at Lateral Plantar Foot 60.7 cm   was 64.1   20 cm Proximal to Floor at Lateral Plantar Foot 47 cm   was 50.9   10 cm Proximal to  Floor at Lateral Malleoli 33.4 cm   was 39.2   Circumference of ankle/heel 35.5 cm.   was 37.7   5 cm Proximal to 1st MTP Joint 26.4 cm   was 27   Across MTP Joint 26 cm   was 27.2   Around Proximal Great Toe 8.8 cm   was 9.5                       [facility] Adult PT Treatment/Exercise - 12/02/20 0001       Manual Therapy   Manual Therapy Manual Lymphatic Drainage (MLD);Compression Bandaging    Manual therapy comments Manual complete separate than rest of tx    Manual Lymphatic Drainage (MLD) decongestive techniques including supraclavicular, deep and superfical abdominal inguinal/axillary anastomosis and LE.  Manual completed B posterior done prone.    Compression Bandaging from toe to thigh B using 1/2" foam and multilayer short stretch bandaging.    Other Manual Therapy measurements                       PT Short Term Goals - 11/16/20 1238       PT SHORT TERM GOAL #1   Title Pt to be I in HEP to improve lymphatic circulation    Time 2    Period Weeks    Status On-going    Target Date 11/18/20      PT SHORT TERM GOAL #2   Title PT to have lost 4 cm in measurement to allow pt to be able to lift her leg and put it on the bed     Time 3    Period Weeks    Status On-going    Target Date 11/25/20               PT Long Term Goals - 11/16/20 1238       PT LONG TERM GOAL #1   Title PT to have and be using a compression pump    Time 6    Status On-going      PT LONG TERM GOAL #2   Title PT to have lost 5-8 cm from measurement of thigh and LE not foot to reduce risk of cellulitis and make it easier to don compression garment    Time 6    Period Weeks    Status On-going      PT LONG TERM GOAL #3   Title PT to have and be donning a compression garment.    Time 6    Period Weeks    Status On-going                   Plan - 12/02/20 1645     Clinical Impression Statement LE's measured this session with further reduction noted, especially in thighs and at proximal lower legs.  Little change in bill feet today.  Pt with overall improving function as well with increased walking and standing tolerance and ability to get LE into bed without use of "leg lifter" and reports her next goal is to be able to get LE into shower (over tub) without "leg lifter". Pt continues to require frequent sitting rest breaks while completing Upper thigh bandaging.  3X with Lt and 2X while doing Rt today.    Personal Factors and Comorbidities Fitness;Time since onset of injury/illness/exacerbation;Comorbidity 2    Comorbidities obesity, OA    Examination-Activity Limitations Bathing;Bed Mobility;Carry;Dressing;Lift;Locomotion Level;Squat;Stairs;Stand;Transfers;Toileting    Examination-Participation Restrictions  Church;Cleaning;Community Activity;Driving;Laundry;Shop;Yard Work    [employer]chant [employer] officer Evolving/Moderate complexity    Rehab Potential Good    PT Frequency 3x / week    PT Duration 6 weeks    PT Treatment/Interventions Therapeutic activities;Therapeutic exercise;Patient/family education;Manual techniques;Manual lymph drainage;Compression bandaging    PT Next Visit Plan Continue complete lymphedema  treatment for bil LE's.  measure on wednesdays    PT Home Exercise Plan ankle pumps, LAQ, ab/adduction, marching, diaphragmic breathing and lymph squeeze.    Consulted and Agree with Plan of Care Patient             Patient will benefit from skilled therapeutic intervention in order to improve the following deficits and impairments:  Abnormal gait, Cardiopulmonary status limiting activity, Decreased activity tolerance, Decreased balance, Decreased mobility, Decreased range of motion, Difficulty walking, Decreased strength, Decreased skin integrity, Increased edema, Obesity  Visit Diagnosis: Lymphedema, not elsewhere classified     Problem List Patient Active Problem List   Diagnosis Date Noted   Cellulitis 08/20/2020   Morbid obesity ([place]) 08/20/2020   Hypokalemia 08/20/2020   Cellulitis of right lower extremity 08/20/2020   AKI (acute kidney injury) ([place]) 08/20/2020   Hypertension    Essential hypertension 07/08/2015   Non Hodgkin's lymphoma ([place]) 02/21/2006   Teena Irani, PTA/CLT, WTA [phone]  Teena Irani, PTA 12/02/2020, 4:46 PM  [facility] [place], [place], [address] Phone: [phone]   Fax:  [phone]  Name: Allison Short MRN: [id number] Date of Birth: November 18, 1950

## 2020-12-04 ENCOUNTER — Ambulatory Visit (HOSPITAL_COMMUNITY): Payer: Medicare HMO

## 2020-12-04 ENCOUNTER — Encounter: Payer: Self-pay | Admitting: Adult Health

## 2020-12-04 ENCOUNTER — Encounter (HOSPITAL_COMMUNITY): Payer: Self-pay

## 2020-12-04 ENCOUNTER — Other Ambulatory Visit: Payer: Self-pay

## 2020-12-04 DIAGNOSIS — I890 Lymphedema, not elsewhere classified: Secondary | ICD-10-CM | POA: Diagnosis not present

## 2020-12-04 NOTE — Therapy (Signed)
[facility] [address] [place], [place], [address] Phone: [phone]   Fax:  [phone]  Physical Therapy Treatment  Patient Details  Name: Allison Short MRN: [id number] Date of Birth: 02-17-1951 Referring Provider (PT): Sallee Provencal   Encounter Date: 12/04/2020   PT End of Session - 12/04/20 1255     Visit Number 8    Number of Visits 18    Date for PT Re-Evaluation 12/16/20    Authorization Type [employer] medicare- authorization requested    Progress Note Due on Visit 10    PT Start Time 3383    PT Stop Time 1218    PT Time Calculation (min) 91 min    Activity Tolerance Patient tolerated treatment well    Behavior During Therapy [facility] for tasks assessed/performed             Past Medical History:  Diagnosis Date   History of blood transfusion    2016   History of urinary tract infection    Hypertension    Non Hodgkin's lymphoma ([place]) 2008   Obesity    Osteopenia 04/2017   T score -1.4 FRAX 7.5% / 0.8%   Pancreatitis     Past Surgical History:  Procedure Laterality Date   BIOPSY THYROID     BONE MARROW BIOPSY     CESAREAN SECTION  10/26/1975   CHOLECYSTECTOMY     2006   PORTA CATH INSERTION     TONSILLECTOMY AND ADENOIDECTOMY     1952   TOTAL KNEE ARTHROPLASTY Bilateral    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   Subjective Assessment - 12/04/20 1252     Subjective Pt stated she feels claustrophobic with Bil thighs, had to remove at 3:00 Thursdays morning.  reports improvements with mobility and ability to stand for 30 minutes to prepare a small meal.    Pertinent History B TKR, HTN, non hodgkins lymphoma    Patient Stated Goals make it go away    Currently in Pain? No/denies                               [facility] Adult PT Treatment/Exercise - 12/04/20 0001       Manual Therapy   Manual Therapy Manual Lymphatic Drainage (MLD);Compression Bandaging    Manual  therapy comments Manual complete separate than rest of tx    Manual Lymphatic Drainage (MLD) decongestive techniques including supraclavicular, deep and superfical abdominal inguinal/axillary anastomosis and LE.  Manual completed B anterior and posterior done sidelying.    Compression Bandaging from toe to thigh B using 1/2" foam and multilayer short stretch bandaging.                       PT Short Term Goals - 11/16/20 1238       PT SHORT TERM GOAL #1   Title Pt to be I in HEP to improve lymphatic circulation    Time 2    Period Weeks    Status On-going    Target Date 11/18/20      PT SHORT TERM GOAL #2   Title PT to have lost 4 cm in measurement to allow pt to be able to lift her leg and put it on the bed    Time 3    Period Weeks    Status On-going  Target Date 11/25/20               PT Long Term Goals - 11/16/20 1238       PT LONG TERM GOAL #1   Title PT to have and be using a compression pump    Time 6    Status On-going      PT LONG TERM GOAL #2   Title PT to have lost 5-8 cm from measurement of thigh and LE not foot to reduce risk of cellulitis and make it easier to don compression garment    Time 6    Period Weeks    Status On-going      PT LONG TERM GOAL #3   Title PT to have and be donning a compression garment.    Time 6    Period Weeks    Status On-going                   Plan - 12/04/20 1255     Clinical Impression Statement Manual decongestive techniques complete anterior and posterior followed by application of multilayer short stretch bandages BLE thigh high wiht 1/2in foam.  Pt presents with improved activitiy tolerance noted with no rest breaks required walking in and out of dept, only required 1 seated rest break during upper thigh bandaging.    Personal Factors and Comorbidities Fitness;Time since onset of injury/illness/exacerbation;Comorbidity 2    Comorbidities obesity, OA    Examination-Activity Limitations  Bathing;Bed Mobility;Carry;Dressing;Lift;Locomotion Level;Squat;Stairs;Stand;Transfers;Toileting    Examination-Participation Restrictions Church;Cleaning;Community Activity;Driving;Laundry;Shop;Yard Work    [employer] officer Evolving/Moderate complexity    Clinical Decision Making Moderate    Rehab Potential Good    PT Frequency 3x / week    PT Duration 6 weeks    PT Treatment/Interventions Therapeutic activities;Therapeutic exercise;Patient/family education;Manual techniques;Manual lymph drainage;Compression bandaging    PT Next Visit Plan Continue complete lymphedema treatment for bil LE's.  measure on wednesdays    PT Home Exercise Plan ankle pumps, LAQ, ab/adduction, marching, diaphragmic breathing and lymph squeeze.    Consulted and Agree with Plan of Care Patient             Patient will benefit from skilled therapeutic intervention in order to improve the following deficits and impairments:  Abnormal gait, Cardiopulmonary status limiting activity, Decreased activity tolerance, Decreased balance, Decreased mobility, Decreased range of motion, Difficulty walking, Decreased strength, Decreased skin integrity, Increased edema, Obesity  Visit Diagnosis: Lymphedema, not elsewhere classified     Problem List Patient Active Problem List   Diagnosis Date Noted   Cellulitis 08/20/2020   Morbid obesity ([place]) 08/20/2020   Hypokalemia 08/20/2020   Cellulitis of right lower extremity 08/20/2020   AKI (acute kidney injury) ([facility]) 08/20/2020   Hypertension    Essential hypertension 07/08/2015   Non Hodgkin's lymphoma ([place]) 02/21/2006   Allison Short, LPTA/CLT; CBIS [phone]  Aldona Lento, PTA 12/04/2020, 1:08 PM  [facility] [address] [place], [place], [address] Phone: [phone]   Fax:  [phone]  Name: Allison Short MRN: [id number] Date of Birth: [birth year]

## 2020-12-04 NOTE — Telephone Encounter (Signed)
FYI

## 2020-12-07 ENCOUNTER — Other Ambulatory Visit: Payer: Self-pay

## 2020-12-07 ENCOUNTER — Ambulatory Visit (HOSPITAL_COMMUNITY): Payer: Medicare HMO | Admitting: Physical Therapy

## 2020-12-07 DIAGNOSIS — I890 Lymphedema, not elsewhere classified: Secondary | ICD-10-CM

## 2020-12-07 NOTE — Therapy (Signed)
[facility] [address] [place], [place], [address] Phone: [phone]   Fax:  [phone]  Physical Therapy Treatment  Patient Details  Name: Allison Short MRN: [id number] Date of Birth: 10/13/1950 Referring Provider (PT): Sallee Provencal   Encounter Date: 12/07/2020   PT End of Session - 12/07/20 1607     Visit Number 9    Number of Visits 18    Date for PT Re-Evaluation 12/16/20    Authorization Type [employer] medicare- authorization requested    Progress Note Due on Visit 10    PT Start Time 2902    PT Stop Time 1115    PT Time Calculation (min) 84 min    Activity Tolerance Patient tolerated treatment well    Behavior During Therapy [facility] for tasks assessed/performed             Past Medical History:  Diagnosis Date   History of blood transfusion    2016   History of urinary tract infection    Hypertension    Non Hodgkin's lymphoma ([place]) 2008   Obesity    Osteopenia 04/2017   T score -1.4 FRAX 7.5% / 0.8%   Pancreatitis     Past Surgical History:  Procedure Laterality Date   BIOPSY THYROID     BONE MARROW BIOPSY     CESAREAN SECTION  10/26/1975   CHOLECYSTECTOMY     2006   PORTA CATH INSERTION     TONSILLECTOMY AND ADENOIDECTOMY     1952   TOTAL KNEE ARTHROPLASTY Bilateral    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   Subjective Assessment - 12/07/20 1606     Subjective pt states her Rt LE is hurting a little more today and has less energy than when she came on Friday.    Currently in Pain? No/denies                               [facility] Adult PT Treatment/Exercise - 12/07/20 0001       Manual Therapy   Manual Therapy Manual Lymphatic Drainage (MLD);Compression Bandaging    Manual therapy comments Manual complete separate than rest of tx    Manual Lymphatic Drainage (MLD) decongestive techniques including supraclavicular, deep and superfical abdominal  inguinal/axillary anastomosis and LE.  Manual completed B anterior and posterior done sidelying.    Compression Bandaging from toe to thigh B using 1/2" foam and multilayer short stretch bandaging.                     PT Education - 12/07/20 1609     Education Details Pt with questions regarding use of pump and garments.  Questions answered.    Person(s) Educated Patient    Methods Explanation    Comprehension Verbalized understanding              PT Short Term Goals - 11/16/20 1238       PT SHORT TERM GOAL #1   Title Pt to be I in HEP to improve lymphatic circulation    Time 2    Period Weeks    Status On-going    Target Date 11/18/20      PT SHORT TERM GOAL #2   Title PT to have lost 4 cm in measurement to allow pt to be able to lift her leg and put it on  the bed    Time 3    Period Weeks    Status On-going    Target Date 11/25/20               PT Long Term Goals - 11/16/20 1238       PT LONG TERM GOAL #1   Title PT to have and be using a compression pump    Time 6    Status On-going      PT LONG TERM GOAL #2   Title PT to have lost 5-8 cm from measurement of thigh and LE not foot to reduce risk of cellulitis and make it easier to don compression garment    Time 6    Period Weeks    Status On-going      PT LONG TERM GOAL #3   Title PT to have and be donning a compression garment.    Time 6    Period Weeks    Status On-going                   Plan - 12/07/20 1739     Clinical Impression Statement Pt with increased difficulty walking back to treatment room this session.  Required 2 standing rest breaks leaned over on walker.  Continued with manual lymph drainage for both anterior and posterior LE's.  Pt required 1 seated rest breaks per leg with thigh bandaging this session.  No rest break walking out of dept.  Encouraged to increase her activity/walking over the weekends.    Personal Factors and Comorbidities Fitness;Time since  onset of injury/illness/exacerbation;Comorbidity 2    Comorbidities obesity, OA    Examination-Activity Limitations Bathing;Bed Mobility;Carry;Dressing;Lift;Locomotion Level;Squat;Stairs;Stand;Transfers;Toileting    Examination-Participation Restrictions Church;Cleaning;Community Activity;Driving;Laundry;Shop;Yard Work    [employer]chant [employer] officer Evolving/Moderate complexity    Rehab Potential Good    PT Frequency 3x / week    PT Duration 6 weeks    PT Treatment/Interventions Therapeutic activities;Therapeutic exercise;Patient/family education;Manual techniques;Manual lymph drainage;Compression bandaging    PT Next Visit Plan Continue complete lymphedema treatment for bil LE's.  measure on wednesdays.  complete 10th visit PN next session.    PT Home Exercise Plan ankle pumps, LAQ, ab/adduction, marching, diaphragmic breathing and lymph squeeze.    Consulted and Agree with Plan of Care Patient             Patient will benefit from skilled therapeutic intervention in order to improve the following deficits and impairments:  Abnormal gait, Cardiopulmonary status limiting activity, Decreased activity tolerance, Decreased balance, Decreased mobility, Decreased range of motion, Difficulty walking, Decreased strength, Decreased skin integrity, Increased edema, Obesity  Visit Diagnosis: Lymphedema, not elsewhere classified     Problem List Patient Active Problem List   Diagnosis Date Noted   Cellulitis 08/20/2020   Morbid obesity ([place]) 08/20/2020   Hypokalemia 08/20/2020   Cellulitis of right lower extremity 08/20/2020   AKI (acute kidney injury) ([place]) 08/20/2020   Hypertension    Essential hypertension 07/08/2015   Non Hodgkin's lymphoma ([place]) 02/21/2006   Allison Short, PTA/CLT, WTA [phone]  Allison Short, PTA 12/07/2020, 5:41 PM  [place], [place], [address] Phone: [phone]   Fax:   [phone]  Name: Allison Short MRN: [id number] Date of Birth: 1950-09-23

## 2020-12-09 ENCOUNTER — Encounter (HOSPITAL_COMMUNITY): Payer: Self-pay

## 2020-12-09 ENCOUNTER — Ambulatory Visit (HOSPITAL_COMMUNITY): Payer: Medicare HMO

## 2020-12-09 ENCOUNTER — Other Ambulatory Visit: Payer: Self-pay

## 2020-12-09 DIAGNOSIS — I890 Lymphedema, not elsewhere classified: Secondary | ICD-10-CM | POA: Diagnosis not present

## 2020-12-09 NOTE — Therapy (Addendum)
[facility] [address] [place], [place], [address] Phone: [phone]   Fax:  [phone]  Physical Therapy Treatment  Patient Details  Name: Allison Short MRN: [id number] Date of Birth: [birth year] Referring Provider (PT): Sallee Provencal  Progress Note Reporting Period 11/04/2020 to 12/09/2020  See note below for Objective Data and Assessment of Progress/Goals.     Encounter Date: 12/09/2020   PT End of Session - 12/09/20 1306     Visit Number 10    Number of Visits 18    Date for PT Re-Evaluation 12/16/20    Authorization Type [employer] medicare-    Authorization Time Period no visit limit authorization required    Progress Note Due on Visit 20    PT Start Time 1047    PT Stop Time 1222    PT Time Calculation (min) 95 min    Activity Tolerance Patient tolerated treatment well    Behavior During Therapy [facility] for tasks assessed/performed             Past Medical History:  Diagnosis Date   History of blood transfusion    2016   History of urinary tract infection    Hypertension    Non Hodgkin's lymphoma ([place]) 2008   Obesity    Osteopenia 04/2017   T score -1.4 FRAX 7.5% / 0.8%   Pancreatitis     Past Surgical History:  Procedure Laterality Date   BIOPSY THYROID     BONE MARROW BIOPSY     CESAREAN SECTION  10/26/1975   CHOLECYSTECTOMY     2006   PORTA CATH INSERTION     TONSILLECTOMY AND ADENOIDECTOMY     1952   TOTAL KNEE ARTHROPLASTY Bilateral    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   Subjective Assessment - 12/09/20 1256     Subjective Pt stated she is feeling good today, no reports of pain currenty.  Reoprts ability to stand to cook wihtout rest breaks required and ability to get into bed and car without assistance required.  Reports to receive pump tomorrow.    Pertinent History B TKR, HTN, non hodgkins lymphoma    Patient Stated Goals make it go away    Currently in Pain?  No/denies                   LYMPHEDEMA/ONCOLOGY QUESTIONNAIRE - 12/09/20 0001       Lymphedema Assessments   Lymphedema Assessments Lower extremities      Right Lower Extremity Lymphedema   20 cm Proximal to Suprapatella 83 cm   11/04/20 initial eval was: 89   10 cm Proximal to Suprapatella 88 cm   11/04/20 initial eval was: 88   At Midpatella/Popliteal Crease 68 cm   11/04/20 initial eval was: 73.5   30 cm Proximal to Floor at Lateral Plantar Foot 67 cm   11/04/20 initial eval was: 73   20 cm Proximal to Floor at Lateral Plantar Foot 57.5 1   11/04/20 initial eval was: 62.3   10 cm Proximal to Floor at Lateral [place]oli 40.3 cm   11/04/20 initial eval was: 40.5   Circumference of ankle/heel 36.4 cm.   11/04/20 initial eval was: 38.5   5 cm Proximal to 1st MTP Joint 28.4 cm   11/04/20 initial eval was: 29   Across MTP Joint 26.3 cm   11/04/20 initial eval was: 28   Around Proximal Great Toe  10 cm   11/04/20 initial eval was: no measurement taken, has been 10cm x 2 weeks     Left Lower Extremity Lymphedema   20 cm Proximal to Suprapatella 83 cm   11/04/20 initial eval was: 89.2   10 cm Proximal to Suprapatella 82.4 cm   11/04/20 initial eval was: 82.4   At Midpatella/Popliteal Crease 67.4 cm   11/04/20 initial eval was: 72.8   30 cm Proximal to Floor at Lateral Plantar Foot 61.5 cm   11/04/20 initial eval was: 64.1   20 cm Proximal to Floor at Lateral Plantar Foot 47.4 cm   11/04/20 initial eval was: 50.9   10 cm Proximal to Floor at Lateral [place] 33.4 cm   11/04/20 initial eval was: 39.2   Circumference of ankle/heel 35.6 cm.   11/04/20 initial eval was: 37.7   5 cm Proximal to 1st MTP Joint 27 cm   11/04/20 initial eval was: 27   Across MTP Joint 25.8 cm   11/04/20 initial eval was: 27.2   Around Proximal Great Toe 8.8 cm   11/04/20 initial eval was: 8.8                       [facility] Adult PT Treatment/Exercise - 12/09/20 0001       Manual Therapy   Manual Therapy Manual  Lymphatic Drainage (MLD);Compression Bandaging    Manual therapy comments Manual complete separate than rest of tx    Manual Lymphatic Drainage (MLD) decongestive techniques including supraclavicular, deep and superfical abdominal inguinal/axillary anastomosis and LE.  Manual completed B anterior only due to time restrictions    Compression Bandaging from toe to thigh B using 1/2" foam and multilayer short stretch bandaging.    Other Manual Therapy measurements                       PT Short Term Goals - 12/09/20 1314       PT SHORT TERM GOAL #1   Title Pt to be I in HEP to improve lymphatic circulation    Baseline 10/19: reports compliance wiht HEP.    Status Achieved      PT SHORT TERM GOAL #2   Title PT to have lost 4 cm in measurement to allow pt to be able to lift her leg and put it on the bed    Baseline 12/09/20:  See measurements.  Ability to get into bed and vehicle without difficulty.    Status Partially Met               PT Long Term Goals - 12/09/20 1315       PT LONG TERM GOAL #1   Title PT to have and be using a compression pump    Baseline 10/19:  Reports to receive pump tomorrow.    Status On-going      PT LONG TERM GOAL #2   Title PT to have lost 5-8 cm from measurement of thigh and LE not foot to reduce risk of cellulitis and make it easier to don compression garment    Status On-going      PT LONG TERM GOAL #3   Title PT to have and be donning a compression garment.    Status On-going                   Plan - 12/09/20 1310     Clinical Impression Statement 10th visit progress note wiht measurements  taken and reviewed goals.  Pt has decreased up to 6cm proximal with decreased reduction more distally.  Pt reoprts compliance with HEP and vast improvements wiht overall mobility including ability to stand to cook small meals, ability to get into car and bed without assistance required.  Pt to recieve pump tomorrow.  Reviewed  importance of removing bandages prior use of pump.    Personal Factors and Comorbidities Fitness;Time since onset of injury/illness/exacerbation;Comorbidity 2    Comorbidities obesity, OA    Examination-Activity Limitations Bathing;Bed Mobility;Carry;Dressing;Lift;Locomotion Level;Squat;Stairs;Stand;Transfers;Toileting    Examination-Participation Restrictions Church;Cleaning;Community Activity;Driving;Laundry;Shop;Yard Work    [employer]chant [employer] officer Evolving/Moderate complexity    Clinical Decision Making Moderate    Rehab Potential Good    PT Frequency 3x / week    PT Duration 6 weeks    PT Treatment/Interventions Therapeutic activities;Therapeutic exercise;Patient/family education;Manual techniques;Manual lymph drainage;Compression bandaging    PT Next Visit Plan Continue complete lymphedema treatment for bil LE's.  measure on wednesdays.    PT Home Exercise Plan ankle pumps, LAQ, ab/adduction, marching, diaphragmic breathing and lymph squeeze.    Consulted and Agree with Plan of Care Patient             Patient will benefit from skilled therapeutic intervention in order to improve the following deficits and impairments:  Abnormal gait, Cardiopulmonary status limiting activity, Decreased activity tolerance, Decreased balance, Decreased mobility, Decreased range of motion, Difficulty walking, Decreased strength, Decreased skin integrity, Increased edema, Obesity  Visit Diagnosis: Lymphedema, not elsewhere classified     Problem List Patient Active Problem List   Diagnosis Date Noted   Cellulitis 08/20/2020   Morbid obesity ([place]) 08/20/2020   Hypokalemia 08/20/2020   Cellulitis of right lower extremity 08/20/2020   AKI (acute kidney injury) ([place]) 08/20/2020   Hypertension    Essential hypertension 07/08/2015   Non Hodgkin's lymphoma ([place]) 02/21/2006   Ihor Austin, LPTA/CLT; CBIS [place], PT CLT [phone]  12/09/2020, 2:22  PM  [facility] [address] [place], [place], [address] Phone: [phone]   Fax:  [phone]  Name: Allison Short MRN: [id number] Date of Birth: Aug 03, 1950

## 2020-12-11 ENCOUNTER — Other Ambulatory Visit: Payer: Self-pay

## 2020-12-11 ENCOUNTER — Ambulatory Visit (HOSPITAL_COMMUNITY): Payer: Medicare HMO

## 2020-12-11 DIAGNOSIS — I890 Lymphedema, not elsewhere classified: Secondary | ICD-10-CM

## 2020-12-11 NOTE — Therapy (Signed)
[facility] [address] [place], [place], [address] Phone: [phone]   Fax:  [phone]  Physical Therapy Treatment  Patient Details  Name: Allison Short MRN: [id number] Date of Birth: September 17, 1950 Referring Provider (PT): Sallee Provencal   Encounter Date: 12/11/2020   PT End of Session - 12/11/20 1255     Visit Number 11    Number of Visits 18    Date for PT Re-Evaluation 12/16/20    Authorization Type [employer] medicare-    Authorization Time Period no visit limit authorization required    Progress Note Due on Visit 20    PT Start Time 1047    PT Stop Time 1220    PT Time Calculation (min) 93 min    Activity Tolerance Patient tolerated treatment well    Behavior During Therapy [facility] for tasks assessed/performed             Past Medical History:  Diagnosis Date   History of blood transfusion    2016   History of urinary tract infection    Hypertension    Non Hodgkin's lymphoma ([place]) 2008   Obesity    Osteopenia 04/2017   T score -1.4 FRAX 7.5% / 0.8%   Pancreatitis     Past Surgical History:  Procedure Laterality Date   BIOPSY THYROID     BONE MARROW BIOPSY     CESAREAN SECTION  10/26/1975   CHOLECYSTECTOMY     2006   PORTA CATH INSERTION     TONSILLECTOMY AND ADENOIDECTOMY     1952   TOTAL KNEE ARTHROPLASTY Bilateral    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   Subjective Assessment - 12/11/20 1906     Subjective Reports she had groceries delivered to home and able to put up wihtout rest breaks.  She is excited to try pump this Sunday.    Pertinent History B TKR, HTN, non hodgkins lymphoma    Patient Stated Goals make it go away    Currently in Pain? No/denies                               [facility] Adult PT Treatment/Exercise - 12/11/20 0001       Manual Therapy   Manual Therapy Manual Lymphatic Drainage (MLD);Compression Bandaging    Manual therapy comments  Manual complete separate than rest of tx    Manual Lymphatic Drainage (MLD) decongestive techniques including supraclavicular, deep and superfical abdominal inguinal/axillary anastomosis and LE.  Manual completed B anterior only due to time restrictions    Compression Bandaging from toe to thigh B using 1/2" foam and multilayer short stretch bandaging.                       PT Short Term Goals - 12/09/20 1314       PT SHORT TERM GOAL #1   Title Pt to be I in HEP to improve lymphatic circulation    Baseline 10/19: reports compliance wiht HEP.    Status Achieved      PT SHORT TERM GOAL #2   Title PT to have lost 4 cm in measurement to allow pt to be able to lift her leg and put it on the bed    Baseline 12/09/20:  See measurements.  Ability to get into bed and vehicle without difficulty.    Status  Partially Met               PT Long Term Goals - 12/09/20 1315       PT LONG TERM GOAL #1   Title PT to have and be using a compression pump    Baseline 10/19:  Reports to receive pump tomorrow.    Status On-going      PT LONG TERM GOAL #2   Title PT to have lost 5-8 cm from measurement of thigh and LE not foot to reduce risk of cellulitis and make it easier to don compression garment    Status On-going      PT LONG TERM GOAL #3   Title PT to have and be donning a compression garment.    Status On-going                   Plan - 12/11/20 1903     Clinical Impression Statement Manual decongestive complete anterior and posterior this session.  Application of multilayer short stretch bandages with 1/2in foam, reports of comfort.  Pt reports increased tolerance for standing and ability to put up her groceries without rest breaks.  She plans to try pump for the first time on Sunday.    Personal Factors and Comorbidities Fitness;Time since onset of injury/illness/exacerbation;Comorbidity 2    Comorbidities obesity, OA    Examination-Activity Limitations  Bathing;Bed Mobility;Carry;Dressing;Lift;Locomotion Level;Squat;Stairs;Stand;Transfers;Toileting    Examination-Participation Restrictions Church;Cleaning;Community Activity;Driving;Laundry;Shop;Yard Work    [employer] officer Evolving/Moderate complexity    Clinical Decision Making Moderate    Rehab Potential Good    PT Frequency 3x / week    PT Duration 6 weeks    PT Treatment/Interventions Therapeutic activities;Therapeutic exercise;Patient/family education;Manual techniques;Manual lymph drainage;Compression bandaging    PT Next Visit Plan Continue complete lymphedema treatment for bil LE's.  measure on wednesdays.    PT Home Exercise Plan ankle pumps, LAQ, ab/adduction, marching, diaphragmic breathing and lymph squeeze.    Consulted and Agree with Plan of Care Patient             Patient will benefit from skilled therapeutic intervention in order to improve the following deficits and impairments:  Abnormal gait, Cardiopulmonary status limiting activity, Decreased activity tolerance, Decreased balance, Decreased mobility, Decreased range of motion, Difficulty walking, Decreased strength, Decreased skin integrity, Increased edema, Obesity  Visit Diagnosis: Lymphedema, not elsewhere classified     Problem List Patient Active Problem List   Diagnosis Date Noted   Cellulitis 08/20/2020   Morbid obesity ([place]) 08/20/2020   Hypokalemia 08/20/2020   Cellulitis of right lower extremity 08/20/2020   AKI (acute kidney injury) ([place]) 08/20/2020   Hypertension    Essential hypertension 07/08/2015   Non Hodgkin's lymphoma ([place]) 02/21/2006   Ihor Austin, LPTA/CLT; CBIS [phone]  Aldona Lento, PTA 12/11/2020, 7:08 PM  [facility] [address] [place], [place], [address] Phone: [phone]   Fax:  [phone]  Name: Sharanda Shinault MRN: [id number] Date of Birth: 13-Aug-1950

## 2020-12-14 ENCOUNTER — Ambulatory Visit (HOSPITAL_COMMUNITY): Payer: Medicare HMO | Admitting: Physical Therapy

## 2020-12-14 ENCOUNTER — Other Ambulatory Visit: Payer: Self-pay

## 2020-12-14 DIAGNOSIS — I890 Lymphedema, not elsewhere classified: Secondary | ICD-10-CM | POA: Diagnosis not present

## 2020-12-14 NOTE — Therapy (Signed)
[facility] [address] [place], [place], [address] Phone: [phone]   Fax:  [phone]  Physical Therapy Treatment  Patient Details  Name: Allison Short MRN: [id number] Date of Birth: [birth year] Referring Provider (PT): Sallee Provencal   Encounter Date: 12/14/2020   PT End of Session - 12/14/20 1228     Visit Number 12    Number of Visits 18    Date for PT Re-Evaluation 12/16/20    Authorization Type [employer] medicare-    Authorization Time Period no visit limit authorization required    Progress Note Due on Visit 20    PT Start Time 1050    PT Stop Time 1220    PT Time Calculation (min) 90 min    Activity Tolerance Patient tolerated treatment well    Behavior During Therapy [facility] for tasks assessed/performed             Past Medical History:  Diagnosis Date   History of blood transfusion    2016   History of urinary tract infection    Hypertension    Non Hodgkin's lymphoma ([place]) 2008   Obesity    Osteopenia 04/2017   T score -1.4 FRAX 7.5% / 0.8%   Pancreatitis     Past Surgical History:  Procedure Laterality Date   BIOPSY THYROID     BONE MARROW BIOPSY     CESAREAN SECTION  10/26/1975   CHOLECYSTECTOMY     2006   PORTA CATH INSERTION     TONSILLECTOMY AND ADENOIDECTOMY     1952   TOTAL KNEE ARTHROPLASTY Bilateral    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   Subjective Assessment - 12/14/20 1225     Subjective Pt states that she really likes her pump.  She is able to get in and out of the car much easier now.    Pertinent History B TKR, HTN, non hodgkins lymphoma    How long can you walk comfortably? PT amublates with a rollator was unable to ambulate greater than 70 ft without a quick rest. Now able to ambulate x 129f without a rest    Patient Stated Goals make it go away    Currently in Pain? No/denies                               [facility]       Manual Therapy   Manual Therapy Manual Lymphatic Drainage (MLD);Compression Bandaging    Manual therapy comments Manual complete separate than rest of tx    Manual Lymphatic Drainage (MLD) decongestive techniques including supraclavicular, deep and superfical abdominal inguinal/axillary anastomosis and LE.  Manual completed B anterior supine; posteriorly done sidelying.    Compression Bandaging from toe to thigh B using 1/2" foam and multilayer short stretch bandaging.                       PT Short Term Goals - 12/14/20 1230       PT SHORT TERM GOAL #1   Title Pt to be I in HEP to improve lymphatic circulation    Baseline 10/19: reports compliance wiht HEP.    Status Achieved      PT SHORT TERM GOAL #2   Title PT to have lost 4 cm in measurement to allow pt to be  able to lift her leg and put it on the bed    Baseline 12/09/20:  See measurements.  Ability to get into bed and vehicle without difficulty.    Status Partially Met               PT Long Term Goals - 12/14/20 1230       PT LONG TERM GOAL #1   Title PT to have and be using a compression pump    Status Achieved      PT LONG TERM GOAL #2   Title PT to have lost 5-8 cm from measurement of thigh and LE not foot to reduce risk of cellulitis and make it easier to don compression garment    Status On-going      PT LONG TERM GOAL #3   Title PT to have and be donning a compression garment.    Status On-going                   Plan - 12/14/20 1229     Clinical Impression Statement Pt continues to not imporved mobility with noted decongestion in B LE>  PT has obtained and used pump with no questions.    Personal Factors and Comorbidities Fitness;Time since onset of injury/illness/exacerbation;Comorbidity 2    Comorbidities obesity, OA    Examination-Activity Limitations Bathing;Bed Mobility;Carry;Dressing;Lift;Locomotion  Level;Squat;Stairs;Stand;Transfers;Toileting    Examination-Participation Restrictions Church;Cleaning;Community Activity;Driving;Laundry;Shop;Yard Work    [employer]chant [employer] officer Evolving/Moderate complexity    Rehab Potential Good    PT Frequency 3x / week    PT Duration 6 weeks    PT Treatment/Interventions Therapeutic activities;Therapeutic exercise;Patient/family education;Manual techniques;Manual lymph drainage;Compression bandaging    PT Next Visit Plan Continue complete lymphedema treatment for bil LE's.  measure on wednesdays.    PT Home Exercise Plan ankle pumps, LAQ, ab/adduction, marching, diaphragmic breathing and lymph squeeze.    Consulted and Agree with Plan of Care Patient             Patient will benefit from skilled therapeutic intervention in order to improve the following deficits and impairments:  Abnormal gait, Cardiopulmonary status limiting activity, Decreased activity tolerance, Decreased balance, Decreased mobility, Decreased range of motion, Difficulty walking, Decreased strength, Decreased skin integrity, Increased edema, Obesity  Visit Diagnosis: Lymphedema, not elsewhere classified     Problem List Patient Active Problem List   Diagnosis Date Noted   Cellulitis 08/20/2020   Morbid obesity ([place]) 08/20/2020   Hypokalemia 08/20/2020   Cellulitis of right lower extremity 08/20/2020   AKI (acute kidney injury) ([place]) 08/20/2020   Hypertension    Essential hypertension 07/08/2015   Non Hodgkin's lymphoma ([place]) 02/21/2006   Rayetta Humphrey, PT CLT [phone]  12/14/2020, 12:32 PM  [facility] [address] [place], [place], [address] Phone: [phone]   Fax:  [phone]  Name: Allison Short MRN: [id number] Date of Birth: 1950/05/15

## 2020-12-16 ENCOUNTER — Telehealth (HOSPITAL_COMMUNITY): Payer: Self-pay | Admitting: Physical Therapy

## 2020-12-16 ENCOUNTER — Ambulatory Visit (HOSPITAL_COMMUNITY): Payer: Medicare HMO | Admitting: Physical Therapy

## 2020-12-16 ENCOUNTER — Other Ambulatory Visit: Payer: Self-pay

## 2020-12-16 DIAGNOSIS — I890 Lymphedema, not elsewhere classified: Secondary | ICD-10-CM

## 2020-12-16 NOTE — Therapy (Addendum)
[facility] [address] [place], [place], [address] Phone: [phone]   Fax:  [phone]  Physical Therapy Treatment Progress Note Reporting Period 11/04/2020 to 12/16/2020  See note below for Objective Data and Assessment of Progress/Goals.     Patient Details  Name: Allison Short MRN: [id number] Date of Birth: 22-Jul-1950 Referring Provider (PT): Sallee Provencal   Encounter Date: 12/16/2020   PT End of Session - 12/16/20 1252     Visit Number 13    Number of Visits 18    Date for PT Re-Evaluation 01/27/21    Authorization Type [employer] medicare-    Authorization Time Period no visit limit authorization required    Progress Note Due on Visit 23   PT Start Time 1055    PT Stop Time 1215    PT Time Calculation (min) 80 min    Activity Tolerance Patient tolerated treatment well    Behavior During Therapy [facility] for tasks assessed/performed             Past Medical History:  Diagnosis Date   History of blood transfusion    2016   History of urinary tract infection    Hypertension    Non Hodgkin's lymphoma ([place]) 2008   Obesity    Osteopenia 04/2017   T score -1.4 FRAX 7.5% / 0.8%   Pancreatitis     Past Surgical History:  Procedure Laterality Date   BIOPSY THYROID     BONE MARROW BIOPSY     CESAREAN SECTION  10/26/1975   CHOLECYSTECTOMY     2006   PORTA CATH INSERTION     TONSILLECTOMY AND ADENOIDECTOMY     1952   TOTAL KNEE ARTHROPLASTY Bilateral    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   Subjective Assessment - 12/16/20 1246     Subjective pt states she removed her bandaging last night but did not use her pump.  STates she does like using her pump though.  States she has 2 areas come up on both posterior shoulders that she has made an appointment for MD to look at.    Currently in Pain? No/denies                   LYMPHEDEMA/ONCOLOGY QUESTIONNAIRE - 12/16/20 1249        Lymphedema Assessments   Lymphedema Assessments Lower extremities      Right Lower Extremity Lymphedema   20 cm Proximal to Suprapatella 83 cm   11/04/20 initial eval was: 89   10 cm Proximal to Suprapatella 88 cm   11/04/20 initial eval was: 88   At Midpatella/Popliteal Crease 68 cm   11/04/20 initial eval was: 73.5   30 cm Proximal to Floor at Lateral Plantar Foot 66.5 cm   11/04/20 initial eval was: 73   20 cm Proximal to Floor at Lateral Plantar Foot 56 1   11/04/20 initial eval was: 62.3   10 cm Proximal to Floor at Lateral Malleoli 37 cm   11/04/20 initial eval was: 40.5   Circumference of ankle/heel 36 cm.   11/04/20 initial eval was: 38.5   5 cm Proximal to 1st MTP Joint 27.2 cm   11/04/20 initial eval was: 29   Across MTP Joint 27 cm   11/04/20 initial eval was: 28   Around Proximal Great Toe 10 cm   11/04/20 initial eval was: no measurement taken, has been 10cm x 2  weeks     Left Lower Extremity Lymphedema   20 cm Proximal to Suprapatella 83 cm   11/04/20 initial eval was: 89.2   10 cm Proximal to Suprapatella 83 cm   11/04/20 initial eval was: 82.4   At Midpatella/Popliteal Crease 68 cm   11/04/20 initial eval was: 72.8   30 cm Proximal to Floor at Lateral Plantar Foot 62 cm   11/04/20 initial eval was: 64.1   20 cm Proximal to Floor at Lateral Plantar Foot 47 cm   11/04/20 initial eval was: 50.9   10 cm Proximal to Floor at Lateral Malleoli 33.2 cm   11/04/20 initial eval was: 39.2   Circumference of ankle/heel 35.5 cm.   11/04/20 initial eval was: 37.7   5 cm Proximal to 1st MTP Joint 27 cm   11/04/20 initial eval was: 27   Across MTP Joint 26 cm   11/04/20 initial eval was: 27.2   Around Proximal Great Toe 8.7 cm   11/04/20 initial eval was: 8.8                       [facility] Adult PT Treatment/Exercise - 12/16/20 0001       Manual Therapy   Manual Therapy Other (comment);Compression Bandaging    Compression Bandaging from toe to thigh B using 1/2" foam and multilayer short  stretch bandaging.    Other Manual Therapy measurement for bil LE's                       PT Short Term Goals - 12/14/20 1230       PT SHORT TERM GOAL #1   Title Pt to be I in HEP to improve lymphatic circulation    Baseline 10/19: reports compliance wiht HEP.    Status Achieved      PT SHORT TERM GOAL #2   Title PT to have lost 4 cm in measurement to allow pt to be able to lift her leg and put it on the bed    Baseline 12/09/20:  See measurements.  Ability to get into bed and vehicle without difficulty.    Status Partially Met               PT Long Term Goals - 12/14/20 1230       PT LONG TERM GOAL #1   Title PT to have and be using a compression pump    Status Achieved      PT LONG TERM GOAL #2   Title PT to have lost 5-8 cm from measurement of thigh and LE not foot to reduce risk of cellulitis and make it easier to don compression garment    Status On-going      PT LONG TERM GOAL #3   Title PT to have and be donning a compression garment.    Status On-going                   Plan - 12/16/20 1253     Clinical Impression Statement Pt remeasured today with little change from last weeks measurements.  Went ahead and sent order to MD for custom thigh high garments.  Will need to fax these to [facility] when received.  Pt will continue to benefit from MLD until obtains garments.  Instructed to continue using pump when removes garments and progressing her mobility.  STanding tolerance and gait has improved significanlty since beginning therapy.  Was unable to complete MLD  this session due to time constraints with measurements for bil LE's.    Personal Factors and Comorbidities Fitness;Time since onset of injury/illness/exacerbation;Comorbidity 2    Comorbidities obesity, OA    Examination-Activity Limitations Bathing;Bed Mobility;Carry;Dressing;Lift;Locomotion Level;Squat;Stairs;Stand;Transfers;Toileting    Examination-Participation Restrictions  Church;Cleaning;Community Activity;Driving;Laundry;Shop;Yard Work    [employer]chant [employer] officer Evolving/Moderate complexity    Rehab Potential Good    PT Frequency 3x / week    PT Duration 6 weeks    PT Treatment/Interventions Therapeutic activities;Therapeutic exercise;Patient/family education;Manual techniques;Manual lymph drainage;Compression bandaging    PT Next Visit Plan Continue complete lymphedema treatment for bil LE's.  measure on wednesdays.  Fax order to [facility] when received from MD for custom thigh high garments.    PT Home Exercise Plan ankle pumps, LAQ, ab/adduction, marching, diaphragmic breathing and lymph squeeze.    Consulted and Agree with Plan of Care Patient             Patient will benefit from skilled therapeutic intervention in order to improve the following deficits and impairments:  Abnormal gait, Cardiopulmonary status limiting activity, Decreased activity tolerance, Decreased balance, Decreased mobility, Decreased range of motion, Difficulty walking, Decreased strength, Decreased skin integrity, Increased edema, Obesity  Visit Diagnosis: Lymphedema, not elsewhere classified     Problem List Patient Active Problem List   Diagnosis Date Noted   Cellulitis 08/20/2020   Morbid obesity ([place]) 08/20/2020   Hypokalemia 08/20/2020   Cellulitis of right lower extremity 08/20/2020   AKI (acute kidney injury) ([place]) 08/20/2020   Hypertension    Essential hypertension 07/08/2015   Non Hodgkin's lymphoma ([place]) 02/21/2006   Teena Irani, PTA/CLT, WTA [phone]  Teena Irani, PTA 12/16/2020, 12:56 PM  [facility] [address] [place], [place], [address] Phone: [phone]   Fax:  [phone]  Name: Allison Short MRN: [id number] Date of Birth: [birth year]

## 2020-12-16 NOTE — Telephone Encounter (Signed)
Order faxed to MD for custom thigh high garments.  Order will need to be faxed to [facility] or taken to pharmacy when returned  Teena Irani, PTA/CLT, [place] [phone]

## 2020-12-18 ENCOUNTER — Telehealth (HOSPITAL_COMMUNITY): Payer: Self-pay | Admitting: Physical Therapy

## 2020-12-18 ENCOUNTER — Ambulatory Visit (HOSPITAL_COMMUNITY): Payer: Medicare HMO | Admitting: Physical Therapy

## 2020-12-18 NOTE — Telephone Encounter (Signed)
Her ride cx on her this morning- cx lack of transportation

## 2020-12-21 ENCOUNTER — Other Ambulatory Visit: Payer: Self-pay

## 2020-12-21 ENCOUNTER — Ambulatory Visit (HOSPITAL_COMMUNITY): Payer: Medicare HMO | Admitting: Physical Therapy

## 2020-12-21 DIAGNOSIS — I890 Lymphedema, not elsewhere classified: Secondary | ICD-10-CM

## 2020-12-21 NOTE — Therapy (Signed)
[facility] [address] [place], [place], [address] Phone: [phone]   Fax:  [phone]  Physical Therapy Treatment  Patient Details  Name: Allison Short MRN: [id number] Date of Birth: [birth year] Referring Provider (PT): Sallee Provencal   Encounter Date: 12/21/2020   PT End of Session - 12/21/20 1154     Visit Number 14    Number of Visits 18    Date for PT Re-Evaluation 12/16/20    Authorization Type [employer] medicare-    Authorization Time Period no visit limit authorization required    Progress Note Due on Visit 20    PT Start Time 0830    PT Stop Time 0915    PT Time Calculation (min) 45 min    Activity Tolerance Patient tolerated treatment well    Behavior During Therapy [facility] for tasks assessed/performed             Past Medical History:  Diagnosis Date   History of blood transfusion    2016   History of urinary tract infection    Hypertension    Non Hodgkin's lymphoma ([place]) 2008   Obesity    Osteopenia 04/2017   T score -1.4 FRAX 7.5% / 0.8%   Pancreatitis     Past Surgical History:  Procedure Laterality Date   BIOPSY THYROID     BONE MARROW BIOPSY     CESAREAN SECTION  10/26/1975   CHOLECYSTECTOMY     2006   PORTA CATH INSERTION     TONSILLECTOMY AND ADENOIDECTOMY     1952   TOTAL KNEE ARTHROPLASTY Bilateral    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   Subjective Assessment - 12/21/20 1153     Subjective Pt reports compliance with exercises and using her pump.  STates she cancelled her Wednesday appt since she is coming Thursday.    Currently in Pain? No/denies                               [facility] Adult PT Treatment/Exercise - 12/21/20 0001       Manual Therapy   Manual Therapy Compression Bandaging    Compression Bandaging from toe to thigh B using 1/2" foam and multilayer short stretch bandaging.                       PT Short Term  Goals - 12/14/20 1230       PT SHORT TERM GOAL #1   Title Pt to be I in HEP to improve lymphatic circulation    Baseline 10/19: reports compliance wiht HEP.    Status Achieved      PT SHORT TERM GOAL #2   Title PT to have lost 4 cm in measurement to allow pt to be able to lift her leg and put it on the bed    Baseline 12/09/20:  See measurements.  Ability to get into bed and vehicle without difficulty.    Status Partially Met               PT Long Term Goals - 12/14/20 1230       PT LONG TERM GOAL #1   Title PT to have and be using a compression pump    Status Achieved      PT LONG TERM GOAL #2   Title PT to have lost 5-8  cm from measurement of thigh and LE not foot to reduce risk of cellulitis and make it easier to don compression garment    Status On-going      PT LONG TERM GOAL #3   Title PT to have and be donning a compression garment.    Status On-going                   Plan - 12/21/20 1156     Clinical Impression Statement Pt only scheduled for 45 minute session today.  Completed compression bandaging only toe to thigh.  Pt reported overall comfort following.  Pt with questions regarding pump stating it hurts her Rt knee where it is bowed so badly.  Pt questioning if she could lessen the compression a little as suggested by Erlene Quan and stated that would be fine.  Pt intends on calling Erlene Quan back for him to step her through the process.  Order received back from MD for custom garments and faxed to [employer].    Personal Factors and Comorbidities Fitness;Time since onset of injury/illness/exacerbation;Comorbidity 2    Comorbidities obesity, OA    Examination-Activity Limitations Bathing;Bed Mobility;Carry;Dressing;Lift;Locomotion Level;Squat;Stairs;Stand;Transfers;Toileting    Examination-Participation Restrictions Church;Cleaning;Community Activity;Driving;Laundry;Shop;Yard Work    Merchant navy officer Evolving/Moderate complexity    Rehab  Potential Good    PT Frequency 3x / week    PT Duration 6 weeks    PT Treatment/Interventions Therapeutic activities;Therapeutic exercise;Patient/family education;Manual techniques;Manual lymph drainage;Compression bandaging    PT Next Visit Plan Continue complete lymphedema treatment for bil LE's.  measure on wednesdays.  Follow up on contact from [employer] for custom thigh high garments.    PT Home Exercise Plan ankle pumps, LAQ, ab/adduction, marching, diaphragmic breathing and lymph squeeze.    Consulted and Agree with Plan of Care Patient             Patient will benefit from skilled therapeutic intervention in order to improve the following deficits and impairments:  Abnormal gait, Cardiopulmonary status limiting activity, Decreased activity tolerance, Decreased balance, Decreased mobility, Decreased range of motion, Difficulty walking, Decreased strength, Decreased skin integrity, Increased edema, Obesity  Visit Diagnosis: Lymphedema, not elsewhere classified     Problem List Patient Active Problem List   Diagnosis Date Noted   Cellulitis 08/20/2020   Morbid obesity ([place]) 08/20/2020   Hypokalemia 08/20/2020   Cellulitis of right lower extremity 08/20/2020   AKI (acute kidney injury) ([place]) 08/20/2020   Hypertension    Essential hypertension 07/08/2015   Non Hodgkin's lymphoma ([place]) 02/21/2006    Teena Irani, PTA 12/21/2020, 11:57 AM  [facility] [address] [place], [place], [address] Phone: [phone]   Fax:  [phone]  Name: Dorri Ozturk MRN: [id number] Date of Birth: [birth year]

## 2020-12-22 ENCOUNTER — Encounter (HOSPITAL_COMMUNITY): Payer: Medicare HMO | Admitting: Physical Therapy

## 2020-12-23 ENCOUNTER — Encounter (HOSPITAL_COMMUNITY): Payer: Self-pay

## 2020-12-24 ENCOUNTER — Encounter (HOSPITAL_COMMUNITY): Payer: Medicare HMO | Admitting: Physical Therapy

## 2020-12-28 ENCOUNTER — Other Ambulatory Visit: Payer: Self-pay

## 2020-12-28 ENCOUNTER — Ambulatory Visit (HOSPITAL_COMMUNITY): Payer: Medicare HMO | Attending: Adult Health | Admitting: Physical Therapy

## 2020-12-28 DIAGNOSIS — I890 Lymphedema, not elsewhere classified: Secondary | ICD-10-CM | POA: Insufficient documentation

## 2020-12-28 NOTE — Therapy (Signed)
[facility] [address] [place], [place], [address] Phone: [phone]   Fax:  [phone]  Physical Therapy Treatment  Patient Details  Name: Allison Short MRN: [id number] Date of Birth: 06/24/1950 Referring Provider (PT): Corey Nafziger   Encounter Date: 12/28/2020   PT End of Session - 12/28/20 1614     Visit Number 15    Number of Visits 18    Date for PT Re-Evaluation 01/27/21    Authorization Type [employer] medicare-    Authorization Time Period no visit limit authorization required; progress note completed visit 13    Progress Note Due on Visit 23    PT Start Time 0835    PT Stop Time 1000    PT Time Calculation (min) 85 min    Activity Tolerance Patient tolerated treatment well    Behavior During Therapy [facility] for tasks assessed/performed             Past Medical History:  Diagnosis Date   History of blood transfusion    2016   History of urinary tract infection    Hypertension    Non Hodgkin's lymphoma ([facility]) 2008   Obesity    Osteopenia 04/2017   T score -1.4 FRAX 7.5% / 0.8%   Pancreatitis     Past Surgical History:  Procedure Laterality Date   BIOPSY THYROID     BONE MARROW BIOPSY     CESAREAN SECTION  10/26/1975   CHOLECYSTECTOMY     2006   PORTA CATH INSERTION     TONSILLECTOMY AND ADENOIDECTOMY     1952   TOTAL KNEE ARTHROPLASTY Bilateral    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   Subjective Assessment - 12/28/20 1130     Subjective Pt states she did not make it last week because she ran a slight fever one day and then did not feel good the rest of the week.  States she used her pump but did not have any compression to don.  Pt state she has to sleep in her recliner when we wrap her.    Currently in Pain? No/denies                               [facility] Adult PT Treatment/Exercise - 12/28/20 0001       Manual Therapy   Manual Therapy Compression  Bandaging;Manual Lymphatic Drainage (MLD)    Manual therapy comments Manual complete separate than rest of tx    Manual Lymphatic Drainage (MLD) decongestive techniques including supraclavicular, deep and superfical abdominal inguinal/axillary anastomosis and LE.  Manual completed B anterior supine; posteriorly done sidelying.    Compression Bandaging from toe to thigh B using 1/2" foam and multilayer short stretch bandaging.                       PT Short Term Goals - 12/14/20 1230       PT SHORT TERM GOAL #1   Title Pt to be I in HEP to improve lymphatic circulation    Baseline 10/19: reports compliance wiht HEP.    Status Achieved      PT SHORT TERM GOAL #2   Title PT to have lost 4 cm in measurement to allow pt to be able to lift her leg and put it on the bed    Baseline 12/09/20:  See   measurements.  Ability to get into bed and vehicle without difficulty.    Status Partially Met               PT Long Term Goals - 12/14/20 1230       PT LONG TERM GOAL #1   Title PT to have and be using a compression pump    Status Achieved      PT LONG TERM GOAL #2   Title PT to have lost 5-8 cm from measurement of thigh and LE not foot to reduce risk of cellulitis and make it easier to don compression garment    Status On-going      PT LONG TERM GOAL #3   Title PT to have and be donning a compression garment.    Status On-going                   Plan - 12/28/20 1611     Clinical Impression Statement MLD and compression dressing completed to bilateral LE's.  pt repors she has not heard from [facility]; therapist will follow up. Noted increase in induration due to week missed without wearing compression.  Educated on importance of manual and compression in order for treatment to work.  Pt verbalized understanding.    Personal Factors and Comorbidities Fitness;Time since onset of injury/illness/exacerbation;Comorbidity 2    Comorbidities obesity, OA     Examination-Activity Limitations Bathing;Bed Mobility;Carry;Dressing;Lift;Locomotion Level;Squat;Stairs;Stand;Transfers;Toileting    Examination-Participation Restrictions Church;Cleaning;Community Activity;Driving;Laundry;Shop;Yard Work    Merchant navy officer Evolving/Moderate complexity    Rehab Potential Good    PT Frequency 3x / week    PT Duration 6 weeks    PT Treatment/Interventions Therapeutic activities;Therapeutic exercise;Patient/family education;Manual techniques;Manual lymph drainage;Compression bandaging    PT Next Visit Plan Continue complete lymphedema treatment for bil LE's.  measure on wednesdays.  Follow up on contact from [employer] for custom thigh high garments.  update:  contact with [place] from [place] indicated that Ortencia Kick called pt on 11/4 and left a voicemail.  Give pt [employer] number and have her to call them.    PT Home Exercise Plan ankle pumps, LAQ, ab/adduction, marching, diaphragmic breathing and lymph squeeze.    Consulted and Agree with Plan of Care Patient             Patient will benefit from skilled therapeutic intervention in order to improve the following deficits and impairments:  Abnormal gait, Cardiopulmonary status limiting activity, Decreased activity tolerance, Decreased balance, Decreased mobility, Decreased range of motion, Difficulty walking, Decreased strength, Decreased skin integrity, Increased edema, Obesity  Visit Diagnosis: Lymphedema, not elsewhere classified     Problem List Patient Active Problem List   Diagnosis Date Noted   Cellulitis 08/20/2020   Morbid obesity ([place]) 08/20/2020   Hypokalemia 08/20/2020   Cellulitis of right lower extremity 08/20/2020   AKI (acute kidney injury) ([place]) 08/20/2020   Hypertension    Essential hypertension 07/08/2015   Non Hodgkin's lymphoma ([place]) 02/21/2006   Teena Irani, PTA/CLT, WTA [phone]  Teena Irani, PTA 12/28/2020, 4:17 PM  [facility] [address] [place], [place], [address] Phone: [phone]   Fax:  [phone]  Name: Jestine Bicknell MRN: [id number] Date of Birth: 1950-07-21

## 2020-12-29 ENCOUNTER — Ambulatory Visit (HOSPITAL_COMMUNITY): Payer: Medicare HMO | Admitting: Physical Therapy

## 2020-12-29 DIAGNOSIS — I890 Lymphedema, not elsewhere classified: Secondary | ICD-10-CM

## 2020-12-29 NOTE — Therapy (Signed)
[facility] [address] [place], [place], [address] Phone: [phone]   Fax:  [phone]  Physical Therapy Treatment  Patient Details  Name: Allison Short MRN: [id number] Date of Birth: 1950-08-03 Referring Provider (PT): Sallee Provencal   Encounter Date: 12/29/2020   PT End of Session - 12/29/20 1543     Visit Number 16    Number of Visits 23    Date for PT Re-Evaluation 01/27/21    Authorization Type [employer] medicare-    Authorization Time Period no visit limit authorization required; progress note completed visit 13    Progress Note Due on Visit 23    PT Start Time 1320    PT Stop Time 1443    PT Time Calculation (min) 83 min    Activity Tolerance Patient tolerated treatment well    Behavior During Therapy [facility] for tasks assessed/performed             Past Medical History:  Diagnosis Date   History of blood transfusion    2016   History of urinary tract infection    Hypertension    Non Hodgkin's lymphoma ([place]) 2008   Obesity    Osteopenia 04/2017   T score -1.4 FRAX 7.5% / 0.8%   Pancreatitis     Past Surgical History:  Procedure Laterality Date   BIOPSY THYROID     BONE MARROW BIOPSY     CESAREAN SECTION  10/26/1975   CHOLECYSTECTOMY     2006   PORTA CATH INSERTION     TONSILLECTOMY AND ADENOIDECTOMY     1952   TOTAL KNEE ARTHROPLASTY Bilateral    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   Subjective Assessment - 12/29/20 1536     Subjective Pt states she is doing well today.  STates she spoke to the garment guy and he is able to come to her for measurements but is going to call one of the therapists to discuss    Currently in Pain? No/denies                   LYMPHEDEMA/ONCOLOGY QUESTIONNAIRE - 12/29/20 1538       Lymphedema Assessments   Lymphedema Assessments Lower extremities      Right Lower Extremity Lymphedema   20 cm Proximal to Suprapatella 83 cm   11/04/20  initial eval was: 89   10 cm Proximal to Suprapatella 88 cm   11/04/20 initial eval was: 88   At Midpatella/Popliteal Crease 68 cm   11/04/20 initial eval was: 73.5   30 cm Proximal to Floor at Lateral Plantar Foot 62 cm   11/04/20 initial eval was: 73   20 cm Proximal to Floor at Lateral Plantar Foot 53 1   11/04/20 initial eval was: 62.3   10 cm Proximal to Floor at Lateral Malleoli 38 cm   11/04/20 initial eval was: 40.5   Circumference of ankle/heel 36 cm.   11/04/20 initial eval was: 38.5   5 cm Proximal to 1st MTP Joint 27.5 cm   11/04/20 initial eval was: 29   Across MTP Joint 27.4 cm   11/04/20 initial eval was: 28   Around Proximal Great Toe 10 cm   11/04/20 initial eval was: no measurement taken, has been 10cm x 2 weeks     Left Lower Extremity Lymphedema   20 cm Proximal to Suprapatella 83 cm   11/04/20 initial eval was: 89.2  10 cm Proximal to Suprapatella 83 cm   11/04/20 initial eval was: 82.4   At Midpatella/Popliteal Crease 68 cm   11/04/20 initial eval was: 72.8   30 cm Proximal to Floor at Lateral Plantar Foot 63 cm   11/04/20 initial eval was: 64.1   20 cm Proximal to Floor at Lateral Plantar Foot 45.5 cm   11/04/20 initial eval was: 50.9   10 cm Proximal to Floor at Lateral Malleoli 35.8 cm   11/04/20 initial eval was: 39.2   Circumference of ankle/heel 37.7 cm.   11/04/20 initial eval was: 37.7   5 cm Proximal to 1st MTP Joint 26.5 cm   11/04/20 initial eval was: 27   Across MTP Joint 25.6 cm   11/04/20 initial eval was: 27.2   Around Proximal Great Toe 8.8 cm   11/04/20 initial eval was: 8.8                       [facility] Adult PT Treatment/Exercise - 12/29/20 0001       Manual Therapy   Manual Therapy Compression Bandaging;Manual Lymphatic Drainage (MLD)    Manual therapy comments Manual complete separate than rest of tx    Manual Lymphatic Drainage (MLD) decongestive techniques including supraclavicular, deep and superfical abdominal inguinal/axillary anastomosis and  LE.  Manual completed B anterior supine; posteriorly done sidelying.    Compression Bandaging from toe to thigh B using 1/2" foam and multilayer short stretch bandaging.    Other Manual Therapy measurement for bil LE's                       PT Short Term Goals - 12/14/20 1230       PT SHORT TERM GOAL #1   Title Pt to be I in HEP to improve lymphatic circulation    Baseline 10/19: reports compliance wiht HEP.    Status Achieved      PT SHORT TERM GOAL #2   Title PT to have lost 4 cm in measurement to allow pt to be able to lift her leg and put it on the bed    Baseline 12/09/20:  See measurements.  Ability to get into bed and vehicle without difficulty.    Status Partially Met               PT Long Term Goals - 12/14/20 1230       PT LONG TERM GOAL #1   Title PT to have and be using a compression pump    Status Achieved      PT LONG TERM GOAL #2   Title PT to have lost 5-8 cm from measurement of thigh and LE not foot to reduce risk of cellulitis and make it easier to don compression garment    Status On-going      PT LONG TERM GOAL #3   Title PT to have and be donning a compression garment.    Status On-going                   Plan - 12/29/20 1543     Clinical Impression Statement LE's measured this session with most reduction noted at 20cm from floor in distal LE's. continued with manual lymph drainage and compression bandaging to full thighs.  NOted reduction in induration as compared to last visit.    Personal Factors and Comorbidities Fitness;Time since onset of injury/illness/exacerbation;Comorbidity 2    Comorbidities obesity, OA  Examination-Activity Limitations Bathing;Bed Mobility;Carry;Dressing;Lift;Locomotion Level;Squat;Stairs;Stand;Transfers;Toileting    Examination-Participation Restrictions Church;Cleaning;Community Activity;Driving;Laundry;Shop;Yard Work    Merchant navy officer Evolving/Moderate complexity     Rehab Potential Good    PT Frequency 3x / week    PT Duration 6 weeks    PT Treatment/Interventions Therapeutic activities;Therapeutic exercise;Patient/family education;Manual techniques;Manual lymph drainage;Compression bandaging    PT Next Visit Plan Continue complete lymphedema treatment for bil LE's.  measure on wednesdays.  Follow up on contact from [employer] for custom thigh high garments vs capris/knee highs.  update:  contact with [place] from [place] indicated that Ortencia Kick called pt on 11/4 and left a voicemail.  Give pt [employer] number and have her to call them.    PT Home Exercise Plan ankle pumps, LAQ, ab/adduction, marching, diaphragmic breathing and lymph squeeze.    Consulted and Agree with Plan of Care Patient             Patient will benefit from skilled therapeutic intervention in order to improve the following deficits and impairments:  Abnormal gait, Cardiopulmonary status limiting activity, Decreased activity tolerance, Decreased balance, Decreased mobility, Decreased range of motion, Difficulty walking, Decreased strength, Decreased skin integrity, Increased edema, Obesity  Visit Diagnosis: Lymphedema, not elsewhere classified     Problem List Patient Active Problem List   Diagnosis Date Noted   Cellulitis 08/20/2020   Morbid obesity ([place]) 08/20/2020   Hypokalemia 08/20/2020   Cellulitis of right lower extremity 08/20/2020   AKI (acute kidney injury) ([place]) 08/20/2020   Hypertension    Essential hypertension 07/08/2015   Non Hodgkin's lymphoma ([place]) 02/21/2006   Teena Irani, PTA/CLT, WTA [phone]  Teena Irani, PTA 12/29/2020, 4:40 PM  [facility] [address] [place], [place], [address] Phone: [phone]   Fax:  [phone]  Name: Allison Short MRN: [id number] Date of Birth: 02/19/1951

## 2020-12-31 ENCOUNTER — Other Ambulatory Visit: Payer: Self-pay

## 2020-12-31 ENCOUNTER — Ambulatory Visit (HOSPITAL_COMMUNITY): Payer: Medicare HMO | Admitting: Physical Therapy

## 2020-12-31 DIAGNOSIS — I890 Lymphedema, not elsewhere classified: Secondary | ICD-10-CM | POA: Diagnosis not present

## 2020-12-31 NOTE — Therapy (Signed)
[facility] [address] [place], [place], [address] Phone: [phone]   Fax:  [phone]  Physical Therapy Treatment  Patient Details  Name: Allison Short MRN: [id number] Date of Birth: Jul 08, 1950 Referring Provider (PT): Sallee Provencal   Encounter Date: 12/31/2020   PT End of Session - 12/31/20 1339     Visit Number 17    Number of Visits 23    Date for PT Re-Evaluation 01/27/21    Authorization Type [employer] medicare-    Authorization Time Period no visit limit authorization required; progress note completed visit 13    Progress Note Due on Visit 23    PT Start Time 1335   pt late   PT Stop Time 1445    PT Time Calculation (min) 70 min    Activity Tolerance Patient tolerated treatment well    Behavior During Therapy [facility] for tasks assessed/performed             Past Medical History:  Diagnosis Date   History of blood transfusion    2016   History of urinary tract infection    Hypertension    Non Hodgkin's lymphoma ([place]) 2008   Obesity    Osteopenia 04/2017   T score -1.4 FRAX 7.5% / 0.8%   Pancreatitis     Past Surgical History:  Procedure Laterality Date   BIOPSY THYROID     BONE MARROW BIOPSY     CESAREAN SECTION  10/26/1975   CHOLECYSTECTOMY     2006   PORTA CATH INSERTION     TONSILLECTOMY AND ADENOIDECTOMY     1952   TOTAL KNEE ARTHROPLASTY Bilateral    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   Subjective Assessment - 12/31/20 1337     Subjective Pt is doing well with her exercises    Pertinent History B TKR, HTN, non hodgkins lymphoma    Patient Stated Goals make it go away    Currently in Pain? No/denies                               [facility] Adult PT Treatment/Exercise - 12/31/20 0001       Manual Therapy   Manual therapy comments Manual complete separate than rest of tx    Manual Lymphatic Drainage (MLD) decongestive techniques including  supraclavicular, deep and superfical abdominal inguinal/axillary anastomosis and LE.  Manual completed B anterior supine.   Compression Bandaging from toe to thigh B using 1/2" foam and multilayer short stretch bandaging.                       PT Short Term Goals - 12/14/20 1230       PT SHORT TERM GOAL #1   Title Pt to be I in HEP to improve lymphatic circulation    Baseline 10/19: reports compliance wiht HEP.    Status Achieved      PT SHORT TERM GOAL #2   Title PT to have lost 4 cm in measurement to allow pt to be able to lift her leg and put it on the bed    Baseline 12/09/20:  See measurements.  Ability to get into bed and vehicle without difficulty.    Status Partially Met               PT Long Term Goals - 12/14/20 1230  PT LONG TERM GOAL #1   Title PT to have and be using a compression pump    Status Achieved      PT LONG TERM GOAL #2   Title PT to have lost 5-8 cm from measurement of thigh and LE not foot to reduce risk of cellulitis and make it easier to don compression garment    Status On-going      PT LONG TERM GOAL #3   Title PT to have and be donning a compression garment.    Status On-going                   Plan - 12/31/20 1450     Clinical Impression Statement PT manual completed anterior only today due to pt being 20 minutes late secondary to [employer] being late.  Pt states she is completing her exercises and able to walk longer distances at this time. [employer] has contacted pt but pt could not answer their questions states they will be calling the clinic.    Personal Factors and Comorbidities Fitness;Time since onset of injury/illness/exacerbation;Comorbidity 2    Comorbidities obesity, OA    Examination-Activity Limitations Bathing;Bed Mobility;Carry;Dressing;Lift;Locomotion Level;Squat;Stairs;Stand;Transfers;Toileting    Examination-Participation Restrictions Church;Cleaning;Community  Activity;Driving;Laundry;Shop;Yard Work    Merchant navy officer Evolving/Moderate complexity    Rehab Potential Good    PT Frequency 3x / week    PT Duration 6 weeks    PT Treatment/Interventions Therapeutic activities;Therapeutic exercise;Patient/family education;Manual techniques;Manual lymph drainage;Compression bandaging    PT Next Visit Plan Continue complete lymphedema treatment for bil LE's.  measure on wednesdays.    PT Home Exercise Plan ankle pumps, LAQ, ab/adduction, marching, diaphragmic breathing and lymph squeeze.    Consulted and Agree with Plan of Care Patient             Patient will benefit from skilled therapeutic intervention in order to improve the following deficits and impairments:  Abnormal gait, Cardiopulmonary status limiting activity, Decreased activity tolerance, Decreased balance, Decreased mobility, Decreased range of motion, Difficulty walking, Decreased strength, Decreased skin integrity, Increased edema, Obesity  Visit Diagnosis: Lymphedema, not elsewhere classified     Problem List Patient Active Problem List   Diagnosis Date Noted   Cellulitis 08/20/2020   Morbid obesity ([place]) 08/20/2020   Hypokalemia 08/20/2020   Cellulitis of right lower extremity 08/20/2020   AKI (acute kidney injury) ([place]) 08/20/2020   Hypertension    Essential hypertension 07/08/2015   Non Hodgkin's lymphoma ([place]) 02/21/2006   Rayetta Humphrey, PT CLT [phone]  12/31/2020, 2:53 PM  [facility] [address] [place], [place], [address] Phone: [phone]   Fax:  [phone]  Name: Allison Short MRN: [id number] Date of Birth: 01-27-1951

## 2021-01-01 NOTE — Addendum Note (Signed)
Addended by: Leeroy Cha on: 01/01/2021 04:50 PM   Modules accepted: Orders

## 2021-01-05 ENCOUNTER — Ambulatory Visit (HOSPITAL_COMMUNITY): Payer: Medicare HMO | Admitting: Physical Therapy

## 2021-01-06 ENCOUNTER — Ambulatory Visit: Payer: Medicare HMO | Admitting: Adult Health

## 2021-01-07 ENCOUNTER — Ambulatory Visit (HOSPITAL_COMMUNITY): Payer: Medicare HMO | Admitting: Physical Therapy

## 2021-01-08 ENCOUNTER — Ambulatory Visit (HOSPITAL_COMMUNITY): Payer: Medicare HMO

## 2021-01-08 ENCOUNTER — Telehealth (HOSPITAL_COMMUNITY): Payer: Self-pay

## 2021-01-08 NOTE — Telephone Encounter (Signed)
Contacted pt concerning need for 3x/week for maximal lymphedema care.  Pt has cancelled apt for all this week due to moving.  Cancelled all remaining apts.  Encouraged pt to continue use of pump, exercises and to return when able to attend more regularly.  Ihor Austin, LPTA/CLT; Delana Meyer [phone]

## 2021-01-11 ENCOUNTER — Ambulatory Visit (HOSPITAL_COMMUNITY): Payer: Medicare HMO

## 2021-01-11 ENCOUNTER — Encounter (HOSPITAL_COMMUNITY): Payer: Self-pay | Admitting: Physical Therapy

## 2021-01-11 NOTE — Therapy (Unsigned)
[place] [facility] [place], [place], [address] Phone: [phone]   Fax:  [phone]  Patient Details  Name: Allison Short MRN: [id number] Date of Birth: [birth year] Referring Provider:  No ref. provider found  Encounter Date: 01/11/2021 PHYSICAL THERAPY DISCHARGE SUMMARY  Visits from Start of Care: 17  Current functional level related to goals / functional outcomes: No significant changes due to inconsistency in coming to therapy   Remaining deficits: edema   Education / Equipment: Use of pumpl    Patient agrees to discharge. Patient goals were not met. Patient is being discharged due to lack of progress due to inconsistency of treatments.  Therapist explained that when pt could come on a regular basis we would be happy to see her but at this time she is cancelling to often to notice any improvement.   PT has not been seen since 11.10.2022.  Right Lower Extremity Lymphedema     20 cm Proximal to Suprapatella 83 cm   11/04/20 initial eval was: 89    10 cm Proximal to Suprapatella 88 cm   11/04/20 initial eval was: 88    At Midpatella/Popliteal Crease 68 cm   11/04/20 initial eval was: 73.5    30 cm Proximal to Floor at Lateral Plantar Foot 62 cm   11/04/20 initial eval was: 73    20 cm Proximal to Floor at Lateral Plantar Foot 53 1   11/04/20 initial eval was: 62.3    10 cm Proximal to Floor at Lateral Malleoli 38 cm   11/04/20 initial eval was: 40.5    Circumference of ankle/heel 36 cm.   11/04/20 initial eval was: 38.5    5 cm Proximal to 1st MTP Joint 27.5 cm   11/04/20 initial eval was: 29    Across MTP Joint 27.4 cm   11/04/20 initial eval was: 28    Around Proximal Great Toe 10 cm   11/04/20 initial eval was: no measurement taken, has been 10cm x 2 weeks         Left Lower Extremity Lymphedema    20 cm Proximal to Suprapatella 83 cm   11/04/20 initial eval was: 89.2    10 cm Proximal to Suprapatella 83 cm   11/04/20 initial eval was: 82.4     At Midpatella/Popliteal Crease 68 cm   11/04/20 initial eval was: 72.8    30 cm Proximal to Floor at Lateral Plantar Foot 63 cm   11/04/20 initial eval was: 64.1    20 cm Proximal to Floor at Lateral Plantar Foot 45.5 cm   11/04/20 initial eval was: 50.9    10 cm Proximal to Floor at Lateral Malleoli 35.8 cm   11/04/20 initial eval was: 39.2    Circumference of ankle/heel 37.7 cm.   11/04/20 initial eval was: 37.7    5 cm Proximal to 1st MTP Joint 26.5 cm   11/04/20 initial eval was: 27    Across MTP Joint 25.6 cm   11/04/20 initial eval was: 27.2    Around Proximal Great Toe 8.8 cm   11/04/20 initial eval was: 8.8      Rayetta Humphrey, [place] CLT [phone] 01/11/2021, 12:06 PM  [place], [place], [address] Phone: [phone]   Fax:  [phone]

## 2021-01-12 ENCOUNTER — Encounter (HOSPITAL_COMMUNITY): Payer: Medicare HMO | Admitting: Physical Therapy

## 2021-01-13 ENCOUNTER — Ambulatory Visit (HOSPITAL_COMMUNITY): Payer: Medicare HMO | Admitting: Physical Therapy

## 2021-01-18 ENCOUNTER — Ambulatory Visit (HOSPITAL_COMMUNITY): Payer: Medicare HMO

## 2021-01-19 ENCOUNTER — Encounter (HOSPITAL_COMMUNITY): Payer: Medicare HMO | Admitting: Physical Therapy

## 2021-01-20 ENCOUNTER — Encounter (HOSPITAL_COMMUNITY): Payer: Self-pay

## 2021-01-21 ENCOUNTER — Encounter (HOSPITAL_COMMUNITY): Payer: Medicare HMO | Admitting: Physical Therapy

## 2021-01-22 ENCOUNTER — Ambulatory Visit (HOSPITAL_COMMUNITY): Payer: Medicare HMO | Admitting: Physical Therapy

## 2021-01-25 ENCOUNTER — Encounter (HOSPITAL_COMMUNITY): Payer: Medicare HMO | Admitting: Physical Therapy

## 2021-01-26 ENCOUNTER — Encounter (HOSPITAL_COMMUNITY): Payer: Medicare HMO | Admitting: Physical Therapy

## 2021-01-27 ENCOUNTER — Ambulatory Visit (HOSPITAL_COMMUNITY): Payer: Medicare HMO | Admitting: Physical Therapy

## 2021-01-28 ENCOUNTER — Encounter (HOSPITAL_COMMUNITY): Payer: Medicare HMO | Admitting: Physical Therapy

## 2021-01-29 ENCOUNTER — Encounter (HOSPITAL_COMMUNITY): Payer: Medicare HMO

## 2021-02-01 ENCOUNTER — Encounter (HOSPITAL_COMMUNITY): Payer: Medicare HMO | Admitting: Physical Therapy

## 2021-02-02 ENCOUNTER — Encounter (HOSPITAL_COMMUNITY): Payer: Medicare HMO | Admitting: Physical Therapy

## 2021-02-03 ENCOUNTER — Encounter (HOSPITAL_COMMUNITY): Payer: Medicare HMO | Admitting: Physical Therapy

## 2021-02-04 ENCOUNTER — Encounter (HOSPITAL_COMMUNITY): Payer: Medicare HMO | Admitting: Physical Therapy

## 2021-02-05 ENCOUNTER — Encounter (HOSPITAL_COMMUNITY): Payer: Medicare HMO

## 2021-02-05 ENCOUNTER — Telehealth: Payer: Self-pay | Admitting: Adult Health

## 2021-02-05 DIAGNOSIS — I10 Essential (primary) hypertension: Secondary | ICD-10-CM

## 2021-02-05 NOTE — Telephone Encounter (Signed)
Pt is overdue for a CPE

## 2021-02-08 ENCOUNTER — Encounter (HOSPITAL_COMMUNITY): Payer: Medicare HMO | Admitting: Physical Therapy

## 2021-02-08 NOTE — Telephone Encounter (Signed)
Pt has sch for cpe 03-03-2020 and would like refills

## 2021-02-09 ENCOUNTER — Encounter (HOSPITAL_COMMUNITY): Payer: Medicare HMO | Admitting: Physical Therapy

## 2021-02-09 MED ORDER — BENAZEPRIL-HYDROCHLOROTHIAZIDE 20-25 MG PO TABS: 1.0000 | ORAL_TABLET | Freq: Every day | ORAL | 0 refills | Status: DC

## 2021-02-09 MED ORDER — METOPROLOL TARTRATE 50 MG PO TABS: 50.0000 mg | ORAL_TABLET | Freq: Two times a day (BID) | ORAL | 0 refills | Status: DC

## 2021-02-09 NOTE — Addendum Note (Signed)
Addended by: Anderson Malta on: 02/09/2021 04:12 PM   Modules accepted: Orders

## 2021-02-09 NOTE — Telephone Encounter (Signed)
Patient called because she is going to run out of medication before she has her CPE in January. Patient is needing a temporary refill on both benazepril-hydrochlorthiazide (LOTENSIN HCT) 20-25 MG tablet  and  metoprolol tartrate (LOPRESSOR) 50 MG tablet    Please send to  [facility], [place] Phone:  [phone]  Fax:  [phone]           Please advise

## 2021-02-10 ENCOUNTER — Encounter (HOSPITAL_COMMUNITY): Payer: Medicare HMO | Admitting: Physical Therapy

## 2021-02-11 ENCOUNTER — Encounter (HOSPITAL_COMMUNITY): Payer: Medicare HMO

## 2021-02-12 ENCOUNTER — Encounter (HOSPITAL_COMMUNITY): Payer: Medicare HMO

## 2021-02-16 ENCOUNTER — Encounter (HOSPITAL_COMMUNITY): Payer: Medicare HMO | Admitting: Physical Therapy

## 2021-02-17 ENCOUNTER — Encounter (HOSPITAL_COMMUNITY): Payer: Medicare HMO

## 2021-02-18 ENCOUNTER — Encounter (HOSPITAL_COMMUNITY): Payer: Medicare HMO

## 2021-02-19 ENCOUNTER — Encounter (HOSPITAL_COMMUNITY): Payer: Medicare HMO | Admitting: Physical Therapy

## 2021-02-23 ENCOUNTER — Encounter (HOSPITAL_COMMUNITY): Payer: Self-pay | Admitting: Physical Therapy

## 2021-02-23 NOTE — Therapy (Unsigned)
[facility], [place], [address] Phone: [phone]   Fax:  [phone]  Patient Details  Name: Allison Short MRN: [id number] Date of Birth: [birth year] Referring Provider:  No ref. provider found  Encounter Date: 02/23/2021   Pt called re trying to get measured from [facility] [employer] but they are not being very responsive.  She has been trying to get together with them since 11/21 without success.  She finally just ordered some thigh high compression from [employer].  Therapist looked up last measurement and should go down two sizes.  Therapist suggested that pt watches a utube video on how to don compression garments.     Pt wanted to know if she needed to take garments off to pump.  Therapist acknowledged that this was correct.   Rayetta Humphrey, PT CLT [phone]  02/23/2021, 4:32 PM  [facility] [address] [place], [place], [address] Phone: [phone]   Fax:  [phone]

## 2021-03-03 ENCOUNTER — Encounter: Payer: Medicare HMO | Admitting: Adult Health

## 2021-03-11 ENCOUNTER — Encounter: Payer: Medicare HMO | Admitting: Adult Health

## 2021-03-15 ENCOUNTER — Encounter: Payer: Self-pay | Admitting: Adult Health

## 2021-03-15 DIAGNOSIS — H269 Unspecified cataract: Secondary | ICD-10-CM

## 2021-03-23 ENCOUNTER — Encounter: Payer: Medicare HMO | Admitting: Adult Health

## 2021-04-08 ENCOUNTER — Encounter: Payer: Medicare HMO | Admitting: Adult Health

## 2021-04-23 ENCOUNTER — Encounter: Payer: Medicare HMO | Admitting: Adult Health

## 2021-04-30 ENCOUNTER — Encounter: Payer: Medicare HMO | Admitting: Adult Health

## 2021-05-21 ENCOUNTER — Other Ambulatory Visit: Payer: Self-pay | Admitting: Adult Health

## 2021-05-21 ENCOUNTER — Encounter: Payer: Self-pay | Admitting: Adult Health

## 2021-05-21 ENCOUNTER — Telehealth: Payer: Self-pay | Admitting: Adult Health

## 2021-05-21 ENCOUNTER — Encounter: Payer: Medicare HMO | Admitting: Adult Health

## 2021-05-21 DIAGNOSIS — I10 Essential (primary) hypertension: Secondary | ICD-10-CM

## 2021-05-21 MED ORDER — BENAZEPRIL-HYDROCHLOROTHIAZIDE 20-25 MG PO TABS: 1.0000 | ORAL_TABLET | Freq: Every day | ORAL | 0 refills | Status: DC

## 2021-05-21 MED ORDER — METOPROLOL TARTRATE 50 MG PO TABS: 50.0000 mg | ORAL_TABLET | Freq: Two times a day (BID) | ORAL | 0 refills | Status: DC

## 2021-05-21 NOTE — Telephone Encounter (Signed)
Pt call and stated she need a refill on metoprolol tartrate (LOPRESSOR) 50 MG tablet and benazepril-hydrochlorthiazide (LOTENSIN HCT) 20-25 MG tablet sent to  ?[place], [place] Phone:  [phone]  ?Fax:  [phone]  ?  ?Pt waited for her ride and they didn't show up and she reschedule and will need this because she only have 5 days left and want a call when ssent. ?

## 2021-05-21 NOTE — Progress Notes (Deleted)
? ?Subjective:  ? ? Patient ID: Allison Short, female    DOB: [birth year], 71 y.o.   MRN: [id number] ? ?HPI ?Patient presents for yearly preventative medicine examination. She is a pleasant 71 year old female who  has a past medical history of History of blood transfusion, History of urinary tract infection, Hypertension, Non Hodgkin's lymphoma ([facility]) (2008), Obesity, Osteopenia (04/2017), and Pancreatitis. ? ?Hypertension -managed with Lotensin HCT 20-25 mg daily and Lopressor 50 mg BID. She denies dizziness, lightheadedness, blurred vision, or syncopal episodes ?BP Readings from Last 3 Encounters:  ?09/02/20 110/80  ?08/26/20 (!) 145/67  ?06/24/19 136/88  ? ? ? ?All immunizations and health maintenance protocols were reviewed with the patient and needed orders were placed. ? ?Appropriate screening laboratory values were ordered for the patient including screening of hyperlipidemia, renal function and hepatic function. ? ?Medication reconciliation,  past medical history, social history, problem list and allergies were reviewed in detail with the patient ? ?Goals were established with regard to weight loss, exercise, and  diet in compliance with medications ? ?She is overdue for colon cancer screening  ? ?Review of Systems  ?Constitutional: Negative.   ?HENT: Negative.    ?Eyes: Negative.   ?Respiratory: Negative.    ?Cardiovascular: Negative.   ?Gastrointestinal: Negative.   ?Endocrine: Negative.   ?Genitourinary: Negative.   ?Musculoskeletal: Negative.   ?Skin: Negative.   ?Allergic/Immunologic: Negative.   ?Neurological: Negative.   ?Hematological: Negative.   ?Psychiatric/Behavioral: Negative.    ? ?Past Medical History:  ?Diagnosis Date  ? History of blood transfusion   ? 2016  ? History of urinary tract infection   ? Hypertension   ? Non Hodgkin's lymphoma ([facility]) 2008  ? Obesity   ? Osteopenia 04/2017  ? T score -1.4 FRAX 7.5% / 0.8%  ? Pancreatitis   ? ? ?Social History  ? ?Socioeconomic History  ? Marital status:  Divorced  ?  Spouse name: Not on file  ? Number of children: Not on file  ? Years of education: Not on file  ? Highest education level: Not on file  ?Occupational History  ? Not on file  ?Tobacco Use  ? Smoking status: Never  ? Smokeless tobacco: Never  ?Vaping Use  ? Vaping Use: Never used  ?Substance and Sexual Activity  ? Alcohol use: Yes  ?  Alcohol/week: 0.0 standard drinks  ?  Comment: Occas  ? Drug use: No  ? Sexual activity: Not Currently  ?  Comment: 1st intercourse 45 yo-5 partners  ?Other Topics Concern  ? Not on file  ?Social History Narrative  ? Not on file  ? ?Social Determinants of Health  ? ?Financial Resource Strain: Low Risk   ? Difficulty of Paying Living Expenses: Not hard at all  ?Food Insecurity: No Food Insecurity  ? Worried About Charity fundraiser in the Last Year: Never true  ? Ran Out of Food in the Last Year: Never true  ?Transportation Needs: No Transportation Needs  ? Lack of Transportation (Medical): No  ? Lack of Transportation (Non-Medical): No  ?Physical Activity: Inactive  ? Days of Exercise per Week: 0 days  ? Minutes of Exercise per Session: 0 min  ?Stress: No Stress Concern Present  ? Feeling of Stress : Not at all  ?Social Connections: Socially Isolated  ? Frequency of Communication with Friends and Family: Twice a week  ? Frequency of Social Gatherings with Friends and Family: Twice a week  ? Attends Religious Services: Never  ?  Active Member of Clubs or Organizations: No  ? Attends Archivist Meetings: Never  ? Marital Status: Divorced  ?Intimate Partner Violence: Not At Risk  ? Fear of Current or Ex-Partner: No  ? Emotionally Abused: No  ? Physically Abused: No  ? Sexually Abused: No  ? ? ?Past Surgical History:  ?Procedure Laterality Date  ? BIOPSY THYROID    ? BONE MARROW BIOPSY    ? CESAREAN SECTION  10/26/1975  ? CHOLECYSTECTOMY    ? 2006  ? PORTA CATH INSERTION    ? TONSILLECTOMY AND ADENOIDECTOMY    ? 1952  ? TOTAL KNEE ARTHROPLASTY Bilateral   ? Tumor on  scalp    ? ? ?Family History  ?Problem Relation Age of Onset  ? Arthritis Maternal Grandmother   ? Diabetes Son   ? ? ?Allergies  ?Allergen Reactions  ? Ciprofloxin Hcl [Ciprofloxacin] Other (See Comments)  ?  Unstable gait  ? Morphine And Related Anaphylaxis  ?  Tolerates hydrocodone  ? Asa [Aspirin] Hives  ? Codeine Hives  ? Nsaids   ?  Lowers GFR  ? Penicillins Other (See Comments)  ?  Patient does not know  ?  ? ? ?Current Outpatient Medications on File Prior to Visit  ?Medication Sig Dispense Refill  ? acetaminophen (TYLENOL) 500 MG tablet Take 500 mg by mouth every 6 (six) hours as needed for moderate pain.     ? benazepril-hydrochlorthiazide (LOTENSIN HCT) 20-25 MG tablet Take 1 tablet by mouth daily. 90 tablet 0  ? Cholecalciferol (VITAMIN D PO) Take 1,000 Units by mouth daily.     ? metoprolol tartrate (LOPRESSOR) 50 MG tablet Take 1 tablet (50 mg total) by mouth 2 (two) times daily. 180 tablet 0  ? ?No current facility-administered medications on file prior to visit.  ? ? ?There were no vitals taken for this visit. ? ? ?   ?Objective:  ? Physical Exam ?Vitals and nursing note reviewed.  ?Constitutional:   ?   General: She is not in acute distress. ?   Appearance: Normal appearance. She is well-developed. She is not ill-appearing.  ?HENT:  ?   Head: Normocephalic and atraumatic.  ?   Right Ear: Tympanic membrane, ear canal and external ear normal. There is no impacted cerumen.  ?   Left Ear: Tympanic membrane, ear canal and external ear normal. There is no impacted cerumen.  ?   Nose: Nose normal. No congestion or rhinorrhea.  ?   Mouth/Throat:  ?   Mouth: Mucous membranes are moist.  ?   Pharynx: Oropharynx is clear. No oropharyngeal exudate or posterior oropharyngeal erythema.  ?Eyes:  ?   General:     ?   Right eye: No discharge.     ?   Left eye: No discharge.  ?   Extraocular Movements: Extraocular movements intact.  ?   Conjunctiva/sclera: Conjunctivae normal.  ?   Pupils: Pupils are equal, round, and  reactive to light.  ?Neck:  ?   Thyroid: No thyromegaly.  ?   Vascular: No carotid bruit.  ?   Trachea: No tracheal deviation.  ?Cardiovascular:  ?   Rate and Rhythm: Normal rate and regular rhythm.  ?   Pulses: Normal pulses.  ?   Heart sounds: Normal heart sounds. No murmur heard. ?  No friction rub. No gallop.  ?Pulmonary:  ?   Effort: Pulmonary effort is normal. No respiratory distress.  ?   Breath sounds: Normal breath sounds. No  stridor. No wheezing, rhonchi or rales.  ?Chest:  ?   Chest wall: No tenderness.  ?Abdominal:  ?   General: Abdomen is flat. Bowel sounds are normal. There is no distension.  ?   Palpations: Abdomen is soft. There is no mass.  ?   Tenderness: There is no abdominal tenderness. There is no right CVA tenderness, left CVA tenderness, guarding or rebound.  ?   Hernia: No hernia is present.  ?Musculoskeletal:     ?   General: No swelling, tenderness, deformity or signs of injury. Normal range of motion.  ?   Cervical back: Normal range of motion and neck supple.  ?   Right lower leg: No edema.  ?   Left lower leg: No edema.  ?Lymphadenopathy:  ?   Cervical: No cervical adenopathy.  ?Skin: ?   General: Skin is warm and dry.  ?   Coloration: Skin is not jaundiced or pale.  ?   Findings: No bruising, erythema, lesion or rash.  ?Neurological:  ?   General: No focal deficit present.  ?   Mental Status: She is alert and oriented to person, place, and time.  ?   Cranial Nerves: No cranial nerve deficit.  ?   Sensory: No sensory deficit.  ?   Motor: No weakness.  ?   Coordination: Coordination normal.  ?   Gait: Gait normal.  ?   Deep Tendon Reflexes: Reflexes normal.  ?Psychiatric:     ?   Mood and Affect: Mood normal.     ?   Behavior: Behavior normal.     ?   Thought Content: Thought content normal.     ?   Judgment: Judgment normal.  ? ?   ?Assessment & Plan:  ? ? ?

## 2021-05-24 ENCOUNTER — Encounter: Payer: Self-pay | Admitting: Adult Health

## 2021-05-24 NOTE — Telephone Encounter (Signed)
See mychart message pt sent. ?

## 2021-06-11 ENCOUNTER — Encounter: Payer: Medicare HMO | Admitting: Adult Health

## 2021-07-07 ENCOUNTER — Encounter: Payer: Medicare HMO | Admitting: Adult Health

## 2021-07-07 NOTE — Progress Notes (Deleted)
   Subjective:    Patient ID: Allison Short, female    DOB: 19-Feb-1951, 71 y.o.   MRN: [id number]  HPI Patient presents for yearly preventative medicine examination. She is a pleasant 71 year old female who  has a past medical history of History of blood transfusion, History of urinary tract infection, Hypertension, Non Hodgkin's lymphoma ([place] [facility]) (2008), Obesity, Osteopenia (04/2017), and Pancreatitis.  Hypertension -managed with Lotensin HCT 20-25 mg daily and Lopressor 50 mg twice daily.  She denies dizziness, lightheadedness, chest pain, or shortness of breath    All immunizations and health maintenance protocols were reviewed with the patient and needed orders were placed.  Appropriate screening laboratory values were ordered for the patient including screening of hyperlipidemia, renal function and hepatic function. If indicated by BPH, a PSA was ordered.  Medication reconciliation,  past medical history, social history, problem list and allergies were reviewed in detail with the patient  Goals were established with regard to weight loss, exercise, and  diet in compliance with medications  End of life planning was discussed.    Review of Systems     Objective:   Physical Exam        Assessment & Plan:

## 2021-07-29 ENCOUNTER — Encounter: Payer: Medicare HMO | Admitting: Adult Health

## 2021-08-11 ENCOUNTER — Encounter: Payer: Medicare HMO | Admitting: Adult Health

## 2021-08-28 ENCOUNTER — Other Ambulatory Visit: Payer: Self-pay | Admitting: Adult Health

## 2021-08-28 DIAGNOSIS — I10 Essential (primary) hypertension: Secondary | ICD-10-CM

## 2021-09-02 ENCOUNTER — Encounter: Payer: Medicare HMO | Admitting: Adult Health

## 2021-09-07 ENCOUNTER — Telehealth: Payer: Self-pay | Admitting: Adult Health

## 2021-09-07 NOTE — Telephone Encounter (Signed)
Left message for patient to call back and schedule Medicare Annual Wellness Visit (AWV) either virtually or phone   Last AWV ;09/09/20   I left my direct # [phone]

## 2021-09-13 ENCOUNTER — Ambulatory Visit (INDEPENDENT_AMBULATORY_CARE_PROVIDER_SITE_OTHER): Payer: Medicare HMO

## 2021-09-13 DIAGNOSIS — Z0000 Encounter for general adult medical examination without abnormal findings: Secondary | ICD-10-CM

## 2021-09-13 NOTE — Progress Notes (Signed)
Subjective:   Allison Short is a 71 y.o. female who presents for Medicare Annual (Subsequent) preventive examination.  Review of Systems    Virtual Visit via Telephone Note  I connected with  [name] on 09/13/21 at 12:30 PM EDT by telephone and verified that I am speaking with the correct person using two identifiers.  Location: Patient: Home Provider: Office Persons participating in the virtual visit: patient/Nurse Health Advisor   I discussed the limitations, risks, security and privacy concerns of performing an evaluation and management service by telephone and the availability of in person appointments. The patient expressed understanding and agreed to proceed.  Interactive audio and video telecommunications were attempted between this nurse and patient, however failed, due to patient having technical difficulties OR patient did not have access to video capability.  We continued and completed visit with audio only.  Some vital signs may be absent or patient reported.   Criselda Peaches, LPN  Cardiac Risk Factors include: advanced age (>46mn, >>3women);hypertension     Objective:    Today's Vitals   There is no height or weight on file to calculate BMI.     09/13/2021   12:46 PM 11/04/2020    2:00 PM 09/09/2020    1:08 PM 08/20/2020   11:00 PM 08/20/2020    3:48 PM 07/08/2015    8:05 PM  Advanced Directives  Does Patient Have a Medical Advance Directive? No No Yes No No No  Type of AComptrollerLiving will     [place]?   No - copy requested     Would patient like information on creating a medical advance directive? No - Patient declined No - Patient declined  No - Patient declined      Current Medications (verified) Outpatient Encounter Medications as of 09/13/2021  Medication Sig   acetaminophen (TYLENOL) 500 MG tablet Take 500 mg by mouth every 6 (six) hours as needed for moderate pain.     benazepril-hydrochlorthiazide (LOTENSIN HCT) 20-25 MG tablet Take 1 tablet by mouth daily. KEEP APPT FOR REFILLS   Cholecalciferol (VITAMIN D PO) Take 1,000 Units by mouth daily.    metoprolol tartrate (LOPRESSOR) 50 MG tablet Take 1 tablet (50 mg total) by mouth 2 (two) times daily. KEEP APPT FOR REFILLS   No facility-administered encounter medications on file as of 09/13/2021.    Allergies (verified) Ciprofloxin hcl [ciprofloxacin], Morphine and related, Asa [aspirin], Codeine, Nsaids, and Penicillins   History: Past Medical History:  Diagnosis Date   History of blood transfusion    2016   History of urinary tract infection    Hypertension    Non Hodgkin's lymphoma ([place] 2008   Obesity    Osteopenia 04/2017   T score -1.4 FRAX 7.5% / 0.8%   Pancreatitis    Past Surgical History:  Procedure Laterality Date   BIOPSY THYROID     BONE MARROW BIOPSY     CESAREAN SECTION  10/26/1975   CHOLECYSTECTOMY     2006   PORTA CATH INSERTION     TONSILLECTOMY AND ADENOIDECTOMY     1952   TOTAL KNEE ARTHROPLASTY Bilateral    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     Family History  Problem Relation Age of Onset   Arthritis Maternal Grandmother    Diabetes Son    Social History   Socioeconomic History   Marital status: Divorced    Spouse name: Not on file  Number of children: Not on file   Years of education: Not on file   Highest education level: Not on file  Occupational History   Not on file  Tobacco Use   Smoking status: Never   Smokeless tobacco: Never  Vaping Use   Vaping Use: Never used  Substance and Sexual Activity   Alcohol use: Yes    Alcohol/week: 0.0 standard drinks of alcohol    Comment: Occas   Drug use: No   Sexual activity: Not Currently    Comment: 1st intercourse 15 yo-5 partners  Other Topics Concern   Not on file  Social History Narrative   Not on file   Social Determinants of Health   Financial Resource Strain: Low Risk  (09/13/2021)   Overall Financial Resource  Strain (CARDIA)    Difficulty of Paying Living Expenses: Not hard at all  Food Insecurity: No Food Insecurity (09/13/2021)   Hunger Vital Sign    Worried About Running Out of Food in the Last Year: Never true    Ran Out of Food in the Last Year: Never true  Transportation Needs: No Transportation Needs (09/13/2021)   PRAPARE - Hydrologist (Medical): No    Lack of Transportation (Non-Medical): No  Physical Activity: Inactive (09/13/2021)   Exercise Vital Sign    Days of Exercise per Week: 0 days    Minutes of Exercise per Session: 0 min  Stress: No Stress Concern Present (09/13/2021)   [place]    Feeling of Stress : Not at all  Social Connections: Socially Isolated (09/13/2021)   Social Connection and Isolation Panel [NHANES]    Frequency of Communication with Friends and Family: More than three times a week    Frequency of Social Gatherings with Friends and Family: More than three times a week    Attends Religious Services: Never    Marine scientist or Organizations: No    Attends Archivist Meetings: Never    Marital Status: Divorced    Tobacco Counseling Counseling given: Not Answered   Clinical Intake:  Pre-visit preparation completed: NoDiabetic?  No   Activities of Daily Living    09/13/2021   12:42 PM  In your present state of health, do you have any difficulty performing the following activities:  Hearing? 0  Vision? 0  Difficulty concentrating or making decisions? 0  Walking or climbing stairs? 1  Comment Uses a walker  Dressing or bathing? 1  Comment Uses a walker  Doing errands, shopping? 1  Comment Motive transportation serviceand Nutritional therapist and eating ? N  Using the Toilet? N  In the past six months, have you accidently leaked urine? N  Do you have problems with loss of bowel control? N  Managing your Medications? N  Managing  your Finances? N  Housekeeping or managing your Housekeeping? N    Patient Care Team: Dorothyann Peng, NP as PCP - General (Family Medicine) Fontaine, Belinda Block, MD (Inactive) as Consulting Physician (Gynecology)  Indicate any recent Medical Services you may have received from other than [facility] providers in the past year (date may be approximate).     Assessment:   This is a routine wellness examination for [facility].  Hearing/Vision screen Hearing Screening - Comments:: No hearing difficulty Vision Screening - Comments:: Wears glasses.   Dietary issues and exercise activities discussed: Exercise limited by: None identified   Goals Addressed  This Visit's Progress     No current goal (pt-stated)         Depression Screen    09/13/2021   12:39 PM 09/09/2020    1:10 PM 09/09/2020    1:05 PM 07/08/2015   11:59 AM  PHQ 2/9 Scores  PHQ - 2 Score 0 0 0 0    Fall Risk    09/13/2021   12:46 PM 09/09/2020    1:09 PM 07/08/2015   11:59 AM  [place] in the past year? 0 0 No  Number falls in past yr: 0 0   Injury with Fall? 0 0   Risk for fall due to : No Fall Risks Orthopedic patient   Follow up  Falls evaluation completed;Education provided     [place]:  Any stairs in or around the home? No  If so, are there any without handrails? No  Home free of loose throw rugs in walkways, pet beds, electrical cords, etc? Yes  Adequate lighting in your home to reduce risk of falls? Yes   ASSISTIVE DEVICES UTILIZED TO PREVENT FALLS:  Life alert? No  Use of a cane, walker or w/c? Yes  Grab bars in the bathroom? Yes  Shower chair or bench in shower? Yes  Elevated toilet seat or a handicapped toilet? Yes   TIMED UP AND GO:  Was the test performed? No . Audio Visit  Cognitive Function:        09/13/2021   12:47 PM  6CIT Screen  What Year? 0 points  What month? 0 points  What time? 0 points  Count back from 20 0 points   Months in reverse 0 points  Repeat phrase 0 points  Total Score 0 points    Immunizations  There is no immunization history on file for this patient.  TDAP status: Due, Education has been provided regarding the importance of this vaccine. Advised may receive this vaccine at local pharmacy or Health Dept. Aware to provide a copy of the vaccination record if obtained from local pharmacy or Health Dept. Verbalized acceptance and understanding.  Flu Vaccine status: Declined, Education has been provided regarding the importance of this vaccine but patient still declined. Advised may receive this vaccine at local pharmacy or Health Dept. Aware to provide a copy of the vaccination record if obtained from local pharmacy or Health Dept. Verbalized acceptance and understanding.  Pneumococcal vaccine status: Declined,  Education has been provided regarding the importance of this vaccine but patient still declined. Advised may receive this vaccine at local pharmacy or Health Dept. Aware to provide a copy of the vaccination record if obtained from local pharmacy or Health Dept. Verbalized acceptance and understanding.     Qualifies for Shingles Vaccine? Yes   Zostavax completed No   Shingrix Completed?: No.    Education has been provided regarding the importance of this vaccine. Patient has been advised to call insurance company to determine out of pocket expense if they have not yet received this vaccine. Advised may also receive vaccine at local pharmacy or Health Dept. Verbalized acceptance and understanding.  Screening Tests Health Maintenance  Topic Date Due   COVID-19 Vaccine (1) 09/29/2021 (Originally 01/22/1951)   Zoster Vaccines- Shingrix (1 of 2) 12/14/2021 (Originally 07/24/1969)   Pneumonia Vaccine 69+ Years old (1 - PCV) 09/14/2022 (Originally 07/24/1956)   MAMMOGRAM  09/14/2022 (Originally 04/20/2018)   COLONOSCOPY (Pts 45-63yr Insurance coverage will need to be confirmed)  09/14/2022  (Originally 03/25/2019)   TETANUS/TDAP  09/14/2022 (Originally 02/22/2020)   Hepatitis C Screening  09/14/2022 (Originally 07/24/1968)   INFLUENZA VACCINE  09/21/2021   DEXA SCAN  Completed   HPV VACCINES  Aged Out    Health Maintenance  There are no preventive care reminders to display for this patient.   Colorectal cancer screening: Referral to GI placed Patient deferred. Pt aware the office will call re: appt.  Mammogram status: Ordered Patient deferred. Pt provided with contact info and advised to call to schedule appt.   Bone Density status: Completed 04/20/17. Results reflect: Bone density results: OSTEOPOROSIS. Repeat every   years.  Lung Cancer Screening: (Low Dose CT Chest recommended if Age 23-80 years, 30 pack-year currently smoking OR have quit w/in 15years.) does not qualify.     Additional Screening:  Hepatitis C Screening: does qualify; Completed Patient  deferred Vision Screening: Recommended annual ophthalmology exams for early detection of glaucoma and other disorders of the eye. Is the patient up to date with their annual eye exam?  Yes  Who is the provider or what is the name of the office in which the patient attends annual eye exams? Patient deferred If pt is not established with a provider, would they like to be referred to a provider to establish care? No .   Dental Screening: Recommended annual dental exams for proper oral hygiene  Community Resource Referral / Chronic Care Management:  CRR required this visit?  No   CCM required this visit?  No      Plan:     I have personally reviewed and noted the following in the patient's chart:   Medical and social history Use of alcohol, tobacco or illicit drugs  Current medications and supplements including opioid prescriptions.  Functional ability and status Nutritional status Physical activity Advanced directives List of other physicians Hospitalizations, surgeries, and ER visits in previous 12  months Vitals Screenings to include cognitive, depression, and falls Referrals and appointments  In addition, I have reviewed and discussed with patient certain preventive protocols, quality metrics, and best practice recommendations. A written personalized care plan for preventive services as well as general preventive health recommendations were provided to patient.     Criselda Peaches, LPN   [id number]   Nurse Notes: None

## 2021-09-13 NOTE — Patient Instructions (Addendum)
Allison Short , Thank you for taking time to come for your Medicare Wellness Visit. I appreciate your ongoing commitment to your health goals. Please review the following plan we discussed and let me know if I can assist you in the future.   These are the goals we discussed:  Goals       Maintain Mobility and Function      Evidence-based guidance:  Emphasize the importance of physical activity and aerobic exercise as included in treatment plan; assess barriers to adherence; consider patient's abilities and preferences.  Encourage gradual increase in activity or exercise instead of stopping if pain occurs.  Reinforce individual therapy exercise prescription, such as strengthening, stabilization and stretching programs.  Promote optimal body mechanics to stabilize the spine with lifting and functional activity.  Encourage activity and mobility modifications to facilitate optimal function, such as using a log roll for bed mobility or dressing from a seated position.  Reinforce individual adaptive equipment recommendations to limit excessive spinal movements, such as a Systems analyst.  Assess adequacy of sleep; encourage use of sleep hygiene techniques, such as bedtime routine; use of white noise; dark, cool bedroom; avoiding daytime naps, heavy meals or exercise before bedtime.  Promote positions and modification to optimize sleep and sexual activity; consider pillows or positioning devices to assist in maintaining neutral spine.  Explore options for applying ergonomic principles at work and home, such as frequent position changes, using ergonomically designed equipment and working at optimal height.  Promote modifications to increase comfort with driving such as lumbar support, optimizing seat and steering wheel position, using cruise control and taking frequent rest stops to stretch and walk.   Notes:       No current goal (pt-stated)        This is a list of the screening recommended for  you and due dates:  Health Maintenance  Topic Date Due   COVID-19 Vaccine (1) 09/29/2021*   Zoster (Shingles) Vaccine (1 of 2) 12/14/2021*   Pneumonia Vaccine (1 - PCV) 09/14/2022*   Mammogram  09/14/2022*   Colon Cancer Screening  09/14/2022*   Tetanus Vaccine  09/14/2022*   Hepatitis C Screening: USPSTF Recommendation to screen - Ages 18-79 yo.  09/14/2022*   Flu Shot  09/21/2021   DEXA scan (bone density measurement)  Completed   HPV Vaccine  Aged Out  *Topic was postponed. The date shown is not the original due date.    Advanced directives: No  Conditions/risks identified: None  Next appointment: Follow up in one year for your annual wellness visit     Preventive Care 65 Years and Older, Female Preventive care refers to lifestyle choices and visits with your health care provider that can promote health and wellness. What does preventive care include? A yearly physical exam. This is also called an annual well check. Dental exams once or twice a year. Routine eye exams. Ask your health care provider how often you should have your eyes checked. Personal lifestyle choices, including: Daily care of your teeth and gums. Regular physical activity. Eating a healthy diet. Avoiding tobacco and drug use. Limiting alcohol use. Practicing safe sex. Taking low-dose aspirin every day. Taking vitamin and mineral supplements as recommended by your health care provider. What happens during an annual well check? The services and screenings done by your health care provider during your annual well check will depend on your age, overall health, lifestyle risk factors, and family history of disease. Counseling  Your health care provider may ask  you questions about your: Alcohol use. Tobacco use. Drug use. Emotional well-being. Home and relationship well-being. Sexual activity. Eating habits. History of falls. Memory and ability to understand (cognition). Work and work  Statistician. Reproductive health. Screening  You may have the following tests or measurements: Height, weight, and BMI. Blood pressure. Lipid and cholesterol levels. These may be checked every 5 years, or more frequently if you are over 66 years old. Skin check. Lung cancer screening. You may have this screening every year starting at age 68 if you have a 30-pack-year history of smoking and currently smoke or have quit within the past 15 years. Fecal occult blood test (FOBT) of the stool. You may have this test every year starting at age 40. Flexible sigmoidoscopy or colonoscopy. You may have a sigmoidoscopy every 5 years or a colonoscopy every 10 years starting at age 34. Hepatitis C blood test. Hepatitis B blood test. Sexually transmitted disease (STD) testing. Diabetes screening. This is done by checking your blood sugar (glucose) after you have not eaten for a while (fasting). You may have this done every 1-3 years. Bone density scan. This is done to screen for osteoporosis. You may have this done starting at age 57. Mammogram. This may be done every 1-2 years. Talk to your health care provider about how often you should have regular mammograms. Talk with your health care provider about your test results, treatment options, and if necessary, the need for more tests. Vaccines  Your health care provider may recommend certain vaccines, such as: Influenza vaccine. This is recommended every year. Tetanus, diphtheria, and acellular pertussis (Tdap, Td) vaccine. You may need a Td booster every 10 years. Zoster vaccine. You may need this after age 54. Pneumococcal 13-valent conjugate (PCV13) vaccine. One dose is recommended after age 28. Pneumococcal polysaccharide (PPSV23) vaccine. One dose is recommended after age 56. Talk to your health care provider about which screenings and vaccines you need and how often you need them. This information is not intended to replace advice given to you by  your health care provider. Make sure you discuss any questions you have with your health care provider. Document Released: 03/06/2015 Document Revised: 10/28/2015 Document Reviewed: 12/09/2014 Elsevier Interactive Patient Education  2017 [place] Prevention in the Home Falls can cause injuries. They can happen to people of all ages. There are many things you can do to make your home safe and to help prevent falls. What can I do on the outside of my home? Regularly fix the edges of walkways and driveways and fix any cracks. Remove anything that might make you trip as you walk through a door, such as a raised step or threshold. Trim any bushes or trees on the path to your home. Use bright outdoor lighting. Clear any walking paths of anything that might make someone trip, such as rocks or tools. Regularly check to see if handrails are loose or broken. Make sure that both sides of any steps have handrails. Any raised decks and porches should have guardrails on the edges. Have any leaves, snow, or ice cleared regularly. Use sand or salt on walking paths during winter. Clean up any spills in your garage right away. This includes oil or grease spills. What can I do in the bathroom? Use night lights. Install grab bars by the toilet and in the tub and shower. Do not use towel bars as grab bars. Use non-skid mats or decals in the tub or shower. If you need to  sit down in the shower, use a plastic, non-slip stool. Keep the floor dry. Clean up any water that spills on the floor as soon as it happens. Remove soap buildup in the tub or shower regularly. Attach bath mats securely with double-sided non-slip rug tape. Do not have throw rugs and other things on the floor that can make you trip. What can I do in the bedroom? Use night lights. Make sure that you have a light by your bed that is easy to reach. Do not use any sheets or blankets that are too big for your bed. They should not hang  down onto the floor. Have a firm chair that has side arms. You can use this for support while you get dressed. Do not have throw rugs and other things on the floor that can make you trip. What can I do in the kitchen? Clean up any spills right away. Avoid walking on wet floors. Keep items that you use a lot in easy-to-reach places. If you need to reach something above you, use a strong step stool that has a grab bar. Keep electrical cords out of the way. Do not use floor polish or wax that makes floors slippery. If you must use wax, use non-skid floor wax. Do not have throw rugs and other things on the floor that can make you trip. What can I do with my stairs? Do not leave any items on the stairs. Make sure that there are handrails on both sides of the stairs and use them. Fix handrails that are broken or loose. Make sure that handrails are as long as the stairways. Check any carpeting to make sure that it is firmly attached to the stairs. Fix any carpet that is loose or worn. Avoid having throw rugs at the top or bottom of the stairs. If you do have throw rugs, attach them to the floor with carpet tape. Make sure that you have a light switch at the top of the stairs and the bottom of the stairs. If you do not have them, ask someone to add them for you. What else can I do to help prevent falls? Wear shoes that: Do not have high heels. Have rubber bottoms. Are comfortable and fit you well. Are closed at the toe. Do not wear sandals. If you use a stepladder: Make sure that it is fully opened. Do not climb a closed stepladder. Make sure that both sides of the stepladder are locked into place. Ask someone to hold it for you, if possible. Clearly mark and make sure that you can see: Any grab bars or handrails. First and last steps. Where the edge of each step is. Use tools that help you move around (mobility aids) if they are needed. These  include: Canes. Walkers. Scooters. Crutches. Turn on the lights when you go into a dark area. Replace any light bulbs as soon as they burn out. Set up your furniture so you have a clear path. Avoid moving your furniture around. If any of your floors are uneven, fix them. If there are any pets around you, be aware of where they are. Review your medicines with your doctor. Some medicines can make you feel dizzy. This can increase your chance of falling. Ask your doctor what other things that you can do to help prevent falls. This information is not intended to replace advice given to you by your health care provider. Make sure you discuss any questions you have with your health care  provider. Document Released: 12/04/2008 Document Revised: 07/16/2015 Document Reviewed: 03/14/2014 Elsevier Interactive Patient Education  2017 [employer].

## 2021-09-15 ENCOUNTER — Encounter: Payer: Medicare HMO | Admitting: Adult Health

## 2021-09-28 ENCOUNTER — Encounter: Payer: Self-pay | Admitting: Adult Health

## 2021-10-07 ENCOUNTER — Encounter: Payer: Medicare HMO | Admitting: Adult Health

## 2022-02-03 ENCOUNTER — Encounter: Payer: Self-pay | Admitting: Adult Health

## 2022-02-03 DIAGNOSIS — I10 Essential (primary) hypertension: Secondary | ICD-10-CM

## 2022-02-04 ENCOUNTER — Other Ambulatory Visit: Payer: Self-pay | Admitting: Adult Health

## 2022-02-04 DIAGNOSIS — I10 Essential (primary) hypertension: Secondary | ICD-10-CM

## 2022-02-04 MED ORDER — METOPROLOL TARTRATE 50 MG PO TABS: 50.0000 mg | ORAL_TABLET | Freq: Two times a day (BID) | ORAL | 0 refills | Status: DC

## 2022-02-04 MED ORDER — BENAZEPRIL-HYDROCHLOROTHIAZIDE 20-25 MG PO TABS: 1.0000 | ORAL_TABLET | Freq: Every day | ORAL | 0 refills | Status: DC

## 2022-04-08 ENCOUNTER — Inpatient Hospital Stay (HOSPITAL_COMMUNITY)
Admission: EM | Admit: 2022-04-08 | Discharge: 2022-04-29 | DRG: 602 | Disposition: A | Discharge status: Skilled Nursing Facility | Payer: Medicare HMO | Attending: Family Medicine | Admitting: Family Medicine

## 2022-04-08 ENCOUNTER — Other Ambulatory Visit: Payer: Self-pay

## 2022-04-08 ENCOUNTER — Encounter (HOSPITAL_COMMUNITY): Payer: Self-pay | Admitting: Emergency Medicine

## 2022-04-08 ENCOUNTER — Emergency Department (HOSPITAL_BASED_OUTPATIENT_CLINIC_OR_DEPARTMENT_OTHER): Admit: 2022-04-08 | Discharge: 2022-04-08 | Disposition: A | Discharge status: Home / Self Care | Payer: Medicare HMO

## 2022-04-08 ENCOUNTER — Telehealth: Payer: Medicare HMO | Admitting: Family Medicine

## 2022-04-08 DIAGNOSIS — E873 Alkalosis: Secondary | ICD-10-CM | POA: Diagnosis present

## 2022-04-08 DIAGNOSIS — M6281 Muscle weakness (generalized): Secondary | ICD-10-CM | POA: Diagnosis not present

## 2022-04-08 DIAGNOSIS — I10 Essential (primary) hypertension: Secondary | ICD-10-CM | POA: Diagnosis not present

## 2022-04-08 DIAGNOSIS — R531 Weakness: Secondary | ICD-10-CM | POA: Diagnosis not present

## 2022-04-08 DIAGNOSIS — J9611 Chronic respiratory failure with hypoxia: Secondary | ICD-10-CM | POA: Diagnosis not present

## 2022-04-08 DIAGNOSIS — F40240 Claustrophobia: Secondary | ICD-10-CM | POA: Diagnosis present

## 2022-04-08 DIAGNOSIS — Z880 Allergy status to penicillin: Secondary | ICD-10-CM

## 2022-04-08 DIAGNOSIS — F05 Delirium due to known physiological condition: Secondary | ICD-10-CM | POA: Insufficient documentation

## 2022-04-08 DIAGNOSIS — J9622 Acute and chronic respiratory failure with hypercapnia: Secondary | ICD-10-CM | POA: Diagnosis not present

## 2022-04-08 DIAGNOSIS — Z923 Personal history of irradiation: Secondary | ICD-10-CM

## 2022-04-08 DIAGNOSIS — J189 Pneumonia, unspecified organism: Secondary | ICD-10-CM | POA: Diagnosis present

## 2022-04-08 DIAGNOSIS — R2689 Other abnormalities of gait and mobility: Secondary | ICD-10-CM | POA: Diagnosis not present

## 2022-04-08 DIAGNOSIS — E662 Morbid (severe) obesity with alveolar hypoventilation: Secondary | ICD-10-CM | POA: Diagnosis not present

## 2022-04-08 DIAGNOSIS — R918 Other nonspecific abnormal finding of lung field: Secondary | ICD-10-CM | POA: Diagnosis not present

## 2022-04-08 DIAGNOSIS — F410 Panic disorder [episodic paroxysmal anxiety] without agoraphobia: Secondary | ICD-10-CM | POA: Diagnosis present

## 2022-04-08 DIAGNOSIS — J440 Chronic obstructive pulmonary disease with acute lower respiratory infection: Secondary | ICD-10-CM | POA: Diagnosis present

## 2022-04-08 DIAGNOSIS — I110 Hypertensive heart disease with heart failure: Secondary | ICD-10-CM | POA: Diagnosis present

## 2022-04-08 DIAGNOSIS — R609 Edema, unspecified: Secondary | ICD-10-CM | POA: Diagnosis not present

## 2022-04-08 DIAGNOSIS — I5A Non-ischemic myocardial injury (non-traumatic): Secondary | ICD-10-CM | POA: Diagnosis present

## 2022-04-08 DIAGNOSIS — I890 Lymphedema, not elsewhere classified: Secondary | ICD-10-CM | POA: Diagnosis present

## 2022-04-08 DIAGNOSIS — D649 Anemia, unspecified: Secondary | ICD-10-CM | POA: Diagnosis not present

## 2022-04-08 DIAGNOSIS — Z8572 Personal history of non-Hodgkin lymphomas: Secondary | ICD-10-CM

## 2022-04-08 DIAGNOSIS — L03115 Cellulitis of right lower limb: Secondary | ICD-10-CM | POA: Diagnosis present

## 2022-04-08 DIAGNOSIS — E876 Hypokalemia: Secondary | ICD-10-CM | POA: Diagnosis present

## 2022-04-08 DIAGNOSIS — Z87440 Personal history of urinary (tract) infections: Secondary | ICD-10-CM

## 2022-04-08 DIAGNOSIS — D519 Vitamin B12 deficiency anemia, unspecified: Secondary | ICD-10-CM | POA: Diagnosis present

## 2022-04-08 DIAGNOSIS — N179 Acute kidney failure, unspecified: Secondary | ICD-10-CM | POA: Diagnosis present

## 2022-04-08 DIAGNOSIS — Z9049 Acquired absence of other specified parts of digestive tract: Secondary | ICD-10-CM

## 2022-04-08 DIAGNOSIS — I5033 Acute on chronic diastolic (congestive) heart failure: Secondary | ICD-10-CM | POA: Diagnosis present

## 2022-04-08 DIAGNOSIS — Y95 Nosocomial condition: Secondary | ICD-10-CM | POA: Diagnosis present

## 2022-04-08 DIAGNOSIS — L0390 Cellulitis, unspecified: Secondary | ICD-10-CM | POA: Diagnosis not present

## 2022-04-08 DIAGNOSIS — Z6841 Body Mass Index (BMI) 40.0 and over, adult: Secondary | ICD-10-CM

## 2022-04-08 DIAGNOSIS — Z889 Allergy status to unspecified drugs, medicaments and biological substances status: Secondary | ICD-10-CM | POA: Diagnosis not present

## 2022-04-08 DIAGNOSIS — Z833 Family history of diabetes mellitus: Secondary | ICD-10-CM | POA: Diagnosis not present

## 2022-04-08 DIAGNOSIS — G4730 Sleep apnea, unspecified: Secondary | ICD-10-CM | POA: Diagnosis not present

## 2022-04-08 DIAGNOSIS — L03119 Cellulitis of unspecified part of limb: Secondary | ICD-10-CM | POA: Diagnosis present

## 2022-04-08 DIAGNOSIS — Z881 Allergy status to other antibiotic agents status: Secondary | ICD-10-CM

## 2022-04-08 DIAGNOSIS — C8590 Non-Hodgkin lymphoma, unspecified, unspecified site: Secondary | ICD-10-CM | POA: Diagnosis not present

## 2022-04-08 DIAGNOSIS — Z8261 Family history of arthritis: Secondary | ICD-10-CM

## 2022-04-08 DIAGNOSIS — Z7401 Bed confinement status: Secondary | ICD-10-CM | POA: Diagnosis not present

## 2022-04-08 DIAGNOSIS — J9612 Chronic respiratory failure with hypercapnia: Secondary | ICD-10-CM | POA: Diagnosis present

## 2022-04-08 DIAGNOSIS — Z885 Allergy status to narcotic agent status: Secondary | ICD-10-CM

## 2022-04-08 DIAGNOSIS — C8591 Non-Hodgkin lymphoma, unspecified, lymph nodes of head, face, and neck: Secondary | ICD-10-CM | POA: Diagnosis not present

## 2022-04-08 DIAGNOSIS — Z9221 Personal history of antineoplastic chemotherapy: Secondary | ICD-10-CM

## 2022-04-08 DIAGNOSIS — Z96653 Presence of artificial knee joint, bilateral: Secondary | ICD-10-CM | POA: Diagnosis present

## 2022-04-08 DIAGNOSIS — Z79899 Other long term (current) drug therapy: Secondary | ICD-10-CM

## 2022-04-08 DIAGNOSIS — J9621 Acute and chronic respiratory failure with hypoxia: Secondary | ICD-10-CM | POA: Diagnosis not present

## 2022-04-08 DIAGNOSIS — Z751 Person awaiting admission to adequate facility elsewhere: Secondary | ICD-10-CM

## 2022-04-08 DIAGNOSIS — J811 Chronic pulmonary edema: Secondary | ICD-10-CM | POA: Diagnosis not present

## 2022-04-08 DIAGNOSIS — R5381 Other malaise: Secondary | ICD-10-CM | POA: Diagnosis present

## 2022-04-08 DIAGNOSIS — Z888 Allergy status to other drugs, medicaments and biological substances status: Secondary | ICD-10-CM

## 2022-04-08 DIAGNOSIS — R41841 Cognitive communication deficit: Secondary | ICD-10-CM | POA: Diagnosis not present

## 2022-04-08 DIAGNOSIS — R0602 Shortness of breath: Secondary | ICD-10-CM | POA: Diagnosis not present

## 2022-04-08 DIAGNOSIS — D72829 Elevated white blood cell count, unspecified: Secondary | ICD-10-CM | POA: Diagnosis present

## 2022-04-08 DIAGNOSIS — R4182 Altered mental status, unspecified: Secondary | ICD-10-CM | POA: Diagnosis not present

## 2022-04-08 HISTORY — DX: Claustrophobia: F40.240

## 2022-04-08 LAB — BASIC METABOLIC PANEL
Anion gap: 12 (ref 5–15)
BUN: 23 mg/dL (ref 8–23)
CO2: 30 mmol/L (ref 22–32)
Calcium: 9 mg/dL (ref 8.9–10.3)
Chloride: 96 mmol/L — ABNORMAL LOW (ref 98–111)
Creatinine, Ser: 0.99 mg/dL (ref 0.44–1.00)
GFR, Estimated: >60 mL/min (ref >60)
Glucose, Bld: 144 mg/dL — ABNORMAL HIGH (ref 70–99)
Potassium: 3.5 mmol/L (ref 3.5–5.1)
Sodium: 138 mmol/L (ref 135–145)

## 2022-04-08 LAB — HEPATIC FUNCTION PANEL
ALT: 17 U/L (ref 0–44)
AST: 23 U/L (ref 15–41)
Albumin: 3.9 g/dL (ref 3.5–5.0)
Alkaline Phosphatase: 81 U/L (ref 38–126)
Bilirubin, Direct: 0.3 mg/dL — ABNORMAL HIGH (ref 0.0–0.2)
Indirect Bilirubin: 0.9 mg/dL (ref 0.3–0.9)
Total Bilirubin: 1.2 mg/dL (ref 0.3–1.2)
Total Protein: 7.1 g/dL (ref 6.5–8.1)

## 2022-04-08 LAB — CBC
HCT: 40.6 % (ref 36.0–46.0)
Hemoglobin: 12.7 g/dL (ref 12.0–15.0)
MCH: 32.7 pg (ref 26.0–34.0)
MCHC: 31.3 g/dL (ref 30.0–36.0)
MCV: 104.6 fL — ABNORMAL HIGH (ref 80.0–100.0)
Platelets: 253 10*3/uL (ref 150–400)
RBC: 3.88 MIL/uL (ref 3.87–5.11)
RDW: 15.4 % (ref 11.5–15.5)
WBC: 10.8 10*3/uL — ABNORMAL HIGH (ref 4.0–10.5)
nRBC: 0 % (ref 0.0–0.2)

## 2022-04-08 LAB — TSH: TSH: 1.196 u[IU]/mL (ref 0.350–4.500)

## 2022-04-08 LAB — I-STAT CHEM 8, ED
BUN: 28 mg/dL — ABNORMAL HIGH (ref 8–23)
Calcium, Ion: 1.13 mmol/L — ABNORMAL LOW (ref 1.15–1.40)
Chloride: 96 mmol/L — ABNORMAL LOW (ref 98–111)
Creatinine, Ser: 0.9 mg/dL (ref 0.44–1.00)
Glucose, Bld: 147 mg/dL — ABNORMAL HIGH (ref 70–99)
HCT: 40 % (ref 36.0–46.0)
Hemoglobin: 13.6 g/dL (ref 12.0–15.0)
Potassium: 3.4 mmol/L — ABNORMAL LOW (ref 3.5–5.1)
Sodium: 141 mmol/L (ref 135–145)
TCO2: 34 mmol/L — ABNORMAL HIGH (ref 22–32)

## 2022-04-08 LAB — MAGNESIUM: Magnesium: 1.6 mg/dL — ABNORMAL LOW (ref 1.7–2.4)

## 2022-04-08 LAB — FOLATE: Folate: 12.4 ng/mL (ref >5.9)

## 2022-04-08 LAB — VITAMIN B12: Vitamin B-12: 312 pg/mL (ref 180–914)

## 2022-04-08 MED ORDER — HYDROCODONE-ACETAMINOPHEN 5-325 MG PO TABS
1.0000 | ORAL_TABLET | ORAL | Status: DC | PRN
  Administered 2022-04-08 – 2022-04-15 (×10): 2 via ORAL
  Filled 2022-04-08 (×10): qty 2

## 2022-04-08 MED ORDER — ONDANSETRON HCL 4 MG/2ML IJ SOLN: 4.0000 mg | Freq: Four times a day (QID) | INTRAMUSCULAR | Status: DC | PRN

## 2022-04-08 MED ORDER — ACETAMINOPHEN 325 MG PO TABS
650.0000 mg | ORAL_TABLET | Freq: Four times a day (QID) | ORAL | Status: DC | PRN
  Administered 2022-04-11 – 2022-04-27 (×6): 650 mg via ORAL
  Filled 2022-04-08 (×6): qty 2

## 2022-04-08 MED ORDER — DIPHENHYDRAMINE HCL 25 MG PO CAPS
25.0000 mg | ORAL_CAPSULE | Freq: Four times a day (QID) | ORAL | Status: DC | PRN
  Administered 2022-04-09 – 2022-04-12 (×7): 25 mg via ORAL
  Filled 2022-04-08 (×8): qty 1

## 2022-04-08 MED ORDER — BENAZEPRIL HCL 20 MG PO TABS
20.0000 mg | ORAL_TABLET | Freq: Every day | ORAL | Status: DC
  Administered 2022-04-09 – 2022-04-13 (×5): 20 mg via ORAL
  Filled 2022-04-08 (×5): qty 1

## 2022-04-08 MED ORDER — VANCOMYCIN HCL 1500 MG/300ML IV SOLN
1500.0000 mg | Freq: Once | INTRAVENOUS | Status: AC
  Administered 2022-04-09: 1500 mg via INTRAVENOUS
  Filled 2022-04-08: qty 300

## 2022-04-08 MED ORDER — POTASSIUM CHLORIDE CRYS ER 20 MEQ PO TBCR
20.0000 meq | EXTENDED_RELEASE_TABLET | Freq: Once | ORAL | Status: AC
  Administered 2022-04-08: 20 meq via ORAL
  Filled 2022-04-08: qty 1

## 2022-04-08 MED ORDER — ENOXAPARIN SODIUM 80 MG/0.8ML IJ SOSY
80.0000 mg | PREFILLED_SYRINGE | INTRAMUSCULAR | Status: DC
  Administered 2022-04-08 – 2022-04-28 (×21): 80 mg via SUBCUTANEOUS
  Filled 2022-04-08 (×21): qty 0.8

## 2022-04-08 MED ORDER — HYDROCHLOROTHIAZIDE 25 MG PO TABS
25.0000 mg | ORAL_TABLET | Freq: Every day | ORAL | Status: DC
  Administered 2022-04-09 – 2022-04-13 (×5): 25 mg via ORAL
  Filled 2022-04-08 (×5): qty 1

## 2022-04-08 MED ORDER — MAGNESIUM SULFATE 2 GM/50ML IV SOLN
2.0000 g | Freq: Once | INTRAVENOUS | Status: AC
  Administered 2022-04-08: 2 g via INTRAVENOUS
  Filled 2022-04-08: qty 50

## 2022-04-08 MED ORDER — ONDANSETRON HCL 4 MG PO TABS
4.0000 mg | ORAL_TABLET | Freq: Four times a day (QID) | ORAL | Status: DC | PRN
  Administered 2022-04-12: 4 mg via ORAL
  Filled 2022-04-08: qty 1

## 2022-04-08 MED ORDER — FUROSEMIDE 10 MG/ML IJ SOLN
40.0000 mg | Freq: Once | INTRAMUSCULAR | Status: AC
  Administered 2022-04-08: 40 mg via INTRAVENOUS
  Filled 2022-04-08: qty 4

## 2022-04-08 MED ORDER — VANCOMYCIN HCL IN DEXTROSE 1-5 GM/200ML-% IV SOLN
1000.0000 mg | Freq: Once | INTRAVENOUS | Status: AC
  Administered 2022-04-08: 1000 mg via INTRAVENOUS
  Filled 2022-04-08: qty 200

## 2022-04-08 MED ORDER — ACETAMINOPHEN 650 MG RE SUPP: 650.0000 mg | Freq: Four times a day (QID) | RECTAL | Status: DC | PRN

## 2022-04-08 MED ORDER — CYANOCOBALAMIN 500 MCG PO TABS
500.0000 ug | ORAL_TABLET | Freq: Every day | ORAL | Status: DC
  Administered 2022-04-09 – 2022-04-29 (×21): 500 ug via ORAL
  Filled 2022-04-08 (×21): qty 1

## 2022-04-08 MED ORDER — METOPROLOL TARTRATE 50 MG PO TABS
50.0000 mg | ORAL_TABLET | Freq: Two times a day (BID) | ORAL | Status: DC
  Administered 2022-04-08 – 2022-04-13 (×10): 50 mg via ORAL
  Filled 2022-04-08 (×10): qty 1

## 2022-04-08 MED ORDER — VANCOMYCIN HCL IN DEXTROSE 1-5 GM/200ML-% IV SOLN
1000.0000 mg | Freq: Two times a day (BID) | INTRAVENOUS | Status: DC
  Administered 2022-04-09 – 2022-04-11 (×4): 1000 mg via INTRAVENOUS
  Filled 2022-04-08 (×5): qty 200

## 2022-04-08 MED ORDER — BENAZEPRIL-HYDROCHLOROTHIAZIDE 20-25 MG PO TABS: 1.0000 | ORAL_TABLET | Freq: Every day | ORAL | Status: DC

## 2022-04-08 NOTE — ED Provider Triage Note (Signed)
Emergency Medicine Provider Triage Evaluation Note  Allison Short , a 72 y.o. female  was evaluated in triage.  Pt complains of bilateral leg swelling that got worse in the last 3 weeks.  Patient was diagnosed with lymphedema of the leg since 2022 and she has been using lymphedema pump since.  She reports in the last 3 weeks the swelling has gotten worse with weeping fluid from the bottom of her feet.  Denies fever.  Patient was able to ambulate at home with a walker however  with increased difficulty.  Review of Systems  Positive: As above Negative: As above  Physical Exam  BP (!) 184/94   Pulse 84   Temp 97.6 F (36.4 C)   Resp 20   SpO2 98%  Gen:   Awake, no distress   Resp:  Normal effort  MSK:   Moves extremities without difficulty  Other:  Significant bilateral leg edema with erythema of the feet.  Medical Decision Making  Medically screening exam initiated at 4:45 PM.  Appropriate orders placed.  Allison Short was informed that the remainder of the evaluation will be completed by another provider, this initial triage assessment does not replace that evaluation, and the importance of remaining in the ED until their evaluation is complete.     Rex Kras, [place] 04/09/22 [phone]

## 2022-04-08 NOTE — Progress Notes (Signed)
Bilateral lower extremity venous duplex has been completed. Preliminary results can be found in CV Proc through chart review.  Results were given to Rex Kras PA.  04/08/22 5:25 PM Carlos Levering RVT

## 2022-04-08 NOTE — ED Triage Notes (Addendum)
Patient BIB EMS from home. Per report pt BLE lymphedema progressively getting worse x 3 weeks. Pt report she was using lymphedema pump and leg compression but not helping at this time. Pt denies N/V.   BP 132/ 80 HR 84 RR 20 O2sat 98% on RA

## 2022-04-08 NOTE — Assessment & Plan Note (Addendum)
Completed antibiotics - Return to lymphedema clinic after discharge - Obtain NIV - Low sodium diet

## 2022-04-08 NOTE — Progress Notes (Signed)
Because Ms. Allison Short, I feel your condition warrants further evaluation and I recommend that you be seen in a face to face visit.   NOTE: There will be NO CHARGE for this eVisit   If you are having a true medical emergency please call 911.      For an urgent face to face visit, [facility] has eight urgent care centers for your convenience:   NEW!! [facility] [place] [facility] [place] Get Driving Directions [id number] [address] [place], [place] [facility] [place] [facility] [place] Get Driving Directions [id number] [place], [place] [address]   [facility] [place] [facility]) Get Driving Directions [id number] [address], [place] [address]  [place] [facility] [place] ([place]) Get Driving Directions [id number] [address] [place],  [place]  [address]  [place] [facility] [place] [facility] [address] Get Driving Directions  [phone] [address] [place],  [place] [address]   [facility] Get Driving Directions [id number] [address] [place], [place], [place] [address]   [facility] Get Driving Directions  [id number] [address] [place],  [address]   [facility] [place] Get Driving Directions [id number] [address] [place],  [address]  Your MyChart E-visit questionnaire answers were reviewed by a board certified advanced clinical practitioner to complete your personal care plan based on your specific symptoms.  Thank you for using e-Visits.     have provided 5 minutes of non face to face time during this encounter for chart review and documentation.

## 2022-04-08 NOTE — Assessment & Plan Note (Signed)
S/p chemo/radiation over 10 years ago.

## 2022-04-08 NOTE — Progress Notes (Signed)
Pharmacy Antibiotic Note  Allison Short is a 72 y.o. female admitted on 04/08/2022 with cellulitis of lower extremity.  Patient received Vancomycin 1gm IV x 1 in the ED.  Pharmacy has been consulted for Vancomycin dosing.  Plan: Give additional Vancomycin 1554m IV x 1 dose now (for total loading dose of 25045mtoday) Vancomycin 1000 mg IV Q 12 hrs. Goal AUC 400-550.  Expected AUC: 508.5  SCr used: 0.99 Follow renal function Monitor vancomycin levels as needed  Height: 5' 6"$  (167.6 cm) Weight: (!) 179.9 kg (396 lb 9.7 oz) IBW/kg (Calculated) : 59.3  Temp (24hrs), Avg:97.9 F (36.6 C), Min:97.6 F (36.4 C), Max:98.4 F (36.9 C)  Recent Labs  Lab 04/08/22 1852 04/08/22 1918  WBC  --  10.8*  CREATININE 0.90 0.99    Estimated Creatinine Clearance: 85.9 mL/min (by C-G formula based on SCr of 0.99 mg/dL).    Allergies  Allergen Reactions   Ciprofloxin Hcl [Ciprofloxacin] Other (See Comments)    Unstable gait   Morphine And Related Anaphylaxis and Other (See Comments)    Tolerates hydrocodone   Asa [Aspirin] Hives and Other (See Comments)    Hives can become SEVERE, requiring intervention   Codeine Hives   Chia Oil Swelling and Other (See Comments)    Possibly "chia tea" = Face and lips became swollen   Erythromycin Other (See Comments)    Reaction not recalled   Nsaids Other (See Comments)    Lowers GFR   Penicillins Other (See Comments)    Reaction not recalled- was told to "never take this again" by family    Antimicrobials this admission: 2/16 Vancomycin >>      Dose adjustments this admission:    Microbiology results:    Thank you for allowing pharmacy to be a part of this patient's care.  PoEverette RankPharmD 04/08/2022 11:04 PM

## 2022-04-08 NOTE — ED Provider Notes (Signed)
[place] EMERGENCY DEPARTMENT AT [facility] Provider Note   CSN: [id number] Arrival date & time: 04/08/22  1606     History {Add pertinent medical, surgical, social history, OB history to HPI:1} Chief Complaint  Patient presents with   Leg Swelling    Allison Short is a 72 y.o. female history of lymphedema, hypertension here presenting with worsening lymphedema and redness to the legs.  Patient states that she has chronic lymphedema.  She was admitted several years ago and has a lymphedema pump.  She states that she also follows up with lymphedema clinic.  She has worsening leg swelling for the last month or so despite interventions.  Patient also noticed more redness and drainage from the legs.  Patient is not currently on diuretics.  Patient states that she was admitted several years ago and required antibiotics for leg cellulitis.  Denies any history of blood clots  The history is provided by the patient.       Home Medications Prior to Admission medications   Medication Sig Start Date End Date Taking? Authorizing Provider  acetaminophen (TYLENOL) 500 MG tablet Take 500 mg by mouth every 6 (six) hours as needed for moderate pain.     [provider]  benazepril-hydrochlorthiazide (LOTENSIN HCT) 20-25 MG tablet Take 1 tablet by mouth daily. KEEP APPT FOR REFILLS 02/04/22   Dorothyann Peng, NP  Cholecalciferol (VITAMIN D PO) Take 1,000 Units by mouth daily.     [provider]  metoprolol tartrate (LOPRESSOR) 50 MG tablet Take 1 tablet (50 mg total) by mouth 2 (two) times daily. KEEP APPT FOR REFILLS 02/04/22 05/05/22  Dorothyann Peng, NP      Allergies    Ciprofloxin hcl [ciprofloxacin], Morphine and related, Asa [aspirin], Codeine, Nsaids, and Penicillins    Review of Systems   Review of Systems  Cardiovascular:  Positive for leg swelling.  All other systems reviewed and are negative.   Physical Exam Updated Vital Signs BP (!) 184/94   Pulse 84    Temp 97.6 F (36.4 C)   Resp 20   SpO2 98%  Physical Exam Vitals and nursing note reviewed.  Constitutional:      Comments: Chronically ill  HENT:     Head: Normocephalic.     Nose: Nose normal.     Mouth/Throat:     Mouth: Mucous membranes are dry.  Eyes:     Extraocular Movements: Extraocular movements intact.     Pupils: Pupils are equal, round, and reactive to light.  Cardiovascular:     Rate and Rhythm: Normal rate and regular rhythm.     Pulses: Normal pulses.     Heart sounds: Normal heart sounds.  Pulmonary:     Effort: Pulmonary effort is normal.     Breath sounds: Normal breath sounds.  Abdominal:     General: Abdomen is flat.     Palpations: Abdomen is soft.  Musculoskeletal:     Cervical back: Normal range of motion and neck supple.     Comments: 3+ edema bilaterally.  Patient has venous stasis changes versus cellulitis mainly on the right lower leg.  There is some clear drainage as well.  Neurological:     General: No focal deficit present.     Mental Status: She is oriented to person, place, and time.  Psychiatric:        Mood and Affect: Mood normal.        Behavior: Behavior normal.     ED Results /  Procedures / Treatments   Labs (all labs ordered are listed, but only abnormal results are displayed) Labs Reviewed  CBC  BASIC METABOLIC PANEL  TSH  HEPATIC FUNCTION PANEL  I-STAT CHEM 8, ED    EKG None  Radiology VAS [place] LOWER EXTREMITY VENOUS (DVT) (7a-7p)  Result Date: 04/08/2022  Lower Venous DVT Study Patient Name:  Allison Short  Date of Exam:   04/08/2022 Medical Rec #: [id number]   Accession #:    [id number] Date of Birth: 1951/01/08    Patient Gender: F Patient Age:   27 years Exam Location:  [facility] Procedure:      VAS [place] LOWER EXTREMITY VENOUS (DVT) Referring Phys: KEN LE --------------------------------------------------------------------------------  Indications: Edema.  Risk Factors: None identified. Limitations: Body habitus,  poor ultrasound/tissue interface and patient positioning, skin induration, lymphedema. Comparison Study: No prior studies. Performing Technologist: Oliver Hum RVT  Examination Guidelines: A complete evaluation includes B-mode imaging, spectral Doppler, color Doppler, and power Doppler as needed of all accessible portions of each vessel. Bilateral testing is considered an integral part of a complete examination. Limited examinations for reoccurring indications may be performed as noted. The reflux portion of the exam is performed with the patient in reverse Trendelenburg.  +---------+---------------+---------+-----------+----------+-------------------+ RIGHT    CompressibilityPhasicitySpontaneityPropertiesThrombus Aging      +---------+---------------+---------+-----------+----------+-------------------+ CFV      Full           Yes      Yes                                      +---------+---------------+---------+-----------+----------+-------------------+ SFJ      Full                                                             +---------+---------------+---------+-----------+----------+-------------------+ FV Prox  Full                                                             +---------+---------------+---------+-----------+----------+-------------------+ FV Mid                                                Not well visualized +---------+---------------+---------+-----------+----------+-------------------+ FV Distal                                             Not well visualized +---------+---------------+---------+-----------+----------+-------------------+ PFV                                                   Not well visualized +---------+---------------+---------+-----------+----------+-------------------+ POP      Full           Yes  No                                        +---------+---------------+---------+-----------+----------+-------------------+ PTV                                                   Not well visualized +---------+---------------+---------+-----------+----------+-------------------+ PERO                                                  Not well visualized +---------+---------------+---------+-----------+----------+-------------------+   +---------+---------------+---------+-----------+----------+-------------------+ LEFT     CompressibilityPhasicitySpontaneityPropertiesThrombus Aging      +---------+---------------+---------+-----------+----------+-------------------+ CFV      Full           Yes      Yes                                      +---------+---------------+---------+-----------+----------+-------------------+ SFJ      Full                                                             +---------+---------------+---------+-----------+----------+-------------------+ FV Prox  Full                                                             +---------+---------------+---------+-----------+----------+-------------------+ FV Mid                                                Not well visualized +---------+---------------+---------+-----------+----------+-------------------+ FV Distal                                             Not well visualized +---------+---------------+---------+-----------+----------+-------------------+ PFV                                                   Not well visualized +---------+---------------+---------+-----------+----------+-------------------+ POP                                                   Not well visualized +---------+---------------+---------+-----------+----------+-------------------+ PTV  Not well visualized +---------+---------------+---------+-----------+----------+-------------------+  PERO                                                  Not well visualized +---------+---------------+---------+-----------+----------+-------------------+     Summary: RIGHT: - The visualized veins appear compressible. Limited study.  LEFT: - The visualized veins appear compressible. Limited study.  *See table(s) above for measurements and observations.    Preliminary     Procedures Procedures  {Document cardiac monitor, telemetry assessment procedure when appropriate:1}  Angiocath insertion Performed by: Wandra Arthurs  Consent: Verbal consent obtained. Risks and benefits: risks, benefits and alternatives were discussed Time out: Immediately prior to procedure a "time out" was called to verify the correct patient, procedure, equipment, support staff and site/side marked as required.  Preparation: Patient was prepped and draped in the usual sterile fashion.  Vein Location: R antecube  Ultrasound Guided  Gauge: 20 long   Normal blood return and flush without difficulty Patient tolerance: Patient tolerated the procedure well with no immediate complications.    Medications Ordered in ED Medications  vancomycin (VANCOCIN) IVPB 1000 mg/200 mL premix (1,000 mg Intravenous New Bag/Given 04/08/22 1840)  furosemide (LASIX) injection 40 mg (40 mg Intravenous Given 04/08/22 1834)    ED Course/ Medical Decision Making/ A&P   {   Click here for ABCD2, HEART and other calculatorsREFRESH Note before signing :1}                          Medical Decision Making Allison Short is a 72 y.o. female here presenting with bilateral leg swelling.  Patient has some venous stasis changes as well.  Unclear if it is early cellulitis versus venous stasis from large lymphedema.  Plan to get CBC and CMP and diurese patient.  Will also get DVT studies.  Will also give vancomycin as well.   Risk Prescription drug management. Decision regarding hospitalization.    Final Clinical Impression(s) / ED  Diagnoses Final diagnoses:  None    Rx / DC Orders ED Discharge Orders     None

## 2022-04-08 NOTE — Assessment & Plan Note (Addendum)
BP high normal - Continue furosemide, metoprolol

## 2022-04-08 NOTE — Assessment & Plan Note (Addendum)
Low B12 - Continue B12 supplement

## 2022-04-08 NOTE — H&P (Signed)
History and Physical    Patient: Allison Short DOB: 1950/07/07 DOA: 04/08/2022 DOS: the patient was seen and examined on 04/08/2022 PCP: Dorothyann Peng, NP  Patient coming from: Home - lives alone. Uses rolling walker.    Chief Complaint: draining right lower leg   HPI: Allison Short is a 72 y.o. female with medical history significant of HTN, NHL, lymphedema who presented to ED with complaints of worsening lymphedema and redness that seems to be getting worse.  She states for the last month her swelling has gotten worse and she has continued to have worsening erythema to lower extremities. Her right leg started to drain about 3 weeks ago and got so worse it prompted her to come to ED.  She has pain with movement and the drainage has gotten progressively worse. She went to the lymphedema clinic x 3 months after her hospital discharge in 07/2020 for LE cellulitis. She was then discharged home and has been managing her lymphedema at home with her lymphedema pump and compression hose. She has not been able to wear the hose due to the drainage. She has tried to keep it as clean as possible. She has been washing with antibacterial soap and spray. She denies any trauma to her legs. Sedentary life, no long travel. Hx of blood clot >10 years ago with chemo port in her jugular vein.   She had an evisit with a provider today and was able to send pictures and they advised she come to the ED.   Denies any fever/chills, vision changes/headaches, chest pain or palpitations, shortness of breath or cough, abdominal pain, N/V/D, dysuria or leg swelling.   She does not smoke or drink alcohol.   ER Course:  vitals: afebrile, bp: 184/94, HR: 84, RR: 20, oxygen: 98%RA Pertinent labs: wbc: 10.8, mcv: 104.6, magnesium: 1.6, b12: 312 DVT study: negative In ED: given vanc and lasix. [facility] asked to admit.    Review of Systems: As mentioned in the history of present illness. All other systems reviewed and are  negative. Past Medical History:  Diagnosis Date   History of blood transfusion    2016   History of urinary tract infection    Hypertension    Non Hodgkin's lymphoma ([facility]) 2008   Obesity    Osteopenia 04/2017   T score -1.4 FRAX 7.5% / 0.8%   Pancreatitis    Past Surgical History:  Procedure Laterality Date   BIOPSY THYROID     BONE MARROW BIOPSY     CESAREAN SECTION  10/26/1975   CHOLECYSTECTOMY     2006   PORTA CATH INSERTION     TONSILLECTOMY AND ADENOIDECTOMY     1952   TOTAL KNEE ARTHROPLASTY Bilateral    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     Social History:  reports that she has never smoked. She has never used smokeless tobacco. She reports current alcohol use. She reports that she does not use drugs.  Allergies  Allergen Reactions   Ciprofloxin Hcl [Ciprofloxacin] Other (See Comments)    Unstable gait   Morphine And Related Anaphylaxis and Other (See Comments)    Tolerates hydrocodone   Asa [Aspirin] Hives and Other (See Comments)    Hives can become SEVERE, requiring intervention   Codeine Hives   Chia Oil Swelling and Other (See Comments)    Possibly "chia tea" = Face and lips became swollen   Erythromycin Other (See Comments)    Reaction not recalled   Nsaids Other (See Comments)  Lowers GFR   Penicillins Other (See Comments)    Reaction not recalled- was told to "never take this again" by family    Family History  Problem Relation Age of Onset   Arthritis Maternal Grandmother    Diabetes Son     Prior to Admission medications   Medication Sig Start Date End Date Taking? Authorizing Provider  acetaminophen (TYLENOL) 500 MG tablet Take 500 mg by mouth every 6 (six) hours as needed for moderate pain.     [provider]  benazepril-hydrochlorthiazide (LOTENSIN HCT) 20-25 MG tablet Take 1 tablet by mouth daily. KEEP APPT FOR REFILLS 02/04/22   Dorothyann Peng, NP  Cholecalciferol (VITAMIN D PO) Take 1,000 Units by mouth daily.     [provider]   metoprolol tartrate (LOPRESSOR) 50 MG tablet Take 1 tablet (50 mg total) by mouth 2 (two) times daily. KEEP APPT FOR REFILLS 02/04/22 05/05/22  Dorothyann Peng, NP    Physical Exam: Vitals:   04/08/22 1625 04/08/22 1853 04/08/22 2046 04/08/22 2133  BP: (!) 184/94 (!) 142/69 (!) 171/58 (!) 168/72  Pulse: 84 82 (!) 44 92  Resp: 20  20 18  $ Temp: 97.6 F (36.4 C) 97.6 F (36.4 C) 98.1 F (36.7 C) 98.4 F (36.9 C)  TempSrc:  Oral Oral Oral  SpO2: 98% 98% 96% 94%  Weight:   (!) 179.9 kg   Height:   5' 6"$  (1.676 m)    General:  Appears calm and comfortable and is in NAD. Obese  Eyes:  PERRL, EOMI, normal lids, iris ENT:  grossly normal hearing, lips & tongue, mmm; appropriate dentition Neck:  no LAD, masses or thyromegaly; no carotid bruits Cardiovascular:  RRR, no m/r/g. +bilateral weeping lymphedema with erythema of RLE Respiratory:   CTA bilaterally with no wheezes/rales/rhonchi.  Normal respiratory effort. Abdomen:  soft, NT, ND, NABS Back:   normal alignment, no CVAT Skin:  no rash or induration seen on limited exam Musculoskeletal:  grossly normal tone BUE/BLE, good ROM, no bony abnormality Lower extremity: bilateral lymphedema that is weeping. Erythema in BLE but >RLE.    Limited foot exam with no ulcerations.  2+ distal pulses. Psychiatric:  grossly normal mood and affect, speech fluent and appropriate, AOx3 Neurologic:  CN 2-12 grossly intact, moves all extremities in coordinated fashion, sensation intact   Radiological Exams on Admission: Independently reviewed - see discussion in A/P where applicable  VAS [place] LOWER EXTREMITY VENOUS (DVT) (7a-7p)  Result Date: 04/08/2022  Lower Venous DVT Study Patient Name:  Allison Short  Date of Exam:   04/08/2022 Medical Rec #: [id number]   Accession #:    [id number] Date of Birth: [birth year]    Patient Gender: F Patient Age:   35 years Exam Location:  [facility] Procedure:      VAS [place] LOWER EXTREMITY VENOUS (DVT) Referring Phys: KEN  LE --------------------------------------------------------------------------------  Indications: Edema.  Risk Factors: None identified. Limitations: Body habitus, poor ultrasound/tissue interface and patient positioning, skin induration, lymphedema. Comparison Study: No prior studies. Performing Technologist: Oliver Hum RVT  Examination Guidelines: A complete evaluation includes B-mode imaging, spectral Doppler, color Doppler, and power Doppler as needed of all accessible portions of each vessel. Bilateral testing is considered an integral part of a complete examination. Limited examinations for reoccurring indications may be performed as noted. The reflux portion of the exam is performed with the patient in reverse Trendelenburg.  +---------+---------------+---------+-----------+----------+-------------------+ RIGHT    CompressibilityPhasicitySpontaneityPropertiesThrombus Aging      +---------+---------------+---------+-----------+----------+-------------------+ CFV  Full           Yes      Yes                                      +---------+---------------+---------+-----------+----------+-------------------+ SFJ      Full                                                             +---------+---------------+---------+-----------+----------+-------------------+ FV Prox  Full                                                             +---------+---------------+---------+-----------+----------+-------------------+ FV Mid                                                Not well visualized +---------+---------------+---------+-----------+----------+-------------------+ FV Distal                                             Not well visualized +---------+---------------+---------+-----------+----------+-------------------+ PFV                                                   Not well visualized  +---------+---------------+---------+-----------+----------+-------------------+ POP      Full           Yes      No                                       +---------+---------------+---------+-----------+----------+-------------------+ PTV                                                   Not well visualized +---------+---------------+---------+-----------+----------+-------------------+ PERO                                                  Not well visualized +---------+---------------+---------+-----------+----------+-------------------+   +---------+---------------+---------+-----------+----------+-------------------+ LEFT     CompressibilityPhasicitySpontaneityPropertiesThrombus Aging      +---------+---------------+---------+-----------+----------+-------------------+ CFV      Full           Yes      Yes                                      +---------+---------------+---------+-----------+----------+-------------------+ SFJ  Full                                                             +---------+---------------+---------+-----------+----------+-------------------+ FV Prox  Full                                                             +---------+---------------+---------+-----------+----------+-------------------+ FV Mid                                                Not well visualized +---------+---------------+---------+-----------+----------+-------------------+ FV Distal                                             Not well visualized +---------+---------------+---------+-----------+----------+-------------------+ PFV                                                   Not well visualized +---------+---------------+---------+-----------+----------+-------------------+ POP                                                   Not well visualized +---------+---------------+---------+-----------+----------+-------------------+  PTV                                                   Not well visualized +---------+---------------+---------+-----------+----------+-------------------+ PERO                                                  Not well visualized +---------+---------------+---------+-----------+----------+-------------------+     Summary: RIGHT: - The visualized veins appear compressible. Limited study.  LEFT: - The visualized veins appear compressible. Limited study.  *See table(s) above for measurements and observations. Electronically signed by Deitra Mayo MD on 04/08/2022 at 7:06:26 PM.    Final     EKG: Independently reviewed.  NSR with rate 81; nonspecific ST changes with no evidence of acute ischemia   Labs on Admission: I have personally reviewed the available labs and imaging studies at the time of the admission.  Pertinent labs:   wbc: 10.8,  mcv: 104.6,  magnesium: 1.6,  b12: 312  Assessment and Plan: Principal Problem:   Cellulitis of lower extremity with chronic  lymphedema Active Problems:   Hypokalemia   Essential hypertension   Normocytic anemia   Non Hodgkin's lymphoma ([place])   Morbid obesity (HCC)    Assessment and  Plan: * Cellulitis of lower extremity with chronic  lymphedema 72 year old with history of bilateral lymphedema presenting with 3 weeks history of worsening drainage, redness and pain to right lower extremity found to have cellulitis of LE -obs to med surg -given vanc in ED, continue with allergy history and broad coverage -given lasix 57m in ED -wound care consult -no sepsis criteria  -negative for DVT  -likely component of venous stasis as well  -set back up with lymphedema clinic   Hypokalemia Check magnesium, if low replete  Given 226m potassium now with lasix given Trend   Essential hypertension Continue lotensin and metoprolol.   Normocytic anemia Check b12/folate  B12 low normal: start 50038mdaily   Non Hodgkin's lymphoma  (HCC) S/p chemo/radiation over 10 years ago.   Morbid obesity ([facility]CCAngelsiet/lifestyle modification     Advance Care Planning:   Code Status: Full Code   Consults: wound care   DVT Prophylaxis: lovenox   Family Communication: none   Severity of Illness: The appropriate patient status for this patient is OBSERVATION. Observation status is judged to be reasonable and necessary in order to provide the required intensity of service to ensure the patient's safety. The patient's presenting symptoms, physical exam findings, and initial radiographic and laboratory data in the context of their medical condition is felt to place them at decreased risk for further clinical deterioration. Furthermore, it is anticipated that the patient will be medically stable for discharge from the hospital within 2 midnights of admission.   Author: AllOrma FlamingD 04/08/2022 10:42 PM  For on call review [email or web address]

## 2022-04-08 NOTE — Assessment & Plan Note (Addendum)
Supplemented 

## 2022-04-09 DIAGNOSIS — F40240 Claustrophobia: Secondary | ICD-10-CM | POA: Diagnosis present

## 2022-04-09 DIAGNOSIS — J9621 Acute and chronic respiratory failure with hypoxia: Secondary | ICD-10-CM | POA: Diagnosis present

## 2022-04-09 DIAGNOSIS — I5A Non-ischemic myocardial injury (non-traumatic): Secondary | ICD-10-CM | POA: Diagnosis present

## 2022-04-09 DIAGNOSIS — G4730 Sleep apnea, unspecified: Secondary | ICD-10-CM | POA: Diagnosis not present

## 2022-04-09 DIAGNOSIS — F05 Delirium due to known physiological condition: Secondary | ICD-10-CM | POA: Diagnosis present

## 2022-04-09 DIAGNOSIS — F410 Panic disorder [episodic paroxysmal anxiety] without agoraphobia: Secondary | ICD-10-CM | POA: Diagnosis present

## 2022-04-09 DIAGNOSIS — Z833 Family history of diabetes mellitus: Secondary | ICD-10-CM | POA: Diagnosis not present

## 2022-04-09 DIAGNOSIS — C8591 Non-Hodgkin lymphoma, unspecified, lymph nodes of head, face, and neck: Secondary | ICD-10-CM

## 2022-04-09 DIAGNOSIS — D649 Anemia, unspecified: Secondary | ICD-10-CM

## 2022-04-09 DIAGNOSIS — J9612 Chronic respiratory failure with hypercapnia: Secondary | ICD-10-CM | POA: Diagnosis present

## 2022-04-09 DIAGNOSIS — J189 Pneumonia, unspecified organism: Secondary | ICD-10-CM | POA: Diagnosis present

## 2022-04-09 DIAGNOSIS — E876 Hypokalemia: Secondary | ICD-10-CM | POA: Diagnosis present

## 2022-04-09 DIAGNOSIS — D519 Vitamin B12 deficiency anemia, unspecified: Secondary | ICD-10-CM | POA: Diagnosis present

## 2022-04-09 DIAGNOSIS — Z6841 Body Mass Index (BMI) 40.0 and over, adult: Secondary | ICD-10-CM | POA: Diagnosis not present

## 2022-04-09 DIAGNOSIS — E662 Morbid (severe) obesity with alveolar hypoventilation: Secondary | ICD-10-CM | POA: Diagnosis present

## 2022-04-09 DIAGNOSIS — E873 Alkalosis: Secondary | ICD-10-CM | POA: Diagnosis present

## 2022-04-09 DIAGNOSIS — Z8261 Family history of arthritis: Secondary | ICD-10-CM | POA: Diagnosis not present

## 2022-04-09 DIAGNOSIS — I5033 Acute on chronic diastolic (congestive) heart failure: Secondary | ICD-10-CM | POA: Diagnosis present

## 2022-04-09 DIAGNOSIS — R5381 Other malaise: Secondary | ICD-10-CM | POA: Diagnosis present

## 2022-04-09 DIAGNOSIS — J9622 Acute and chronic respiratory failure with hypercapnia: Secondary | ICD-10-CM | POA: Diagnosis present

## 2022-04-09 DIAGNOSIS — I10 Essential (primary) hypertension: Secondary | ICD-10-CM

## 2022-04-09 DIAGNOSIS — Z87440 Personal history of urinary (tract) infections: Secondary | ICD-10-CM | POA: Diagnosis not present

## 2022-04-09 DIAGNOSIS — J440 Chronic obstructive pulmonary disease with acute lower respiratory infection: Secondary | ICD-10-CM | POA: Diagnosis present

## 2022-04-09 DIAGNOSIS — Z889 Allergy status to unspecified drugs, medicaments and biological substances status: Secondary | ICD-10-CM | POA: Diagnosis not present

## 2022-04-09 DIAGNOSIS — I890 Lymphedema, not elsewhere classified: Secondary | ICD-10-CM | POA: Diagnosis present

## 2022-04-09 DIAGNOSIS — I110 Hypertensive heart disease with heart failure: Secondary | ICD-10-CM | POA: Diagnosis present

## 2022-04-09 DIAGNOSIS — L03115 Cellulitis of right lower limb: Secondary | ICD-10-CM | POA: Diagnosis present

## 2022-04-09 DIAGNOSIS — Y95 Nosocomial condition: Secondary | ICD-10-CM | POA: Diagnosis present

## 2022-04-09 DIAGNOSIS — J9611 Chronic respiratory failure with hypoxia: Secondary | ICD-10-CM | POA: Diagnosis not present

## 2022-04-09 LAB — CBC
HCT: 37.1 % (ref 36.0–46.0)
Hemoglobin: 11.7 g/dL — ABNORMAL LOW (ref 12.0–15.0)
MCH: 33.1 pg (ref 26.0–34.0)
MCHC: 31.5 g/dL (ref 30.0–36.0)
MCV: 104.8 fL — ABNORMAL HIGH (ref 80.0–100.0)
Platelets: 217 10*3/uL (ref 150–400)
RBC: 3.54 MIL/uL — ABNORMAL LOW (ref 3.87–5.11)
RDW: 15.2 % (ref 11.5–15.5)
WBC: 8.3 10*3/uL (ref 4.0–10.5)
nRBC: 0 % (ref 0.0–0.2)

## 2022-04-09 LAB — BASIC METABOLIC PANEL
Anion gap: 11 (ref 5–15)
BUN: 23 mg/dL (ref 8–23)
CO2: 30 mmol/L (ref 22–32)
Calcium: 8.5 mg/dL — ABNORMAL LOW (ref 8.9–10.3)
Chloride: 99 mmol/L (ref 98–111)
Creatinine, Ser: 0.97 mg/dL (ref 0.44–1.00)
GFR, Estimated: >60 mL/min (ref >60)
Glucose, Bld: 154 mg/dL — ABNORMAL HIGH (ref 70–99)
Potassium: 3.4 mmol/L — ABNORMAL LOW (ref 3.5–5.1)
Sodium: 140 mmol/L (ref 135–145)

## 2022-04-09 LAB — MAGNESIUM: Magnesium: 2.1 mg/dL (ref 1.7–2.4)

## 2022-04-09 MED ORDER — PANTOPRAZOLE SODIUM 40 MG PO TBEC
40.0000 mg | DELAYED_RELEASE_TABLET | Freq: Every day | ORAL | Status: DC
  Administered 2022-04-09 – 2022-04-29 (×21): 40 mg via ORAL
  Filled 2022-04-09 (×21): qty 1

## 2022-04-09 MED ORDER — POTASSIUM CHLORIDE CRYS ER 10 MEQ PO TBCR
40.0000 meq | EXTENDED_RELEASE_TABLET | Freq: Once | ORAL | Status: AC
  Administered 2022-04-09: 40 meq via ORAL
  Filled 2022-04-09: qty 4

## 2022-04-09 MED ORDER — SALINE SPRAY 0.65 % NA SOLN
1.0000 | NASAL | Status: DC | PRN
  Administered 2022-04-13: 1 via NASAL
  Filled 2022-04-09 (×2): qty 44

## 2022-04-09 MED ORDER — FUROSEMIDE 10 MG/ML IJ SOLN
40.0000 mg | Freq: Once | INTRAMUSCULAR | Status: AC
  Administered 2022-04-09: 40 mg via INTRAVENOUS
  Filled 2022-04-09: qty 4

## 2022-04-09 MED ORDER — IPRATROPIUM-ALBUTEROL 0.5-2.5 (3) MG/3ML IN SOLN
3.0000 mL | Freq: Three times a day (TID) | RESPIRATORY_TRACT | Status: DC
  Administered 2022-04-09 – 2022-04-14 (×15): 3 mL via RESPIRATORY_TRACT
  Filled 2022-04-09 (×15): qty 3

## 2022-04-09 MED ORDER — OXYMETAZOLINE HCL 0.05 % NA SOLN
1.0000 | Freq: Two times a day (BID) | NASAL | Status: AC | PRN
  Filled 2022-04-09: qty 15

## 2022-04-09 MED ORDER — LORATADINE 10 MG PO TABS
10.0000 mg | ORAL_TABLET | Freq: Every day | ORAL | Status: DC
  Administered 2022-04-09 – 2022-04-29 (×21): 10 mg via ORAL
  Filled 2022-04-09 (×21): qty 1

## 2022-04-09 MED ORDER — SALINE SPRAY 0.65 % NA SOLN: 1.0000 | NASAL | Status: DC | PRN

## 2022-04-09 NOTE — Progress Notes (Signed)
PROGRESS NOTE    Allison Short  [id number] DOB: 1950-03-05 DOA: 04/08/2022 PCP: Allison Peng, NP    Chief Complaint  Patient presents with   Leg Swelling    Brief Narrative:  Patient 72 year old female history of hypertension, NHL, lymphedema presented to the ED with complaints of worsening lymphedema, redness which seem to be worsening with some erythema and heat as well as increased weeping and draining.  Patient states ongoing drainage despite using lymphedema pump and compression hose.  Patient unable to wear closed due to excessive drainage.  Patient presented to the ED, lower extremity Dopplers done negative for DVT.  Patient admitted due to concerns for probable lower extremity cellulitis.   Assessment & Plan:   Principal Problem:   Cellulitis of lower extremity with chronic  lymphedema Active Problems:   Hypokalemia   Essential hypertension   Normocytic anemia   Non Hodgkin's lymphoma ([place])   Morbid obesity (HCC)  #1 cellulitis of lower extremity with chronic lymphedema -Patient with history of bilateral lymphedema presented 3-week history of worsening drainage, erythema, pain to right lower extremity with concerns for cellulitis of the right lower extremity. -Patient admitted. -Lower extremity Dopplers negative for DVT. -Patient noted to have received vancomycin and Lasix on presentation to the ED with a urine output of 1.9 L recorded over the past 24 hours. -Some improvement with weeping from lower extremities. -Patient currently afebrile, leukocytosis improved. -Continue IV vancomycin. -Will give another dose of Lasix 40 mg IV x 1. -PT/OT. -Follow.  2.  Hypokalemia -Replete.  3.  Hypertension -Continue Lotensin/HCTZ, Lopressor.  4.  Expiratory wheezing -Noted on examination. -Patient denies any prior history of tobacco use. -Patient states has chronic postnasal drip leading to her cough and likely they are wheezing. -Place on Claritin, Afrin nasal twice  daily as needed, [place] nasal spray as needed. -Placed on scheduled DuoNebs.  5.  Normocytic anemia -Patient noted to have low vitamin B12 levels and started on oral vitamin B12 supplementation.  6.  Non-Hodgkin's lymphoma -Status post chemo/radiation about 10 years ago. -Outpatient follow-up.  7.  Morbid obesity -Lifestyle modification. -Outpatient follow-up with PCP.   DVT prophylaxis: Lovenox Code Status: Full Family Communication: Updated patient.  No family at bedside. Disposition: TBD  Status is: Inpatient The patient will require care spanning > 2 midnights and should be moved to inpatient because: Severity of illness   Consultants:  None  Procedures: Lower extremity Dopplers 04/08/2022  Antimicrobials:  IV vancomycin 04/08/2022>>>>    Subjective: Laying in bed.  Complaining of nasal congestion and postnasal drip.  States lower extremity weeping with some improvement since admission.  Still with some warmth and tenderness to palpation.  No chest pain.  No shortness of breath.  Objective: Vitals:   04/08/22 2133 04/09/22 0021 04/09/22 0417 04/09/22 0910  BP: (!) 168/72 (!) 116/52 110/65 (!) 130/53  Pulse: 92 81 71 79  Resp: 18 18 19 20  $ Temp: 98.4 F (36.9 C) 98.8 F (37.1 C) 98 F (36.7 C) 97.9 F (36.6 C)  TempSrc: Oral Oral Oral Oral  SpO2: 94% 91% 94% 97%  Weight:      Height:        Intake/Output Summary (Last 24 hours) at 04/09/2022 1350 Last data filed at 04/09/2022 0559 Gross per 24 hour  Intake 1128.88 ml  Output 1900 ml  Net -771.12 ml   Filed Weights   04/08/22 2046  Weight: (!) 179.9 kg    Examination:  General exam: Appears calm and comfortable  Respiratory system: Patient with diffuse wheezing.  No rhonchi.  Fair air movement.  Speaking in full sentences.   Cardiovascular system: S1 & S2 heard, RRR. No JVD, murmurs, rubs, gallops or clicks.  Bilateral lower extremity edema. Gastrointestinal system: Abdomen is nondistended, soft and  nontender. No organomegaly or masses felt. Normal bowel sounds heard. Central nervous system: Alert and oriented. No focal neurological deficits. Extremities: Bilateral lower extremity lymphedema with decreased weeping, erythema bilateral lower extremities R > L.  Skin: Bilateral lower extremity with lymphedema, erythema, some warmth, decreased tenderness to palpation.  Psychiatry: Judgement and insight appear normal. Mood & affect appropriate.     Data Reviewed: I have personally reviewed following labs and imaging studies  CBC: Recent Labs  Lab 04/08/22 1852 04/08/22 1918 04/09/22 0435  WBC  --  10.8* 8.3  HGB 13.6 12.7 11.7*  HCT 40.0 40.6 37.1  MCV  --  104.6* 104.8*  PLT  --  253 [id number]    Basic Metabolic Panel: Recent Labs  Lab 04/08/22 1852 04/08/22 1918 04/08/22 2111 04/09/22 0435  NA 141 138  --  140  K 3.4* 3.5  --  3.4*  CL 96* 96*  --  99  CO2  --  30  --  30  GLUCOSE 147* 144*  --  154*  BUN 28* 23  --  23  CREATININE 0.90 0.99  --  0.97  CALCIUM  --  9.0  --  8.5*  MG  --   --  1.6* 2.1    GFR: Estimated Creatinine Clearance: 87.7 mL/min (by C-G formula based on SCr of 0.97 mg/dL).  Liver Function Tests: Recent Labs  Lab 04/08/22 1918  AST 23  ALT 17  ALKPHOS 81  BILITOT 1.2  PROT 7.1  ALBUMIN 3.9    CBG: No results for input(s): "GLUCAP" in the last 168 hours.   No results found for this or any previous visit (from the past 240 hour(s)).       Radiology Studies: VAS [place] LOWER EXTREMITY VENOUS (DVT) (7a-7p)  Result Date: 04/08/2022  Lower Venous DVT Study Patient Name:  Allison Short  Date of Exam:   04/08/2022 Medical Rec #: [id number]   Accession #:    [id number] Date of Birth: October 13, 1950    Patient Gender: F Patient Age:   39 years Exam Location:  [facility] Procedure:      VAS [place] LOWER EXTREMITY VENOUS (DVT) Referring Phys: KEN LE --------------------------------------------------------------------------------  Indications: Edema.   Risk Factors: None identified. Limitations: Body habitus, poor ultrasound/tissue interface and patient positioning, skin induration, lymphedema. Comparison Study: No prior studies. Performing Technologist: Oliver Hum RVT  Examination Guidelines: A complete evaluation includes B-mode imaging, spectral Doppler, color Doppler, and power Doppler as needed of all accessible portions of each vessel. Bilateral testing is considered an integral part of a complete examination. Limited examinations for reoccurring indications may be performed as noted. The reflux portion of the exam is performed with the patient in reverse Trendelenburg.  +---------+---------------+---------+-----------+----------+-------------------+ RIGHT    CompressibilityPhasicitySpontaneityPropertiesThrombus Aging      +---------+---------------+---------+-----------+----------+-------------------+ CFV      Full           Yes      Yes                                      +---------+---------------+---------+-----------+----------+-------------------+ SFJ      Full                                                             +---------+---------------+---------+-----------+----------+-------------------+  FV Prox  Full                                                             +---------+---------------+---------+-----------+----------+-------------------+ FV Mid                                                Not well visualized +---------+---------------+---------+-----------+----------+-------------------+ FV Distal                                             Not well visualized +---------+---------------+---------+-----------+----------+-------------------+ PFV                                                   Not well visualized +---------+---------------+---------+-----------+----------+-------------------+ POP      Full           Yes      No                                        +---------+---------------+---------+-----------+----------+-------------------+ PTV                                                   Not well visualized +---------+---------------+---------+-----------+----------+-------------------+ PERO                                                  Not well visualized +---------+---------------+---------+-----------+----------+-------------------+   +---------+---------------+---------+-----------+----------+-------------------+ LEFT     CompressibilityPhasicitySpontaneityPropertiesThrombus Aging      +---------+---------------+---------+-----------+----------+-------------------+ CFV      Full           Yes      Yes                                      +---------+---------------+---------+-----------+----------+-------------------+ SFJ      Full                                                             +---------+---------------+---------+-----------+----------+-------------------+ FV Prox  Full                                                             +---------+---------------+---------+-----------+----------+-------------------+  FV Mid                                                Not well visualized +---------+---------------+---------+-----------+----------+-------------------+ FV Distal                                             Not well visualized +---------+---------------+---------+-----------+----------+-------------------+ PFV                                                   Not well visualized +---------+---------------+---------+-----------+----------+-------------------+ POP                                                   Not well visualized +---------+---------------+---------+-----------+----------+-------------------+ PTV                                                   Not well visualized +---------+---------------+---------+-----------+----------+-------------------+  PERO                                                  Not well visualized +---------+---------------+---------+-----------+----------+-------------------+     Summary: RIGHT: - The visualized veins appear compressible. Limited study.  LEFT: - The visualized veins appear compressible. Limited study.  *See table(s) above for measurements and observations. Electronically signed by Deitra Mayo MD on 04/08/2022 at 7:06:26 PM.    Final         Scheduled Meds:  benazepril  20 mg Oral Daily   And   hydrochlorothiazide  25 mg Oral Daily   vitamin B-12  500 mcg Oral Daily   enoxaparin (LOVENOX) injection  80 mg Subcutaneous Q24H   loratadine  10 mg Oral Daily   metoprolol tartrate  50 mg Oral BID   pantoprazole  40 mg Oral Daily   Continuous Infusions:  vancomycin 1,000 mg (04/09/22 1210)     LOS: 0 days    Time spent: 40 minutes    Irine Seal, MD [facility]   To contact the attending provider between 7A-7P or the covering provider during after hours 7P-7A, please log into the web site [email or web address] and access using universal [place] password for that web site. If you do not have the password, please call the hospital operator.  04/09/2022, 1:50 PM

## 2022-04-10 DIAGNOSIS — I10 Essential (primary) hypertension: Secondary | ICD-10-CM | POA: Diagnosis not present

## 2022-04-10 DIAGNOSIS — L03115 Cellulitis of right lower limb: Secondary | ICD-10-CM | POA: Diagnosis not present

## 2022-04-10 DIAGNOSIS — E876 Hypokalemia: Secondary | ICD-10-CM | POA: Diagnosis not present

## 2022-04-10 DIAGNOSIS — C8591 Non-Hodgkin lymphoma, unspecified, lymph nodes of head, face, and neck: Secondary | ICD-10-CM | POA: Diagnosis not present

## 2022-04-10 LAB — CBC WITH DIFFERENTIAL/PLATELET
Abs Immature Granulocytes: 0.02 10*3/uL (ref 0.00–0.07)
Basophils Absolute: 0 10*3/uL (ref 0.0–0.1)
Basophils Relative: 0 %
Eosinophils Absolute: 0.2 10*3/uL (ref 0.0–0.5)
Eosinophils Relative: 3 %
HCT: 36.9 % (ref 36.0–46.0)
Hemoglobin: 11.2 g/dL — ABNORMAL LOW (ref 12.0–15.0)
Immature Granulocytes: 0 %
Lymphocytes Relative: 17 %
Lymphs Abs: 1.4 10*3/uL (ref 0.7–4.0)
MCH: 32.4 pg (ref 26.0–34.0)
MCHC: 30.4 g/dL (ref 30.0–36.0)
MCV: 106.6 fL — ABNORMAL HIGH (ref 80.0–100.0)
Monocytes Absolute: 0.7 10*3/uL (ref 0.1–1.0)
Monocytes Relative: 9 %
Neutro Abs: 6 10*3/uL (ref 1.7–7.7)
Neutrophils Relative %: 71 %
Platelets: 213 10*3/uL (ref 150–400)
RBC: 3.46 MIL/uL — ABNORMAL LOW (ref 3.87–5.11)
RDW: 15.4 % (ref 11.5–15.5)
WBC: 8.4 10*3/uL (ref 4.0–10.5)
nRBC: 0 % (ref 0.0–0.2)

## 2022-04-10 LAB — BASIC METABOLIC PANEL
Anion gap: 9 (ref 5–15)
BUN: 20 mg/dL (ref 8–23)
CO2: 32 mmol/L (ref 22–32)
Calcium: 8.4 mg/dL — ABNORMAL LOW (ref 8.9–10.3)
Chloride: 94 mmol/L — ABNORMAL LOW (ref 98–111)
Creatinine, Ser: 1.04 mg/dL — ABNORMAL HIGH (ref 0.44–1.00)
GFR, Estimated: 57 mL/min — ABNORMAL LOW (ref >60)
Glucose, Bld: 136 mg/dL — ABNORMAL HIGH (ref 70–99)
Potassium: 3.5 mmol/L (ref 3.5–5.1)
Sodium: 135 mmol/L (ref 135–145)

## 2022-04-10 MED ORDER — POTASSIUM CHLORIDE CRYS ER 10 MEQ PO TBCR
40.0000 meq | EXTENDED_RELEASE_TABLET | Freq: Once | ORAL | Status: AC
  Administered 2022-04-10: 40 meq via ORAL
  Filled 2022-04-10: qty 4

## 2022-04-10 MED ORDER — FUROSEMIDE 10 MG/ML IJ SOLN
40.0000 mg | Freq: Once | INTRAMUSCULAR | Status: AC
  Administered 2022-04-10: 40 mg via INTRAVENOUS
  Filled 2022-04-10: qty 4

## 2022-04-10 NOTE — Progress Notes (Signed)
PROGRESS NOTE    Allison Short  [id number] DOB: 1950-07-08 DOA: 04/08/2022 PCP: Dorothyann Peng, NP    Chief Complaint  Patient presents with   Leg Swelling    Brief Narrative:  Patient 72 year old female history of hypertension, NHL, lymphedema presented to the ED with complaints of worsening lymphedema, redness which seem to be worsening with some erythema and heat as well as increased weeping and draining.  Patient states ongoing drainage despite using lymphedema pump and compression hose.  Patient unable to wear closed due to excessive drainage.  Patient presented to the ED, lower extremity Dopplers done negative for DVT.  Patient admitted due to concerns for probable lower extremity cellulitis.   Assessment & Plan:   Principal Problem:   Cellulitis of lower extremity with chronic  lymphedema Active Problems:   Hypokalemia   Essential hypertension   Normocytic anemia   Non Hodgkin's lymphoma ([place])   Morbid obesity (HCC)  #1 cellulitis of lower extremity with chronic lymphedema -Patient with history of bilateral lymphedema presented 3-week history of worsening drainage, erythema, pain to right lower extremity with concerns for cellulitis of the right lower extremity. -Patient admitted. -Lower extremity Dopplers negative for DVT. -Patient noted to have received vancomycin and Lasix on presentation to the ED with a urine output of 1.9 L recorded over the past 24 hours. -Some improvement with weeping from lower extremities. -Patient currently afebrile, leukocytosis improved. -Continue IV vancomycin. -Patient received Lasix 40 mg IV x 1 yesterday with urine output of approximately 4 L over the past 24 hours. -Clinical improvement. -Give another dose of Lasix 40 mg IV x 1. -PT/OT. -Follow.  2.  Hypokalemia -Replete.  3.  Hypertension -Continue Lotensin/HCTZ, Lopressor.  Patient received a dose of IV Lasix today.    4.  Expiratory wheezing -Noted on examination. -Patient  denies any prior history of tobacco use. -Patient states has chronic postnasal drip leading to her cough and likely they are wheezing. -Wheezing is slowly improving. -Continue Claritin, Afrin nasal twice daily as needed Ocean nasal spray as needed, scheduled DuoNebs. -Patient also given IV Lasix. -Will give a dose of IV prednisone x 1.  5.  Normocytic anemia -Patient noted to have low vitamin B12 levels and started on oral vitamin B12 supplementation. -Hemoglobin stable.  6.  Non-Hodgkin's lymphoma -Status post chemo/radiation about 10 years ago. -Outpatient follow-up.  7.  Morbid obesity -Lifestyle modification. -Outpatient follow-up with PCP.   DVT prophylaxis: Lovenox Code Status: Full Family Communication: Updated patient.  No family at bedside. Disposition: TBD  Status is: Inpatient The patient will require care spanning > 2 midnights and should be moved to inpatient because: Severity of illness   Consultants:  None  Procedures: Lower extremity Dopplers 04/08/2022  Antimicrobials:  IV vancomycin 04/08/2022>>>>    Subjective: Laying in bed.  Feels wheezing improved with breathing treatments.  Still with chronic nasal congestion and postnasal drip.  Denies any chest pain.  No abdominal pain.  Feels swelling and erythema in legs have improved with diuretics.   Objective: Vitals:   04/10/22 0630 04/10/22 0921 04/10/22 1313 04/10/22 1336  BP: (!) 160/66   (!) 121/47  Pulse: 73   77  Resp: 16     Temp: 98 F (36.7 C)   98.7 F (37.1 C)  TempSrc: Oral   Oral  SpO2: 100% 90% 97% 95%  Weight:      Height:        Intake/Output Summary (Last 24 hours) at 04/10/2022 1630 Last data  filed at 04/10/2022 1349 Gross per 24 hour  Intake 1810 ml  Output 5010 ml  Net -3200 ml    Filed Weights   04/08/22 2046  Weight: (!) 179.9 kg    Examination:  General exam: NAD. Respiratory system: Decreased diffuse wheezing.  No rhonchi.  Fair air movement.  Speaking in full  sentences.    Cardiovascular system: Regular rate rhythm no murmurs rubs or gallops.  No JVD.  Improving bilateral lower extremity edema.  Gastrointestinal system: Abdomen is soft, nontender, nondistended, positive bowel sounds.  No rebound.  No guarding.  Central nervous system: Alert and oriented. No focal neurological deficits. Extremities: Bilateral lower extremity lymphedema with decreased weeping, decreased erythema, R > L.  Skin: Bilateral lower extremity with lymphedema, decreased erythema, decreased warmth, decreased tenderness to palpation.  Psychiatry: Judgement and insight appear normal. Mood & affect appropriate.     Data Reviewed: I have personally reviewed following labs and imaging studies  CBC: Recent Labs  Lab 04/08/22 1852 04/08/22 1918 04/09/22 0435 04/10/22 0424  WBC  --  10.8* 8.3 8.4  NEUTROABS  --   --   --  6.0  HGB 13.6 12.7 11.7* 11.2*  HCT 40.0 40.6 37.1 36.9  MCV  --  104.6* 104.8* 106.6*  PLT  --  253 217 213     Basic Metabolic Panel: Recent Labs  Lab 04/08/22 1852 04/08/22 1918 04/08/22 2111 04/09/22 0435 04/10/22 0424  NA 141 138  --  140 135  K 3.4* 3.5  --  3.4* 3.5  CL 96* 96*  --  99 94*  CO2  --  30  --  30 32  GLUCOSE 147* 144*  --  154* 136*  BUN 28* 23  --  23 20  CREATININE 0.90 0.99  --  0.97 1.04*  CALCIUM  --  9.0  --  8.5* 8.4*  MG  --   --  1.6* 2.1  --      GFR: Estimated Creatinine Clearance: 81.8 mL/min (A) (by C-G formula based on SCr of 1.04 mg/dL (H)).  Liver Function Tests: Recent Labs  Lab 04/08/22 1918  AST 23  ALT 17  ALKPHOS 81  BILITOT 1.2  PROT 7.1  ALBUMIN 3.9     CBG: No results for input(s): "GLUCAP" in the last 168 hours.   No results found for this or any previous visit (from the past 240 hour(s)).       Radiology Studies: VAS [place] LOWER EXTREMITY VENOUS (DVT) (7a-7p)  Result Date: 04/08/2022  Lower Venous DVT Study Patient Name:  Allison Short  Date of Exam:   04/08/2022 Medical  Rec #: [id number]   Accession #:    [id number] Date of Birth: 1950/03/01    Patient Gender: F Patient Age:   12 years Exam Location:  [facility] Procedure:      VAS [place] LOWER EXTREMITY VENOUS (DVT) Referring Phys: KEN LE --------------------------------------------------------------------------------  Indications: Edema.  Risk Factors: None identified. Limitations: Body habitus, poor ultrasound/tissue interface and patient positioning, skin induration, lymphedema. Comparison Study: No prior studies. Performing Technologist: Oliver Hum RVT  Examination Guidelines: A complete evaluation includes B-mode imaging, spectral Doppler, color Doppler, and power Doppler as needed of all accessible portions of each vessel. Bilateral testing is considered an integral part of a complete examination. Limited examinations for reoccurring indications may be performed as noted. The reflux portion of the exam is performed with the patient in reverse Trendelenburg.  +---------+---------------+---------+-----------+----------+-------------------+ RIGHT  CompressibilityPhasicitySpontaneityPropertiesThrombus Aging      +---------+---------------+---------+-----------+----------+-------------------+ CFV      Full           Yes      Yes                                      +---------+---------------+---------+-----------+----------+-------------------+ SFJ      Full                                                             +---------+---------------+---------+-----------+----------+-------------------+ FV Prox  Full                                                             +---------+---------------+---------+-----------+----------+-------------------+ FV Mid                                                Not well visualized +---------+---------------+---------+-----------+----------+-------------------+ FV Distal                                             Not well visualized  +---------+---------------+---------+-----------+----------+-------------------+ PFV                                                   Not well visualized +---------+---------------+---------+-----------+----------+-------------------+ POP      Full           Yes      No                                       +---------+---------------+---------+-----------+----------+-------------------+ PTV                                                   Not well visualized +---------+---------------+---------+-----------+----------+-------------------+ PERO                                                  Not well visualized +---------+---------------+---------+-----------+----------+-------------------+   +---------+---------------+---------+-----------+----------+-------------------+ LEFT     CompressibilityPhasicitySpontaneityPropertiesThrombus Aging      +---------+---------------+---------+-----------+----------+-------------------+ CFV      Full           Yes      Yes                                      +---------+---------------+---------+-----------+----------+-------------------+  SFJ      Full                                                             +---------+---------------+---------+-----------+----------+-------------------+ FV Prox  Full                                                             +---------+---------------+---------+-----------+----------+-------------------+ FV Mid                                                Not well visualized +---------+---------------+---------+-----------+----------+-------------------+ FV Distal                                             Not well visualized +---------+---------------+---------+-----------+----------+-------------------+ PFV                                                   Not well visualized +---------+---------------+---------+-----------+----------+-------------------+  POP                                                   Not well visualized +---------+---------------+---------+-----------+----------+-------------------+ PTV                                                   Not well visualized +---------+---------------+---------+-----------+----------+-------------------+ PERO                                                  Not well visualized +---------+---------------+---------+-----------+----------+-------------------+     Summary: RIGHT: - The visualized veins appear compressible. Limited study.  LEFT: - The visualized veins appear compressible. Limited study.  *See table(s) above for measurements and observations. Electronically signed by Deitra Mayo MD on 04/08/2022 at 7:06:26 PM.    Final         Scheduled Meds:  benazepril  20 mg Oral Daily   And   hydrochlorothiazide  25 mg Oral Daily   vitamin B-12  500 mcg Oral Daily   enoxaparin (LOVENOX) injection  80 mg Subcutaneous Q24H   ipratropium-albuterol  3 mL Nebulization TID   loratadine  10 mg Oral Daily   metoprolol tartrate  50 mg Oral BID   pantoprazole  40 mg Oral Daily   Continuous Infusions:  vancomycin 1,000 mg (04/10/22 1208)     LOS: 1 day  Time spent: 40 minutes    Irine Seal, MD [facility]   To contact the attending provider between 7A-7P or the covering provider during after hours 7P-7A, please log into the web site [email or web address] and access using universal [place] password for that web site. If you do not have the password, please call the hospital operator.  04/10/2022, 4:30 PM

## 2022-04-11 DIAGNOSIS — E876 Hypokalemia: Secondary | ICD-10-CM | POA: Diagnosis not present

## 2022-04-11 DIAGNOSIS — L03115 Cellulitis of right lower limb: Secondary | ICD-10-CM | POA: Diagnosis not present

## 2022-04-11 DIAGNOSIS — C8591 Non-Hodgkin lymphoma, unspecified, lymph nodes of head, face, and neck: Secondary | ICD-10-CM | POA: Diagnosis not present

## 2022-04-11 DIAGNOSIS — I10 Essential (primary) hypertension: Secondary | ICD-10-CM | POA: Diagnosis not present

## 2022-04-11 LAB — CBC
HCT: 39.5 % (ref 36.0–46.0)
Hemoglobin: 11.9 g/dL — ABNORMAL LOW (ref 12.0–15.0)
MCH: 31.9 pg (ref 26.0–34.0)
MCHC: 30.1 g/dL (ref 30.0–36.0)
MCV: 105.9 fL — ABNORMAL HIGH (ref 80.0–100.0)
Platelets: 217 10*3/uL (ref 150–400)
RBC: 3.73 MIL/uL — ABNORMAL LOW (ref 3.87–5.11)
RDW: 15.1 % (ref 11.5–15.5)
WBC: 8.9 10*3/uL (ref 4.0–10.5)
nRBC: 0 % (ref 0.0–0.2)

## 2022-04-11 LAB — BASIC METABOLIC PANEL
Anion gap: 13 (ref 5–15)
BUN: 19 mg/dL (ref 8–23)
CO2: 33 mmol/L — ABNORMAL HIGH (ref 22–32)
Calcium: 8.6 mg/dL — ABNORMAL LOW (ref 8.9–10.3)
Chloride: 89 mmol/L — ABNORMAL LOW (ref 98–111)
Creatinine, Ser: 0.96 mg/dL (ref 0.44–1.00)
GFR, Estimated: >60 mL/min (ref >60)
Glucose, Bld: 164 mg/dL — ABNORMAL HIGH (ref 70–99)
Potassium: 3.7 mmol/L (ref 3.5–5.1)
Sodium: 135 mmol/L (ref 135–145)

## 2022-04-11 MED ORDER — DOXYCYCLINE HYCLATE 100 MG PO TABS
100.0000 mg | ORAL_TABLET | Freq: Two times a day (BID) | ORAL | Status: AC
  Administered 2022-04-11 – 2022-04-15 (×10): 100 mg via ORAL
  Filled 2022-04-11 (×10): qty 1

## 2022-04-11 NOTE — Consult Note (Signed)
[place] Nurse Consult Note: Reason for Consult: weeping lymphedema Was last seen by [facility] nurse in 2022 for same Wound type: lymphedema and associated cellulitis  Pressure Injury POA:NA Measurement: intense redness of the bilateral LEs with blistering, serous blistering  Wound bed:NA Drainage (amount, consistency, odor) serous Periwound: edema consistent with lymphedema  Dressing procedure/placement/frequency: Single layer of xeroform to the weeping or open areas, top with ABD pads, secure with kerlix. Wrap with ACE wraps (4") from toes to knees.   Lymphedema wraps and manual massage are not in the scope of practice for the [place] inpatient nursing team   Consider re-referring to lymphedema treatment clinic of patient's choice.   Lymphedema  Resources (updated 12/2020) Each site requires a referral from your primary care MD [facility] [address], [place]  [phone] (Upper extremities)  [place], [place] [phone] (Lower extremities, PATIENT CAN NOT HAVE A WOUND)  [place] [place], [place] [address] [phone]  [place], [phone] [place], [place] [phone]  [facility] [address], [place] [address] [phone]  [facility]  (only treatment for lymphedema related to cancer diagnosis) [place], [place] [address] [phone]    [facility] [place], [place] [address] [phone] [facility] [place]) [phone] [address],  [phone] [phone]    Re consult if needed, will not follow at this time. Thanks  Christyna Letendre [employer], RN,CWOCN, CNS, [place] [phone])

## 2022-04-11 NOTE — Progress Notes (Signed)
PROGRESS NOTE    Allison Short  [id number] DOB: [birth year] DOA: 04/08/2022 PCP: Dorothyann Peng, NP    Chief Complaint  Patient presents with   Leg Swelling    Brief Narrative:  Patient 72 year old female history of hypertension, NHL, lymphedema presented to the ED with complaints of worsening lymphedema, redness which seem to be worsening with some erythema and heat as well as increased weeping and draining.  Patient states ongoing drainage despite using lymphedema pump and compression hose.  Patient unable to wear closed due to excessive drainage.  Patient presented to the ED, lower extremity Dopplers done negative for DVT.  Patient admitted due to concerns for probable lower extremity cellulitis.   Assessment & Plan:   Principal Problem:   Cellulitis of lower extremity with chronic  lymphedema Active Problems:   Hypokalemia   Essential hypertension   Normocytic anemia   Non Hodgkin's lymphoma ([place])   Morbid obesity (HCC)  #1 cellulitis of lower extremity with chronic lymphedema -Patient with history of bilateral lymphedema presented 3-week history of worsening drainage, erythema, pain to right lower extremity with concerns for cellulitis of the right lower extremity. -Patient admitted. -Lower extremity Dopplers negative for DVT. -Patient noted to have received vancomycin and Lasix on presentation to the ED with a urine output of 1.9 L. -Some improvement with weeping from lower extremities. -Afebrile.  Improvement in leukocytosis.   -Patient received doses of Lasix 40 mg IV over the past 2 days, urine output of 5.5 L over the past 24 hours.  Patient is -7.7 L during this hospitalization.  -Transition from IV vancomycin to oral doxycycline to complete a 7 to 10-day course of treatment. -PT/OT. -Follow.  2.  Hypokalemia -Repleted.  3.  Hypertension -Continue Lotensin/HCTZ, Lopressor.   4.  Expiratory wheezing -Noted on examination. -Patient denies any prior history of  tobacco use. -Patient states has chronic postnasal drip leading to her cough and likely they are wheezing. -Wheezing improved.  -Continue Claritin, Afrin nasal twice daily as needed Ocean nasal spray as needed, scheduled DuoNebs. -Status post IV Lasix.   5.  Normocytic anemia -Patient noted to have low vitamin B12 levels and started on oral vitamin B12 supplementation. -Hemoglobin stable at 11.9.  6.  Non-Hodgkin's lymphoma -Status post chemo/radiation about 10 years ago. -Outpatient follow-up.  7.  Morbid obesity -Lifestyle modification. -Outpatient follow-up with PCP.   DVT prophylaxis: Lovenox Code Status: Full Family Communication: Updated patient.  No family at bedside. Disposition: TBD  Status is: Inpatient The patient will require care spanning > 2 midnights and should be moved to inpatient because: Severity of illness   Consultants:  [place], RN Melody Liane Comber 04/11/2022  Procedures: Lower extremity Dopplers 04/08/2022  Antimicrobials:  IV vancomycin 04/08/2022>>>> 04/11/2022 Oral doxycycline 04/11/2022>>>>    Subjective: Patient sitting up in bed.  Denies any chest pain.  Feels coughing, wheezing has improved on current breathing treatments.  Denies any chest pain.  No abdominal pain.  Lower extremity erythema, pain and swelling improving.   Objective: Vitals:   04/10/22 2007 04/10/22 2126 04/11/22 0522 04/11/22 0800  BP: (!) 141/64  (!) 159/70   Pulse: 83  72   Resp: 16  16   Temp: 99.9 F (37.7 C)  98.4 F (36.9 C)   TempSrc: Oral  Oral   SpO2: 96% 96% 92% 96%  Weight:      Height:        Intake/Output Summary (Last 24 hours) at 04/11/2022 1155 Last data filed at 04/11/2022 [phone]  Gross per 24 hour  Intake 800 ml  Output 4800 ml  Net -4000 ml    Filed Weights   04/08/22 2046  Weight: (!) 179.9 kg    Examination:  General exam: NAD. Respiratory system: Decreased diffuse wheezing.  No rhonchi.  Fair air movement.  Speaking in full sentences.   Cardiovascular system: RRR no murmurs rubs or gallops.  No JVD.  Bilateral lower extremity edema improving.  Gastrointestinal system: Abdomen is soft, nontender, nondistended, positive bowel sounds.  No rebound.  No guarding.  Central nervous system: Alert and oriented. No focal neurological deficits. Extremities: Bilateral lower extremity lymphedema with decreased weeping, decreased erythema, decreased warmth, decreased tenderness to palpation R > L Skin: Bilateral lower extremity with lymphedema, decreased erythema, decreased warmth, decreased tenderness to palpation.  Bilateral lower extremities wrapped with Kerlix and gauze. Psychiatry: Judgement and insight appear normal. Mood & affect appropriate.     Data Reviewed: I have personally reviewed following labs and imaging studies  CBC: Recent Labs  Lab 04/08/22 1852 04/08/22 1918 04/09/22 0435 04/10/22 0424 04/11/22 0411  WBC  --  10.8* 8.3 8.4 8.9  NEUTROABS  --   --   --  6.0  --   HGB 13.6 12.7 11.7* 11.2* 11.9*  HCT 40.0 40.6 37.1 36.9 39.5  MCV  --  104.6* 104.8* 106.6* 105.9*  PLT  --  253 217 213 217     Basic Metabolic Panel: Recent Labs  Lab 04/08/22 1852 04/08/22 1918 04/08/22 2111 04/09/22 0435 04/10/22 0424 04/11/22 0411  NA 141 138  --  140 135 135  K 3.4* 3.5  --  3.4* 3.5 3.7  CL 96* 96*  --  99 94* 89*  CO2  --  30  --  30 32 33*  GLUCOSE 147* 144*  --  154* 136* 164*  BUN 28* 23  --  23 20 19  $ CREATININE 0.90 0.99  --  0.97 1.04* 0.96  CALCIUM  --  9.0  --  8.5* 8.4* 8.6*  MG  --   --  1.6* 2.1  --   --      GFR: Estimated Creatinine Clearance: 88.6 mL/min (by C-G formula based on SCr of 0.96 mg/dL).  Liver Function Tests: Recent Labs  Lab 04/08/22 1918  AST 23  ALT 17  ALKPHOS 81  BILITOT 1.2  PROT 7.1  ALBUMIN 3.9     CBG: No results for input(s): "GLUCAP" in the last 168 hours.   No results found for this or any previous visit (from the past 240 hour(s)).        Radiology Studies: No results found.      Scheduled Meds:  benazepril  20 mg Oral Daily   And   hydrochlorothiazide  25 mg Oral Daily   vitamin B-12  500 mcg Oral Daily   doxycycline  100 mg Oral Q12H   enoxaparin (LOVENOX) injection  80 mg Subcutaneous Q24H   ipratropium-albuterol  3 mL Nebulization TID   loratadine  10 mg Oral Daily   metoprolol tartrate  50 mg Oral BID   pantoprazole  40 mg Oral Daily   Continuous Infusions:     LOS: 2 days    Time spent: 40 minutes    Irine Seal, MD [facility]   To contact the attending provider between 7A-7P or the covering provider during after hours 7P-7A, please log into the web site [email or web address] and access using universal [place] password for that web site.  If you do not have the password, please call the hospital operator.  04/11/2022, 11:55 AM

## 2022-04-11 NOTE — TOC CM/SW Note (Signed)
Transition of Care [facility]) Screening Note  Patient Details  Name: Allison Short Date of Birth: 1950-03-05  Transition of Care [facility]) CM/SW Contact:    Sherie Don, LCSW Phone Number: 04/11/2022, 9:17 AM  Transition of Care Department [facility]) has reviewed patient and no TOC needs have been identified at this time. We will continue to monitor patient advancement through interdisciplinary progression rounds. If new patient transition needs arise, please place a TOC consult.

## 2022-04-12 ENCOUNTER — Other Ambulatory Visit (HOSPITAL_COMMUNITY): Payer: Self-pay

## 2022-04-12 ENCOUNTER — Other Ambulatory Visit: Payer: Self-pay

## 2022-04-12 DIAGNOSIS — L03115 Cellulitis of right lower limb: Secondary | ICD-10-CM | POA: Diagnosis not present

## 2022-04-12 DIAGNOSIS — E876 Hypokalemia: Secondary | ICD-10-CM | POA: Diagnosis not present

## 2022-04-12 DIAGNOSIS — C8591 Non-Hodgkin lymphoma, unspecified, lymph nodes of head, face, and neck: Secondary | ICD-10-CM | POA: Diagnosis not present

## 2022-04-12 DIAGNOSIS — I10 Essential (primary) hypertension: Secondary | ICD-10-CM | POA: Diagnosis not present

## 2022-04-12 LAB — BASIC METABOLIC PANEL
Anion gap: 10 (ref 5–15)
BUN: 17 mg/dL (ref 8–23)
CO2: 38 mmol/L — ABNORMAL HIGH (ref 22–32)
Calcium: 8.8 mg/dL — ABNORMAL LOW (ref 8.9–10.3)
Chloride: 88 mmol/L — ABNORMAL LOW (ref 98–111)
Creatinine, Ser: 0.8 mg/dL (ref 0.44–1.00)
GFR, Estimated: >60 mL/min (ref >60)
Glucose, Bld: 170 mg/dL — ABNORMAL HIGH (ref 70–99)
Potassium: 3.7 mmol/L (ref 3.5–5.1)
Sodium: 136 mmol/L (ref 135–145)

## 2022-04-12 MED ORDER — PANTOPRAZOLE SODIUM 40 MG PO TBEC
40.0000 mg | DELAYED_RELEASE_TABLET | Freq: Every day | ORAL | 1 refills | Status: DC
  Filled 2022-04-12: qty 30, 30d supply, fill #0

## 2022-04-12 MED ORDER — IPRATROPIUM-ALBUTEROL 0.5-2.5 (3) MG/3ML IN SOLN
3.0000 mL | Freq: Once | RESPIRATORY_TRACT | Status: AC | PRN
  Administered 2022-04-12: 3 mL via RESPIRATORY_TRACT
  Filled 2022-04-12: qty 3

## 2022-04-12 MED ORDER — HYDROXYZINE HCL 25 MG PO TABS
25.0000 mg | ORAL_TABLET | Freq: Three times a day (TID) | ORAL | Status: DC | PRN
  Administered 2022-04-12 – 2022-04-28 (×27): 25 mg via ORAL
  Filled 2022-04-12 (×28): qty 1

## 2022-04-12 MED ORDER — BENAZEPRIL-HYDROCHLOROTHIAZIDE 20-25 MG PO TABS
1.0000 | ORAL_TABLET | Freq: Every day | ORAL | 1 refills | Status: DC
  Filled 2022-04-12: qty 30, 30d supply, fill #0

## 2022-04-12 MED ORDER — LORATADINE 10 MG PO TABS
10.0000 mg | ORAL_TABLET | Freq: Every day | ORAL | 1 refills | Status: DC
  Filled 2022-04-12: qty 30, 30d supply, fill #0

## 2022-04-12 MED ORDER — DOXYCYCLINE HYCLATE 100 MG PO TABS
100.0000 mg | ORAL_TABLET | Freq: Two times a day (BID) | ORAL | 0 refills | Status: DC
  Filled 2022-04-12: qty 8, 4d supply, fill #0

## 2022-04-12 MED ORDER — VITAMIN B-12 1000 MCG PO TABS: 1000.0000 ug | ORAL_TABLET | Freq: Every day | ORAL | Status: AC

## 2022-04-12 MED ORDER — ONDANSETRON HCL 4 MG PO TABS
4.0000 mg | ORAL_TABLET | Freq: Four times a day (QID) | ORAL | 0 refills | Status: DC | PRN
  Filled 2022-04-12: qty 20, 5d supply, fill #0

## 2022-04-12 MED ORDER — HYDROXYZINE HCL 25 MG PO TABS
25.0000 mg | ORAL_TABLET | Freq: Three times a day (TID) | ORAL | 0 refills | Status: DC | PRN
  Filled 2022-04-12: qty 20, 7d supply, fill #0

## 2022-04-12 MED ORDER — IPRATROPIUM-ALBUTEROL 0.5-2.5 (3) MG/3ML IN SOLN
RESPIRATORY_TRACT | 1 refills | Status: DC
  Filled 2022-04-12: qty 180, 16d supply, fill #0
  Filled 2022-04-12: qty 180, 15d supply, fill #0

## 2022-04-12 NOTE — NC FL2 (Signed)
[place] LEVEL OF CARE FORM     IDENTIFICATION  Patient Name: Allison Short Birthdate: June 15, 1950 Sex: female Admission Date (Current Location): 04/08/2022  [facility] and [place] Number:  Herbalist and Address:  [facility],  [place], [place]      Provider Number: [id number]  Attending Physician Name and Address:  Eugenie Filler, MD  Relative Name and Phone Number:       Current Level of Care: Hospital Recommended Level of Care: [place] Prior Approval Number:    Date Approved/Denied:   PASRR Number: [id number]  Discharge Plan: SNF    Current Diagnoses: Patient Active Problem List   Diagnosis Date Noted   Cellulitis of lower extremity with chronic  lymphedema 04/08/2022   Normocytic anemia 04/08/2022   Cellulitis 08/20/2020   Morbid obesity ([place]) 08/20/2020   Hypokalemia 08/20/2020   Cellulitis of right lower extremity 08/20/2020   AKI (acute kidney injury) ([place]) 08/20/2020   Hypertension    Essential hypertension 07/08/2015   Non Hodgkin's lymphoma ([place]) 02/21/2006    Orientation RESPIRATION BLADDER Height & Weight     Self, Time, Situation, Place  Normal Continent Weight: (!) 179.9 kg Height:  5' 6"$  (167.6 cm)  BEHAVIORAL SYMPTOMS/MOOD NEUROLOGICAL BOWEL NUTRITION STATUS      Continent Diet  AMBULATORY STATUS COMMUNICATION OF NEEDS Skin   Extensive Assist Verbally Normal                       Personal Care Assistance Level of Assistance  Bathing, Dressing, Total care Bathing Assistance: Maximum assistance   Dressing Assistance: Limited assistance Total Care Assistance: Maximum assistance   Functional Limitations Info             SPECIAL CARE FACTORS FREQUENCY  PT (By licensed PT), OT (By licensed OT)     PT Frequency: 5x weekly OT Frequency: 5x weekly            Contractures Contractures Info: Not present    Additional Factors Info  Code Status, Allergies  Code Status Info: Full Allergies Info: Ciprofloxin Hcl (Ciprofloxacin), Morphine And Related, Asa (Aspirin), Codeine, Chia Oil, Erythromycin, Nsaids, Penicillins           Current Medications (04/12/2022):  This is the current hospital active medication list Current Facility-Administered Medications  Medication Dose Route Frequency Provider Last Rate Last Admin   acetaminophen (TYLENOL) tablet 650 mg  650 mg Oral Q6H PRN Orma Flaming, MD   650 mg at 04/11/22 1938   Or   acetaminophen (TYLENOL) suppository 650 mg  650 mg Rectal Q6H PRN Orma Flaming, MD       benazepril (LOTENSIN) tablet 20 mg  20 mg Oral Daily Orma Flaming, MD   20 mg at 04/12/22 [id number]   And   hydrochlorothiazide (HYDRODIURIL) tablet 25 mg  25 mg Oral Daily Orma Flaming, MD   25 mg at 04/12/22 [id number]   cyanocobalamin (VITAMIN B12) tablet 500 mcg  500 mcg Oral Daily Orma Flaming, MD   500 mcg at 04/12/22 [id number]   diphenhydrAMINE (BENADRYL) capsule 25 mg  25 mg Oral Q6H PRN Orma Flaming, MD   25 mg at 04/12/22 0243   doxycycline (VIBRA-TABS) tablet 100 mg  100 mg Oral Q12H Eugenie Filler, MD   100 mg at 04/12/22 0904   enoxaparin (LOVENOX) injection 80 mg  80 mg Subcutaneous Q24H Orma Flaming, MD   80 mg at 04/11/22 2132  HYDROcodone-acetaminophen (NORCO/VICODIN) 5-325 MG per tablet 1-2 tablet  1-2 tablet Oral Q4H PRN Orma Flaming, MD   2 tablet at 04/11/22 2131   hydrOXYzine (ATARAX) tablet 25 mg  25 mg Oral TID PRN Eugenie Filler, MD   25 mg at 04/12/22 1106   ipratropium-albuterol (DUONEB) 0.5-2.5 (3) MG/3ML nebulizer solution 3 mL  3 mL Nebulization TID Eugenie Filler, MD   3 mL at 04/12/22 0908   loratadine (CLARITIN) tablet 10 mg  10 mg Oral Daily Eugenie Filler, MD   10 mg at 04/12/22 0905   metoprolol tartrate (LOPRESSOR) tablet 50 mg  50 mg Oral BID Orma Flaming, MD   50 mg at 04/12/22 0904   ondansetron [facility]) tablet 4 mg  4 mg Oral Q6H PRN Orma Flaming, MD   4 mg at 04/12/22 1351    Or   ondansetron (ZOFRAN) injection 4 mg  4 mg Intravenous Q6H PRN Orma Flaming, MD       pantoprazole (PROTONIX) EC tablet 40 mg  40 mg Oral Daily Eugenie Filler, MD   40 mg at 04/12/22 [id number]   sodium chloride (OCEAN) 0.65 % nasal spray 1 spray  1 spray Each Nare PRN Orma Flaming, MD         Discharge Medications: Please see discharge summary for a list of discharge medications.  Relevant Imaging Results:  Relevant Lab Results:   Additional Information SSN [id number]  Joaquin Courts, RN

## 2022-04-12 NOTE — Evaluation (Signed)
Occupational Therapy Evaluation Patient Details Name: Allison Short MRN: [id number] DOB: 1950/04/25 Today's Date: 04/12/2022   History of Present Illness Patient is a 72 year old female who presented  on 04/08/2022 with weeping and draining lymphedema with increased redness and heat. Patient was admitted with cellulitis of lower extremities with chronic lymphedema, hypokalemia, and expiratory wheezing. LE dopplers negative for DVT. PMH including but not limited to: B KA, HTN, Non-hodgkin's lymphoma, lymphedema, and morbid obesity.   Clinical Impression   Patient is a 72 year old female who was admitted for above. Patient was living at home alone at rollator level with with transport services for IADLs. Patient is +2 for bed mobility and transfers with RW. Patient was increasingly anxious during session with education on deep breathing strategies. Patient was able to maintain O2 above 92% on 2L/min with activity with consistent cues for deep breathing strategies. Patient was noted to have decreased functional activity tolerance, decreased endurance, decreased standing balance, decreased safety awareness, and decreased knowledge of AD/AE impacting participation in ADLs. Patient would continue to benefit from skilled OT services at this time while admitted and after d/c to address noted deficits in order to improve overall safety and independence in ADLs.       Recommendations for follow up therapy are one component of a multi-disciplinary discharge planning process, led by the attending physician.  Recommendations may be updated based on patient status, additional functional criteria and insurance authorization.   Follow Up Recommendations  Skilled nursing-short term rehab (<3 hours/day)     Assistance Recommended at Discharge Frequent or constant Supervision/Assistance  Patient can return home with the following Two people to help with walking and/or transfers;Assistance with cooking/housework;Direct  supervision/assist for medications management;Assist for transportation;Help with stairs or ramp for entrance;Direct supervision/assist for financial management;Two people to help with bathing/dressing/bathroom    Functional Status Assessment  Patient has had a recent decline in their functional status and demonstrates the ability to make significant improvements in function in a reasonable and predictable amount of time.  Equipment Recommendations  None recommended by OT       Precautions / Restrictions Precautions Precautions: Fall Restrictions Weight Bearing Restrictions: No      Mobility Bed Mobility Overal bed mobility: Needs Assistance Bed Mobility: Supine to Sit     Supine to sit: +2 for safety/equipment, +2 for physical assistance, Mod assist     General bed mobility comments: with HOB rasied and education for deep breathing. patient noted to hold breath.      Balance Overall balance assessment: Needs assistance Sitting-balance support: Single extremity supported, Feet supported       Standing balance support: Reliant on assistive device for balance, Bilateral upper extremity supported Standing balance-Leahy Scale: Poor         ADL either performed or assessed with clinical judgement   ADL Overall ADL's : Needs assistance/impaired Eating/Feeding: Modified independent;Sitting   Grooming: Wash/dry face;Wash/dry hands;Set up;Sitting   Upper Body Bathing: Minimal assistance;Sitting   Lower Body Bathing: Bed level;Total assistance   Upper Body Dressing : Minimal assistance;Sitting   Lower Body Dressing: Bed level;Total assistance   Toilet Transfer: +2 for safety/equipment;+2 for physical assistance;Moderate assistance;Stand-pivot;Rolling walker (2 wheels) Toilet Transfer Details (indicate cue type and reason): mod A X2 for sit to stand with min ghard with RW once up on feet. Toileting- Clothing Manipulation and Hygiene: +2 for safety/equipment;+2 for  physical assistance;Sit to/from stand  Vision Baseline Vision/History: 1 Wears glasses              Pertinent Vitals/Pain Pain Assessment Pain Assessment: Faces Faces Pain Scale: Hurts even more Pain Location: BLE R>L with movement Pain Descriptors / Indicators: Discomfort, Guarding, Grimacing Pain Intervention(s): Limited activity within patient's tolerance, Monitored during session, Repositioned     Hand Dominance Right   Extremity/Trunk Assessment Upper Extremity Assessment Upper Extremity Assessment: Overall WFL for tasks assessed (within limits of soft tissue mass)   Lower Extremity Assessment Lower Extremity Assessment: Defer to PT evaluation   Cervical / Trunk Assessment Cervical / Trunk Assessment: Other exceptions Cervical / Trunk Exceptions: body habitus   Communication Communication Communication: No difficulties   Cognition Arousal/Alertness: Awake/alert Behavior During Therapy: Anxious Overall Cognitive Status: Within Functional Limits for tasks assessed         General Comments: patient was increasingly anxious with all participation in tasks.                Home Living Family/patient expects to be discharged to:: Private residence Living Arrangements: Alone Available Help at Discharge: Family;Available PRN/intermittently Type of Home: Apartment Home Access: Level entry     Home Layout: One level     Bathroom Shower/Tub: Tub/shower unit         Home Equipment: Rollator (4 wheels);Shower seat          Prior Functioning/Environment Prior Level of Function : Independent/Modified Independent               ADLs Comments: patient reported she does not do LB dressing at home that she wears gowns and nothing on feet.        OT Problem List: Decreased activity tolerance;Impaired balance (sitting and/or standing);Decreased coordination;Decreased knowledge of precautions;Decreased safety awareness      OT  Treatment/Interventions: Self-care/ADL training;Energy conservation;Therapeutic exercise;DME and/or AE instruction;Therapeutic activities;Patient/family education;Balance training    OT Goals(Current goals can be found in the care plan section) Acute Rehab OT Goals Patient Stated Goal: to go home OT Goal Formulation: With patient Time For Goal Achievement: 04/26/22 Potential to Achieve Goals: Fair  OT Frequency: Min 2X/week       AM-PAC OT "6 Clicks" Daily Activity     Outcome Measure Help from another person eating meals?: A Little Help from another person taking care of personal grooming?: A Little Help from another person toileting, which includes using toliet, bedpan, or urinal?: A Lot Help from another person bathing (including washing, rinsing, drying)?: A Lot Help from another person to put on and taking off regular upper body clothing?: A Lot Help from another person to put on and taking off regular lower body clothing?: A Lot 6 Click Score: 14   End of Session Equipment Utilized During Treatment: Gait belt;Rolling walker (2 wheels)  Activity Tolerance: Patient tolerated treatment well Patient left: in chair;with call bell/phone within reach  OT Visit Diagnosis: Unsteadiness on feet (R26.81);Other abnormalities of gait and mobility (R26.89);Muscle weakness (generalized) (M62.81)                Time: [id number] OT Time Calculation (min): 32 min Charges:  OT General Charges $OT Visit: 1 Visit OT Evaluation $OT Eval Moderate Complexity: 1 Mod OT Treatments $Self Care/Home Management : 8-22 mins  Rennie Plowman, MS Acute Rehabilitation Department Office# [phone]   Willa Rough 04/12/2022, 12:28 PM

## 2022-04-12 NOTE — TOC Initial Note (Signed)
Transition of Care [facility]) - Initial/Assessment Note    Patient Details  Name: Allison Short MRN: [id number] Date of Birth: 03-03-1950  Transition of Care (TOC) CM/SW Contact:    Joaquin Courts, RN Phone Number: 04/12/2022, 2:32 PM  Clinical Narrative:                  CM noted therapy recommendations for SNF, discussed with patient who is in agreement.  CM explained snf placement process.  FL2 faxed out to area SNF for bed offers.  TOC is awaiting bed offers.  Expected Discharge Plan: Skilled Nursing Facility Barriers to Discharge: Continued Medical Work up   Patient Goals and CMS Choice Patient states their goals for this hospitalization and ongoing recovery are:: to get rehab and go home CMS Medicare.gov Compare Post Acute Care list provided to:: Patient Choice offered to / list presented to : Patient      Expected Discharge Plan and Services   Discharge Planning Services: CM Consult Post Acute Care Choice: [place] Living arrangements for the past 2 months: Apartment                                      Prior Living Arrangements/Services Living arrangements for the past 2 months: Apartment Lives with:: Self Patient language and need for interpreter reviewed:: Yes Do you feel safe going back to the place where you live?: Yes      Need for Family Participation in Patient Care: Yes (Comment) Care giver support system in place?: Yes (comment)   Criminal Activity/Legal Involvement Pertinent to Current Situation/Hospitalization: No - Comment as needed  Activities of Daily Living Home Assistive Devices/Equipment: None ADL Screening (condition at time of admission) Patient's cognitive ability adequate to safely complete daily activities?: Yes Is the patient deaf or have difficulty hearing?: No Does the patient have difficulty seeing, even when wearing glasses/contacts?: No Does the patient have difficulty concentrating, remembering, or making  decisions?: No Patient able to express need for assistance with ADLs?: Yes Does the patient have difficulty dressing or bathing?: No Independently performs ADLs?: Yes (appropriate for developmental age) Does the patient have difficulty walking or climbing stairs?: No Weakness of Legs: Both Weakness of Arms/Hands: None  Permission Sought/Granted                  Emotional Assessment Appearance:: Appears stated age Attitude/Demeanor/Rapport: Engaged Affect (typically observed): Accepting Orientation: : Oriented to Self, Oriented to Place, Oriented to  Time, Oriented to Situation   Psych Involvement: No (comment)  Admission diagnosis:  Cellulitis of lower extremity [L03.119] Patient Active Problem List   Diagnosis Date Noted   Cellulitis of lower extremity with chronic  lymphedema 04/08/2022   Normocytic anemia 04/08/2022   Cellulitis 08/20/2020   Morbid obesity ([place]) 08/20/2020   Hypokalemia 08/20/2020   Cellulitis of right lower extremity 08/20/2020   AKI (acute kidney injury) ([place]) 08/20/2020   Hypertension    Essential hypertension 07/08/2015   Non Hodgkin's lymphoma ([place]) 02/21/2006   PCP:  Dorothyann Peng, NP Pharmacy:   [facility] [id number], [place] AT [facility] OF [place] [address] Phone: [phone] Fax: [place] [address] Phone: [phone] Fax: [phone]     Social Determinants of Health (SDOH) Social History: SDOH  Screenings   Food Insecurity: No Food Insecurity (04/08/2022)  Housing: Low Risk  (09/13/2021)  Transportation Needs: No Transportation Needs (04/08/2022)  Utilities: Not At Risk (04/08/2022)  Alcohol Screen: Low Risk  (09/13/2021)  Depression (PHQ2-9): Low Risk  (09/13/2021)  Financial Resource Strain: Low Risk  (09/13/2021)  Physical Activity: Inactive (09/13/2021)  Social  Connections: Socially Isolated (09/13/2021)  Stress: No Stress Concern Present (09/13/2021)  Tobacco Use: Low Risk  (04/08/2022)   SDOH Interventions:     Readmission Risk Interventions     No data to display

## 2022-04-12 NOTE — Progress Notes (Signed)
PROGRESS NOTE    Allison Short  [id number] DOB: 05-Nov-1950 DOA: 04/08/2022 PCP: Dorothyann Peng, NP    Chief Complaint  Patient presents with   Leg Swelling    Brief Narrative:  Patient 72 year old female history of hypertension, NHL, lymphedema presented to the ED with complaints of worsening lymphedema, redness which seem to be worsening with some erythema and heat as well as increased weeping and draining.  Patient states ongoing drainage despite using lymphedema pump and compression hose.  Patient unable to wear closed due to excessive drainage.  Patient presented to the ED, lower extremity Dopplers done negative for DVT.  Patient admitted due to concerns for probable lower extremity cellulitis.   Assessment & Plan:   Principal Problem:   Cellulitis of lower extremity with chronic  lymphedema Active Problems:   Hypokalemia   Essential hypertension   Normocytic anemia   Non Hodgkin's lymphoma ([place])   Morbid obesity ([facility])  #1 cellulitis of lower extremity with chronic lymphedema -Patient with history of bilateral lymphedema presented 3-week history of worsening drainage, erythema, pain to right lower extremity with concerns for cellulitis of the right lower extremity. -Patient admitted. -Lower extremity Dopplers negative for DVT. -Patient noted to have received vancomycin and Lasix on presentation to the ED with a urine output of 1.9 L. -Some improvement with weeping from lower extremities. -Afebrile.  Improvement in leukocytosis.   -Patient received doses of Lasix 40 mg IV daily over the past 2 to 3 days with good urine output.   -Lasix held.   -Patient with urine output of 3.850 L over the past 24 hours.   -Patient is -10.9 L during this hospitalization.   -IV had been transition to oral doxycycline to complete a 7 to 10-day course of treatment.   -Patient assessed by PT OT were recommending SNF placement.   -Follow.  2.  Hypokalemia -Repleted.  3.   Hypertension -Continue Lopressor, Lotensin/HCTZ.   4.  Expiratory wheezing -Noted on examination. -Patient denies any prior history of tobacco use. -Patient states has chronic postnasal drip leading to her cough and likely they are wheezing. -Improved wheezing.  -Continue Claritin, Afrin nasal twice daily as needed [place] nasal spray as needed, scheduled DuoNebs. -Status post IV Lasix.  -Hold diuretics.  5.  Normocytic anemia -Patient noted to have low vitamin B12 levels and started on oral vitamin B12 supplementation. -Hemoglobin stable at 11.9. -Repeat labs in the AM.  6.  Non-Hodgkin's lymphoma -Status post chemo/radiation about 10 years ago. -Outpatient follow-up.  7.  Morbid obesity -Lifestyle modification. -Outpatient follow-up with PCP.  8.  Debility -Patient seen by PT/OT, with the evaluation patient felt not to be at baseline, patient lives alone and felt unsafe for patient to be discharged home.  SNF recommended. -Consult TOC for SNF placement.   DVT prophylaxis: Lovenox Code Status: Full Family Communication: Updated patient.  No family at bedside. Disposition: SNF  Status is: Inpatient The patient will require care spanning > 2 midnights and should be moved to inpatient because: Severity of illness   Consultants:  [place], RN Melody Liane Comber 04/11/2022  Procedures: Lower extremity Dopplers 04/08/2022  Antimicrobials:  IV vancomycin 04/08/2022>>>> 04/11/2022 Oral doxycycline 04/11/2022>>>>    Subjective: Patient noted to be anxious overnight leading to some shortness of breath and wheezing which improved with a dose of anxiolytic.  Denies any chest pain.  Feels wheezing has improved.  Feels lower extremity swelling and erythema improving.    Objective: Vitals:   04/12/22 0909 04/12/22 1352  04/12/22 1511 04/12/22 1600  BP:  138/70    Pulse:  79    Resp:  16    Temp:  98.4 F (36.9 C)    TempSrc:  Oral    SpO2: 95% 95% 94% 92%  Weight:      Height:         Intake/Output Summary (Last 24 hours) at 04/12/2022 1730 Last data filed at 04/12/2022 1400 Gross per 24 hour  Intake 1320 ml  Output 4000 ml  Net -2680 ml    Filed Weights   04/08/22 2046  Weight: (!) 179.9 kg    Examination:  General exam: NAD. Respiratory system: Lungs clear to auscultation bilaterally.  No significant wheezing, no crackles, no rhonchi.  Fair air movement.  Speaking in full sentences.   Cardiovascular system: Regular rate rhythm no murmurs rubs or gallops.  No JVD.  Improving bilateral lower extremity edema.  Gastrointestinal system: Abdomen is obese, soft, nontender, nondistended, positive bowel sounds.  No rebound.  No guarding.   Central nervous system: Alert and oriented. No focal neurological deficits. Extremities: Bilateral lower extremity lymphedema with decreased weeping, decreased erythema, decreased warmth, decreased tenderness to palpation R > L Skin: Bilateral lower extremity with lymphedema, decreased erythema, decreased warmth, decreased tenderness to palpation.  Bilateral lower extremities wrapped with Kerlix and gauze. Psychiatry: Judgement and insight appear normal. Mood & affect appropriate.     Data Reviewed: I have personally reviewed following labs and imaging studies  CBC: Recent Labs  Lab 04/08/22 1852 04/08/22 1918 04/09/22 0435 04/10/22 0424 04/11/22 0411  WBC  --  10.8* 8.3 8.4 8.9  NEUTROABS  --   --   --  6.0  --   HGB 13.6 12.7 11.7* 11.2* 11.9*  HCT 40.0 40.6 37.1 36.9 39.5  MCV  --  104.6* 104.8* 106.6* 105.9*  PLT  --  253 217 213 217     Basic Metabolic Panel: Recent Labs  Lab 04/08/22 1918 04/08/22 2111 04/09/22 0435 04/10/22 0424 04/11/22 0411 04/12/22 0952  NA 138  --  140 135 135 136  K 3.5  --  3.4* 3.5 3.7 3.7  CL 96*  --  99 94* 89* 88*  CO2 30  --  30 32 33* 38*  GLUCOSE 144*  --  154* 136* 164* 170*  BUN 23  --  23 20 19 17  $ CREATININE 0.99  --  0.97 1.04* 0.96 0.80  CALCIUM 9.0  --  8.5*  8.4* 8.6* 8.8*  MG  --  1.6* 2.1  --   --   --      GFR: Estimated Creatinine Clearance: 106.3 mL/min (by C-G formula based on SCr of 0.8 mg/dL).  Liver Function Tests: Recent Labs  Lab 04/08/22 1918  AST 23  ALT 17  ALKPHOS 81  BILITOT 1.2  PROT 7.1  ALBUMIN 3.9     CBG: No results for input(s): "GLUCAP" in the last 168 hours.   No results found for this or any previous visit (from the past 240 hour(s)).       Radiology Studies: No results found.      Scheduled Meds:  benazepril  20 mg Oral Daily   And   hydrochlorothiazide  25 mg Oral Daily   vitamin B-12  500 mcg Oral Daily   doxycycline  100 mg Oral Q12H   enoxaparin (LOVENOX) injection  80 mg Subcutaneous Q24H   ipratropium-albuterol  3 mL Nebulization TID   loratadine  10 mg  Oral Daily   metoprolol tartrate  50 mg Oral BID   pantoprazole  40 mg Oral Daily   Continuous Infusions:     LOS: 3 days    Time spent: 40 minutes    Irine Seal, MD [facility]   To contact the attending provider between 7A-7P or the covering provider during after hours 7P-7A, please log into the web site [email or web address] and access using universal [place] password for that web site. If you do not have the password, please call the hospital operator.  04/12/2022, 5:30 PM

## 2022-04-12 NOTE — Progress Notes (Signed)
NP Allison Short was secured chat because patient states she feels like she is having an anxiety attack and feels SOB, O2 sat is 96% on 2L [place], she has wheezing which she had previously. Patient is asking for something to help with anxiety.

## 2022-04-12 NOTE — Evaluation (Addendum)
Physical Therapy Evaluation Patient Details Name: Allison Short MRN: [id number] DOB: [birth year] Today's Date: 04/12/2022  History of Present Illness  Patient is a 72 year old female who presented  on 04/08/2022 with weeping and draining lymphedema with increased redness and heat. Patient was admitted with cellulitis of lower extremities with chronic lymphedema, hypokalemia, and expiratory wheezing. LE dopplers negative for DVT. PMH including but not limited to: B KA, HTN, Non-hodgkin's lymphoma, lymphedema, and morbid obesity.  Clinical Impression  Pt admitted with above diagnosis.  Pt currently with functional limitations due to the deficits listed below (see PT Problem List). Pt seated in recliner when PT arrived. Pt is fearful of falling. PT Eval on RA and pt desaturated to 89% on RA and quickly recovered to 90%, cues for coordinated breathing, encouragement and safety provided t/o intervention. Pt indicates a significant functional decline from baseline in which pt was able to amb up to 30 feet with RW, complete BADLs with the exception of LB dressing and sleeping in recliner. Pt required min A for transfer tasks, cues for proper AD placement, min guard for gait tasks up to 13 feet x 2 and noted B LE MMT deficits R > L impacting gait pattern and balance. Pt reports ongoing B LE pain 4/10 and nursing staff aware.  Pt will benefit from skilled PT to increase their independence and safety with mobility to allow discharge to SNF.    Recommendations for follow up therapy are one component of a multi-disciplinary discharge planning process, led by the attending physician.  Recommendations may be updated based on patient status, additional functional criteria and insurance authorization.  Follow Up Recommendations Skilled nursing-short term rehab (<3 hours/day) Can patient physically be transported by private vehicle: No    Assistance Recommended at Discharge Frequent or constant Supervision/Assistance   Patient can return home with the following  A little help with walking and/or transfers;A lot of help with bathing/dressing/bathroom;Assistance with cooking/housework;Assist for transportation;Help with stairs or ramp for entrance    Equipment Recommendations Other (comment) (pt reports having DME)  Recommendations for Other Services       Functional Status Assessment Patient has had a recent decline in their functional status and demonstrates the ability to make significant improvements in function in a reasonable and predictable amount of time.     Precautions / Restrictions Precautions Precautions: Fall Restrictions Weight Bearing Restrictions: No      Mobility  Bed Mobility                    Transfers Overall transfer level: Needs assistance Equipment used: Rolling walker (2 wheels) Transfers: Sit to/from Stand Sit to Stand: Min assist           General transfer comment: increased time, cues for encouragement and pt is fearful of falling  transfers recliner and commode with elevated commode seat    Ambulation/Gait Ambulation/Gait assistance: Min guard Gait Distance (Feet): 13 Feet (13 feet to commode and 13 feet to return to recliner) Assistive device: Rolling walker (2 wheels) Gait Pattern/deviations: Step-to pattern, Decreased stance time - right, Decreased stride length, Decreased dorsiflexion - right, Shuffle, Wide base of support, Trunk flexed (WB through B UEs) Gait velocity: decreased        Stairs            Wheelchair Mobility    Modified Rankin (Stroke Patients Only)       Balance Overall balance assessment: Needs assistance Sitting-balance support: Feet supported Sitting balance-Leahy Scale: Fair  Standing balance support: Reliant on assistive device for balance Standing balance-Leahy Scale: Poor                               Pertinent Vitals/Pain Pain Assessment Pain Assessment: 0-10 Pain Score: 4   Pain Location: BLE R>L with movement Pain Descriptors / Indicators: Discomfort, Heaviness, Guarding Pain Intervention(s): Limited activity within patient's tolerance, Monitored during session    Home Living Family/patient expects to be discharged to:: Private residence Living Arrangements: Alone Available Help at Discharge: Family;Available PRN/intermittently Type of Home: Apartment Home Access: Level entry       Home Layout: One level Home Equipment: Rollator (4 wheels);Tub bench;BSC/3in1      Prior Function Prior Level of Function : Independent/Modified Independent               ADLs Comments: pt states she is mod I at rollator level for mobility in apartment, sleeps in a recliner, pt orders groceries and is able to complete BADLs with increasing difficulty over the past few wks  pt reports unable to leg lifter to access tub while seated on tub bench prior to admission     Hand Dominance   Dominant Hand: Right    Extremity/Trunk Assessment   Upper Extremity Assessment Upper Extremity Assessment: Overall WFL for tasks assessed (within limits of soft tissue mass)    Lower Extremity Assessment Lower Extremity Assessment: RLE deficits/detail;LLE deficits/detail (Simultaneous filing. User may not have seen previous data.) RLE Deficits / Details: noted gross MMT deficits 2+/5 knee extnesion and hip flexion impacting gait pattern RLE Sensation: decreased light touch LLE Deficits / Details: noted gross MMT deficits 3/5 knee extnesion and hip flexion impacting gait pattern LLE Sensation: decreased light touch    Cervical / Trunk Assessment Cervical / Trunk Assessment: Other exceptions Cervical / Trunk Exceptions: body habitus  Communication   Communication: No difficulties  Cognition Arousal/Alertness: Awake/alert Behavior During Therapy: WFL for tasks assessed/performed Overall Cognitive Status: Within Functional Limits for tasks assessed                                           General Comments General comments (skin integrity, edema, etc.): monitored O2 t/o intervention on RA and pt desaturated to 89% and quickly recovered to 90% with cues for pursed lip breathing and to slow RR    Exercises     Assessment/Plan    PT Assessment Patient needs continued PT services  PT Problem List Decreased strength;Decreased range of motion;Decreased activity tolerance;Decreased balance;Decreased mobility;Decreased knowledge of use of DME;Obesity;Pain;Impaired sensation;Decreased skin integrity       PT Treatment Interventions DME instruction;Gait training;Functional mobility training;Therapeutic activities;Therapeutic exercise;Balance training;Neuromuscular re-education;Patient/family education    PT Goals (Current goals can be found in the Care Plan section)  Acute Rehab PT Goals Patient Stated Goal: to move more easliy and not fall PT Goal Formulation: With patient Time For Goal Achievement: 04/26/22 Potential to Achieve Goals: Good    Frequency Min 2X/week     Co-evaluation               AM-PAC PT "6 Clicks" Mobility  Outcome Measure Help needed turning from your back to your side while in a flat bed without using bedrails?: A Little Help needed moving from lying on your back to sitting on the side of a flat  bed without using bedrails?: A Lot Help needed moving to and from a bed to a chair (including a wheelchair)?: A Lot Help needed standing up from a chair using your arms (e.g., wheelchair or bedside chair)?: A Little Help needed to walk in hospital room?: A Little Help needed climbing 3-5 steps with a railing? : Total 6 Click Score: 14    End of Session Equipment Utilized During Treatment: Gait belt Activity Tolerance: Patient limited by fatigue Patient left: in chair;with call bell/phone within reach Nurse Communication: Mobility status PT Visit Diagnosis: Unsteadiness on feet (R26.81);Other abnormalities of gait and  mobility (R26.89);Muscle weakness (generalized) (M62.81);History of falling (Z91.81);Pain Pain - Right/Left: Left Pain - part of body: Leg;Ankle and joints of foot    Time: [id number] PT Time Calculation (min) (ACUTE ONLY): 51 min   Charges:   PT Evaluation $PT Eval Low Complexity: 1 Low PT Treatments $Gait Training: 8-22 mins $Therapeutic Activity: 8-22 mins        Baird Lyons, PT   Adair Patter 04/12/2022, 12:32 PM

## 2022-04-13 ENCOUNTER — Other Ambulatory Visit (HOSPITAL_COMMUNITY): Payer: Self-pay

## 2022-04-13 ENCOUNTER — Inpatient Hospital Stay (HOSPITAL_COMMUNITY): Payer: Medicare HMO

## 2022-04-13 DIAGNOSIS — I890 Lymphedema, not elsewhere classified: Secondary | ICD-10-CM | POA: Diagnosis not present

## 2022-04-13 LAB — BASIC METABOLIC PANEL
Anion gap: 11 (ref 5–15)
BUN: 22 mg/dL (ref 8–23)
CO2: 35 mmol/L — ABNORMAL HIGH (ref 22–32)
Calcium: 8.7 mg/dL — ABNORMAL LOW (ref 8.9–10.3)
Chloride: 90 mmol/L — ABNORMAL LOW (ref 98–111)
Creatinine, Ser: 1.29 mg/dL — ABNORMAL HIGH (ref 0.44–1.00)
GFR, Estimated: 44 mL/min — ABNORMAL LOW (ref >60)
Glucose, Bld: 188 mg/dL — ABNORMAL HIGH (ref 70–99)
Potassium: 4.4 mmol/L (ref 3.5–5.1)
Sodium: 136 mmol/L (ref 135–145)

## 2022-04-13 LAB — CBC WITH DIFFERENTIAL/PLATELET
Abs Immature Granulocytes: 0.03 10*3/uL (ref 0.00–0.07)
Basophils Absolute: 0 10*3/uL (ref 0.0–0.1)
Basophils Relative: 0 %
Eosinophils Absolute: 0.2 10*3/uL (ref 0.0–0.5)
Eosinophils Relative: 2 %
HCT: 41.1 % (ref 36.0–46.0)
Hemoglobin: 12.4 g/dL (ref 12.0–15.0)
Immature Granulocytes: 0 %
Lymphocytes Relative: 8 %
Lymphs Abs: 0.8 10*3/uL (ref 0.7–4.0)
MCH: 32.1 pg (ref 26.0–34.0)
MCHC: 30.2 g/dL (ref 30.0–36.0)
MCV: 106.5 fL — ABNORMAL HIGH (ref 80.0–100.0)
Monocytes Absolute: 0.7 10*3/uL (ref 0.1–1.0)
Monocytes Relative: 8 %
Neutro Abs: 7.7 10*3/uL (ref 1.7–7.7)
Neutrophils Relative %: 82 %
Platelets: 251 10*3/uL (ref 150–400)
RBC: 3.86 MIL/uL — ABNORMAL LOW (ref 3.87–5.11)
RDW: 14.9 % (ref 11.5–15.5)
WBC: 9.4 10*3/uL (ref 4.0–10.5)
nRBC: 0 % (ref 0.0–0.2)

## 2022-04-13 LAB — BLOOD GAS, ARTERIAL
Acid-Base Excess: 16.4 mmol/L — ABNORMAL HIGH (ref 0.0–2.0)
Bicarbonate: 45.2 mmol/L — ABNORMAL HIGH (ref 20.0–28.0)
Drawn by: 20012
FIO2: 32 %
O2 Content: 32 L/min
O2 Saturation: 99.7 %
Patient temperature: 37
pCO2 arterial: 80 mmHg (ref 32–48)
pH, Arterial: 7.36 (ref 7.35–7.45)
pO2, Arterial: 102 mmHg (ref 83–108)

## 2022-04-13 LAB — BLOOD GAS, VENOUS
Acid-Base Excess: 17.6 mmol/L — ABNORMAL HIGH (ref 0.0–2.0)
Bicarbonate: 47.4 mmol/L — ABNORMAL HIGH (ref 20.0–28.0)
O2 Saturation: 57.6 %
Patient temperature: 36.9
pCO2, Ven: 82 mmHg (ref 44–60)
pH, Ven: 7.37 (ref 7.25–7.43)
pO2, Ven: 35 mmHg (ref 32–45)

## 2022-04-13 LAB — MRSA NEXT GEN BY PCR, NASAL: MRSA by PCR Next Gen: NOT DETECTED

## 2022-04-13 LAB — MAGNESIUM: Magnesium: 1.5 mg/dL — ABNORMAL LOW (ref 1.7–2.4)

## 2022-04-13 MED ORDER — ORAL CARE MOUTH RINSE
15.0000 mL | OROMUCOSAL | Status: DC
  Administered 2022-04-13 – 2022-04-15 (×6): 15 mL via OROMUCOSAL

## 2022-04-13 MED ORDER — ORAL CARE MOUTH RINSE: 15.0000 mL | OROMUCOSAL | Status: DC | PRN

## 2022-04-13 MED ORDER — SODIUM CHLORIDE 0.9 % IV SOLN
2.0000 g | Freq: Three times a day (TID) | INTRAVENOUS | Status: DC
  Administered 2022-04-13 – 2022-04-14 (×3): 2 g via INTRAVENOUS
  Filled 2022-04-13 (×4): qty 12.5

## 2022-04-13 MED ORDER — HALOPERIDOL LACTATE 5 MG/ML IJ SOLN
2.5000 mg | Freq: Once | INTRAMUSCULAR | Status: AC
  Administered 2022-04-13: 2.5 mg via INTRAVENOUS
  Filled 2022-04-13: qty 1

## 2022-04-13 MED ORDER — CHLORHEXIDINE GLUCONATE CLOTH 2 % EX PADS
6.0000 | MEDICATED_PAD | Freq: Every day | CUTANEOUS | Status: DC
  Administered 2022-04-13 – 2022-04-16 (×4): 6 via TOPICAL

## 2022-04-13 NOTE — Progress Notes (Signed)
Critical lab result CO2-82, and reported to Raenette Rover, NP.

## 2022-04-13 NOTE — Progress Notes (Signed)
Blood gas results reviewed . She is Alert to person/place/month and year at this time. Educated on lab results and the need to wear the bipap for a while , and she says, "No!"  She remains on 3 L [place] at this time and Sats maintaining at 91%. [place], NP and provided with updates and on refusal of bipap. Safety sitter is at bedside .

## 2022-04-13 NOTE — Progress Notes (Signed)
PROGRESS NOTE    Allison Short  [id number] DOB: February 24, 1950 DOA: 04/08/2022 PCP: Dorothyann Peng, NP   Brief Narrative:  72 year old female history of hypertension, NHL, lymphedema presented to the ED with complaints of worsening lymphedema, redness which seem to be worsening with some erythema and heat as well as increased weeping and draining.  Patient states ongoing drainage despite using lymphedema pump and compression hose.  Patient unable to wear closed due to excessive drainage.  Patient presented to the ED, lower extremity Dopplers done negative for DVT.  Patient admitted due to concerns for probable lower extremity cellulitis.    Assessment & Plan:   Principal Problem:   Cellulitis of lower extremity with chronic  lymphedema Active Problems:   Hypokalemia   Essential hypertension   Normocytic anemia   Non Hodgkin's lymphoma ([place])   Morbid obesity (HCC)   #1 right lower extremity cellulitis in the setting of chronic bilateral lymphedema-patient admitted with erythema edema and drainage from the right lower extremity.  Dopplers of the lower extremity negative for DVT.  Initially treated with vancomycin then changed to doxycycline.  Seen by PT recommending SNF. Patient was also receiving as needed Lasix.  #2 AKI creatinine increased to 1.29 from 0.80.  Will hold ACE inhibitor and hydrochlorothiazide. Currently Lasix on hold.  #3 hypomagnesemia replete and recheck labs in AM.   #4 history of hypertension now with soft BP on Lopressor Lotensin HCTZ will hold  #5 history of non-Hodgkin's lymphoma outpatient follow-up  #6 history of chronic normocytic anemia stable  #7 morbid obesity close outpatient follow-up with PCP and lifestyle modification  #8 debility seen by PT and OT recommending skilled nursing facility placement   Estimated body mass index is 72.54 kg/m as calculated from the following:   Height as of this encounter: 5' 2"$  (1.575 m).   Weight as of this  encounter: 179.9 kg.  DVT prophylaxis: lovenox Code Status: full Family Communication:none  Disposition Plan:  Status is: Inpatient Remains inpatient appropriate because: snf placement    Consultants: none  Procedures: none Antimicrobials: doxy  Subjective:  Wheezing  No other c/o Objective: Vitals:   04/12/22 2138 04/12/22 2151 04/13/22 0452 04/13/22 0920  BP: (!) 158/79  100/63   Pulse: 98  89   Resp: 18  20   Temp: 99.4 F (37.4 C)  99 F (37.2 C)   TempSrc: Oral  Oral   SpO2: 93% 94% 97% 97%  Weight:      Height:        Intake/Output Summary (Last 24 hours) at 04/13/2022 1135 Last data filed at 04/13/2022 0900 Gross per 24 hour  Intake 1020 ml  Output 1650 ml  Net -630 ml   Filed Weights   04/08/22 2046  Weight: (!) 179.9 kg    Examination:  General exam: Appears chronically ill appearing  Respiratory system: wheezing to auscultation. Respiratory effort normal. Cardiovascular system: S1 & S2 heard, RRR. No JVD, murmurs, rubs, gallops or clicks. No pedal edema. Gastrointestinal system: Abdomen is nondistended, soft and nontender. No organomegaly or masses felt. Normal bowel sounds heard. Central nervous system: Alert and oriented. No focal neurological deficits. Extremities: right thigh inner erythema  Psychiatry: Judgement and insight appear normal. Mood & affect appropriate.     Data Reviewed: I have personally reviewed following labs and imaging studies  CBC: Recent Labs  Lab 04/08/22 1918 04/09/22 0435 04/10/22 0424 04/11/22 0411 04/13/22 0428  WBC 10.8* 8.3 8.4 8.9 9.4  NEUTROABS  --   --  6.0  --  7.7  HGB 12.7 11.7* 11.2* 11.9* 12.4  HCT 40.6 37.1 36.9 39.5 41.1  MCV 104.6* 104.8* 106.6* 105.9* 106.5*  PLT 253 217 213 217 [id number]   Basic Metabolic Panel: Recent Labs  Lab 04/08/22 2111 04/09/22 0435 04/10/22 0424 04/11/22 0411 04/12/22 0952 04/13/22 0428  NA  --  140 135 135 136 136  K  --  3.4* 3.5 3.7 3.7 4.4  CL  --  99 94* 89*  88* 90*  CO2  --  30 32 33* 38* 35*  GLUCOSE  --  154* 136* 164* 170* 188*  BUN  --  23 20 19 17 22  $ CREATININE  --  0.97 1.04* 0.96 0.80 1.29*  CALCIUM  --  8.5* 8.4* 8.6* 8.8* 8.7*  MG 1.6* 2.1  --   --   --  1.5*   GFR: Estimated Creatinine Clearance: 62.5 mL/min (A) (by C-G formula based on SCr of 1.29 mg/dL (H)). Liver Function Tests: Recent Labs  Lab 04/08/22 1918  AST 23  ALT 17  ALKPHOS 81  BILITOT 1.2  PROT 7.1  ALBUMIN 3.9   No results for input(s): "LIPASE", "AMYLASE" in the last 168 hours. No results for input(s): "AMMONIA" in the last 168 hours. Coagulation Profile: No results for input(s): "INR", "PROTIME" in the last 168 hours. Cardiac Enzymes: No results for input(s): "CKTOTAL", "CKMB", "CKMBINDEX", "TROPONINI" in the last 168 hours. BNP (last 3 results) No results for input(s): "PROBNP" in the last 8760 hours. HbA1C: No results for input(s): "HGBA1C" in the last 72 hours. CBG: No results for input(s): "GLUCAP" in the last 168 hours. Lipid Profile: No results for input(s): "CHOL", "HDL", "LDLCALC", "TRIG", "CHOLHDL", "LDLDIRECT" in the last 72 hours. Thyroid Function Tests: No results for input(s): "TSH", "T4TOTAL", "FREET4", "T3FREE", "THYROIDAB" in the last 72 hours. Anemia Panel: No results for input(s): "VITAMINB12", "FOLATE", "FERRITIN", "TIBC", "IRON", "RETICCTPCT" in the last 72 hours. Sepsis Labs: No results for input(s): "PROCALCITON", "LATICACIDVEN" in the last 168 hours.  No results found for this or any previous visit (from the past 240 hour(s)).       Radiology Studies: No results found.      Scheduled Meds:  vitamin B-12  500 mcg Oral Daily   doxycycline  100 mg Oral Q12H   enoxaparin (LOVENOX) injection  80 mg Subcutaneous Q24H   hydrochlorothiazide  25 mg Oral Daily   ipratropium-albuterol  3 mL Nebulization TID   loratadine  10 mg Oral Daily   metoprolol tartrate  50 mg Oral BID   pantoprazole  40 mg Oral Daily    Continuous Infusions:   LOS: 4 days    Time spent: 39 min  Georgette Shell, MD 04/13/2022, 11:35 AM

## 2022-04-13 NOTE — Progress Notes (Signed)
Patient's son, Hilliard Clark, notified of patient's transfer and current room number. All questions answered.

## 2022-04-13 NOTE — Progress Notes (Addendum)
1520 Critical value received from lab PCO2 80. Rodena Piety MD paged and secure messaged. Stat order for BIPAP. Resp and Rapid called for higher level of care. Provider gave verbal for transfer to stepdown ICU and order submitted.

## 2022-04-13 NOTE — Progress Notes (Signed)
   04/13/22 1613  BiPAP/CPAP/SIPAP  $ Non-Invasive Ventilator  Non-Invasive Vent Set Up;Non-Invasive Vent Initial  $ Face Mask Large  Yes  BiPAP/CPAP/SIPAP Pt Type Adult  Mask Type Full face mask  Mask Size Large  Set Rate (S)  18 breaths/min  Respiratory Rate 27 breaths/min  IPAP (S)  18 cmH20  EPAP (S)  8 cmH2O  FiO2 (%) (S)  32 %  Minute Ventilation 18  Leak 26  Peak Inspiratory Pressure (PIP) 17  Tidal Volume (Vt) 677  BiPAP/CPAP/SIPAP BiPAP  Auto Titrate Yes  Press High Alarm 40 cmH2O  Press Low Alarm 5 cmH2O  Nasal massage performed No (comment)  CPAP/SIPAP surface wiped down Yes  BiPAP/CPAP /SiPAP Vitals  Resp (!) 27  Bilateral Breath Sounds Diminished  MEWS Score/Color  MEWS Score 2  MEWS Score Color Yellow

## 2022-04-13 NOTE — Progress Notes (Signed)
Allison Rover, NP here to assess her. Intermittent confusion continues but she is alert to place at this time. Education provided further about the need for the bipap. O2 sats do decrease to 70-80s at times w/out it. She is lethargic falling asleep during mid sentence and conversation.  After walking out of the room , she tore the mask off her face and threw it across the room on the floor. She stated, "You're trying to kill me."  Safety sitter ordered  and Blood gas. She is now on 3L [place] b/c she refuses the bipap.

## 2022-04-13 NOTE — TOC Progression Note (Signed)
Transition of Care [facility]) - Progression Note   Patient Details  Name: Allison Short MRN: [id number] Date of Birth: 10-17-1950  Transition of [facility]) CM/SW [place], LCSW Phone Number: 04/13/2022, 11:53 AM  Clinical Narrative: CSW made calls to the [place] lymphedema clinic and the [facility] clinic on 04/12/22, but the [place] location only accepts cancer patients with lymphedema and the [facility] clinic has a waitlist of several months. CSW attempted to call the [facility] lymphedema clinic, but was unable to reach anyone so CSW left voicemail requesting call back.  CSW met with patient to review bed offers and patient selected [facility]. Patient is expected to be medically ready tomorrow per hospitalist. CSW confirmed bed for tomorrow with Lorenza Chick in admissions. CSW completed insurance authorization. Reference ID # is: [id number]. Patient has been approved for 04/14/2022-04/18/2022.  Expected Discharge Plan: [place] Barriers to Discharge: Continued Medical Work up  Expected Discharge Plan and Services Discharge Planning Services: CM Consult Post Acute Care Choice: [place] Living arrangements for the past 2 months: Apartment  Social Determinants of Health (SDOH) Interventions SDOH Screenings   Food Insecurity: No Food Insecurity (04/08/2022)  Housing: Low Risk  (09/13/2021)  Transportation Needs: No Transportation Needs (04/08/2022)  Utilities: Not At Risk (04/08/2022)  Alcohol Screen: Low Risk  (09/13/2021)  Depression (PHQ2-9): Low Risk  (09/13/2021)  Financial Resource Strain: Low Risk  (09/13/2021)  Physical Activity: Inactive (09/13/2021)  Social Connections: Socially Isolated (09/13/2021)  Stress: No Stress Concern Present (09/13/2021)  Tobacco Use: Low Risk  (04/08/2022)   Readmission Risk Interventions     No data to display

## 2022-04-13 NOTE — Progress Notes (Addendum)
Refusing bipap mask. RT at bedside. Education provided to her as to why she needs it. She is having anxiety with wearing the mask on her face. Confused to place, time, year. Intermittenly screaming out and removing the mask after placing it back on her. Will notifiy on call, Raenette Rover, NP of her anxiousness and non compliance.

## 2022-04-13 NOTE — Progress Notes (Signed)
Patient ripped off bipap mask, won't allow RN to place back on. RT and MD notified. Patient placed on 3L [place] for now. Vital signs stable.

## 2022-04-14 ENCOUNTER — Inpatient Hospital Stay (HOSPITAL_COMMUNITY): Payer: Medicare HMO

## 2022-04-14 DIAGNOSIS — I890 Lymphedema, not elsewhere classified: Secondary | ICD-10-CM | POA: Diagnosis not present

## 2022-04-14 LAB — TROPONIN I (HIGH SENSITIVITY): Troponin I (High Sensitivity): 108 ng/L (ref <18)

## 2022-04-14 LAB — BASIC METABOLIC PANEL
Anion gap: 12 (ref 5–15)
BUN: 34 mg/dL — ABNORMAL HIGH (ref 8–23)
CO2: 34 mmol/L — ABNORMAL HIGH (ref 22–32)
Calcium: 8.6 mg/dL — ABNORMAL LOW (ref 8.9–10.3)
Chloride: 89 mmol/L — ABNORMAL LOW (ref 98–111)
Creatinine, Ser: 1.36 mg/dL — ABNORMAL HIGH (ref 0.44–1.00)
GFR, Estimated: 41 mL/min — ABNORMAL LOW (ref >60)
Glucose, Bld: 126 mg/dL — ABNORMAL HIGH (ref 70–99)
Potassium: 3.7 mmol/L (ref 3.5–5.1)
Sodium: 135 mmol/L (ref 135–145)

## 2022-04-14 LAB — URINALYSIS, ROUTINE W REFLEX MICROSCOPIC
Bilirubin Urine: NEGATIVE
Glucose, UA: NEGATIVE mg/dL
Hgb urine dipstick: NEGATIVE
Ketones, ur: NEGATIVE mg/dL
Leukocytes,Ua: NEGATIVE
Nitrite: NEGATIVE
Protein, ur: NEGATIVE mg/dL
Specific Gravity, Urine: 1.01 (ref 1.005–1.030)
pH: 5 (ref 5.0–8.0)

## 2022-04-14 LAB — CBC
HCT: 37.6 % (ref 36.0–46.0)
Hemoglobin: 11.7 g/dL — ABNORMAL LOW (ref 12.0–15.0)
MCH: 32.7 pg (ref 26.0–34.0)
MCHC: 31.1 g/dL (ref 30.0–36.0)
MCV: 105 fL — ABNORMAL HIGH (ref 80.0–100.0)
Platelets: 229 10*3/uL (ref 150–400)
RBC: 3.58 MIL/uL — ABNORMAL LOW (ref 3.87–5.11)
RDW: 14.7 % (ref 11.5–15.5)
WBC: 7.2 10*3/uL (ref 4.0–10.5)
nRBC: 0 % (ref 0.0–0.2)

## 2022-04-14 LAB — BLOOD GAS, VENOUS
Acid-Base Excess: 13.2 mmol/L — ABNORMAL HIGH (ref 0.0–2.0)
Bicarbonate: 42.5 mmol/L — ABNORMAL HIGH (ref 20.0–28.0)
Drawn by: 355291
O2 Saturation: 96.3 %
Patient temperature: 36.6
pCO2, Ven: 76 mmHg (ref 44–60)
pH, Ven: 7.36 (ref 7.25–7.43)
pO2, Ven: 72 mmHg — ABNORMAL HIGH (ref 32–45)

## 2022-04-14 LAB — MAGNESIUM: Magnesium: 1.8 mg/dL (ref 1.7–2.4)

## 2022-04-14 MED ORDER — IPRATROPIUM-ALBUTEROL 0.5-2.5 (3) MG/3ML IN SOLN
3.0000 mL | Freq: Four times a day (QID) | RESPIRATORY_TRACT | Status: DC | PRN
  Administered 2022-04-19 – 2022-04-24 (×2): 3 mL via RESPIRATORY_TRACT
  Filled 2022-04-14 (×3): qty 3

## 2022-04-14 MED ORDER — HYDROCORTISONE 1 % EX CREA
1.0000 | TOPICAL_CREAM | Freq: Three times a day (TID) | CUTANEOUS | Status: DC | PRN
  Administered 2022-04-14 – 2022-04-29 (×5): 1 via TOPICAL
  Filled 2022-04-14 (×3): qty 28

## 2022-04-14 MED ORDER — IPRATROPIUM-ALBUTEROL 0.5-2.5 (3) MG/3ML IN SOLN: 3.0000 mL | Freq: Two times a day (BID) | RESPIRATORY_TRACT | Status: DC

## 2022-04-14 MED ORDER — POTASSIUM CHLORIDE CRYS ER 20 MEQ PO TBCR
40.0000 meq | EXTENDED_RELEASE_TABLET | Freq: Once | ORAL | Status: AC
  Administered 2022-04-14: 40 meq via ORAL
  Filled 2022-04-14: qty 2

## 2022-04-14 MED ORDER — MELATONIN 3 MG PO TABS
6.0000 mg | ORAL_TABLET | Freq: Every evening | ORAL | Status: AC | PRN
  Administered 2022-04-14 – 2022-04-17 (×3): 6 mg via ORAL
  Filled 2022-04-14 (×3): qty 2

## 2022-04-14 MED ORDER — FUROSEMIDE 10 MG/ML IJ SOLN
20.0000 mg | Freq: Four times a day (QID) | INTRAMUSCULAR | Status: AC
  Administered 2022-04-14 (×3): 20 mg via INTRAVENOUS
  Filled 2022-04-14 (×3): qty 2

## 2022-04-14 MED ORDER — SODIUM CHLORIDE 0.9 % IV SOLN
2.0000 g | Freq: Two times a day (BID) | INTRAVENOUS | Status: DC
  Administered 2022-04-14 – 2022-04-18 (×8): 2 g via INTRAVENOUS
  Filled 2022-04-14 (×8): qty 12.5

## 2022-04-14 NOTE — Progress Notes (Signed)
Removed all monitoring devices and threw on floor. Removed IV in RFA . States, "The police are coming to get me, and I am in some fake hospital."  Attempts to re orient unsuccessful. VS stable at this time. Continuing to monitor.

## 2022-04-14 NOTE — Progress Notes (Signed)
PHARMACY NOTE:  ANTIMICROBIAL RENAL DOSAGE ADJUSTMENT  Current antimicrobial regimen includes a mismatch between antimicrobial dosage and estimated renal function.  As per policy approved by the Pharmacy & Therapeutics and Medical Executive Committees, the antimicrobial dosage will be adjusted accordingly.  Current antimicrobial dosage:  cefepime 2 g IV q8hr  Indication: pneumonia  Renal Function:  Estimated Creatinine Clearance: 56.5 mL/min (A) (by C-G formula based on SCr of 1.36 mg/dL (H)).     Antimicrobial dosage has been changed to:  cefepime 2 g IV q12h    Thank you for allowing pharmacy to be a part of this patient's care.   Tawnya Crook, PharmD, BCPS Clinical Pharmacist 04/14/2022 11:15 AM

## 2022-04-14 NOTE — Progress Notes (Signed)
PROGRESS NOTE    Allison Short  [id number] DOB: [birth year] DOA: 04/08/2022 PCP: Dorothyann Peng, NP   Brief Narrative:  72 year old female history of hypertension, NHL, lymphedema presented to the ED with complaints of worsening lymphedema, redness which seem to be worsening with some erythema and heat as well as increased weeping and draining.  Patient states ongoing drainage despite using lymphedema pump and compression hose.  Patient unable to wear closed due to excessive drainage.  Patient presented to the ED, lower extremity Dopplers done negative for DVT.  Patient admitted due to concerns for probable lower extremity cellulitis.    Assessment & Plan:   Principal Problem:   Cellulitis of lower extremity with chronic  lymphedema Active Problems:   Hypokalemia   Essential hypertension   Normocytic anemia   Non Hodgkin's lymphoma ([place])   Morbid obesity (HCC)   Lymphedema   #1 right lower extremity cellulitis in the setting of chronic bilateral lymphedema-patient admitted with erythema edema and drainage from the right lower extremity.  Dopplers of the lower extremity negative for DVT.  Initially treated with vancomycin then changed to doxycycline.  Seen by PT recommending SNF. Patient was also receiving as needed Lasix.  #2 AKI creatinine increased to 1.36 from 1.29 from 0.80.  Continue to hold ACE inhibitor and hydrochlorothiazide. Will give her few doses of Lasix today with chest x-ray findings of pulmonary edema and increased oxygen demand.  #3 hypomagnesemia repleted.   #4 history of hypertension -Lasix and Lopressor #5 history of non-Hodgkin's lymphoma outpatient follow-up  #6 history of chronic normocytic anemia stable  #7 morbid obesity close outpatient follow-up with PCP and lifestyle modification  #8 debility seen by PT and OT recommending skilled nursing facility placement   Estimated body mass index is 67.58 kg/m as calculated from the following:   Height as of  this encounter: 5' 2"$  (1.575 m).   Weight as of this encounter: 167.6 kg.  DVT prophylaxis: lovenox Code Status: full Family Communication:none  Disposition Plan:  Status is: Inpatient Remains inpatient appropriate because: snf placement    Consultants: none  Procedures: none Antimicrobials: doxy  Subjective:  She had a rough night she was placed on BiPAP which she ripped off after a while   objective: Vitals:   04/14/22 0312 04/14/22 0700 04/14/22 0912 04/14/22 1200  BP: (!) 153/58  (!) 151/82   Pulse: 68 73 80 71  Resp: (!) 23 18 20 $ (!) 23  Temp: 97.9 F (36.6 C)     TempSrc: Axillary     SpO2: 96% 99% 91% 98%  Weight:      Height:        Intake/Output Summary (Last 24 hours) at 04/14/2022 1337 Last data filed at 04/14/2022 1232 Gross per 24 hour  Intake 539.96 ml  Output 1750 ml  Net -1210.04 ml    Filed Weights   04/08/22 2046 04/13/22 1635  Weight: (!) 179.9 kg (!) 167.6 kg    Examination:  General exam: Appears chronically ill appearing  Respiratory system: wheezing to auscultation. Respiratory effort normal. Cardiovascular system: S1 & S2 heard, RRR. No JVD, murmurs, rubs, gallops or clicks. No pedal edema. Gastrointestinal system: Abdomen is nondistended, soft and nontender. No organomegaly or masses felt. Normal bowel sounds heard. Central nervous system: Alert and oriented. No focal neurological deficits. Extremities: right thigh inner erythema  Psychiatry: Judgement and insight appear normal. Mood & affect appropriate.     Data Reviewed: I have personally reviewed following labs and imaging studies  CBC: Recent Labs  Lab 04/09/22 0435 04/10/22 0424 04/11/22 0411 04/13/22 0428 04/14/22 0323  WBC 8.3 8.4 8.9 9.4 7.2  NEUTROABS  --  6.0  --  7.7  --   HGB 11.7* 11.2* 11.9* 12.4 11.7*  HCT 37.1 36.9 39.5 41.1 37.6  MCV 104.8* 106.6* 105.9* 106.5* 105.0*  PLT 217 213 217 251 [id number]    Basic Metabolic Panel: Recent Labs  Lab 04/08/22 2111  04/09/22 0435 04/10/22 0424 04/11/22 0411 04/12/22 0952 04/13/22 0428 04/14/22 0323  NA  --  140 135 135 136 136 135  K  --  3.4* 3.5 3.7 3.7 4.4 3.7  CL  --  99 94* 89* 88* 90* 89*  CO2  --  30 32 33* 38* 35* 34*  GLUCOSE  --  154* 136* 164* 170* 188* 126*  BUN  --  23 20 19 17 22 $ 34*  CREATININE  --  0.97 1.04* 0.96 0.80 1.29* 1.36*  CALCIUM  --  8.5* 8.4* 8.6* 8.8* 8.7* 8.6*  MG 1.6* 2.1  --   --   --  1.5* 1.8    GFR: Estimated Creatinine Clearance: 56.5 mL/min (A) (by C-G formula based on SCr of 1.36 mg/dL (H)). Liver Function Tests: Recent Labs  Lab 04/08/22 1918  AST 23  ALT 17  ALKPHOS 81  BILITOT 1.2  PROT 7.1  ALBUMIN 3.9    No results for input(s): "LIPASE", "AMYLASE" in the last 168 hours. No results for input(s): "AMMONIA" in the last 168 hours. Coagulation Profile: No results for input(s): "INR", "PROTIME" in the last 168 hours. Cardiac Enzymes: No results for input(s): "CKTOTAL", "CKMB", "CKMBINDEX", "TROPONINI" in the last 168 hours. BNP (last 3 results) No results for input(s): "PROBNP" in the last 8760 hours. HbA1C: No results for input(s): "HGBA1C" in the last 72 hours. CBG: No results for input(s): "GLUCAP" in the last 168 hours. Lipid Profile: No results for input(s): "CHOL", "HDL", "LDLCALC", "TRIG", "CHOLHDL", "LDLDIRECT" in the last 72 hours. Thyroid Function Tests: No results for input(s): "TSH", "T4TOTAL", "FREET4", "T3FREE", "THYROIDAB" in the last 72 hours. Anemia Panel: No results for input(s): "VITAMINB12", "FOLATE", "FERRITIN", "TIBC", "IRON", "RETICCTPCT" in the last 72 hours. Sepsis Labs: No results for input(s): "PROCALCITON", "LATICACIDVEN" in the last 168 hours.  Recent Results (from the past 240 hour(s))  MRSA Next Gen by PCR, Nasal     Status: None   Collection Time: 04/13/22  3:36 PM   Specimen: Nasal Mucosa; Nasal Swab  Result Value Ref Range Status   MRSA by PCR Next Gen NOT DETECTED NOT DETECTED Final    Comment:  (NOTE) The GeneXpert MRSA Assay (FDA approved for NASAL specimens only), is one component of a comprehensive MRSA colonization surveillance program. It is not intended to diagnose MRSA infection nor to guide or monitor treatment for MRSA infections. Test performance is not FDA approved in patients less than 49 years old. Performed at [facility], [place] [address], [place], [place] [address]          Radiology Studies: [facility] Chest Port 1 View  Result Date: 04/14/2022 CLINICAL DATA:  [id number] Respiratory complication [id number] EXAM: PORTABLE CHEST 1 VIEW COMPARISON:  Chest x-ray 04/13/2022, chest x-ray 08/20/2020, CT angio chest 08/21/2020 FINDINGS: The heart and mediastinal contours are unchanged. Prominent hilar vasculature. Improved aeration of the left lower lobe. No focal consolidation. Mild pulmonary edema. No pleural effusion. No pneumothorax. No acute osseous abnormality. IMPRESSION: Pulmonary venous congestion with likely developing mild pulmonary edema.  Electronically Signed   By: Iven Finn M.D.   On: 04/14/2022 03:35   DG Chest 1 View  Result Date: 04/13/2022 CLINICAL DATA:  Shortness of breath EXAM: CHEST  1 VIEW COMPARISON:  Portable exam 1039 hours compared to 08/20/2020 FINDINGS: Enlargement of cardiac silhouette with pulmonary vascular congestion. Mediastinal contour stable for technique. Mild LEFT basilar infiltrate. Remaining lungs clear. No pleural effusion or pneumothorax. BILATERAL glenohumeral degenerative changes. IMPRESSION: Mild LEFT basilar infiltrate favoring pneumonia. Electronically Signed   By: Lavonia Dana M.D.   On: 04/13/2022 13:23        Scheduled Meds:  Chlorhexidine Gluconate Cloth  6 each Topical Daily   vitamin B-12  500 mcg Oral Daily   doxycycline  100 mg Oral Q12H   enoxaparin (LOVENOX) injection  80 mg Subcutaneous Q24H   furosemide  20 mg Intravenous Q6H   ipratropium-albuterol  3 mL Nebulization BID   loratadine  10 mg Oral  Daily   mouth rinse  15 mL Mouth Rinse 4 times per day   pantoprazole  40 mg Oral Daily   Continuous Infusions:  ceFEPime (MAXIPIME) IV       LOS: 5 days    Time spent: 39 min  Georgette Shell, MD 04/14/2022, 1:37 PM

## 2022-04-14 NOTE — TOC Progression Note (Addendum)
Transition of Care [facility]) - Progression Note    Patient Details  Name: Allison Short MRN: [id number] Date of Birth: Jul 17, 1950  Transition of Care [facility]) CM/SW [place], RN Phone Number: 04/14/2022, 11:51 AM  Clinical Narrative:   This RNCM notified Starr with [facility] to advise patient will not transfer to short term SNF today, due to being transferred to SDU last night. Per chart review: insurance authorization. Reference ID # is: [id number]. Patient approved for 04/14/2022-04/18/2022.   TOC will continue to follow for needs.    Expected Discharge Plan: [place] Barriers to Discharge: Continued Medical Work up  Expected Discharge Plan and Services   Discharge Planning Services: CM Consult Post Acute Care Choice: [place] Living arrangements for the past 2 months: Apartment                                       Social Determinants of Health (SDOH) Interventions SDOH Screenings   Food Insecurity: No Food Insecurity (04/08/2022)  Housing: Low Risk  (09/13/2021)  Transportation Needs: No Transportation Needs (04/08/2022)  Utilities: Not At Risk (04/08/2022)  Alcohol Screen: Low Risk  (09/13/2021)  Depression (PHQ2-9): Low Risk  (09/13/2021)  Financial Resource Strain: Low Risk  (09/13/2021)  Physical Activity: Inactive (09/13/2021)  Social Connections: Socially Isolated (09/13/2021)  Stress: No Stress Concern Present (09/13/2021)  Tobacco Use: Low Risk  (04/08/2022)    Readmission Risk Interventions     No data to display

## 2022-04-14 NOTE — Progress Notes (Signed)
Attempted to call point of contacts Irihanna Pressnell and Abia Hockenbury to notify them of pt's status and updates, but no answer from any of the contact numbers.

## 2022-04-14 NOTE — Progress Notes (Signed)
Removed oxygen and O2 sats decreased to 87%. Disoriented and still refusing to wear bipap mask. When asked where is she at, she states that she is being held captive in a car. She keeps removing oxygen [place]. Attempts to reorient her to tell her she's in the hospital are unsuccessful at this time. Placed [place] back on her and sats 93%. Safety sitter remains at bedside.  Continuing to monitor.

## 2022-04-14 NOTE — Progress Notes (Signed)
PT Cancellation Note  Patient Details Name: Allison Short MRN: [id number] DOB: 20-Jan-1951   Cancelled Treatment:    Reason Eval/Treat Not Completed: Other (comment) pt very active and disoriented last night, RN would like her to sleep as long as possible this morning and asks for PT to check back in in the afternoon. Will attempt to return if time/schedule allow and if pt is appropriate.   Deniece Ree PT DPT PN2

## 2022-04-14 NOTE — Progress Notes (Addendum)
       CROSS COVER NOTE  NAME: Allison Short MRN: [id number] DOB : [birth year]    Date of Service   04/14/2022     HPI/Events of Note   1945- Patient reported to be agitated and restless.  Intermittently confused.   Per nursing staff, patient has been removing medical equipment including BiPAP multiple times. Unfortunately, patient is not easily redirected.  Order for safety sitter placed, will also trial 2.5 mg IV Haldol for agitation.     2030- During bedside assessment, patient was initially resting while wearing BiPAP.  However, shortly after this assessment, I witnessed her ripping this medical equipment off and throwing it across the room.  Although agitated by BiPAP, patient is alert and oriented x 3.  She continues to verbally refuse BiPAP at this time.  She denies any shortness of breath and only endorses nasal congestion.  She is currently on 3 L nasal cannula with optimal saturations.     2100- VBG results still showing compensated hypercapnia--pH 7.37, pCO2 82, pO2 35, bicarb 47.4.  Despite patient education regarding BiPAP use, patient continued to refuse therapy.  She is arousable and alert and oriented x 3, capable of understanding current medical condition.  Patient wishes to remain full code.    0300-RN reports patient woke up and removed nasal cannula, O2 dropped to 87%.  Becoming more disoriented and still refusing to wear BiPAP mask.  RN also concerned with possible cardiac ectopy frequency. Pt denies CP, palpitations, SOB at this time.  EKG, CBC, BMP, mag, VBG ordered.    Interventions/ Plan   Haldol BiPAP therapy VBG     Raenette Rover, DNP, [place]

## 2022-04-15 ENCOUNTER — Encounter (HOSPITAL_COMMUNITY): Payer: Self-pay | Admitting: Family Medicine

## 2022-04-15 ENCOUNTER — Inpatient Hospital Stay (HOSPITAL_COMMUNITY): Payer: Medicare HMO

## 2022-04-15 ENCOUNTER — Other Ambulatory Visit (HOSPITAL_COMMUNITY): Payer: Medicare HMO

## 2022-04-15 DIAGNOSIS — J9612 Chronic respiratory failure with hypercapnia: Secondary | ICD-10-CM | POA: Insufficient documentation

## 2022-04-15 DIAGNOSIS — E662 Morbid (severe) obesity with alveolar hypoventilation: Secondary | ICD-10-CM | POA: Diagnosis not present

## 2022-04-15 DIAGNOSIS — F05 Delirium due to known physiological condition: Secondary | ICD-10-CM | POA: Insufficient documentation

## 2022-04-15 DIAGNOSIS — I890 Lymphedema, not elsewhere classified: Secondary | ICD-10-CM | POA: Diagnosis not present

## 2022-04-15 DIAGNOSIS — J9611 Chronic respiratory failure with hypoxia: Secondary | ICD-10-CM | POA: Diagnosis not present

## 2022-04-15 DIAGNOSIS — J9621 Acute and chronic respiratory failure with hypoxia: Secondary | ICD-10-CM | POA: Insufficient documentation

## 2022-04-15 DIAGNOSIS — I10 Essential (primary) hypertension: Secondary | ICD-10-CM | POA: Diagnosis not present

## 2022-04-15 LAB — BASIC METABOLIC PANEL
Anion gap: 8 (ref 5–15)
BUN: 33 mg/dL — ABNORMAL HIGH (ref 8–23)
CO2: 40 mmol/L — ABNORMAL HIGH (ref 22–32)
Calcium: 8.6 mg/dL — ABNORMAL LOW (ref 8.9–10.3)
Chloride: 88 mmol/L — ABNORMAL LOW (ref 98–111)
Creatinine, Ser: 1.11 mg/dL — ABNORMAL HIGH (ref 0.44–1.00)
GFR, Estimated: 52 mL/min — ABNORMAL LOW (ref >60)
Glucose, Bld: 121 mg/dL — ABNORMAL HIGH (ref 70–99)
Potassium: 3.4 mmol/L — ABNORMAL LOW (ref 3.5–5.1)
Sodium: 136 mmol/L (ref 135–145)

## 2022-04-15 LAB — BLOOD GAS, VENOUS
Acid-Base Excess: 20.9 mmol/L — ABNORMAL HIGH (ref 0.0–2.0)
Bicarbonate: 49.8 mmol/L — ABNORMAL HIGH (ref 20.0–28.0)
O2 Saturation: 65.2 %
Patient temperature: 36.4
pCO2, Ven: 73 mmHg (ref 44–60)
pH, Ven: 7.44 — ABNORMAL HIGH (ref 7.25–7.43)
pO2, Ven: 36 mmHg (ref 32–45)

## 2022-04-15 LAB — CBC
HCT: 38.8 % (ref 36.0–46.0)
Hemoglobin: 12.1 g/dL (ref 12.0–15.0)
MCH: 32.3 pg (ref 26.0–34.0)
MCHC: 31.2 g/dL (ref 30.0–36.0)
MCV: 103.5 fL — ABNORMAL HIGH (ref 80.0–100.0)
Platelets: 222 10*3/uL (ref 150–400)
RBC: 3.75 MIL/uL — ABNORMAL LOW (ref 3.87–5.11)
RDW: 14.2 % (ref 11.5–15.5)
WBC: 5.7 10*3/uL (ref 4.0–10.5)
nRBC: 0 % (ref 0.0–0.2)

## 2022-04-15 LAB — TROPONIN I (HIGH SENSITIVITY)
Troponin I (High Sensitivity): 57 ng/L — ABNORMAL HIGH (ref <18)
Troponin I (High Sensitivity): 60 ng/L — ABNORMAL HIGH (ref <18)

## 2022-04-15 LAB — AMMONIA: Ammonia: 25 umol/L (ref 9–35)

## 2022-04-15 LAB — MAGNESIUM: Magnesium: 1.6 mg/dL — ABNORMAL LOW (ref 1.7–2.4)

## 2022-04-15 MED ORDER — FUROSEMIDE 10 MG/ML IJ SOLN
40.0000 mg | Freq: Four times a day (QID) | INTRAMUSCULAR | Status: AC
  Administered 2022-04-15 (×2): 40 mg via INTRAVENOUS
  Filled 2022-04-15 (×2): qty 4

## 2022-04-15 MED ORDER — POTASSIUM CHLORIDE CRYS ER 20 MEQ PO TBCR
40.0000 meq | EXTENDED_RELEASE_TABLET | Freq: Once | ORAL | Status: AC
  Administered 2022-04-15: 40 meq via ORAL
  Filled 2022-04-15: qty 2

## 2022-04-15 MED ORDER — BISACODYL 5 MG PO TBEC
10.0000 mg | DELAYED_RELEASE_TABLET | Freq: Every day | ORAL | Status: DC
  Administered 2022-04-15 – 2022-04-29 (×13): 10 mg via ORAL
  Filled 2022-04-15 (×16): qty 2

## 2022-04-15 MED ORDER — METOPROLOL TARTRATE 50 MG PO TABS
50.0000 mg | ORAL_TABLET | Freq: Two times a day (BID) | ORAL | Status: DC
  Administered 2022-04-15 – 2022-04-29 (×29): 50 mg via ORAL
  Filled 2022-04-15 (×9): qty 1
  Filled 2022-04-15: qty 2
  Filled 2022-04-15 (×3): qty 1
  Filled 2022-04-15: qty 2
  Filled 2022-04-15 (×2): qty 1
  Filled 2022-04-15: qty 2
  Filled 2022-04-15 (×12): qty 1

## 2022-04-15 MED ORDER — METOPROLOL TARTRATE 5 MG/5ML IV SOLN
5.0000 mg | Freq: Four times a day (QID) | INTRAVENOUS | Status: DC | PRN
  Administered 2022-04-15: 5 mg via INTRAVENOUS
  Filled 2022-04-15: qty 5

## 2022-04-15 MED ORDER — ACETAZOLAMIDE 250 MG PO TABS
500.0000 mg | ORAL_TABLET | Freq: Every day | ORAL | Status: AC
  Administered 2022-04-15 – 2022-04-17 (×3): 500 mg via ORAL
  Filled 2022-04-15 (×3): qty 2

## 2022-04-15 MED ORDER — POLYETHYLENE GLYCOL 3350 17 G PO PACK
17.0000 g | PACK | Freq: Every day | ORAL | Status: DC
  Administered 2022-04-16 – 2022-04-27 (×11): 17 g via ORAL
  Filled 2022-04-15 (×15): qty 1

## 2022-04-15 MED ORDER — ORAL CARE MOUTH RINSE: 15.0000 mL | OROMUCOSAL | Status: DC | PRN

## 2022-04-15 MED ORDER — LORAZEPAM 2 MG/ML PO CONC
0.5000 mg | Freq: Once | ORAL | Status: AC
  Administered 2022-04-15: 0.5 mg via ORAL
  Filled 2022-04-15: qty 1

## 2022-04-15 MED ORDER — FLUTICASONE PROPIONATE 50 MCG/ACT NA SUSP
2.0000 | Freq: Every day | NASAL | Status: DC
  Administered 2022-04-15 – 2022-04-29 (×9): 2 via NASAL
  Filled 2022-04-15 (×2): qty 16

## 2022-04-15 MED ORDER — HYDROCERIN EX CREA
TOPICAL_CREAM | Freq: Two times a day (BID) | CUTANEOUS | Status: DC
  Administered 2022-04-22 – 2022-04-28 (×2): 1 via TOPICAL
  Filled 2022-04-15 (×3): qty 113

## 2022-04-15 NOTE — Consult Note (Signed)
NAME:  Allison Short, MRN:  [id number], DOB:  [birth year], LOS: 6 ADMISSION DATE:  04/08/2022, CONSULTATION DATE:  04/15/2022 REFERRING MD:  Dr Zigmund Daniel, CHIEF COMPLAINT: Hypercapnic respiratory failure  History of Present Illness:  Asked to see patient for hypercapnic respiratory failure She was admitted with difficulty manage lymphedema, worsening swelling, lower worsening discomfort of lower extremities She is being diuresed at present Noted to have significant hypercapnia -Reason for the consultation  She is not aware that she snores, never had a sleep study Usually goes to sleep around 11, sleeps in a recliner Wakes up once or twice at night to use the bathroom Final wake up time about 6 AM She is tired during the day  She is not aware of having sleep apnea  Weight has been about the same  Patient states she is claustrophobic Does have a lot of nasal congestion  Pertinent  Medical History   Past Medical History:  Diagnosis Date   History of blood transfusion    2016   History of urinary tract infection    Hypertension    Non Hodgkin's lymphoma ([place]) 2008   Obesity    Osteopenia 04/2017   T score -1.4 FRAX 7.5% / 0.8%   Pancreatitis    Significant Hospital Events: Including procedures, antibiotic start and stop dates in addition to other pertinent events     Interim History / Subjective:  Awake, alert, interactive, drifts off to sleep  Objective   Blood pressure (!) 144/65, pulse (!) 102, temperature (!) 97.5 F (36.4 C), temperature source Oral, resp. rate (!) 23, height '5\' 2"'$  (1.575 m), weight (!) 167.6 kg, SpO2 100 %.        Intake/Output Summary (Last 24 hours) at 04/15/2022 1236 Last data filed at 04/15/2022 [id number] Gross per 24 hour  Intake 437.17 ml  Output 1850 ml  Net -1412.83 ml   Filed Weights   04/08/22 2046 04/13/22 1635  Weight: (!) 179.9 kg (!) 167.6 kg    Examination: General: Middle-age, does not appear to be in distress, obese HENT: Moist  oral mucosa Lungs: Clear breath sounds anteriorly Cardiovascular: S1-S2 appreciated Abdomen: Soft, bowel sounds appreciated Extremities: Edema with some redness Neuro: Alert and oriented, does drift off to sleep GU:   Resolved Hospital Problem list     Assessment & Plan:  [place] lady who is superobese with history of lymphedema, ABG showing severe hypercapnia Has never had a sleep study, was not concerned about sleep apnea previously Currently requires oxygen supplementation  Obesity hypoventilation syndrome Likely underlying significant obstructive sleep apnea Complicated by patient having claustrophobia and has not been able to tolerate keeping BiPAP on, has been refusing  Chronic hypoxemic respiratory failure  Will need a formal sleep study at some point  Will try BiPAP and adjust pressures to patient's comfort level Try different masks to see what she is able to tolerate -BiPAP of 15/5, with oxygen supplementation -Titrate for patient comfort -Trial with nasal mask  For nasal stuffiness-inhaled steroids  Does likely have a component of contraction alkalosis contributing -Will add Diamox -500 daily for 3 days and then may readjust depending on the effect -This should act as a respiratory stimulant and can also help correct metabolic alkalosis from contraction  Electrolyte derangement -Continue to replete  History of non-Hodgkin's lymphoma  History of chronic normocytic anemia  Chronic debility  Best Practice (right click and "Reselect all SmartList Selections" daily)   Diet/type: Regular consistency (see orders) DVT prophylaxis: LMWH GI prophylaxis: N/A  Lines: N/A Foley:  N/A Code Status:  full code Last date of multidisciplinary goals of care discussion [discussed with patient at bedside]  Labs   CBC: Recent Labs  Lab 04/10/22 0424 04/11/22 0411 04/13/22 0428 04/14/22 0323 04/15/22 0632  WBC 8.4 8.9 9.4 7.2 5.7  NEUTROABS 6.0  --  7.7  --   --    HGB 11.2* 11.9* 12.4 11.7* 12.1  HCT 36.9 39.5 41.1 37.6 38.8  MCV 106.6* 105.9* 106.5* 105.0* 103.5*  PLT 213 217 251 229 [id number]    Basic Metabolic Panel: Recent Labs  Lab 04/08/22 2111 04/09/22 0435 04/10/22 0424 04/11/22 0411 04/12/22 0952 04/13/22 0428 04/14/22 0323 04/15/22 0632  NA  --  140   < > 135 136 136 135 136  K  --  3.4*   < > 3.7 3.7 4.4 3.7 3.4*  CL  --  99   < > 89* 88* 90* 89* 88*  CO2  --  30   < > 33* 38* 35* 34* 40*  GLUCOSE  --  154*   < > 164* 170* 188* 126* 121*  BUN  --  23   < > '19 17 22 '$ 34* 33*  CREATININE  --  0.97   < > 0.96 0.80 1.29* 1.36* 1.11*  CALCIUM  --  8.5*   < > 8.6* 8.8* 8.7* 8.6* 8.6*  MG 1.6* 2.1  --   --   --  1.5* 1.8 1.6*   < > = values in this interval not displayed.   GFR: Estimated Creatinine Clearance: 69.2 mL/min (A) (by C-G formula based on SCr of 1.11 mg/dL (H)). Recent Labs  Lab 04/11/22 0411 04/13/22 0428 04/14/22 0323 04/15/22 0632  WBC 8.9 9.4 7.2 5.7    Liver Function Tests: Recent Labs  Lab 04/08/22 1918  AST 23  ALT 17  ALKPHOS 81  BILITOT 1.2  PROT 7.1  ALBUMIN 3.9   No results for input(s): "LIPASE", "AMYLASE" in the last 168 hours. Recent Labs  Lab 04/15/22 0959  AMMONIA 25    ABG    Component Value Date/Time   PHART 7.36 04/13/2022 1453   PCO2ART 80 (HH) 04/13/2022 1453   PO2ART 102 04/13/2022 1453   HCO3 49.8 (H) 04/15/2022 0959   TCO2 34 (H) 04/08/2022 1852   O2SAT 65.2 04/15/2022 0959     Coagulation Profile: No results for input(s): "INR", "PROTIME" in the last 168 hours.  Cardiac Enzymes: No results for input(s): "CKTOTAL", "CKMB", "CKMBINDEX", "TROPONINI" in the last 168 hours.  HbA1C: Hgb A1c MFr Bld  Date/Time Value Ref Range Status  11/20/2018 02:15 PM 6.2 4.6 - 6.5 % Final    Comment:    Glycemic Control Guidelines for People with Diabetes:Non Diabetic:  <6%Goal of Therapy: <7%Additional Action Suggested:  >8%     CBG: No results for input(s): "GLUCAP" in the  last 168 hours.  Review of Systems:   Awake and alert  Past Medical History:  She,  has a past medical history of History of blood transfusion, History of urinary tract infection, Hypertension, Non Hodgkin's lymphoma ([place]) (2008), Obesity, Osteopenia (04/2017), and Pancreatitis.   Surgical History:   Past Surgical History:  Procedure Laterality Date   BIOPSY THYROID     BONE MARROW BIOPSY     CESAREAN SECTION  10/26/1975   CHOLECYSTECTOMY     2006   PORTA CATH INSERTION     TONSILLECTOMY AND ADENOIDECTOMY     1952   TOTAL KNEE  ARTHROPLASTY Bilateral    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       Social History:   reports that she has never smoked. She has never used smokeless tobacco. She reports current alcohol use. She reports that she does not use drugs.   Family History:  Her family history includes Arthritis in her maternal grandmother; Diabetes in her son.   Allergies Allergies  Allergen Reactions   Ciprofloxin Hcl [Ciprofloxacin] Other (See Comments)    Unstable gait   Morphine And Related Anaphylaxis and Other (See Comments)    Tolerates hydrocodone   Asa [Aspirin] Hives and Other (See Comments)    Hives can become SEVERE, requiring intervention   Codeine Hives   Chia Oil Swelling and Other (See Comments)    Possibly "chia tea" = Face and lips became swollen   Erythromycin Other (See Comments)    Reaction not recalled   Nsaids Other (See Comments)    Lowers GFR   Penicillins Other (See Comments)    Reaction not recalled- was told to "never take this again" by family     Home Medications  Prior to Admission medications   Medication Sig Start Date End Date Taking? Authorizing Provider  acetaminophen (TYLENOL) 500 MG tablet Take 1,000 mg by mouth every 8 (eight) hours as needed for mild pain, moderate pain or headache.   Yes [provider]  Cholecalciferol (VITAMIN D3) 25 MCG (1000 UT) capsule Take 1,000 Units by mouth daily.   Yes [provider]  metoprolol  tartrate (LOPRESSOR) 50 MG tablet Take 1 tablet (50 mg total) by mouth 2 (two) times daily. KEEP APPT FOR REFILLS Patient taking differently: Take 50 mg by mouth 2 (two) times daily. 02/04/22 05/05/22 Yes Nafziger, Tommi Rumps, NP  MUCINEX SINUS-MAX CLEAR & COOL 0.05 % nasal spray Place 1 spray into both nostrils 2 (two) times daily as needed for congestion.   Yes [provider]  sodium chloride (OCEAN) 0.65 % SOLN nasal spray Place 1 spray into both nostrils as needed for congestion.   Yes [provider]  UNKNOWN TO PATIENT Take 0.5-1 ampules by nebulization See admin instructions. Unnamed neb treatment for post-nasal drip and coughing- Nebulize the contents of 0.5-1 ampule and inhale into the lungs once a day as needed for mucus removal or coughing   Yes [provider]  benazepril-hydrochlorthiazide (LOTENSIN HCT) 20-25 MG tablet Take 1 tablet by mouth daily. 04/12/22   Eugenie Filler, MD  cyanocobalamin (VITAMIN B12) 1000 MCG tablet Take 1 tablet (1,000 mcg total) by mouth daily. 04/13/22   Eugenie Filler, MD  doxycycline (VIBRA-TABS) 100 MG tablet Take 1 tablet (100 mg total) by mouth every 12 (twelve) hours for 4 days. 04/12/22 04/16/22  Eugenie Filler, MD  hydrOXYzine (ATARAX) 25 MG tablet Take 1 tablet (25 mg total) by mouth 3 (three) times daily as needed for anxiety. 04/12/22   Eugenie Filler, MD  ipratropium-albuterol (DUONEB) 0.5-2.5 (3) MG/3ML SOLN Take 3 mLs by nebulization 3 (three) times daily for 4 days, THEN 3 mLs every 6 (six) hours as needed. 04/12/22 07/25/22  Eugenie Filler, MD  loratadine (CLARITIN) 10 MG tablet Take 1 tablet (10 mg total) by mouth daily. 04/13/22   Eugenie Filler, MD  ondansetron (ZOFRAN) 4 MG tablet Take 1 tablet (4 mg total) by mouth every 6 (six) hours as needed for nausea. 04/12/22   Eugenie Filler, MD  pantoprazole (PROTONIX) 40 MG tablet Take 1 tablet (40 mg total) by mouth  daily. 04/13/22   Eugenie Filler, MD      Sherrilyn Rist, MD [place] PCCM Pager: See Shea Evans

## 2022-04-15 NOTE — Progress Notes (Addendum)
Patient currently in atrial fibrillation with heart rate fluctuating between 100-140's.   MD ordering Metoprolol for patient.  Patient has a pending CT scan ordered and MD Rodena Piety ordered a MRI as well. Night shift RN stated patient unable to lay flat and this is why CT scan not yet completed.   Patient was able to lay flat and tolerate bath with RN.   RN discussed ordered MRI/CT with patient, patient stated "no I can't, I'll freak out".  Per results review, patient had a CT in July.  RN inquired how patient handled that CT scan and patient stated "it was Okay".  Patient could not recall if any medication was administered prior to CT scan.   RN asked patient why she would "freak out" for current CT scan if she has tolerated them in the past and she said "I don't know". RN asked patient if she was claustrophobic, she said "maybe".  MD Rodena Piety aware of patient's concerns & advised will order pre-scan medication.  RN to try and get patient's head CT completed to determine how patient tolerates and if patient may be able to tolerate MRI.    1200 - Head CT completed.  Ativan 0.5 mg dose administered prior to CT scan.  Patient's vital signs stable throughout CT scan.  Patient stated during CT scan and afterwards that she had difficulty breathing during CT scan, patient was reassurred with emotional support and reassurance that vital signs were stable.  MD Rodena Piety notified.

## 2022-04-15 NOTE — Progress Notes (Signed)
Chaplain visited pt while rounding in ICU, pt shared her concerns for her son's health, he has cancer. I provided reflective listening, compassionate presence and Our Father prayer.    04/15/22 1500  Spiritual Encounters  Type of Visit Initial  Care provided to: Patient  Referral source Chaplain assessment  Reason for visit Routine spiritual support  OnCall Visit No  Spiritual Framework  Presenting Themes Coping tools;Impactful experiences and emotions  Community/Connection Family;Friend(s)  Patient Stress Factors Health changes  Family Stress Factors Health changes  Interventions  Spiritual Care Interventions Made Established relationship of care and support;Compassionate presence;Reflective listening;Prayer  Intervention Outcomes  Outcomes Connection to spiritual care

## 2022-04-15 NOTE — Progress Notes (Addendum)
PROGRESS NOTE    Allison Short  [id number] DOB: Sep 25, 1950 DOA: 04/08/2022 PCP: Dorothyann Peng, NP   Brief Narrative:  72 year old female history of hypertension, NHL, lymphedema presented to the ED with complaints of worsening lymphedema, redness which seem to be worsening with some erythema and heat as well as increased weeping and draining.  Patient states ongoing drainage despite using lymphedema pump and compression hose.  Patient unable to wear closed due to excessive drainage.  Patient presented to the ED, lower extremity Dopplers done negative for DVT.  Patient admitted due to concerns for probable lower extremity cellulitis.    Assessment & Plan:   Principal Problem:   Cellulitis of lower extremity with chronic  lymphedema Active Problems:   Hypokalemia   Essential hypertension   Normocytic anemia   Non Hodgkin's lymphoma ([place])   Morbid obesity (HCC)   Lymphedema   #1 right lower extremity cellulitis in the setting of chronic bilateral lymphedema-patient admitted with erythema edema and drainage from the right lower extremity.  Dopplers of the lower extremity negative for DVT.  Initially treated with vancomycin then changed to doxycycline.  Seen by PT recommending SNF. Patient was also receiving as needed Lasix.  #2 AKI creatinine improved with diuresis.  Continue to hold ACE inhibitor and HCTZ.  Continue Lasix.  And monitor creatinine.    # 3  Acute hypoxic hypercapnic respiratory failure-patient had increased oxygen requirement with complaints of shortness of breath.  Chest x-ray showed right basilar infiltrate on 04/13/2022 follow-up chest x-ray on 04/14/2022 showed pulmonary edema. She was started on cefepime on 04/13/2022 Continue Lasix Titrate oxygen down to keep her sats above 90% She probably has undiagnosed sleep apnea but is intolerant of BiPAP she will need outpatient sleep study. Patient did not have oxygen prior to admission. VBG 7.3 6/76/72/42 possibly  compensated Check VBG today  #4 delirium /confusion-this is probably multifactorial.  Prior workup included TSH B12 folate within normal limits.  Add on ammonia level.  CT was ordered yesterday but patient refused to lay flat. MRI brain today. Cefepime is not the best antibiotic in patients with delirium however with multiple drug allergies and developing pneumonia we do not have any other option and delirium had started even before cefepime was ever started. Outpatient neurology follow-up Outpatient  sleep study  #4 hypomagnesemia repleted.   #5 history of hypertension -Lasix and Lopressor #6 history of non-Hodgkin's lymphoma outpatient follow-up  #7 history of chronic normocytic anemia stable  #8 morbid obesity close outpatient follow-up with PCP and lifestyle modification  #9 debility seen by PT and OT recommending skilled nursing facility placement   Estimated body mass index is 67.58 kg/m as calculated from the following:   Height as of this encounter: '5\' 2"'$  (1.575 m).   Weight as of this encounter: 167.6 kg.  DVT prophylaxis: lovenox Code Status: full Family Communication: Discussed with son Ex-husband Shanee Dawdy contact information [phone] Disposition Plan:  Status is: Inpatient Remains inpatient appropriate because: Delirium hypoxia hypercapnia Consultants: none  Procedures: none Antimicrobials: doxy and maxipime   Subjective:  She is awake complaining of itching all over the body Dry skin noted Discussed with RN to give her a bed bath and put Eucerin lotion all over the body and to continue Atarax  Spoke with son Hilliard Clark he said he has stayed up for cancer and is going through treatments it would be good if we can have the patient's ex-husband who helps her at home put in her contact list. objective:  Vitals:   04/15/22 0500 04/15/22 0600 04/15/22 0726 04/15/22 0800  BP: (!) 142/78 (!) 148/113 (!) 191/94 (!) 163/110  Pulse: 69 79 88 69  Resp: 20 20 (!) 28 18   Temp:      TempSrc:      SpO2: 99% 92% 98% 97%  Weight:      Height:        Intake/Output Summary (Last 24 hours) at 04/15/2022 [id number] Last data filed at 04/15/2022 0730 Gross per 24 hour  Intake 200.12 ml  Output 3150 ml  Net -2949.88 ml    Filed Weights   04/08/22 2046 04/13/22 1635  Weight: (!) 179.9 kg (!) 167.6 kg    Examination:  General exam: Appears chronically ill appearing  Respiratory system: wheezing to auscultation. Respiratory effort normal. Cardiovascular system: S1 & S2 heard, RRR. No JVD, murmurs, rubs, gallops or clicks. No pedal edema. Gastrointestinal system: Abdomen is nondistended, soft and nontender. No organomegaly or masses felt. Normal bowel sounds heard. Central nervous system: Alert and oriented. No focal neurological deficits. Extremities: right thigh inner erythema  Psychiatry: Judgement and insight appear normal. Mood & affect appropriate.     Data Reviewed: I have personally reviewed following labs and imaging studies  CBC: Recent Labs  Lab 04/10/22 0424 04/11/22 0411 04/13/22 0428 04/14/22 0323 04/15/22 0632  WBC 8.4 8.9 9.4 7.2 5.7  NEUTROABS 6.0  --  7.7  --   --   HGB 11.2* 11.9* 12.4 11.7* 12.1  HCT 36.9 39.5 41.1 37.6 38.8  MCV 106.6* 105.9* 106.5* 105.0* 103.5*  PLT 213 217 251 229 [id number]    Basic Metabolic Panel: Recent Labs  Lab 04/08/22 2111 04/09/22 0435 04/10/22 0424 04/11/22 0411 04/12/22 0952 04/13/22 0428 04/14/22 0323 04/15/22 0632  NA  --  140   < > 135 136 136 135 136  K  --  3.4*   < > 3.7 3.7 4.4 3.7 3.4*  CL  --  99   < > 89* 88* 90* 89* 88*  CO2  --  30   < > 33* 38* 35* 34* 40*  GLUCOSE  --  154*   < > 164* 170* 188* 126* 121*  BUN  --  23   < > '19 17 22 '$ 34* 33*  CREATININE  --  0.97   < > 0.96 0.80 1.29* 1.36* 1.11*  CALCIUM  --  8.5*   < > 8.6* 8.8* 8.7* 8.6* 8.6*  MG 1.6* 2.1  --   --   --  1.5* 1.8  --    < > = values in this interval not displayed.    GFR: Estimated Creatinine Clearance:  69.2 mL/min (A) (by C-G formula based on SCr of 1.11 mg/dL (H)). Liver Function Tests: Recent Labs  Lab 04/08/22 1918  AST 23  ALT 17  ALKPHOS 81  BILITOT 1.2  PROT 7.1  ALBUMIN 3.9    No results for input(s): "LIPASE", "AMYLASE" in the last 168 hours. No results for input(s): "AMMONIA" in the last 168 hours. Coagulation Profile: No results for input(s): "INR", "PROTIME" in the last 168 hours. Cardiac Enzymes: No results for input(s): "CKTOTAL", "CKMB", "CKMBINDEX", "TROPONINI" in the last 168 hours. BNP (last 3 results) No results for input(s): "PROBNP" in the last 8760 hours. HbA1C: No results for input(s): "HGBA1C" in the last 72 hours. CBG: No results for input(s): "GLUCAP" in the last 168 hours. Lipid Profile: No results for input(s): "CHOL", "HDL", "LDLCALC", "TRIG", "CHOLHDL", "LDLDIRECT" in  the last 72 hours. Thyroid Function Tests: No results for input(s): "TSH", "T4TOTAL", "FREET4", "T3FREE", "THYROIDAB" in the last 72 hours. Anemia Panel: No results for input(s): "VITAMINB12", "FOLATE", "FERRITIN", "TIBC", "IRON", "RETICCTPCT" in the last 72 hours. Sepsis Labs: No results for input(s): "PROCALCITON", "LATICACIDVEN" in the last 168 hours.  Recent Results (from the past 240 hour(s))  MRSA Next Gen by PCR, Nasal     Status: None   Collection Time: 04/13/22  3:36 PM   Specimen: Nasal Mucosa; Nasal Swab  Result Value Ref Range Status   MRSA by PCR Next Gen NOT DETECTED NOT DETECTED Final    Comment: (NOTE) The GeneXpert MRSA Assay (FDA approved for NASAL specimens only), is one component of a comprehensive MRSA colonization surveillance program. It is not intended to diagnose MRSA infection nor to guide or monitor treatment for MRSA infections. Test performance is not FDA approved in patients less than 6 years old. Performed at [facility], [place] [address], [place], [place] [address]          Radiology Studies: [facility] Chest Port 1  View  Result Date: 04/14/2022 CLINICAL DATA:  [id number] Respiratory complication [id number] EXAM: PORTABLE CHEST 1 VIEW COMPARISON:  Chest x-ray 04/13/2022, chest x-ray 08/20/2020, CT angio chest 08/21/2020 FINDINGS: The heart and mediastinal contours are unchanged. Prominent hilar vasculature. Improved aeration of the left lower lobe. No focal consolidation. Mild pulmonary edema. No pleural effusion. No pneumothorax. No acute osseous abnormality. IMPRESSION: Pulmonary venous congestion with likely developing mild pulmonary edema. Electronically Signed   By: Iven Finn M.D.   On: 04/14/2022 03:35   DG Chest 1 View  Result Date: 04/13/2022 CLINICAL DATA:  Shortness of breath EXAM: CHEST  1 VIEW COMPARISON:  Portable exam 1039 hours compared to 08/20/2020 FINDINGS: Enlargement of cardiac silhouette with pulmonary vascular congestion. Mediastinal contour stable for technique. Mild LEFT basilar infiltrate. Remaining lungs clear. No pleural effusion or pneumothorax. BILATERAL glenohumeral degenerative changes. IMPRESSION: Mild LEFT basilar infiltrate favoring pneumonia. Electronically Signed   By: Lavonia Dana M.D.   On: 04/13/2022 13:23        Scheduled Meds:  Chlorhexidine Gluconate Cloth  6 each Topical Daily   vitamin B-12  500 mcg Oral Daily   doxycycline  100 mg Oral Q12H   enoxaparin (LOVENOX) injection  80 mg Subcutaneous Q24H   hydrocerin   Topical BID   loratadine  10 mg Oral Daily   mouth rinse  15 mL Mouth Rinse 4 times per day   pantoprazole  40 mg Oral Daily   Continuous Infusions:  ceFEPime (MAXIPIME) IV 2 g (04/15/22 0840)     LOS: 6 days    Time spent: 39 min  Georgette Shell, MD 04/15/2022, 9:23 AM

## 2022-04-16 DIAGNOSIS — I890 Lymphedema, not elsewhere classified: Secondary | ICD-10-CM

## 2022-04-16 DIAGNOSIS — J9621 Acute and chronic respiratory failure with hypoxia: Secondary | ICD-10-CM | POA: Diagnosis not present

## 2022-04-16 LAB — COMPREHENSIVE METABOLIC PANEL
ALT: 19 U/L (ref 0–44)
AST: 16 U/L (ref 15–41)
Albumin: 3 g/dL — ABNORMAL LOW (ref 3.5–5.0)
Alkaline Phosphatase: 76 U/L (ref 38–126)
Anion gap: 13 (ref 5–15)
BUN: 36 mg/dL — ABNORMAL HIGH (ref 8–23)
CO2: 38 mmol/L — ABNORMAL HIGH (ref 22–32)
Calcium: 9.1 mg/dL (ref 8.9–10.3)
Chloride: 89 mmol/L — ABNORMAL LOW (ref 98–111)
Creatinine, Ser: 1.17 mg/dL — ABNORMAL HIGH (ref 0.44–1.00)
GFR, Estimated: 49 mL/min — ABNORMAL LOW (ref >60)
Glucose, Bld: 145 mg/dL — ABNORMAL HIGH (ref 70–99)
Potassium: 3.7 mmol/L (ref 3.5–5.1)
Sodium: 140 mmol/L (ref 135–145)
Total Bilirubin: 0.8 mg/dL (ref 0.3–1.2)
Total Protein: 6.6 g/dL (ref 6.5–8.1)

## 2022-04-16 LAB — CBC
HCT: 47 % — ABNORMAL HIGH (ref 36.0–46.0)
Hemoglobin: 14.9 g/dL (ref 12.0–15.0)
MCH: 32.5 pg (ref 26.0–34.0)
MCHC: 31.7 g/dL (ref 30.0–36.0)
MCV: 102.6 fL — ABNORMAL HIGH (ref 80.0–100.0)
Platelets: 222 10*3/uL (ref 150–400)
RBC: 4.58 MIL/uL (ref 3.87–5.11)
RDW: 14.3 % (ref 11.5–15.5)
WBC: 6.5 10*3/uL (ref 4.0–10.5)
nRBC: 0 % (ref 0.0–0.2)

## 2022-04-16 LAB — BLOOD GAS, ARTERIAL
Acid-Base Excess: 20.3 mmol/L — ABNORMAL HIGH (ref 0.0–2.0)
Bicarbonate: 49.3 mmol/L — ABNORMAL HIGH (ref 20.0–28.0)
O2 Saturation: 98.2 %
Patient temperature: 37.1
pCO2 arterial: 76 mmHg (ref 32–48)
pH, Arterial: 7.42 (ref 7.35–7.45)
pO2, Arterial: 83 mmHg (ref 83–108)

## 2022-04-16 NOTE — Progress Notes (Signed)
   NAME:  Allison Short, MRN:  [id number], DOB:  1951/01/22, LOS: 7 ADMISSION DATE:  04/08/2022, CONSULTATION DATE:  2/23 REFERRING MD:  Zigmund Daniel, CHIEF COMPLAINT:  Hypercapnic respiratory failure    History of Present Illness:  72 y/o female admitted in context of worsening swelling/lymphedema.  Treated for cellulitis and given lasix for edema, noted to have hypoxemia, hypercarbia and pulmonary edema so PCCM consulted for further evaluation.  Pertinent  Medical History  Non-hodgkin's lymphoma Hypertension Obesity Pancreatitis  Significant Hospital Events: Including procedures, antibiotic start and stop dates in addition to other pertinent events   2/16 admitted by hospitalist service, vanc for cellulitis 2/19 doxycycline for cellulitis, vanc off 2/21 CXR > mild LLL infiltrate worrisome for pneumonia, added cefepime 2/22 CXR > pulm edema 2/23 CT head > NAICP, small R mastoid effusion  Interim History / Subjective:  Slept with BIPAP last night Drowsy this morning from antihistamine  Objective   Blood pressure 119/60, pulse 73, temperature 97.8 F (36.6 C), temperature source Oral, resp. rate 20, height 5' 2"$  (1.575 m), weight (!) 167.6 kg, SpO2 91 %.    FiO2 (%):  [30 %] 30 %   Intake/Output Summary (Last 24 hours) at 04/16/2022 0935 Last data filed at 04/16/2022 0000 Gross per 24 hour  Intake 580 ml  Output 3825 ml  Net -3245 ml   Filed Weights   04/08/22 2046 04/13/22 1635  Weight: (!) 179.9 kg (!) 167.6 kg    Examination:  General:  Morbidly obese, resting comfortably in bed HENT: NCAT OP clear PULM: CTA B, normal effort CV: RRR, no mgr GI: BS+, soft, nontender MSK: normal bulk and tone Derm: chronic edema Neuro: drowsy but wakes to voice, [facility] Problem list     Assessment & Plan:  Obesity hypoventilation syndrome with daytime resting hypercarbia Likely OSA Difficulty keeping BIPAP mask in place due to claustrophobia Chronic hypoxemic  respiratory failure Sinus congestion Concern for underlying metabolic acidosis; either due to hypokalemia or contraction alkalosis Non-hodgkin's lymphoma Chronic normocytic anemia Chronic debility HCAP NSTEMI> demand  Plan Diamox to continue BIPAP qHS, nasal mask Keep K > 4 Cefepime per primary Goal SaO2 88-92% She needs outpatient BIPAP for chronic hypercarbic respiratory failure.  Without it she will require hospitalization again  Best Practice (right click and "Reselect all SmartList Selections" daily)   Per primary  Critical care time: n/a     Roselie Awkward, MD [place] [facility] Pager: [phone] Cell: [phone] After 7:00 pm call Elink  [phone]  Roselie Awkward, MD [place] [facility] Pager: [phone] Cell: [phone] After 7:00 pm call Elink  [phone]

## 2022-04-16 NOTE — Progress Notes (Signed)
PROGRESS NOTE    Allison Short  [id number] DOB: 25-Dec-1950 DOA: 04/08/2022 PCP: Dorothyann Peng, NP   Brief Narrative:  72 year old female history of hypertension, NHL, lymphedema presented to the ED with complaints of worsening lymphedema, redness which seem to be worsening with some erythema and heat as well as increased weeping and draining.  Patient states ongoing drainage despite using lymphedema pump and compression hose.  Patient unable to wear closed due to excessive drainage.  Patient presented to the ED, lower extremity Dopplers done negative for DVT.  Patient admitted due to concerns for probable lower extremity cellulitis.    Assessment & Plan:   Principal Problem:   Cellulitis of lower extremity with chronic  lymphedema Active Problems:   Hypokalemia   Essential hypertension   Normocytic anemia   Non Hodgkin's lymphoma (HCC)   Morbid obesity (HCC)   Lymphedema   Acute on chronic respiratory failure with hypoxia (HCC)   Delirium due to multiple etiologies, acute, hyperactive   #1 right lower extremity cellulitis in the setting of chronic bilateral lymphedema-patient admitted with erythema edema and drainage from the right lower extremity.  Dopplers of the lower extremity negative for DVT.  Initially treated with vancomycin then changed to doxycycline.  Seen by PT recommending SNF. Patient was also receiving as needed Lasix.  #2 AKI creatinine improved with diuresis.  Continue to hold ACE inhibitor and HCTZ.  Continue Lasix.  And monitor creatinine.    # 3  Acute hypoxic hypercapnic respiratory failure-patient had increased oxygen requirement with complaints of shortness of breath.  Chest x-ray showed right basilar infiltrate on 04/13/2022 follow-up chest x-ray on 04/14/2022 showed pulmonary edema. She was started on cefepime on 04/13/2022 She was given as needed Lasix Titrate oxygen down to keep her sats above 90% She probably has undiagnosed sleep apnea but is intolerant of  BiPAP she will need outpatient sleep study. Patient did not have oxygen prior to admission. VBG 7.3 6/76/72/42 possibly compensated Decreasing oxygen requirement to 2 L today On diamox for 3 days per pccm  #4 delirium /confusion-this is probably multifactorial.  Prior workup included TSH B12 folate within normal limits.  Add on ammonia level.  CT was ordered yesterday but patient refused to lay flat. MRI brain today. Cefepime is not the best antibiotic in patients with delirium however with multiple drug allergies and developing pneumonia we do not have any other option and delirium had started even before cefepime was ever started. Outpatient neurology follow-up Outpatient  sleep study  #4 hypomagnesemia repleted.   #5 history of hypertension -Lasix and Lopressor #6 history of non-Hodgkin's lymphoma outpatient follow-up  #7 history of chronic normocytic anemia stable  #8 morbid obesity close outpatient follow-up with PCP and lifestyle modification  #9 debility seen by PT and OT recommending skilled nursing facility placement   Estimated body mass index is 67.58 kg/m as calculated from the following:   Height as of this encounter: '5\' 2"'$  (1.575 m).   Weight as of this encounter: 167.6 kg.  DVT prophylaxis: lovenox Code Status: full Family Communication: Discussed with son Ex-husband Quineisha Phenicie contact information [phone] Disposition Plan:  Status is: Inpatient Remains inpatient appropriate because: Delirium hypoxia hypercapnia Consultants: none  Procedures: none Antimicrobials: doxy and maxipime   Subjective: Cr 1.17  She says she used bipap overnite   objective: Vitals:   04/16/22 1000 04/16/22 1100 04/16/22 1155 04/16/22 1200  BP:    119/69  Pulse: 71 70  65  Resp: (!) 22 20  (!)  28  Temp:   97.9 F (36.6 C)   TempSrc:   Oral   SpO2: 94% 97%  97%  Weight:      Height:        Intake/Output Summary (Last 24 hours) at 04/16/2022 1310 Last data filed at  04/16/2022 0000 Gross per 24 hour  Intake 580 ml  Output 3225 ml  Net -2645 ml    Filed Weights   04/08/22 2046 04/13/22 1635  Weight: (!) 179.9 kg (!) 167.6 kg    Examination:  General exam: Appears chronically ill appearing  Respiratory system: wheezing to auscultation. Respiratory effort normal. Cardiovascular system: S1 & S2 heard, RRR. No JVD, murmurs, rubs, gallops or clicks. No pedal edema. Gastrointestinal system: Abdomen is nondistended, soft and nontender. No organomegaly or masses felt. Normal bowel sounds heard. Central nervous system: Alert and oriented. No focal neurological deficits. Extremities: right thigh inner erythema  Psychiatry: Judgement and insight appear normal. Mood & affect appropriate.     Data Reviewed: I have personally reviewed following labs and imaging studies  CBC: Recent Labs  Lab 04/10/22 0424 04/11/22 0411 04/13/22 0428 04/14/22 0323 04/15/22 0632 04/16/22 0248  WBC 8.4 8.9 9.4 7.2 5.7 6.5  NEUTROABS 6.0  --  7.7  --   --   --   HGB 11.2* 11.9* 12.4 11.7* 12.1 14.9  HCT 36.9 39.5 41.1 37.6 38.8 47.0*  MCV 106.6* 105.9* 106.5* 105.0* 103.5* 102.6*  PLT 213 217 251 229 222 [id number]    Basic Metabolic Panel: Recent Labs  Lab 04/12/22 0952 04/13/22 0428 04/14/22 0323 04/15/22 0632 04/16/22 0248  NA 136 136 135 136 140  K 3.7 4.4 3.7 3.4* 3.7  CL 88* 90* 89* 88* 89*  CO2 38* 35* 34* 40* 38*  GLUCOSE 170* 188* 126* 121* 145*  BUN 17 22 34* 33* 36*  CREATININE 0.80 1.29* 1.36* 1.11* 1.17*  CALCIUM 8.8* 8.7* 8.6* 8.6* 9.1  MG  --  1.5* 1.8 1.6*  --     GFR: Estimated Creatinine Clearance: 65.6 mL/min (A) (by C-G formula based on SCr of 1.17 mg/dL (H)). Liver Function Tests: Recent Labs  Lab 04/16/22 0248  AST 16  ALT 19  ALKPHOS 76  BILITOT 0.8  PROT 6.6  ALBUMIN 3.0*    No results for input(s): "LIPASE", "AMYLASE" in the last 168 hours. Recent Labs  Lab 04/15/22 0959  AMMONIA 25   Coagulation Profile: No  results for input(s): "INR", "PROTIME" in the last 168 hours. Cardiac Enzymes: No results for input(s): "CKTOTAL", "CKMB", "CKMBINDEX", "TROPONINI" in the last 168 hours. BNP (last 3 results) No results for input(s): "PROBNP" in the last 8760 hours. HbA1C: No results for input(s): "HGBA1C" in the last 72 hours. CBG: No results for input(s): "GLUCAP" in the last 168 hours. Lipid Profile: No results for input(s): "CHOL", "HDL", "LDLCALC", "TRIG", "CHOLHDL", "LDLDIRECT" in the last 72 hours. Thyroid Function Tests: No results for input(s): "TSH", "T4TOTAL", "FREET4", "T3FREE", "THYROIDAB" in the last 72 hours. Anemia Panel: No results for input(s): "VITAMINB12", "FOLATE", "FERRITIN", "TIBC", "IRON", "RETICCTPCT" in the last 72 hours. Sepsis Labs: No results for input(s): "PROCALCITON", "LATICACIDVEN" in the last 168 hours.  Recent Results (from the past 240 hour(s))  MRSA Next Gen by PCR, Nasal     Status: None   Collection Time: 04/13/22  3:36 PM   Specimen: Nasal Mucosa; Nasal Swab  Result Value Ref Range Status   MRSA by PCR Next Gen NOT DETECTED NOT DETECTED Final  Comment: (NOTE) The GeneXpert MRSA Assay (FDA approved for NASAL specimens only), is one component of a comprehensive MRSA colonization surveillance program. It is not intended to diagnose MRSA infection nor to guide or monitor treatment for MRSA infections. Test performance is not FDA approved in patients less than 30 years old. Performed at [facility], [place] [address], [place], [place] [address]          Radiology Studies: CT HEAD WO CONTRAST (5MM)  Result Date: 04/15/2022 CLINICAL DATA:  Altered mental status EXAM: CT HEAD WITHOUT CONTRAST TECHNIQUE: Contiguous axial images were obtained from the base of the skull through the vertex without intravenous contrast. RADIATION DOSE REDUCTION: This exam was performed according to the departmental dose-optimization program which includes  automated exposure control, adjustment of the mA and/or kV according to patient size and/or use of iterative reconstruction technique. COMPARISON:  None Available. FINDINGS: Brain: No evidence of acute infarction, hemorrhage, hydrocephalus, extra-axial collection or mass lesion/mass effect. Mild chronic microvascular ischemic change. Vascular: No hyperdense vessel or unexpected calcification. Skull: Normal. Negative for fracture or focal lesion. Sinuses/Orbits: Small right mastoid effusion. No middle ear effusion. Paranasal sinuses are clear. Orbits are unremarkable. Other: None. IMPRESSION: 1. No acute intracranial abnormality. 2. Small right mastoid effusion. Electronically Signed   By: Marin Roberts M.D.   On: 04/15/2022 13:58        Scheduled Meds:  acetaZOLAMIDE  500 mg Oral Daily   bisacodyl  10 mg Oral Daily   Chlorhexidine Gluconate Cloth  6 each Topical Daily   vitamin B-12  500 mcg Oral Daily   enoxaparin (LOVENOX) injection  80 mg Subcutaneous Q24H   fluticasone  2 spray Each Nare Daily   hydrocerin   Topical BID   loratadine  10 mg Oral Daily   metoprolol tartrate  50 mg Oral BID   pantoprazole  40 mg Oral Daily   polyethylene glycol  17 g Oral Daily   Continuous Infusions:  ceFEPime (MAXIPIME) IV 2 g (04/16/22 0825)     LOS: 7 days    Time spent: [place] min  Georgette Shell, MD 04/16/2022, 1:10 PM

## 2022-04-16 NOTE — Progress Notes (Signed)
Occupational Therapy Treatment Patient Details Name: Allison Short MRN: [id number] DOB: 1950-08-09 Today's Date: 04/16/2022   History of present illness Patient is a 72 year old female who presented  on 04/08/2022 with weeping and draining lymphedema with increased redness and heat. Patient was admitted with cellulitis of lower extremities with chronic lymphedema, hypokalemia, and expiratory wheezing. LE dopplers negative for DVT. PMH including but not limited to: B KA, HTN, Non-hodgkin's lymphoma, lymphedema, and morbid obesity.   OT comments  Patient supine in ICU bed when therapist entered the room. Patient wanting to sit on edge of bed. Bari chair unavailable and due to the width of her lower extremities regular recliner would not be comfortable. Patient min assist to transfer to edge of bed doing well with using bed rails and moving her Les to edge of bed. Seated at edge of bed patient uncomfortable due to the height of the bed. She was perched closer to the edge of the bed in order to have feet supported and reports discomfort. Reported mild dizziness at edge of bed. Performed bathing and grooming task at edge of bed to work on tolerance and for patient's comfort. She was able to stand with mod x 2 pulling up on back of recliner but only able to stand for a few seconds. She was able to assist with scooting towards head of bed with +2 assistance to pull on pad and assist with LLE. She was total assist for return to supine due to heaviness of her legs.    Recommendations for follow up therapy are one component of a multi-disciplinary discharge planning process, led by the attending physician.  Recommendations may be updated based on patient status, additional functional criteria and insurance authorization.    Follow Up Recommendations  Skilled nursing-short term rehab (<3 hours/day)     Assistance Recommended at Discharge Frequent or constant Supervision/Assistance  Patient can return home with the  following  Two people to help with walking and/or transfers;Assistance with cooking/housework;Direct supervision/assist for medications management;Assist for transportation;Help with stairs or ramp for entrance;Direct supervision/assist for financial management;Two people to help with bathing/dressing/bathroom   Equipment Recommendations  None recommended by OT    Recommendations for Other Services      Precautions / Restrictions Precautions Precautions: Fall Precaution Comments: Lymphedema in BLEs Restrictions Weight Bearing Restrictions: No          Balance Overall balance assessment: Needs assistance Sitting-balance support: No upper extremity supported, Feet supported Sitting balance-Leahy Scale: Fair     Standing balance support: Reliant on assistive device for balance, Bilateral upper extremity supported Standing balance-Leahy Scale: Poor                             ADL either performed or assessed with clinical judgement   ADL Overall ADL's : Needs assistance/impaired     Grooming: Set up;Sitting;Wash/dry hands Grooming Details (indicate cue type and reason): at edge of bed washed her hands Upper Body Bathing: Sitting Upper Body Bathing Details (indicate cue type and reason): In seated position at edge of bed therapist assisted patient with washing her back.                         Functional mobility during ADLs: +2 for physical assistance      Extremity/Trunk Assessment Upper Extremity Assessment Upper Extremity Assessment: Overall WFL for tasks assessed   Lower Extremity Assessment Lower Extremity Assessment: Defer to  PT evaluation   Cervical / Trunk Assessment Cervical / Trunk Assessment: Other exceptions Cervical / Trunk Exceptions: body habitus    Vision Baseline Vision/History: 1 Wears glasses            Cognition Arousal/Alertness: Awake/alert Behavior During Therapy: WFL for tasks assessed/performed Overall Cognitive  Status: Within Functional Limits for tasks assessed                                                     Pertinent Vitals/ Pain       Pain Assessment Pain Assessment: No/denies pain   Frequency  Min 2X/week        Progress Toward Goals  OT Goals(current goals can now be found in the care plan section)  Progress towards OT goals: OT to reassess next treatment  Acute Rehab OT Goals Patient Stated Goal: to get up OT Goal Formulation: With patient Time For Goal Achievement: 04/26/22 Potential to Achieve Goals: [place] Discharge plan remains appropriate    Co-evaluation                 AM-PAC OT "6 Clicks" Daily Activity     Outcome Measure   Help from another person eating meals?: A Little Help from another person taking care of personal grooming?: A Little Help from another person toileting, which includes using toliet, bedpan, or urinal?: A Lot Help from another person bathing (including washing, rinsing, drying)?: A Lot Help from another person to put on and taking off regular upper body clothing?: A Lot Help from another person to put on and taking off regular lower body clothing?: A Lot 6 Click Score: 14    End of Session Equipment Utilized During Treatment: Gait belt  OT Visit Diagnosis: Unsteadiness on feet (R26.81);Other abnormalities of gait and mobility (R26.89);Muscle weakness (generalized) (M62.81)   Activity Tolerance Patient tolerated treatment well   Patient Left in bed;with call bell/phone within reach;with bed alarm set   Nurse Communication Mobility status        Time: [id number] OT Time Calculation (min): 34 min  Charges: OT General Charges $OT Visit: 1 Visit OT Treatments $Self Care/Home Management : 8-22 mins  Gustavo Lah, OTR/L [place]  Office [phone]   Lenward Chancellor 04/16/2022, 10:35 AM

## 2022-04-16 NOTE — Progress Notes (Signed)
Physical Therapy Treatment Patient Details Name: Allison Short MRN: [id number] DOB: 1951/01/03 Today's Date: 04/16/2022   History of Present Illness Patient is a 72 year old female who presented  on 04/08/2022 with weeping and draining lymphedema with increased redness and heat. Patient was admitted with cellulitis of lower extremities with chronic lymphedema, hypokalemia, and expiratory wheezing. LE dopplers negative for DVT. PMH including but not limited to: B TKA, HTN, Non-hodgkin's lymphoma, lymphedema, and morbid obesity.  The patient is eager to mobilize. Patient  able  to move self to sitting. Max assistance of 2 to safely stand with UE support briefly,unable to safely take a step this visit.  Continue PT for mobility.  PT Comments       Recommendations for follow up therapy are one component of a multi-disciplinary discharge planning process, led by the attending physician.  Recommendations may be updated based on patient status, additional functional criteria and insurance authorization.  Follow Up Recommendations  Skilled nursing-short term rehab (<3 hours/day) Can patient physically be transported by private vehicle: No   Assistance Recommended at Discharge Frequent or constant Supervision/Assistance  Patient can return home with the following A little help with walking and/or transfers;A lot of help with bathing/dressing/bathroom;Assistance with cooking/housework;Assist for transportation;Help with stairs or ramp for entrance   Equipment Recommendations       Recommendations for Other Services       Precautions / Restrictions Precautions Precautions: Fall Precaution Comments: Lymphedema in BLEs Restrictions Weight Bearing Restrictions: No     Mobility  Bed Mobility Overal bed mobility: Needs Assistance Bed Mobility: Supine to Sit, Sit to Supine     Supine to sit: Min guard Sit to supine: +2 for safety/equipment, Total assist   General bed mobility comments: patient   able to move self to  sitting, cues for safety as patient close to bed edge, able to WS and scoot buttocks back onto bed and scoot  sideways along the bed. Total assistance to lift legs onto bed    Transfers Overall transfer level: Needs assistance   Transfers: Sit to/from Stand Sit to Stand: Mod assist, +2 physical assistance, +2 safety/equipment           General transfer comment: pulled  to stand  using  back of recliner. Did not take any steps    Ambulation/Gait                   Stairs             Wheelchair Mobility    Modified Rankin (Stroke Patients Only)       Balance Overall balance assessment: Needs assistance Sitting-balance support: No upper extremity supported, Feet supported Sitting balance-Leahy Scale: Fair     Standing balance support: Reliant on assistive device for balance, Bilateral upper extremity supported Standing balance-Leahy Scale: Poor                              Cognition Arousal/Alertness: Awake/alert Behavior During Therapy: WFL for tasks assessed/performed Overall Cognitive Status: Within Functional Limits for tasks assessed                                          Exercises      General Comments        Pertinent Vitals/Pain Pain Assessment Faces Pain Scale: Hurts even more Pain  Location: BLE R>L with movement Pain Descriptors / Indicators: Discomfort, Heaviness, Guarding Pain Intervention(s): Monitored during session, Limited activity within patient's tolerance    Home Living                          Prior Function            PT Goals (current goals can now be found in the care plan section) Progress towards PT goals: Progressing toward goals    Frequency    Min 2X/week      PT Plan Current plan remains appropriate    Co-evaluation PT/OT/SLP Co-Evaluation/Treatment: Yes Reason for Co-Treatment: For patient/therapist safety;To address functional/ADL  transfers PT goals addressed during session: Mobility/safety with mobility OT goals addressed during session: ADL's and self-care      AM-PAC PT "6 Clicks" Mobility   Outcome Measure  Help needed turning from your back to your side while in a flat bed without using bedrails?: A Little Help needed moving from lying on your back to sitting on the side of a flat bed without using bedrails?: Total Help needed moving to and from a bed to a chair (including a wheelchair)?: A Lot Help needed standing up from a chair using your arms (e.g., wheelchair or bedside chair)?: Total Help needed to walk in hospital room?: Total Help needed climbing 3-5 steps with a railing? : Total 6 Click Score: 9    End of Session Equipment Utilized During Treatment: Gait belt Activity Tolerance: Patient limited by fatigue Patient left: in bed;with call bell/phone within reach Nurse Communication: Mobility status PT Visit Diagnosis: Unsteadiness on feet (R26.81);Other abnormalities of gait and mobility (R26.89);Muscle weakness (generalized) (M62.81);History of falling (Z91.81);Pain     Time: [id number] PT Time Calculation (min) (ACUTE ONLY): 40 min  Charges:  $Therapeutic Activity: 23-37 mins                     [place] Office [phone] Weekend [id number]    Claretha Cooper 04/16/2022, 1:07 PM

## 2022-04-17 ENCOUNTER — Telehealth: Payer: Self-pay | Admitting: Pulmonary Disease

## 2022-04-17 ENCOUNTER — Inpatient Hospital Stay (HOSPITAL_COMMUNITY): Payer: Medicare HMO

## 2022-04-17 DIAGNOSIS — J9621 Acute and chronic respiratory failure with hypoxia: Secondary | ICD-10-CM | POA: Diagnosis not present

## 2022-04-17 DIAGNOSIS — I890 Lymphedema, not elsewhere classified: Secondary | ICD-10-CM | POA: Diagnosis not present

## 2022-04-17 MED ORDER — CAMPHOR-MENTHOL 0.5-0.5 % EX LOTN: TOPICAL_LOTION | CUTANEOUS | Status: DC | PRN

## 2022-04-17 NOTE — Progress Notes (Signed)
@   around 0030 the patient was found to have taken her bipap off, and refused help to put it back on saying "ill put it back on in a minute" . Patient was later found to have fallen asleep  .Supplemental  oxygen was applied at 2 L/min [facility]

## 2022-04-17 NOTE — Telephone Encounter (Signed)
Please arrange outpatient 30 min hospital follow up with a sleep specialist in 3-4 weeks for obesity hypoventilation syndrome, needs BIPAP titration.

## 2022-04-17 NOTE — Progress Notes (Signed)
   NAME:  Allison Short, MRN:  [id number], DOB:  1951-02-12, LOS: 8 ADMISSION DATE:  04/08/2022, CONSULTATION DATE:  2/23 REFERRING MD:  Zigmund Daniel, CHIEF COMPLAINT:  Hypercapnic respiratory failure    History of Present Illness:  72 y/o female admitted in context of worsening swelling/lymphedema.  Treated for cellulitis and given lasix for edema, noted to have hypoxemia, hypercarbia and pulmonary edema so PCCM consulted for further evaluation.  Pertinent  Medical History  Non-hodgkin's lymphoma Hypertension Obesity Pancreatitis  Significant Hospital Events: Including procedures, antibiotic start and stop dates in addition to other pertinent events   2/16 admitted by hospitalist service, vanc for cellulitis 2/19 doxycycline for cellulitis, vanc off 2/21 CXR > mild LLL infiltrate worrisome for pneumonia, added cefepime 2/22 CXR > pulm edema 2/23 CT head > NAICP, small R mastoid effusion, started BIPAP qHS per pulm consult  Interim History / Subjective:  Slept with BIPAP Says she felt a little confused this morning  Objective   Blood pressure 119/60, pulse 72, temperature 98 F (36.7 C), temperature source Oral, resp. rate (!) 22, height 5' 2"$  (1.575 m), weight (!) 167.6 kg, SpO2 93 %.    FiO2 (%):  [30 %] 30 %   Intake/Output Summary (Last 24 hours) at 04/17/2022 1117 Last data filed at 04/17/2022 0536 Gross per 24 hour  Intake --  Output 1750 ml  Net -1750 ml   Filed Weights   04/08/22 2046 04/13/22 1635  Weight: (!) 179.9 kg (!) 167.6 kg    Examination:  General:  Morbidly obese, resting comfortably in bed HENT: NCAT OP clear PULM: CTA B, normal effort CV: RRR, no mgr GI: BS+, soft, nontender MSK: normal bulk and tone Neuro: awake, alert, no distress, MAEW   Resolved Hospital Problem list     Assessment & Plan:  Obesity hypoventilation syndrome with daytime resting hypercarbia Likely OSA Difficulty keeping BIPAP mask in place due to claustrophobia Chronic hypoxemic  respiratory failure Sinus congestion Concern for underlying metabolic acidosis; either due to hypokalemia or contraction alkalosis Non-hodgkin's lymphoma Chronic normocytic anemia Chronic debility HCAP NSTEMI> demand  Plan Continue diamox through tomorrow then reassess volume status/labs Order placed for DME evaluation for home BIPAP Keep K > 4 BIPAP qHS, nasal mask recommended Goal SaO2 88-92%   Best Practice (right click and "Reselect all SmartList Selections" daily)   Per primary  Critical care time: n/a     Roselie Awkward, MD [place] [facility] Pager: [phone] Cell: [phone] After 7:00 pm call Elink  [phone]

## 2022-04-17 NOTE — Progress Notes (Signed)
Pt was found off of her bipap. RN said that pt took her mask off around midnight. She wore only for 1 hr or so. Pt is back on  and resting at this time.

## 2022-04-17 NOTE — Progress Notes (Signed)
PROGRESS NOTE    Allison Short  [id number] DOB: 11-27-1950 DOA: 04/08/2022 PCP: Allison Peng, NP   Brief Narrative:  72 year old female history of hypertension, NHL, lymphedema presented to the ED with complaints of worsening lymphedema, redness which seem to be worsening with some erythema and heat as well as increased weeping and draining.  Patient states ongoing drainage despite using lymphedema pump and compression hose.  Patient unable to wear closed due to excessive drainage.  Patient presented to the ED, lower extremity Dopplers done negative for DVT.  Patient admitted due to concerns for probable lower extremity cellulitis.    Assessment & Plan:   Principal Problem:   Cellulitis of lower extremity with chronic  lymphedema Active Problems:   Hypokalemia   Essential hypertension   Normocytic anemia   Non Hodgkin's lymphoma (HCC)   Morbid obesity (HCC)   Lymphedema   Acute on chronic respiratory failure with hypoxia (HCC)   Delirium due to multiple etiologies, acute, hyperactive   #1 right lower extremity cellulitis in the setting of chronic bilateral lymphedema-patient admitted with erythema edema and drainage from the right lower extremity.  Dopplers of the lower extremity negative for DVT.  Initially treated with vancomycin then changed to doxycycline.  Seen by PT recommending SNF. Patient receiving as needed Lasix.  #2 AKI creatinine improved with diuresis.  Continue to hold ACE inhibitor and HCTZ.  Continue Lasix.  And monitor creatinine.    # 3  Acute hypoxic hypercapnic respiratory failure/obesity hypoventilation syndrome-patient had increased oxygen requirement with complaints of shortness of breath.  Chest x-ray showed right basilar infiltrate on 04/13/2022 follow-up chest x-ray on 04/14/2022 showed pulmonary edema. She was started on cefepime on 04/13/2022 She was given as needed Lasix Titrate oxygen down to keep her sats above 90% She probably has undiagnosed sleep  apnea but is intolerant of BiPAP she will need outpatient sleep study. Patient did not have oxygen prior to admission. VBG 7.3 6/76/72/42 possibly compensated Decreasing oxygen requirement to 2 L today On diamox for 3 days per pccm  #4 delirium /confusion-this is probably multifactorial.  Prior workup included TSH B12 folate within normal limits.  Add on ammonia level.  CT was ordered yesterday but patient refused to lay flat. MRI brain today. Cefepime is not the best antibiotic in patients with delirium however with multiple drug allergies and developing pneumonia we do not have any other option and delirium had started even before cefepime was ever started. Outpatient neurology follow-up Outpatient  sleep study  #4 hypomagnesemia repleted.   #5 history of hypertension -Lasix PRN and Lopressor #6 history of non-Hodgkin's lymphoma outpatient follow-up  #7 history of chronic normocytic anemia stable  #8 morbid obesity close outpatient follow-up with PCP and lifestyle modification  #9 debility seen by PT and OT recommending skilled nursing facility placement   Estimated body mass index is 67.58 kg/m as calculated from the following:   Height as of this encounter: '5\' 2"'$  (1.575 m).   Weight as of this encounter: 167.6 kg.  DVT prophylaxis: lovenox Code Status: full Family Communication: Discussed with son Ex-husband Allison Short contact information [phone] Disposition Plan:  Status is: Inpatient Remains inpatient appropriate because: Delirium hypoxia hypercapnia Consultants: none  Procedures: none Antimicrobials: doxy and maxipime   Subjective: Used BiPAP probably for an hour overnight  objective: Vitals:   04/16/22 2100 04/16/22 2301 04/17/22 0024 04/17/22 0952  BP: (!) 114/48  107/86 119/60  Pulse: 73 73 79 72  Resp:  (!) 22  Temp: 99.1 F (37.3 C)  98 F (36.7 C)   TempSrc: Oral  Oral   SpO2: 99% 98% 93%   Weight:      Height:        Intake/Output Summary  (Last 24 hours) at 04/17/2022 1241 Last data filed at 04/17/2022 0536 Gross per 24 hour  Intake --  Output 1750 ml  Net -1750 ml    Filed Weights   04/08/22 2046 04/13/22 1635  Weight: (!) 179.9 kg (!) 167.6 kg    Examination:  General exam: Appears chronically ill appearing  Respiratory system: wheezing to auscultation. Respiratory effort normal. Cardiovascular system: S1 & S2 heard, RRR. No JVD, murmurs, rubs, gallops or clicks. No pedal edema. Gastrointestinal system: Abdomen is nondistended, soft and nontender. No organomegaly or masses felt. Normal bowel sounds heard. Central nervous system: Alert and oriented. No focal neurological deficits. Extremities: right thigh inner erythema  Psychiatry: Judgement and insight appear normal. Mood & affect appropriate.     Data Reviewed: I have personally reviewed following labs and imaging studies  CBC: Recent Labs  Lab 04/11/22 0411 04/13/22 0428 04/14/22 0323 04/15/22 0632 04/16/22 0248  WBC 8.9 9.4 7.2 5.7 6.5  NEUTROABS  --  7.7  --   --   --   HGB 11.9* 12.4 11.7* 12.1 14.9  HCT 39.5 41.1 37.6 38.8 47.0*  MCV 105.9* 106.5* 105.0* 103.5* 102.6*  PLT 217 251 229 222 [id number]    Basic Metabolic Panel: Recent Labs  Lab 04/12/22 0952 04/13/22 0428 04/14/22 0323 04/15/22 0632 04/16/22 0248  NA 136 136 135 136 140  K 3.7 4.4 3.7 3.4* 3.7  CL 88* 90* 89* 88* 89*  CO2 38* 35* 34* 40* 38*  GLUCOSE 170* 188* 126* 121* 145*  BUN 17 22 34* 33* 36*  CREATININE 0.80 1.29* 1.36* 1.11* 1.17*  CALCIUM 8.8* 8.7* 8.6* 8.6* 9.1  MG  --  1.5* 1.8 1.6*  --     GFR: Estimated Creatinine Clearance: 65.6 mL/min (A) (by C-G formula based on SCr of 1.17 mg/dL (H)). Liver Function Tests: Recent Labs  Lab 04/16/22 0248  AST 16  ALT 19  ALKPHOS 76  BILITOT 0.8  PROT 6.6  ALBUMIN 3.0*    No results for input(s): "LIPASE", "AMYLASE" in the last 168 hours. Recent Labs  Lab 04/15/22 0959  AMMONIA 25    Coagulation Profile: No  results for input(s): "INR", "PROTIME" in the last 168 hours. Cardiac Enzymes: No results for input(s): "CKTOTAL", "CKMB", "CKMBINDEX", "TROPONINI" in the last 168 hours. BNP (last 3 results) No results for input(s): "PROBNP" in the last 8760 hours. HbA1C: No results for input(s): "HGBA1C" in the last 72 hours. CBG: No results for input(s): "GLUCAP" in the last 168 hours. Lipid Profile: No results for input(s): "CHOL", "HDL", "LDLCALC", "TRIG", "CHOLHDL", "LDLDIRECT" in the last 72 hours. Thyroid Function Tests: No results for input(s): "TSH", "T4TOTAL", "FREET4", "T3FREE", "THYROIDAB" in the last 72 hours. Anemia Panel: No results for input(s): "VITAMINB12", "FOLATE", "FERRITIN", "TIBC", "IRON", "RETICCTPCT" in the last 72 hours. Sepsis Labs: No results for input(s): "PROCALCITON", "LATICACIDVEN" in the last 168 hours.  Recent Results (from the past 240 hour(s))  MRSA Next Gen by PCR, Nasal     Status: None   Collection Time: 04/13/22  3:36 PM   Specimen: Nasal Mucosa; Nasal Swab  Result Value Ref Range Status   MRSA by PCR Next Gen NOT DETECTED NOT DETECTED Final    Comment: (NOTE) The GeneXpert MRSA Assay (  FDA approved for NASAL specimens only), is one component of a comprehensive MRSA colonization surveillance program. It is not intended to diagnose MRSA infection nor to guide or monitor treatment for MRSA infections. Test performance is not FDA approved in patients less than 64 years old. Performed at [facility], [place] [address] [place], [place] [address]          Radiology Studies: No results found.      Scheduled Meds:  bisacodyl  10 mg Oral Daily   Chlorhexidine Gluconate Cloth  6 each Topical Daily   vitamin B-12  500 mcg Oral Daily   enoxaparin (LOVENOX) injection  80 mg Subcutaneous Q24H   fluticasone  2 spray Each Nare Daily   hydrocerin   Topical BID   loratadine  10 mg Oral Daily   metoprolol tartrate  50 mg Oral BID    pantoprazole  40 mg Oral Daily   polyethylene glycol  17 g Oral Daily   Continuous Infusions:  ceFEPime (MAXIPIME) IV 2 g (04/17/22 1006)     LOS: 8 days    Time spent: 55 min  Georgette Shell, MD 04/17/2022, 12:41 PM

## 2022-04-17 NOTE — Progress Notes (Addendum)
Bipap Standby pt said she would let RN know if she wants to put it on. No resp distress noted.

## 2022-04-17 NOTE — Plan of Care (Signed)
  Problem: Education: Goal: Knowledge of General Education information will improve Description: Including pain rating scale, medication(s)/side effects and non-pharmacologic comfort measures Outcome: Progressing   Problem: Clinical Measurements: Goal: Ability to maintain clinical measurements within normal limits will improve Outcome: Progressing Goal: Will remain free from infection Outcome: Progressing   Problem: Skin Integrity: Goal: Risk for impaired skin integrity will decrease Outcome: Progressing

## 2022-04-18 ENCOUNTER — Other Ambulatory Visit (HOSPITAL_COMMUNITY): Payer: Self-pay

## 2022-04-18 DIAGNOSIS — G4730 Sleep apnea, unspecified: Secondary | ICD-10-CM | POA: Diagnosis not present

## 2022-04-18 DIAGNOSIS — I890 Lymphedema, not elsewhere classified: Secondary | ICD-10-CM | POA: Diagnosis not present

## 2022-04-18 DIAGNOSIS — J9622 Acute and chronic respiratory failure with hypercapnia: Secondary | ICD-10-CM

## 2022-04-18 DIAGNOSIS — J9621 Acute and chronic respiratory failure with hypoxia: Secondary | ICD-10-CM | POA: Diagnosis not present

## 2022-04-18 LAB — BLOOD GAS, ARTERIAL
Acid-Base Excess: 12.1 mmol/L — ABNORMAL HIGH (ref 0.0–2.0)
Bicarbonate: 38.7 mmol/L — ABNORMAL HIGH (ref 20.0–28.0)
Drawn by: 560031
O2 Content: 2 L/min
O2 Saturation: 99.1 %
Patient temperature: 37
pCO2 arterial: 57 mmHg — ABNORMAL HIGH (ref 32–48)
pH, Arterial: 7.44 (ref 7.35–7.45)
pO2, Arterial: 92 mmHg (ref 83–108)

## 2022-04-18 MED ORDER — FUROSEMIDE 20 MG PO TABS
20.0000 mg | ORAL_TABLET | Freq: Every day | ORAL | 4 refills | Status: DC
  Filled 2022-04-18: qty 30, 30d supply, fill #0

## 2022-04-18 MED ORDER — METOPROLOL TARTRATE 50 MG PO TABS
25.0000 mg | ORAL_TABLET | Freq: Two times a day (BID) | ORAL | 0 refills | Status: DC
  Filled 2022-04-18: qty 90, 90d supply, fill #0

## 2022-04-18 MED ORDER — HYDROCORTISONE 1 % EX CREA
1.0000 | TOPICAL_CREAM | Freq: Three times a day (TID) | CUTANEOUS | 0 refills | Status: DC | PRN
  Filled 2022-04-18: qty 30, 7d supply, fill #0

## 2022-04-18 MED ORDER — FLUTICASONE PROPIONATE 50 MCG/ACT NA SUSP: 1.0000 | Freq: Every day | NASAL | 2 refills | Status: DC

## 2022-04-18 MED ORDER — HYDROCERIN EX CREA: 1.0000 | TOPICAL_CREAM | Freq: Two times a day (BID) | CUTANEOUS | 0 refills | Status: DC

## 2022-04-18 MED ORDER — POTASSIUM CHLORIDE CRYS ER 10 MEQ PO TBCR
10.0000 meq | EXTENDED_RELEASE_TABLET | Freq: Every day | ORAL | 4 refills | Status: DC
  Filled 2022-04-18: qty 30, 30d supply, fill #0

## 2022-04-18 MED ORDER — CAMPHOR-MENTHOL 0.5-0.5 % EX LOTN
TOPICAL_LOTION | CUTANEOUS | 0 refills | Status: DC | PRN
  Filled 2022-04-18: qty 222, fill #0

## 2022-04-18 MED ORDER — POLYETHYLENE GLYCOL 3350 17 G PO PACK
17.0000 g | PACK | Freq: Every day | ORAL | 0 refills | Status: DC
  Filled 2022-04-18: qty 14, 14d supply, fill #0

## 2022-04-18 MED ORDER — BISACODYL 5 MG PO TBEC
10.0000 mg | DELAYED_RELEASE_TABLET | Freq: Every day | ORAL | 0 refills | Status: DC
  Filled 2022-04-18: qty 30, 15d supply, fill #0

## 2022-04-18 MED ORDER — FUROSEMIDE 20 MG PO TABS
20.0000 mg | ORAL_TABLET | Freq: Every day | ORAL | Status: DC
  Administered 2022-04-18 – 2022-04-29 (×12): 20 mg via ORAL
  Filled 2022-04-18 (×12): qty 1

## 2022-04-18 NOTE — Discharge Summary (Signed)
Physician Discharge Summary  Allison Short [id number] DOB: [birth year] DOA: 04/08/2022  PCP: Dorothyann Peng, NP  Admit date: 04/08/2022 Discharge date: 04/18/2022  Admitted From: Home Disposition: Skilled nursing facility Recommendations for Outpatient Follow-up:  Follow up with PCP in 1-2 weeks Please obtain BMP/CBC in one week Please follow up with pulmonologist for sleep study and BiPAP at home Home Health: None Equipment/Devices: None   Discharge Condition: Stable CODE STATUS: Full code Diet recommendation: Cardiac diet Brief/Interim Summary:  72 year old female history of hypertension, NHL, lymphedema presented to the ED with complaints of worsening lymphedema, redness which seem to be worsening with some erythema and heat as well as increased weeping and draining.  Patient states ongoing drainage despite using lymphedema pump and compression hose.  Patient unable to wear closed due to excessive drainage.  Patient presented to the ED, lower extremity Dopplers done negative for DVT.  Patient admitted due to concerns for probable lower extremity cellulitis.   Discharge Diagnoses:  Principal Problem:   Cellulitis of lower extremity with chronic  lymphedema Active Problems:   Hypokalemia   Essential hypertension   Normocytic anemia   Non Hodgkin's lymphoma (HCC)   Morbid obesity (HCC)   Lymphedema   Acute on chronic respiratory failure with hypoxia (HCC)   Delirium due to multiple etiologies, acute, hyperactive    #1 right lower extremity cellulitis in the setting of chronic bilateral lymphedema-patient admitted with erythema edema and drainage from the right lower extremity.  Dopplers of the lower extremity negative for DVT.  Initially treated with vancomycin then changed to doxycycline.  Seen by PT recommending SNF. Patient receiving as needed Lasix. She finished a course of doxycycline.   #2 AKI creatinine improved with diuresis.  Continue to hold ACE inhibitor and HCTZ.   Continue Lasix.  And monitor creatinine.  Please obtain weekly BMPs.   # 3  Acute hypoxic hypercapnic respiratory failure/obesity hypoventilation syndrome/pneumonia-patient had increased oxygen requirement with complaints of shortness of breath.  She was treated with cefepime and Lasix.  Initially she was placed on BiPAP which she tolerated very poorly.  Since then she has been on nasal cannula at 1 or 2 L of oxygen to maintain her saturation above 90%.  She will need follow-up with pulmonologist for sleep study.   She probably has undiagnosed sleep apnea but is intolerant of BiPAP she will need outpatient sleep study.   #4 delirium /confusion-resolved.  This is probably multifactorial.  Prior workup included TSH B12 folate within normal limits.  Add on ammonia level.  CT head showed no acute findings.    #4 hypomagnesemia repleted.  #5 history of hypertension -continue Lasix and Lopressor #6 history of non-Hodgkin's lymphoma outpatient follow-up   #7 history of chronic normocytic anemia stable   #8 morbid obesity close outpatient follow-up with PCP and lifestyle modification   #9 debility seen by PT and OT recommending skilled nursing facility placement   Estimated body mass index is 67.58 kg/m as calculated from the following:   Height as of this encounter: '5\' 2"'$  (1.575 m).   Weight as of this encounter: 167.6 kg.  Discharge Instructions  Discharge Instructions     Diet - low sodium heart healthy   Complete by: As directed    Diet - low sodium heart healthy   Complete by: As directed    Diet - low sodium heart healthy   Complete by: As directed    Discharge wound care:   Complete by: As directed    Every  3 days     Comments: Single layer of xeroform to weeping areas of bilateral LEs, top with ABD pads, secure with kerlix and ACE wraps from toes to knees. Change every 3 days. OK to take ACE wraps off at HS and reapply each AM.  04/11/22 0906   Discharge wound care:   Complete  by: As directed    Single layer of Xeroform to weeping areas of bilateral lower extremities, top with ABD pads secure with Kerlix and ace wraps from toes to knees change every 2 days.  Okay to take Ace wrap off at night and reapply first thing in the morning.   Discharge wound care:   Complete by: As directed    Single level 0 form to weeping areas of bilateral lower extremity top with ABD pad secure with Kerlix and Ace wrap from toes to knees.  Change every 2 days.  Okay to take Ace wrap off at night and reapply in the morning.   Increase activity slowly   Complete by: As directed    Increase activity slowly   Complete by: As directed    Increase activity slowly   Complete by: As directed       Allergies as of 04/18/2022       Reactions   Ciprofloxin Hcl [ciprofloxacin] Other (See Comments)   Unstable gait   Morphine And Related Anaphylaxis, Other (See Comments)   Tolerates hydrocodone   Asa [aspirin] Hives, Other (See Comments)   Hives can become SEVERE, requiring intervention   Codeine Hives   Chia Oil Swelling, Other (See Comments)   Possibly "chia tea" = Face and lips became swollen   Erythromycin Other (See Comments)   Reaction not recalled   Nsaids Other (See Comments)   Lowers GFR   Penicillins Other (See Comments)   Reaction not recalled- was told to "never take this again" by family        Medication List     STOP taking these medications    benazepril-hydrochlorthiazide 20-25 MG tablet Commonly known as: LOTENSIN HCT       TAKE these medications    acetaminophen 500 MG tablet Commonly known as: TYLENOL Take 1,000 mg by mouth every 8 (eight) hours as needed for mild pain, moderate pain or headache.   bisacodyl 5 MG EC tablet Commonly known as: DULCOLAX Take 2 tablets (10 mg total) by mouth daily. Start taking on: April 19, 2022   camphor-menthol lotion Commonly known as: SARNA Apply topically as needed for itching.   cyanocobalamin 1000 MCG  tablet Commonly known as: VITAMIN B12 Take 1 tablet (1,000 mcg total) by mouth daily.   fluticasone 50 MCG/ACT nasal spray Commonly known as: FLONASE Place 1 spray into both nostrils daily. Start taking on: April 19, 2022   furosemide 20 MG tablet Commonly known as: LASIX Take 1 tablet (20 mg total) by mouth daily.   hydrocerin Crea Apply 1 Application topically 2 (two) times daily.   hydrocortisone cream 1 % Apply 1 Application topically 3 (three) times daily as needed for itching (minor skin irritation).   hydrOXYzine 25 MG tablet Commonly known as: ATARAX Take 1 tablet (25 mg total) by mouth 3 (three) times daily as needed for anxiety.   ipratropium-albuterol 0.5-2.5 (3) MG/3ML Soln Commonly known as: DUONEB Take 3 mLs by nebulization 3 (three) times daily for 4 days, THEN 3 mLs every 6 (six) hours as needed. Start taking on: April 12, 2022   loratadine 10 MG tablet Commonly  known as: CLARITIN Take 1 tablet (10 mg total) by mouth daily.   metoprolol tartrate 50 MG tablet Commonly known as: LOPRESSOR Take 0.5 tablets (25 mg total) by mouth 2 (two) times daily. KEEP APPT FOR REFILLS What changed: how much to take   Mucinex Sinus-Max Clear & Cool 0.05 % nasal spray Generic drug: oxymetazoline Place 1 spray into both nostrils 2 (two) times daily as needed for congestion.   ondansetron 4 MG tablet Commonly known as: ZOFRAN Take 1 tablet (4 mg total) by mouth every 6 (six) hours as needed for nausea.   pantoprazole 40 MG tablet Commonly known as: PROTONIX Take 1 tablet (40 mg total) by mouth daily.   polyethylene glycol 17 g packet Commonly known as: MIRALAX / GLYCOLAX Take 17 g by mouth daily. Start taking on: April 19, 2022   potassium chloride 10 MEQ tablet Commonly known as: KLOR-CON M Take 1 tablet (10 mEq total) by mouth daily.   sodium chloride 0.65 % Soln nasal spray Commonly known as: OCEAN Place 1 spray into both nostrils as needed for  congestion.   UNKNOWN TO PATIENT Take 0.5-1 ampules by nebulization See admin instructions. Unnamed neb treatment for post-nasal drip and coughing- Nebulize the contents of 0.5-1 ampule and inhale into the lungs once a day as needed for mucus removal or coughing   Vitamin D3 25 MCG (1000 UT) capsule Generic drug: Cholecalciferol Take 1,000 Units by mouth daily.               Discharge Care Instructions  (From admission, onward)           Start     Ordered   04/18/22 0000  Discharge wound care:       Comments: Single layer of Xeroform to weeping areas of bilateral lower extremities, top with ABD pads secure with Kerlix and ace wraps from toes to knees change every 2 days.  Okay to take Ace wrap off at night and reapply first thing in the morning.   04/18/22 1033   04/18/22 0000  Discharge wound care:       Comments: Single level 0 form to weeping areas of bilateral lower extremity top with ABD pad secure with Kerlix and Ace wrap from toes to knees.  Change every 2 days.  Okay to take Ace wrap off at night and reapply in the morning.   04/18/22 1034   04/12/22 0000  Discharge wound care:       Comments: Every 3 days     Comments: Single layer of xeroform to weeping areas of bilateral LEs, top with ABD pads, secure with kerlix and ACE wraps from toes to knees. Change every 3 days. OK to take ACE wraps off at [facility] and reapply each AM.  04/11/22 0906   04/12/22 1115            Contact information for follow-up providers     Nafziger, Tommi Rumps, NP. Schedule an appointment as soon as possible for a visit in 2 week(s).   Specialty: Family Medicine Why: f/u in 1-2 weeks. Needs referal back to lyphedema clinic Contact information: [place] [address] [place]. Call.   Why: [facility], [place], [place] [address] Call [phone] to get scheduled with the lymphedema clinic.             Contact  information for after-discharge care     [facility] .   Service: Skilled Nursing Contact information: [place] [address] [phone]                    Allergies  Allergen Reactions   Ciprofloxin Hcl [Ciprofloxacin] Other (See Comments)    Unstable gait   Morphine And Related Anaphylaxis and Other (See Comments)    Tolerates hydrocodone   Asa [Aspirin] Hives and Other (See Comments)    Hives can become SEVERE, requiring intervention   Codeine Hives   Chia Oil Swelling and Other (See Comments)    Possibly "chia tea" = Face and lips became swollen   Erythromycin Other (See Comments)    Reaction not recalled   Nsaids Other (See Comments)    Lowers GFR   Penicillins Other (See Comments)    Reaction not recalled- was told to "never take this again" by family    Consultations: pccm  Procedures/Studies: DG Chest 1 View  Result Date: 04/17/2022 CLINICAL DATA:  Shortness of breath. Lymphedema. EXAM: CHEST  1 VIEW COMPARISON:  04/14/2022 FINDINGS: Similar cardiomegaly. Improving vascular congestion. Subsegmental opacity in the left lung base likely atelectasis. Atelectasis versus trace fluid in the fissure in the right mid lung. No large subpulmonic effusion. No pneumothorax. IMPRESSION: 1. Improving vascular congestion. 2. Subsegmental opacity in the left lung base likely atelectasis. Electronically Signed   By: Keith Rake M.D.   On: 04/17/2022 17:13   CT HEAD WO CONTRAST (5MM)  Result Date: 04/15/2022 CLINICAL DATA:  Altered mental status EXAM: CT HEAD WITHOUT CONTRAST TECHNIQUE: Contiguous axial images were obtained from the base of the skull through the vertex without intravenous contrast. RADIATION DOSE REDUCTION: This exam was performed according to the departmental dose-optimization program  which includes automated exposure control, adjustment of the mA and/or kV according to patient size and/or use of iterative reconstruction technique. COMPARISON:  None Available. FINDINGS: Brain: No evidence of acute infarction, hemorrhage, hydrocephalus, extra-axial collection or mass lesion/mass effect. Mild chronic microvascular ischemic change. Vascular: No hyperdense vessel or unexpected calcification. Skull: Normal. Negative for fracture or focal lesion. Sinuses/Orbits: Small right mastoid effusion. No middle ear effusion. Paranasal sinuses are clear. Orbits are unremarkable. Other: None. IMPRESSION: 1. No acute intracranial abnormality. 2. Small right mastoid effusion. Electronically Signed   By: Marin Roberts M.D.   On: 04/15/2022 13:58   DG Chest Port 1 View  Result Date: 04/14/2022 CLINICAL DATA:  [id number] Respiratory complication [id number] EXAM: PORTABLE CHEST 1 VIEW COMPARISON:  Chest x-ray 04/13/2022, chest x-ray 08/20/2020, CT angio chest 08/21/2020 FINDINGS: The heart and mediastinal contours are unchanged. Prominent hilar vasculature. Improved aeration of the left lower lobe. No focal consolidation. Mild pulmonary edema. No pleural effusion. No pneumothorax. No acute osseous abnormality. IMPRESSION: Pulmonary venous congestion with likely developing mild pulmonary edema. Electronically Signed   By: Iven Finn M.D.   On: 04/14/2022 03:35   DG Chest 1 View  Result Date: 04/13/2022 CLINICAL DATA:  Shortness of breath EXAM: CHEST  1 VIEW COMPARISON:  Portable exam 1039 hours compared to 08/20/2020 FINDINGS: Enlargement of cardiac silhouette with pulmonary vascular congestion. Mediastinal contour stable for technique. Mild LEFT basilar infiltrate. Remaining lungs clear. No pleural effusion or pneumothorax. BILATERAL glenohumeral degenerative changes. IMPRESSION: Mild LEFT basilar infiltrate favoring pneumonia. Electronically Signed   By: Lavonia Dana M.D.   On: 04/13/2022 13:23   VAS [place]  LOWER  EXTREMITY VENOUS (DVT) (7a-7p)  Result Date: 04/08/2022  Lower Venous DVT Study Patient Name:  Allison Short  Date of Exam:   04/08/2022 Medical Rec #: [id number]   Accession #:    [id number] Date of Birth: 11/24/1950    Patient Gender: F Patient Age:   34 years Exam Location:  [facility] Procedure:      VAS [place] LOWER EXTREMITY VENOUS (DVT) Referring Phys: KEN LE --------------------------------------------------------------------------------  Indications: Edema.  Risk Factors: None identified. Limitations: Body habitus, poor ultrasound/tissue interface and patient positioning, skin induration, lymphedema. Comparison Study: No prior studies. Performing Technologist: Oliver Hum RVT  Examination Guidelines: A complete evaluation includes B-mode imaging, spectral Doppler, color Doppler, and power Doppler as needed of all accessible portions of each vessel. Bilateral testing is considered an integral part of a complete examination. Limited examinations for reoccurring indications may be performed as noted. The reflux portion of the exam is performed with the patient in reverse Trendelenburg.  +---------+---------------+---------+-----------+----------+-------------------+ RIGHT    CompressibilityPhasicitySpontaneityPropertiesThrombus Aging      +---------+---------------+---------+-----------+----------+-------------------+ CFV      Full           Yes      Yes                                      +---------+---------------+---------+-----------+----------+-------------------+ SFJ      Full                                                             +---------+---------------+---------+-----------+----------+-------------------+ FV Prox  Full                                                             +---------+---------------+---------+-----------+----------+-------------------+ FV Mid                                                Not well visualized  +---------+---------------+---------+-----------+----------+-------------------+ FV Distal                                             Not well visualized +---------+---------------+---------+-----------+----------+-------------------+ PFV                                                   Not well visualized +---------+---------------+---------+-----------+----------+-------------------+ POP      Full           Yes      No                                       +---------+---------------+---------+-----------+----------+-------------------+ PTV  Not well visualized +---------+---------------+---------+-----------+----------+-------------------+ PERO                                                  Not well visualized +---------+---------------+---------+-----------+----------+-------------------+   +---------+---------------+---------+-----------+----------+-------------------+ LEFT     CompressibilityPhasicitySpontaneityPropertiesThrombus Aging      +---------+---------------+---------+-----------+----------+-------------------+ CFV      Full           Yes      Yes                                      +---------+---------------+---------+-----------+----------+-------------------+ SFJ      Full                                                             +---------+---------------+---------+-----------+----------+-------------------+ FV Prox  Full                                                             +---------+---------------+---------+-----------+----------+-------------------+ FV Mid                                                Not well visualized +---------+---------------+---------+-----------+----------+-------------------+ FV Distal                                             Not well visualized +---------+---------------+---------+-----------+----------+-------------------+  PFV                                                   Not well visualized +---------+---------------+---------+-----------+----------+-------------------+ POP                                                   Not well visualized +---------+---------------+---------+-----------+----------+-------------------+ PTV                                                   Not well visualized +---------+---------------+---------+-----------+----------+-------------------+ PERO                                                  Not well visualized +---------+---------------+---------+-----------+----------+-------------------+     Summary: RIGHT: - The  visualized veins appear compressible. Limited study.  LEFT: - The visualized veins appear compressible. Limited study.  *See table(s) above for measurements and observations. Electronically signed by Deitra Mayo MD on 04/08/2022 at 7:06:26 PM.    Final    (Echo, Carotid, EGD, Colonoscopy, ERCP)    Subjective: Awake and alert no new complaints  Discharge Exam: Vitals:   04/17/22 2149 04/18/22 0530  BP: (!) 133/59 (!) 128/59  Pulse: 73 64  Resp: 20 18  Temp: 98.1 F (36.7 C) 97.7 F (36.5 C)  SpO2: 98% 100%   Vitals:   04/17/22 0952 04/17/22 1456 04/17/22 2149 04/18/22 0530  BP: 119/60 (!) 122/56 (!) 133/59 (!) 128/59  Pulse: 72 64 73 64  Resp:  '16 20 18  '$ Temp:  98.3 F (36.8 C) 98.1 F (36.7 C) 97.7 F (36.5 C)  TempSrc:  Oral Oral Oral  SpO2:  98% 98% 100%  Weight:      Height:        General: Pt is alert, awake, not in acute distress Cardiovascular: RRR, S1/S2 +, no rubs, no gallops Respiratory: CTA bilaterally, no wheezing, no rhonchi Abdominal: Soft, NT, ND, bowel sounds + Extremities:  2 plus edema, no cyanosis    The results of significant diagnostics from this hospitalization (including imaging, microbiology, ancillary and laboratory) are listed below for reference.     Microbiology: Recent Results  (from the past 240 hour(s))  MRSA Next Gen by PCR, Nasal     Status: None   Collection Time: 04/13/22  3:36 PM   Specimen: Nasal Mucosa; Nasal Swab  Result Value Ref Range Status   MRSA by PCR Next Gen NOT DETECTED NOT DETECTED Final    Comment: (NOTE) The GeneXpert MRSA Assay (FDA approved for NASAL specimens only), is one component of a comprehensive MRSA colonization surveillance program. It is not intended to diagnose MRSA infection nor to guide or monitor treatment for MRSA infections. Test performance is not FDA approved in patients less than 43 years old. Performed at [facility], [place] [address] [place], [place] [address]      Labs: BNP (last 3 results) No results for input(s): "BNP" in the last 8760 hours. Basic Metabolic Panel: Recent Labs  Lab 04/12/22 0952 04/13/22 0428 04/14/22 0323 04/15/22 0632 04/16/22 0248  NA 136 136 135 136 140  K 3.7 4.4 3.7 3.4* 3.7  CL 88* 90* 89* 88* 89*  CO2 38* 35* 34* 40* 38*  GLUCOSE 170* 188* 126* 121* 145*  BUN 17 22 34* 33* 36*  CREATININE 0.80 1.29* 1.36* 1.11* 1.17*  CALCIUM 8.8* 8.7* 8.6* 8.6* 9.1  MG  --  1.5* 1.8 1.6*  --    Liver Function Tests: Recent Labs  Lab 04/16/22 0248  AST 16  ALT 19  ALKPHOS 76  BILITOT 0.8  PROT 6.6  ALBUMIN 3.0*   No results for input(s): "LIPASE", "AMYLASE" in the last 168 hours. Recent Labs  Lab 04/15/22 0959  AMMONIA 25   CBC: Recent Labs  Lab 04/13/22 0428 04/14/22 0323 04/15/22 0632 04/16/22 0248  WBC 9.4 7.2 5.7 6.5  NEUTROABS 7.7  --   --   --   HGB 12.4 11.7* 12.1 14.9  HCT 41.1 37.6 38.8 47.0*  MCV 106.5* 105.0* 103.5* 102.6*  PLT 251 229 222 222   Cardiac Enzymes: No results for input(s): "CKTOTAL", "CKMB", "CKMBINDEX", "TROPONINI" in the last 168 hours. BNP: Invalid input(s): "POCBNP" CBG: No results for input(s): "GLUCAP" in the last  168 hours. D-Dimer No results for input(s): "DDIMER" in the last 72 hours. Hgb A1c No results  for input(s): "HGBA1C" in the last 72 hours. Lipid Profile No results for input(s): "CHOL", "HDL", "LDLCALC", "TRIG", "CHOLHDL", "LDLDIRECT" in the last 72 hours. Thyroid function studies No results for input(s): "TSH", "T4TOTAL", "T3FREE", "THYROIDAB" in the last 72 hours.  Invalid input(s): "FREET3" Anemia work up No results for input(s): "VITAMINB12", "FOLATE", "FERRITIN", "TIBC", "IRON", "RETICCTPCT" in the last 72 hours. Urinalysis    Component Value Date/Time   COLORURINE YELLOW 04/14/2022 [place] 04/14/2022 1608   LABSPEC 1.010 04/14/2022 1608   PHURINE 5.0 04/14/2022 1608   GLUCOSEU NEGATIVE 04/14/2022 1608   HGBUR NEGATIVE 04/14/2022 1608   BILIRUBINUR NEGATIVE 04/14/2022 1608   KETONESUR NEGATIVE 04/14/2022 1608   PROTEINUR NEGATIVE 04/14/2022 1608   NITRITE NEGATIVE 04/14/2022 1608   LEUKOCYTESUR NEGATIVE 04/14/2022 1608   Sepsis Labs Recent Labs  Lab 04/13/22 0428 04/14/22 0323 04/15/22 0632 04/16/22 0248  WBC 9.4 7.2 5.7 6.5   Microbiology Recent Results (from the past 240 hour(s))  MRSA Next Gen by PCR, Nasal     Status: None   Collection Time: 04/13/22  3:36 PM   Specimen: Nasal Mucosa; Nasal Swab  Result Value Ref Range Status   MRSA by PCR Next Gen NOT DETECTED NOT DETECTED Final    Comment: (NOTE) The GeneXpert MRSA Assay (FDA approved for NASAL specimens only), is one component of a comprehensive MRSA colonization surveillance program. It is not intended to diagnose MRSA infection nor to guide or monitor treatment for MRSA infections. Test performance is not FDA approved in patients less than 77 years old. Performed at [facility], [place] [address] [place], [place] [address]      Time coordinating discharge: 38 minutes  SIGNED:  Georgette Shell, MD  [facility] 04/18/2022, 10:45 AM

## 2022-04-18 NOTE — Care Management Important Message (Signed)
Important Message  Patient Details IM Letter given. Name: Allison Short MRN: [id number] Date of Birth: 1950-05-10   Medicare Important Message Given:  Yes     Kerin Salen 04/18/2022, 1:34 PM

## 2022-04-18 NOTE — Progress Notes (Signed)
PROGRESS NOTE    Allison Short  [id number] DOB: [birth year] DOA: 04/08/2022 PCP: Allison Peng, NP   Brief Narrative:  72 year old female history of hypertension, NHL, lymphedema presented to the ED with complaints of worsening lymphedema, redness which seem to be worsening with some erythema and heat as well as increased weeping and draining.  Patient states ongoing drainage despite using lymphedema pump and compression hose.  Patient unable to wear closed due to excessive drainage.  Patient presented to the ED, lower extremity Dopplers done negative for DVT.  Patient admitted due to concerns for probable lower extremity cellulitis.    Assessment & Plan:   Principal Problem:   Cellulitis of lower extremity with chronic  lymphedema Active Problems:   Hypokalemia   Essential hypertension   Normocytic anemia   Non Hodgkin's lymphoma (HCC)   Morbid obesity (HCC)   Lymphedema   Acute on chronic respiratory failure with hypoxia (HCC)   Delirium due to multiple etiologies, acute, hyperactive   #1 right lower extremity cellulitis in the setting of chronic bilateral lymphedema-patient admitted with erythema edema and drainage from the right lower extremity.  Dopplers of the lower extremity negative for DVT.  Initially treated with vancomycin then changed to doxycycline.  Seen by PT recommending SNF. Patient receiving as needed Lasix.  #2 AKI creatinine improved with diuresis.  Continue to hold ACE inhibitor and HCTZ.  Continue Lasix.  And monitor creatinine.    # 3  Acute hypoxic hypercapnic respiratory failure/obesity hypoventilation syndrome-patient had increased oxygen requirement with complaints of shortness of breath.  Chest x-ray showed right basilar infiltrate on 04/13/2022 follow-up chest x-ray on 04/14/2022 showed pulmonary edema. She was started on cefepime on 04/13/2022 She was given as needed Lasix Titrate oxygen down to keep her sats above 90% She probably has undiagnosed sleep  apnea but is intolerant of BiPAP she will need outpatient sleep study. Patient did not have oxygen prior to admission. VBG 7.3 6/76/72/42 possibly compensated Decreasing oxygen requirement to 2 L today On diamox for 3 days per pccm last dose today ABG today  #4 delirium /confusion-this is probably multifactorial.  Prior workup included TSH B12 folate within normal limits.  Add on ammonia level.  CT was ordered yesterday but patient refused to lay flat. MRI brain today. Cefepime is not the best antibiotic in patients with delirium however with multiple drug allergies and developing pneumonia we do not have any other option and delirium had started even before cefepime was ever started. Outpatient neurology follow-up Outpatient  sleep study  #4 hypomagnesemia repleted.   #5 history of hypertension -Lasix PRN and Lopressor #6 history of non-Hodgkin's lymphoma outpatient follow-up  #7 history of chronic normocytic anemia stable  #8 morbid obesity close outpatient follow-up with PCP and lifestyle modification  #9 debility seen by PT and OT recommending skilled nursing facility placement   Estimated body mass index is 67.58 kg/m as calculated from the following:   Height as of this encounter: '5\' 2"'$  (1.575 m).   Weight as of this encounter: 167.6 kg.  DVT prophylaxis: lovenox Code Status: full Family Communication: Discussed with son Ex-husband Allison Short contact information [phone] Disposition Plan:  Status is: Inpatient Remains inpatient appropriate because: Delirium hypoxia hypercapnia Consultants: none  Procedures: none Antimicrobials: doxy and maxipime   Subjective:  Did not use bipap overnight  objective: Vitals:   04/17/22 0952 04/17/22 1456 04/17/22 2149 04/18/22 0530  BP: 119/60 (!) 122/56 (!) 133/59 (!) 128/59  Pulse: 72 64 73 64  Resp:  '16 20 18  '$ Temp:  98.3 F (36.8 C) 98.1 F (36.7 C) 97.7 F (36.5 C)  TempSrc:  Oral Oral Oral  SpO2:  98% 98% 100%   Weight:      Height:        Intake/Output Summary (Last 24 hours) at 04/18/2022 1308 Last data filed at 04/18/2022 0600 Gross per 24 hour  Intake 930 ml  Output 920 ml  Net 10 ml    Filed Weights   04/08/22 2046 04/13/22 1635  Weight: (!) 179.9 kg (!) 167.6 kg    Examination:  General exam: Appears chronically ill appearing  Respiratory system: wheezing to auscultation. Respiratory effort normal. Cardiovascular system: S1 & S2 heard, RRR. No JVD, murmurs, rubs, gallops or clicks. No pedal edema. Gastrointestinal system: Abdomen is nondistended, soft and nontender. No organomegaly or masses felt. Normal bowel sounds heard. Central nervous system: Alert and oriented. No focal neurological deficits. Extremities: right thigh inner erythema  Psychiatry: Judgement and insight appear normal. Mood & affect appropriate.     Data Reviewed: I have personally reviewed following labs and imaging studies  CBC: Recent Labs  Lab 04/13/22 0428 04/14/22 0323 04/15/22 0632 04/16/22 0248  WBC 9.4 7.2 5.7 6.5  NEUTROABS 7.7  --   --   --   HGB 12.4 11.7* 12.1 14.9  HCT 41.1 37.6 38.8 47.0*  MCV 106.5* 105.0* 103.5* 102.6*  PLT 251 229 222 [id number]    Basic Metabolic Panel: Recent Labs  Lab 04/12/22 0952 04/13/22 0428 04/14/22 0323 04/15/22 0632 04/16/22 0248  NA 136 136 135 136 140  K 3.7 4.4 3.7 3.4* 3.7  CL 88* 90* 89* 88* 89*  CO2 38* 35* 34* 40* 38*  GLUCOSE 170* 188* 126* 121* 145*  BUN 17 22 34* 33* 36*  CREATININE 0.80 1.29* 1.36* 1.11* 1.17*  CALCIUM 8.8* 8.7* 8.6* 8.6* 9.1  MG  --  1.5* 1.8 1.6*  --     GFR: Estimated Creatinine Clearance: 65.6 mL/min (A) (by C-G formula based on SCr of 1.17 mg/dL (H)). Liver Function Tests: Recent Labs  Lab 04/16/22 0248  AST 16  ALT 19  ALKPHOS 76  BILITOT 0.8  PROT 6.6  ALBUMIN 3.0*    No results for input(s): "LIPASE", "AMYLASE" in the last 168 hours. Recent Labs  Lab 04/15/22 0959  AMMONIA 25    Coagulation  Profile: No results for input(s): "INR", "PROTIME" in the last 168 hours. Cardiac Enzymes: No results for input(s): "CKTOTAL", "CKMB", "CKMBINDEX", "TROPONINI" in the last 168 hours. BNP (last 3 results) No results for input(s): "PROBNP" in the last 8760 hours. HbA1C: No results for input(s): "HGBA1C" in the last 72 hours. CBG: No results for input(s): "GLUCAP" in the last 168 hours. Lipid Profile: No results for input(s): "CHOL", "HDL", "LDLCALC", "TRIG", "CHOLHDL", "LDLDIRECT" in the last 72 hours. Thyroid Function Tests: No results for input(s): "TSH", "T4TOTAL", "FREET4", "T3FREE", "THYROIDAB" in the last 72 hours. Anemia Panel: No results for input(s): "VITAMINB12", "FOLATE", "FERRITIN", "TIBC", "IRON", "RETICCTPCT" in the last 72 hours. Sepsis Labs: No results for input(s): "PROCALCITON", "LATICACIDVEN" in the last 168 hours.  Recent Results (from the past 240 hour(s))  MRSA Next Gen by PCR, Nasal     Status: None   Collection Time: 04/13/22  3:36 PM   Specimen: Nasal Mucosa; Nasal Swab  Result Value Ref Range Status   MRSA by PCR Next Gen NOT DETECTED NOT DETECTED Final    Comment: (NOTE) The GeneXpert MRSA Assay (  FDA approved for NASAL specimens only), is one component of a comprehensive MRSA colonization surveillance program. It is not intended to diagnose MRSA infection nor to guide or monitor treatment for MRSA infections. Test performance is not FDA approved in patients less than 28 years old. Performed at [facility], [place] [address], [place], [place] [address]          Radiology Studies: DG Chest 1 View  Result Date: 04/17/2022 CLINICAL DATA:  Shortness of breath. Lymphedema. EXAM: CHEST  1 VIEW COMPARISON:  04/14/2022 FINDINGS: Similar cardiomegaly. Improving vascular congestion. Subsegmental opacity in the left lung base likely atelectasis. Atelectasis versus trace fluid in the fissure in the right mid lung. No large subpulmonic effusion.  No pneumothorax. IMPRESSION: 1. Improving vascular congestion. 2. Subsegmental opacity in the left lung base likely atelectasis. Electronically Signed   By: Keith Rake M.D.   On: 04/17/2022 17:13        Scheduled Meds:  bisacodyl  10 mg Oral Daily   Chlorhexidine Gluconate Cloth  6 each Topical Daily   vitamin B-12  500 mcg Oral Daily   enoxaparin (LOVENOX) injection  80 mg Subcutaneous Q24H   fluticasone  2 spray Each Nare Daily   furosemide  20 mg Oral Daily   hydrocerin   Topical BID   loratadine  10 mg Oral Daily   metoprolol tartrate  50 mg Oral BID   pantoprazole  40 mg Oral Daily   polyethylene glycol  17 g Oral Daily   Continuous Infusions:  ceFEPime (MAXIPIME) IV 2 g (04/18/22 0831)     LOS: 9 days    Time spent: 39 min  Georgette Shell, MD 04/18/2022, 1:08 PM

## 2022-04-18 NOTE — Progress Notes (Signed)
   NAME:  Allison Short, MRN:  [id number], DOB:  October 22, 1950, LOS: 9 ADMISSION DATE:  04/08/2022, CONSULTATION DATE:  2/23 REFERRING MD:  Zigmund Daniel, CHIEF COMPLAINT:  Hypercapnic respiratory failure    History of Present Illness:  72 y/o female admitted in context of worsening swelling/lymphedema.  Treated for cellulitis and given lasix for edema, noted to have hypoxemia, hypercarbia and pulmonary edema so PCCM consulted for further evaluation.  Pertinent  Medical History  Non-hodgkin's lymphoma Hypertension Obesity Pancreatitis  Significant Hospital Events: Including procedures, antibiotic start and stop dates in addition to other pertinent events   2/16 admitted by hospitalist service, vanc for cellulitis 2/19 doxycycline for cellulitis, vanc off 2/21 CXR > mild LLL infiltrate worrisome for pneumonia, added cefepime 2/22 CXR > pulm edema 2/23 CT head > NAICP, small R mastoid effusion, started BIPAP qHS per pulm consult 2/25 refused BIPAP.   Interim History / Subjective:  No distress   Objective   Blood pressure (Abnormal) 128/59, pulse 64, temperature 97.7 F (36.5 C), temperature source Oral, resp. rate 18, height 5' 2"$  (1.575 m), weight (Abnormal) 167.6 kg, SpO2 100 %.        Intake/Output Summary (Last 24 hours) at 04/18/2022 0924 Last data filed at 04/18/2022 0600 Gross per 24 hour  Intake 930 ml  Output 920 ml  Net 10 ml   Filed Weights   04/08/22 2046 04/13/22 1635  Weight: (Abnormal) 179.9 kg (Abnormal) 167.6 kg    Examination:  General MOF resting in bed. NAD HENT NCAT no JVD neck large Pulm dec'd t/o  Card rrr Abd soft  Ext warm LEs w/ chronic LE changes and are wrapped Neuro awake and oriented.    Resolved Hospital Problem list     Assessment & Plan:  Obesity hypoventilation syndrome with daytime resting hypercarbia Likely OSA Difficulty keeping BIPAP mask in place due to claustrophobia Chronic hypoxemic respiratory failure Sinus congestion Concern for  underlying metabolic acidosis; either due to hypokalemia or contraction alkalosis Non-hodgkin's lymphoma Chronic normocytic anemia Chronic debility HCAP NSTEMI> demand  Plan Continue diamox through tomorrow then reassess volume status/labs Supplemental oxygen Ideally  needs trilogy will get ABG now to see if qualifies and we can order after that   Best Practice (right click and "Reselect all SmartList Selections" daily)   Per primary   Erick Colace ACNP-BC [place] Pager # [phone] OR # [phone] if no answer

## 2022-04-18 NOTE — TOC Progression Note (Signed)
Transition of Care [facility]) - Progression Note    Patient Details  Name: Allison Short MRN: [id number] Date of Birth: 11-13-1950  Transition of Care [facility]) CM/SW [place], RN Phone Number:[phone]  04/18/2022, 10:02 AM  Clinical Narrative:    CM re4cieved message from MD stating the patient is medically ready for discharge. CM has reached out to [facility] the admissions Mudlogger for [place]. Star does not answer. Voicemail has been left.    Expected Discharge Plan: [place] Barriers to Discharge: Continued Medical Work up  Expected Discharge Plan and Services   Discharge Planning Services: CM Consult Post Acute Care Choice: [place] Living arrangements for the past 2 months: Apartment                                       Social Determinants of Health (SDOH) Interventions SDOH Screenings   Food Insecurity: No Food Insecurity (04/08/2022)  Housing: Low Risk  (04/17/2022)  Transportation Needs: No Transportation Needs (04/08/2022)  Utilities: Not At Risk (04/08/2022)  Alcohol Screen: Low Risk  (09/13/2021)  Depression (PHQ2-9): Low Risk  (09/13/2021)  Financial Resource Strain: Low Risk  (09/13/2021)  Physical Activity: Inactive (09/13/2021)  Social Connections: Socially Isolated (09/13/2021)  Stress: No Stress Concern Present (09/13/2021)  Tobacco Use: Low Risk  (04/15/2022)    Readmission Risk Interventions     No data to display

## 2022-04-18 NOTE — TOC Transition Note (Addendum)
Transition of Care [facility]) - CM/SW Discharge Note   Patient Details  Name: Allison Short MRN: [id number] Date of Birth: December 14, 1950  Transition of [facility]) CM/SW Contact:  Angelita Ingles, RN Phone Number:[phone]  04/18/2022, 11:34 AM   Clinical Narrative:    Ronney Lion ([facility] admissions coordinator) has confirmed that patient is ok to discharge to [facility] today. CM attempted to notify son Hilliard Clark. Someone answered phone and states that they will give him a message to call CM back. Transportation has been arranged per PTAR. Discharge packet is at nurses station. Patient has been updated on plan to discharge today.    Please call report to [place] Room # [id number]  [place] arrived to unit to transport patient but there is question about patient needing a bipap. CM spoke with Dr. Zigmund Daniel who states patient has been refusing and will not need it. Per Nurse there is concern from PCCM that patient will require bipap before she can be discharged. CM has been made aware per Dr. Zigmund Daniel that discharge is cancelled for now. Miquel Dunn place made aware. TOC will follow.      Barriers to Discharge: Continued Medical Work up   Patient Goals and CMS Choice CMS Medicare.gov Compare Post Acute Care list provided to:: Patient Choice offered to / list presented to : Patient  Discharge Placement                         Discharge Plan and Services Additional resources added to the After Visit Summary for     Discharge Planning Services: CM Consult Post Acute Care Choice: [place]                               Social Determinants of Health (SDOH) Interventions SDOH Screenings   Food Insecurity: No Food Insecurity (04/08/2022)  Housing: Low Risk  (04/17/2022)  Transportation Needs: No Transportation Needs (04/08/2022)  Utilities: Not At Risk (04/08/2022)  Alcohol Screen: Low Risk  (09/13/2021)  Depression (PHQ2-9): Low Risk  (09/13/2021)  Financial Resource  Strain: Low Risk  (09/13/2021)  Physical Activity: Inactive (09/13/2021)  Social Connections: Socially Isolated (09/13/2021)  Stress: No Stress Concern Present (09/13/2021)  Tobacco Use: Low Risk  (04/15/2022)     Readmission Risk Interventions     No data to display

## 2022-04-19 DIAGNOSIS — I890 Lymphedema, not elsewhere classified: Secondary | ICD-10-CM | POA: Diagnosis not present

## 2022-04-19 DIAGNOSIS — J9621 Acute and chronic respiratory failure with hypoxia: Secondary | ICD-10-CM | POA: Diagnosis not present

## 2022-04-19 DIAGNOSIS — J9622 Acute and chronic respiratory failure with hypercapnia: Secondary | ICD-10-CM | POA: Diagnosis not present

## 2022-04-19 MED ORDER — BISACODYL 10 MG RE SUPP
10.0000 mg | Freq: Once | RECTAL | Status: AC
  Administered 2022-04-19: 10 mg via RECTAL
  Filled 2022-04-19: qty 1

## 2022-04-19 NOTE — Progress Notes (Signed)
Occupational Therapy Treatment Patient Details Name: Allison Short MRN: [id number] DOB: 1950/03/24 Today's Date: 04/19/2022   History of present illness Patient is a 72 year old female who presented  on 04/08/2022 with weeping and draining lymphedema with increased redness and heat. Patient was admitted with cellulitis of lower extremities with chronic lymphedema, hypokalemia, and expiratory wheezing. LE dopplers negative for DVT. PMH including but not limited to: B TKA, HTN, Non-hodgkin's lymphoma, lymphedema, and morbid obesity. (Simultaneous filing. User may not have seen previous data.)   OT comments  Patient was noted to have continued anxiety impacting participation in session with patient educated on strategies to work through anxiousness. Patient was able to engage in grooming tasks sitting EOB. Patient remains +2 for transfers and standing with noted increased anxiousness with attempts at hygiene tasks in standing. Patient's discharge plan remains appropriate at this time. OT will continue to follow acutely.     Recommendations for follow up therapy are one component of a multi-disciplinary discharge planning process, led by the attending physician.  Recommendations may be updated based on patient status, additional functional criteria and insurance authorization.    Follow Up Recommendations  Skilled nursing-short term rehab (<3 hours/day)     Assistance Recommended at Discharge Frequent or constant Supervision/Assistance  Patient can return home with the following  Two people to help with walking and/or transfers;Assistance with cooking/housework;Direct supervision/assist for medications management;Assist for transportation;Help with stairs or ramp for entrance;Direct supervision/assist for financial management;Two people to help with bathing/dressing/bathroom   Equipment Recommendations  None recommended by OT       Precautions / Restrictions Precautions Precautions: Fall Precaution  Comments: Lymphedema in BLEs Restrictions Weight Bearing Restrictions: No       Mobility Bed Mobility Overal bed mobility: Needs Assistance Bed Mobility: Sit to Supine       Sit to supine: +2 for safety/equipment, Total assist             Balance Overall balance assessment: Needs assistance Sitting-balance support: Feet supported Sitting balance-Leahy Scale: Fair     Standing balance support: Reliant on assistive device for balance Standing balance-Leahy Scale: Poor         ADL either performed or assessed with clinical judgement   ADL Overall ADL's : Needs assistance/impaired     Grooming: Minimal assistance;Sitting Grooming Details (indicate cue type and reason): patient was educated extensively on brushing hair each day as task patient could control and participate in with materials in room to maintain self care. patient reported that she needed a comb from home as this one was not able to comb her hair. patient was shown with mod A that this comb could comb through her hair and it was the only one availble at this time. if patient maintained it patient would be able to use this comb with no issues. patient verbalized understanding. Upper Body Bathing: Sitting;Set up Upper Body Bathing Details (indicate cue type and reason): EOB with increased time     Upper Body Dressing : Minimal assistance;Sitting       Toilet Transfer: +2 for safety/equipment;+2 for physical assistance;Moderate assistance;Stand-pivot;Rolling walker (2 wheels) Toilet Transfer Details (indicate cue type and reason): to transfer from [facility] to bed. no bari recliner available at this time.                  Cognition Arousal/Alertness: Awake/alert Behavior During Therapy: WFL for tasks assessed/performed Overall Cognitive Status: Within Functional Limits for tasks assessed  General Comments: patient was anxious with all tasks with cues needed to calm down and talk through  concerns.              General Comments pt reports being itchy, nurse aware, nurse provided medication to address anxiety during therpay session    Pertinent Vitals/ Pain       Pain Assessment Pain Assessment: No/denies pain         Frequency  Min 2X/week        Progress Toward Goals  OT Goals(current goals can now be found in the care plan section)  Progress towards OT goals: Progressing toward goals     Plan Discharge plan remains appropriate    Co-evaluation      Reason for Co-Treatment: For patient/therapist safety;To address functional/ADL transfers (Simultaneous filing. User may not have seen previous data.) PT goals addressed during session: Mobility/safety with mobility (Simultaneous filing. User may not have seen previous data.) OT goals addressed during session: ADL's and self-care (Simultaneous filing. User may not have seen previous data.)      AM-PAC OT "6 Clicks" Daily Activity     Outcome Measure   Help from another person eating meals?: A Little Help from another person taking care of personal grooming?: A Little Help from another person toileting, which includes using toliet, bedpan, or urinal?: A Lot Help from another person bathing (including washing, rinsing, drying)?: A Lot Help from another person to put on and taking off regular upper body clothing?: A Lot Help from another person to put on and taking off regular lower body clothing?: A Lot 6 Click Score: 14    End of Session Equipment Utilized During Treatment: Gait belt;Rolling walker (2 wheels)  OT Visit Diagnosis: Unsteadiness on feet (R26.81);Other abnormalities of gait and mobility (R26.89);Muscle weakness (generalized) (M62.81)   Activity Tolerance Patient tolerated treatment well   Patient Left in bed;with call bell/phone within reach;with bed alarm set   Nurse Communication Mobility status        Time: [id number] OT Time Calculation (min): 42 min  Charges: OT General  Charges $OT Visit: 1 Visit OT Treatments $Self Care/Home Management : 23-37 mins  Rennie Plowman, MS Acute Rehabilitation Department Office# [phone]   Willa Rough 04/19/2022, 3:42 PM

## 2022-04-19 NOTE — TOC Progression Note (Addendum)
Transition of Care [facility]) - Progression Note    Patient Details  Name: Allison Short MRN: [id number] Date of Birth: Apr 06, 1950  Transition of Care [facility]) CM/SW Contact  Corrado Hymon, [name], RN Phone Number: 04/19/2022, 10:21 AM  Clinical Narrative:   Patient is for d/c to SNF [facility]-I will f/u with the facility & [facility] to confirm who has to order & what order is needed along with additional paperwork.FYI the SNF auth ended yesterday.Per Trinna Post can only manage CPAP brought from home, not able to manage NIPPV.Recommend LTACH cons.MD/team updated. -11a-Please place either TOC cons for LTACH or PT work with patient for ongoing therapy for ST SNF.    Expected Discharge Plan: [place] Barriers to Discharge: No Barriers Identified  Expected Discharge Plan and Services   Discharge Planning Services: CM Consult Post Acute Care Choice: [place] Living arrangements for the past 2 months: Apartment Expected Discharge Date: 04/18/22                                     Social Determinants of Health (SDOH) Interventions SDOH Screenings   Food Insecurity: No Food Insecurity (04/08/2022)  Housing: Low Risk  (04/17/2022)  Transportation Needs: No Transportation Needs (04/08/2022)  Utilities: Not At Risk (04/08/2022)  Alcohol Screen: Low Risk  (09/13/2021)  Depression (PHQ2-9): Low Risk  (09/13/2021)  Financial Resource Strain: Low Risk  (09/13/2021)  Physical Activity: Inactive (09/13/2021)  Social Connections: Socially Isolated (09/13/2021)  Stress: No Stress Concern Present (09/13/2021)  Tobacco Use: Low Risk  (04/15/2022)    Readmission Risk Interventions     No data to display

## 2022-04-19 NOTE — Progress Notes (Signed)
Physical Therapy Treatment Patient Details Name: Allison Short MRN: [id number] DOB: 11-29-1950 Today's Date: 04/19/2022   History of Present Illness Patient is a 72 year old female who presented  on 04/08/2022 with weeping and draining lymphedema with increased redness and heat. Patient was admitted with cellulitis of lower extremities with chronic lymphedema, hypokalemia, and expiratory wheezing. LE dopplers negative for DVT. PMH including but not limited to: B TKA, HTN, Non-hodgkin's lymphoma, lymphedema, and morbid obesity. (Simultaneous filing. User may not have seen previous data.)    PT Comments    Pt admitted with above diagnosis. Pt presented to therapy seated on BSC. Pt required mod A x 2 for STS from [facility] with cues for push to stand and transitioning B UES to RW, min A for static standing up to 2:23 with cues for extension posture, pt able to perform pre-gait tasks at RW with anterior/posterior stepping patterns and weight shifting laterally, pt unable to pivot to return to bed and BSC removed and bed placed behind pt, pt guided through reaching back for EOB and eccentric control to bed. Pt maintained sitting balance with ADL tasks with no evidence of LOB. Pt required max a X 2 for sit to supine. Pt left in bed all needs in place, set up for lunch and nurse aware of need for pure wic to be replaced.  Pt currently with functional limitations due to the deficits listed below (see PT Problem List). Pt will benefit from skilled PT to increase their independence and safety with mobility to allow discharge to the venue listed below.      Recommendations for follow up therapy are one component of a multi-disciplinary discharge planning process, led by the attending physician.  Recommendations may be updated based on patient status, additional functional criteria and insurance authorization.  Follow Up Recommendations  Skilled nursing-short term rehab (<3 hours/day) (pt currenty requires BiPAP) Can  patient physically be transported by private vehicle: No   Assistance Recommended at Discharge Frequent or constant Supervision/Assistance  Patient can return home with the following A lot of help with walking and/or transfers;A lot of help with bathing/dressing/bathroom;Direct supervision/assist for medications management;Assistance with cooking/housework;Assist for transportation;Help with stairs or ramp for entrance   Equipment Recommendations  None recommended by PT (has DME in home setting)    Recommendations for Other Services       Precautions / Restrictions Precautions Precautions: Fall Precaution Comments: Lymphedema in BLEs Restrictions Weight Bearing Restrictions: No     Mobility  Bed Mobility Overal bed mobility: Needs Assistance Bed Mobility: Sit to Supine     Supine to sit: Max assist, +2 for physical assistance          Transfers Overall transfer level: Needs assistance Equipment used: Rolling walker (2 wheels) Transfers: Sit to/from Stand Sit to Stand: Mod assist, +2 physical assistance, +2 safety/equipment                Ambulation/Gait             Pre-gait activities: pt able to perform anteiror and retrograde stepping patterns at RW with min  A and cues with noted decreaed LLE movement and pt was able to side step to [facility] wtih RW, cues and min A     Stairs             Wheelchair Mobility    Modified Rankin (Stroke Patients Only)       Balance Overall balance assessment: Needs assistance Sitting-balance support: Feet supported Sitting balance-Leahy Scale:  Fair     Standing balance support: Reliant on assistive device for balance Standing balance-Leahy Scale: Poor                              Cognition Arousal/Alertness: Awake/alert Behavior During Therapy: WFL for tasks assessed/performed Overall Cognitive Status: Within Functional Limits for tasks assessed                                           Exercises      General Comments General comments (skin integrity, edema, etc.): pt reports being itchy, nurse aware, nurse provided medication to address anxiety during therpay session      Pertinent Vitals/Pain Pain Assessment Pain Assessment: No/denies pain Pain Intervention(s): Monitored during session    Home Living                          Prior Function            PT Goals (current goals can now be found in the care plan section) Acute Rehab PT Goals PT Goal Formulation: With patient Time For Goal Achievement: 04/26/22 Potential to Achieve Goals: Fair Progress towards PT goals: Progressing toward goals (pt is slow to progress toward goals and demonstrates self limiting behaviors)    Frequency    Min 2X/week      PT Plan Current plan remains appropriate    Co-evaluation PT/OT/SLP Co-Evaluation/Treatment: Yes Reason for Co-Treatment: For patient/therapist safety;To address functional/ADL transfers (Simultaneous filing. User may not have seen previous data.) PT goals addressed during session: Mobility/safety with mobility (Simultaneous filing. User may not have seen previous data.) OT goals addressed during session: ADL's and self-care (Simultaneous filing. User may not have seen previous data.)      AM-PAC PT "6 Clicks" Mobility   Outcome Measure  Help needed turning from your back to your side while in a flat bed without using bedrails?: A Lot Help needed moving from lying on your back to sitting on the side of a flat bed without using bedrails?: A Lot Help needed moving to and from a bed to a chair (including a wheelchair)?: A Lot Help needed standing up from a chair using your arms (e.g., wheelchair or bedside chair)?: A Lot Help needed to walk in hospital room?: Total Help needed climbing 3-5 steps with a railing? : Total 6 Click Score: 10    End of Session Equipment Utilized During Treatment: Gait belt Activity Tolerance: Patient  limited by fatigue Patient left: in bed;with call bell/phone within reach Nurse Communication: Mobility status PT Visit Diagnosis: Unsteadiness on feet (R26.81);Other abnormalities of gait and mobility (R26.89);Muscle weakness (generalized) (M62.81);History of falling (Z91.81);Pain     Time: [id number] PT Time Calculation (min) (ACUTE ONLY): 41 min  Charges:  $Therapeutic Activity: 23-37 mins                     Baird Lyons, PT    Adair Patter 04/19/2022, 12:29 PM

## 2022-04-19 NOTE — Progress Notes (Signed)
   NAME:  Allison Short, MRN:  [id number], DOB:  May 18, 1950, LOS: 9 ADMISSION DATE:  04/08/2022, CONSULTATION DATE:  2/23 REFERRING MD:  Zigmund Daniel, CHIEF COMPLAINT:  Hypercapnic respiratory failure    History of Present Illness:  72 y/o female admitted in context of worsening swelling/lymphedema.  Treated for cellulitis and given lasix for edema, noted to have hypoxemia, hypercarbia and pulmonary edema so PCCM consulted for further evaluation.  Pertinent  Medical History  Non-hodgkin's lymphoma Hypertension Obesity Pancreatitis  Significant Hospital Events: Including procedures, antibiotic start and stop dates in addition to other pertinent events   2/16 admitted by hospitalist service, vanc for cellulitis 2/19 doxycycline for cellulitis, vanc off 2/21 CXR > mild LLL infiltrate worrisome for pneumonia, added cefepime 2/22 CXR > pulm edema 2/23 CT head > NAICP, small R mastoid effusion, started BIPAP qHS per pulm consult 2/25 refused BIPAP.  2/27 tolerated NIPPV all night  Interim History / Subjective:  No distress.   Objective   Blood pressure (Abnormal) 128/59, pulse 64, temperature 97.7 F (36.5 C), temperature source Oral, resp. rate 18, height 5' 2"$  (1.575 m), weight (Abnormal) 167.6 kg, SpO2 100 %.        Intake/Output Summary (Last 24 hours) at 04/18/2022 [id number] Last data filed at 04/18/2022 0600 Gross per 24 hour  Intake 930 ml  Output 920 ml  Net 10 ml   Filed Weights   04/08/22 2046 04/13/22 1635  Weight: (Abnormal) 179.9 kg (Abnormal) 167.6 kg    Examination:  General resting in bed no distress HENT NCAT no JVD Pulm dec bases Card rrr Abd soft Ext ch LE changes Neuro drowsy this am but oriented    Resolved Hospital Problem list     Assessment & Plan:  Obesity hypoventilation syndrome with daytime resting hypercarbia Likely OSA Difficulty keeping BIPAP mask in place due to claustrophobia Chronic hypoxemic respiratory failure Sinus congestion Concern for  underlying metabolic acidosis; either due to hypokalemia or contraction alkalosis Non-hodgkin's lymphoma Chronic normocytic anemia Chronic debility HCAP NSTEMI> demand   Discussion ABG    Component Value Date/Time   PHART 7.44 04/18/2022 1321   PCO2ART 57 (H) 04/18/2022 1321   PO2ART 92 04/18/2022 1321   HCO3 38.7 (H) 04/18/2022 1321   TCO2 34 (H) 04/08/2022 1852   O2SAT 99.1 04/18/2022 1321    This patient requires non-invasive ventilator for acute in chronic respiratory failure and COPD. Without ventilator, there is significant risk of untimely readmission to the hospital and increase on CO2 retention. BIPAP is ineffective in the home setting in reducing CO2 due to the lack of an auto-rate.     Plan Continue diamox through tomorrow then reassess volume status/labs Supplemental oxygen Qualifies for NIPPV based on her blood gas, will see if we can get auto-titration device ordred   Best Practice (right click and "Reselect all SmartList Selections" daily)   Per primary   Erick Colace ACNP-BC [place] Pager # [phone] OR # [phone] if no answer

## 2022-04-19 NOTE — Progress Notes (Signed)
PROGRESS NOTE    Allison Short  [id number] DOB: [birth year] DOA: 04/08/2022 PCP: Dorothyann Peng, NP   Brief Narrative:  72 year old female history of hypertension, NHL, lymphedema presented to the ED with complaints of worsening lymphedema, redness which seem to be worsening with some erythema and heat as well as increased weeping and draining.  Patient states ongoing drainage despite using lymphedema pump and compression hose.  Patient unable to wear closed due to excessive drainage.  Patient presented to the ED, lower extremity Dopplers done negative for DVT.  Patient admitted due to concerns for probable lower extremity cellulitis.    Assessment & Plan:   Principal Problem:   Cellulitis of lower extremity with chronic  lymphedema Active Problems:   Hypokalemia   Essential hypertension   Normocytic anemia   Non Hodgkin's lymphoma (HCC)   Morbid obesity (HCC)   Lymphedema   Acute on chronic respiratory failure with hypoxia and hypercapnia (HCC)   Delirium due to multiple etiologies, acute, hyperactive   Sleep disorder breathing   #1 right lower extremity cellulitis in the setting of chronic bilateral lymphedema-patient admitted with erythema edema and drainage from the right lower extremity.  Dopplers of the lower extremity negative for DVT.  Initially treated with vancomycin then changed to doxycycline.  Seen by PT recommending SNF.  She finished a course of doxycycline. Patient receiving as needed Lasix.  #2 AKI creatinine improved with diuresis.  Continue to hold ACE inhibitor and HCTZ.  Continue Lasix.  And monitor creatinine.    # 3  Acute hypoxic hypercapnic respiratory failure/obesity hypoventilation syndrome-patient had increased oxygen requirement with complaints of shortness of breath.  Chest x-ray showed right basilar infiltrate on 04/13/2022 follow-up chest x-ray on 04/14/2022 showed pulmonary edema. She was started on cefepime on 04/13/2022 She was given as needed  Lasix Titrate oxygen down to keep her sats above 90% She probably has undiagnosed sleep apnea  ABG 04/18/2022 -7.4 4/57/92 PCCM working on getting her on NIV for SNF.  Per case management [facility] cannot do NIV.  We may have to look for SNF that can accommodate her needs.  #4 delirium /confusion-resolved.  This was probably multifactorial.  Prior workup included TSH B12 folate within normal limits.  Normal ammonia level.  CT head no acute findings  #4 hypomagnesemia repleted.   #5 history of hypertension -Lasix PRN and Lopressor #6 history of non-Hodgkin's lymphoma outpatient follow-up  #7 history of chronic normocytic anemia stable  #8 morbid obesity close outpatient follow-up with PCP and lifestyle modification  #9 debility seen by PT and OT recommending skilled nursing facility placement   Estimated body mass index is 67.58 kg/m as calculated from the following:   Height as of this encounter: '5\' 2"'$  (1.575 m).   Weight as of this encounter: 167.6 kg.  DVT prophylaxis: lovenox Code Status: full Family Communication: Discussed with son Ex-husband Cheyla Rea contact information [phone] Disposition Plan:  Status is: Inpatient Remains inpatient appropriate because: Delirium hypoxia hypercapnia Consultants: none  Procedures: none Antimicrobials: doxy and maxipime   Subjective:  She used BiPAP most of the night much more awake alert and appropriate  objective: Vitals:   04/19/22 0414 04/19/22 0512 04/19/22 1226 04/19/22 1226  BP:  (!) 130/54 (!) 144/67 (!) 144/67  Pulse: 70 61 63 63  Resp: 18   18  Temp:  98.8 F (37.1 C) 98.2 F (36.8 C) 98.2 F (36.8 C)  TempSrc:  Oral Oral Oral  SpO2: 99% 99% 96% 96%  Weight:  Height:        Intake/Output Summary (Last 24 hours) at 04/19/2022 1421 Last data filed at 04/19/2022 [id number] Gross per 24 hour  Intake 1010 ml  Output 1150 ml  Net -140 ml    Filed Weights   04/08/22 2046 04/13/22 1635  Weight: (!) 179.9 kg (!)  167.6 kg    Examination:  General exam: Appears chronically ill appearing  Respiratory system: wheezing to auscultation. Respiratory effort normal. Cardiovascular system: S1 & S2 heard, RRR. No JVD, murmurs, rubs, gallops or clicks. No pedal edema. Gastrointestinal system: Abdomen is nondistended, soft and nontender. No organomegaly or masses felt. Normal bowel sounds heard. Central nervous system: Alert and oriented. No focal neurological deficits. Extremities: right thigh inner erythema  Psychiatry: Judgement and insight appear normal. Mood & affect appropriate.     Data Reviewed: I have personally reviewed following labs and imaging studies  CBC: Recent Labs  Lab 04/13/22 0428 04/14/22 0323 04/15/22 0632 04/16/22 0248  WBC 9.4 7.2 5.7 6.5  NEUTROABS 7.7  --   --   --   HGB 12.4 11.7* 12.1 14.9  HCT 41.1 37.6 38.8 47.0*  MCV 106.5* 105.0* 103.5* 102.6*  PLT 251 229 222 [id number]    Basic Metabolic Panel: Recent Labs  Lab 04/13/22 0428 04/14/22 0323 04/15/22 0632 04/16/22 0248  NA 136 135 136 140  K 4.4 3.7 3.4* 3.7  CL 90* 89* 88* 89*  CO2 35* 34* 40* 38*  GLUCOSE 188* 126* 121* 145*  BUN 22 34* 33* 36*  CREATININE 1.29* 1.36* 1.11* 1.17*  CALCIUM 8.7* 8.6* 8.6* 9.1  MG 1.5* 1.8 1.6*  --     GFR: Estimated Creatinine Clearance: 65.6 mL/min (A) (by C-G formula based on SCr of 1.17 mg/dL (H)). Liver Function Tests: Recent Labs  Lab 04/16/22 0248  AST 16  ALT 19  ALKPHOS 76  BILITOT 0.8  PROT 6.6  ALBUMIN 3.0*    No results for input(s): "LIPASE", "AMYLASE" in the last 168 hours. Recent Labs  Lab 04/15/22 0959  AMMONIA 25    Coagulation Profile: No results for input(s): "INR", "PROTIME" in the last 168 hours. Cardiac Enzymes: No results for input(s): "CKTOTAL", "CKMB", "CKMBINDEX", "TROPONINI" in the last 168 hours. BNP (last 3 results) No results for input(s): "PROBNP" in the last 8760 hours. HbA1C: No results for input(s): "HGBA1C" in the last  72 hours. CBG: No results for input(s): "GLUCAP" in the last 168 hours. Lipid Profile: No results for input(s): "CHOL", "HDL", "LDLCALC", "TRIG", "CHOLHDL", "LDLDIRECT" in the last 72 hours. Thyroid Function Tests: No results for input(s): "TSH", "T4TOTAL", "FREET4", "T3FREE", "THYROIDAB" in the last 72 hours. Anemia Panel: No results for input(s): "VITAMINB12", "FOLATE", "FERRITIN", "TIBC", "IRON", "RETICCTPCT" in the last 72 hours. Sepsis Labs: No results for input(s): "PROCALCITON", "LATICACIDVEN" in the last 168 hours.  Recent Results (from the past 240 hour(s))  MRSA Next Gen by PCR, Nasal     Status: None   Collection Time: 04/13/22  3:36 PM   Specimen: Nasal Mucosa; Nasal Swab  Result Value Ref Range Status   MRSA by PCR Next Gen NOT DETECTED NOT DETECTED Final    Comment: (NOTE) The GeneXpert MRSA Assay (FDA approved for NASAL specimens only), is one component of a comprehensive MRSA colonization surveillance program. It is not intended to diagnose MRSA infection nor to guide or monitor treatment for MRSA infections. Test performance is not FDA approved in patients less than 40 years old. Performed at [facility],  [place] [address], [place], [place] [address]          Radiology Studies: No results found.      Scheduled Meds:  bisacodyl  10 mg Oral Daily   vitamin B-12  500 mcg Oral Daily   enoxaparin (LOVENOX) injection  80 mg Subcutaneous Q24H   fluticasone  2 spray Each Nare Daily   furosemide  20 mg Oral Daily   hydrocerin   Topical BID   loratadine  10 mg Oral Daily   metoprolol tartrate  50 mg Oral BID   pantoprazole  40 mg Oral Daily   polyethylene glycol  17 g Oral Daily   Continuous Infusions:    LOS: 10 days    Time spent: 39 min  Georgette Shell, MD 04/19/2022, 2:21 PM

## 2022-04-20 ENCOUNTER — Telehealth: Payer: Self-pay | Admitting: Acute Care

## 2022-04-20 DIAGNOSIS — E876 Hypokalemia: Secondary | ICD-10-CM | POA: Diagnosis not present

## 2022-04-20 DIAGNOSIS — L03115 Cellulitis of right lower limb: Secondary | ICD-10-CM | POA: Diagnosis not present

## 2022-04-20 DIAGNOSIS — I890 Lymphedema, not elsewhere classified: Secondary | ICD-10-CM | POA: Diagnosis not present

## 2022-04-20 DIAGNOSIS — I10 Essential (primary) hypertension: Secondary | ICD-10-CM | POA: Diagnosis not present

## 2022-04-20 DIAGNOSIS — G4730 Sleep apnea, unspecified: Secondary | ICD-10-CM | POA: Diagnosis not present

## 2022-04-20 DIAGNOSIS — F05 Delirium due to known physiological condition: Secondary | ICD-10-CM

## 2022-04-20 LAB — MAGNESIUM: Magnesium: 2 mg/dL (ref 1.7–2.4)

## 2022-04-20 LAB — BASIC METABOLIC PANEL
Anion gap: 7 (ref 5–15)
BUN: 28 mg/dL — ABNORMAL HIGH (ref 8–23)
CO2: 31 mmol/L (ref 22–32)
Calcium: 8.8 mg/dL — ABNORMAL LOW (ref 8.9–10.3)
Chloride: 100 mmol/L (ref 98–111)
Creatinine, Ser: 0.84 mg/dL (ref 0.44–1.00)
GFR, Estimated: >60 mL/min (ref >60)
Glucose, Bld: 178 mg/dL — ABNORMAL HIGH (ref 70–99)
Potassium: 3.8 mmol/L (ref 3.5–5.1)
Sodium: 138 mmol/L (ref 135–145)

## 2022-04-20 LAB — CBC
HCT: 38.3 % (ref 36.0–46.0)
Hemoglobin: 12 g/dL (ref 12.0–15.0)
MCH: 32.8 pg (ref 26.0–34.0)
MCHC: 31.3 g/dL (ref 30.0–36.0)
MCV: 104.6 fL — ABNORMAL HIGH (ref 80.0–100.0)
Platelets: 208 10*3/uL (ref 150–400)
RBC: 3.66 MIL/uL — ABNORMAL LOW (ref 3.87–5.11)
RDW: 14.3 % (ref 11.5–15.5)
WBC: 6.5 10*3/uL (ref 4.0–10.5)
nRBC: 0 % (ref 0.0–0.2)

## 2022-04-20 MED ORDER — ORAL CARE MOUTH RINSE: 15.0000 mL | OROMUCOSAL | Status: DC | PRN

## 2022-04-20 MED ORDER — ALUM & MAG HYDROXIDE-SIMETH 200-200-20 MG/5ML PO SUSP
15.0000 mL | Freq: Four times a day (QID) | ORAL | Status: DC | PRN
  Administered 2022-04-20: 15 mL via ORAL
  Filled 2022-04-20: qty 30

## 2022-04-20 NOTE — Progress Notes (Signed)
Occupational Therapy Treatment Patient Details Name: Allison Short MRN: [id number] DOB: 1950/12/18 Today's Date: 04/20/2022   History of present illness Patient is a 72 year old female who presented  on 04/08/2022 with weeping and draining lymphedema with increased redness and heat. Patient was admitted with cellulitis of lower extremities with chronic lymphedema, hypokalemia, and expiratory wheezing. LE dopplers negative for DVT. PMH including but not limited to: B TKA, HTN, Non-hodgkin's lymphoma, lymphedema, and morbid obesity.   OT comments  Patient was noted to be less anxious during session. Patient was able to engage in transfer from edge of bed to recliner in room. Patient was educated on keeping BLE elevated on bed to maintain edema management. Patient was TD to position in this but reported increased comfort sitting up with BLE on bed. Patient was able to use one hand support in standing with RW to attempt to participate in hygiene tasks. Patient's discharge plan remains appropriate at this time. OT will continue to follow acutely.     Recommendations for follow up therapy are one component of a multi-disciplinary discharge planning process, led by the attending physician.  Recommendations may be updated based on patient status, additional functional criteria and insurance authorization.    Follow Up Recommendations  Skilled nursing-short term rehab (<3 hours/day)     Assistance Recommended at Discharge Frequent or constant Supervision/Assistance  Patient can return home with the following  Two people to help with walking and/or transfers;Assistance with cooking/housework;Direct supervision/assist for medications management;Assist for transportation;Help with stairs or ramp for entrance;Direct supervision/assist for financial management;Two people to help with bathing/dressing/bathroom   Equipment Recommendations  None recommended by OT    Recommendations for Other Services       Precautions / Restrictions Precautions Precautions: Fall Precaution Comments: Lymphedema in BLEs Restrictions Weight Bearing Restrictions: No       Mobility Bed Mobility Overal bed mobility: Needs Assistance Bed Mobility: Sit to Supine     Supine to sit: Mod assist, +2 for safety/equipment          Transfers                         Balance Overall balance assessment: Needs assistance Sitting-balance support: Feet supported Sitting balance-Leahy Scale: Fair     Standing balance support: During functional activity, Single extremity supported Standing balance-Leahy Scale: Fair                             ADL either performed or assessed with clinical judgement   ADL Overall ADL's : Needs assistance/impaired         Upper Body Bathing: Sitting;Set up Upper Body Bathing Details (indicate cue type and reason): EOB with increased time             Toilet Transfer: +2 for safety/equipment;+2 for physical assistance;Stand-pivot;Rolling walker (2 wheels);Minimal assistance Toilet Transfer Details (indicate cue type and reason): to transfer to recliner in room with increased time. patient was min A +2 to stand and min guard to turn to recliner in room. Toileting- Clothing Manipulation and Hygiene: Moderate assistance;Sit to/from stand Toileting - Clothing Manipulation Details (indicate cue type and reason): with one hand on RW with cues for deep breathign to remain calm during attempt to wash bottom.            Extremity/Trunk Assessment              Vision  Perception     Praxis      Cognition Arousal/Alertness: Awake/alert Behavior During Therapy: WFL for tasks assessed/performed Overall Cognitive Status: Within Functional Limits for tasks assessed                                 General Comments: patient was less anxious today. still needs cues to talk through plan for session and anxiousness         Exercises      Shoulder Instructions       General Comments Patient was educated on participation in time out of bed with BLE elevated on bed and bottom in standard recliner built up with pillows. patient verbalized undersrtanding and demosntrated understanding.    Pertinent Vitals/ Pain       Pain Assessment Pain Assessment: No/denies pain  Home Living                                          Prior Functioning/Environment              Frequency  Min 2X/week        Progress Toward Goals  OT Goals(current goals can now be found in the care plan section)  Progress towards OT goals: Progressing toward goals     Plan Discharge plan remains appropriate    Co-evaluation                 AM-PAC OT "6 Clicks" Daily Activity     Outcome Measure   Help from another person eating meals?: A Little Help from another person taking care of personal grooming?: A Little Help from another person toileting, which includes using toliet, bedpan, or urinal?: A Lot Help from another person bathing (including washing, rinsing, drying)?: A Lot Help from another person to put on and taking off regular upper body clothing?: A Lot Help from another person to put on and taking off regular lower body clothing?: A Lot 6 Click Score: 14    End of Session Equipment Utilized During Treatment: Gait belt;Rolling walker (2 wheels)  OT Visit Diagnosis: Unsteadiness on feet (R26.81);Other abnormalities of gait and mobility (R26.89);Muscle weakness (generalized) (M62.81)   Activity Tolerance Patient tolerated treatment well   Patient Left in chair;with call bell/phone within reach   Nurse Communication Mobility status        Time: [id number] OT Time Calculation (min): 32 min  Charges: OT General Charges $OT Visit: 1 Visit OT Treatments $Self Care/Home Management : 8-22 mins $Therapeutic Activity: 8-22 mins  Rennie Plowman, MS Acute Rehabilitation  Department Office# [phone]   Willa Rough 04/20/2022, 12:33 PM

## 2022-04-20 NOTE — Telephone Encounter (Signed)
PT already scheduled on 3/19. Nothing further needed.

## 2022-04-20 NOTE — Progress Notes (Signed)
Patient requesting to try BiPAP again. Earlier in the night, she tolerated it for approximately 3 hours. Placed on at previous settings now, per patient request.

## 2022-04-20 NOTE — TOC Progression Note (Addendum)
Transition of Care [facility]) - Progression Note    Patient Details  Name: Allison Short MRN: [id number] Date of Birth: November 14, 1950  Transition of [facility]) CM/SW Contact  Henrietta Dine, RN Phone Number: 04/20/2022, 1:13 PM  Clinical Narrative:    Per previous TOC note, pt's family notified facility of choice will not accept pt w/ NIPPV; previous pulmonary note reflects CPAP not appropriate for pt and deferred to hospitalist; Dr Grandville Silos notified; final disposition TBD.   -[id number]- notified by Dr Grandville Silos pt not appropriate for long term care.  Expected Discharge Plan: [place] Barriers to Discharge: Continued Medical Work up  Expected Discharge Plan and Services   Discharge Planning Services: CM Consult Post Acute Care Choice: [place] Living arrangements for the past 2 months: Apartment Expected Discharge Date: 04/18/22                                     Social Determinants of Health (SDOH) Interventions SDOH Screenings   Food Insecurity: No Food Insecurity (04/08/2022)  Housing: Low Risk  (04/17/2022)  Transportation Needs: No Transportation Needs (04/08/2022)  Utilities: Not At Risk (04/08/2022)  Alcohol Screen: Low Risk  (09/13/2021)  Depression (PHQ2-9): Low Risk  (09/13/2021)  Financial Resource Strain: Low Risk  (09/13/2021)  Physical Activity: Inactive (09/13/2021)  Social Connections: Socially Isolated (09/13/2021)  Stress: No Stress Concern Present (09/13/2021)  Tobacco Use: Low Risk  (04/15/2022)    Readmission Risk Interventions     No data to display

## 2022-04-20 NOTE — Progress Notes (Signed)
Patient not using BiPAP at this time. She complains of "panic" and not tolerating the mask tonight.

## 2022-04-20 NOTE — Progress Notes (Signed)
PROGRESS NOTE    Allison Short  [id number] DOB: [birth year] DOA: 04/08/2022 PCP: Dorothyann Peng, NP    Chief Complaint  Patient presents with   Leg Swelling    Brief Narrative:  Patient 72 year old female history of hypertension, NHL, lymphedema presented to the ED with complaints of worsening lymphedema, redness which seem to be worsening with some erythema and heat as well as increased weeping and draining.  Patient states ongoing drainage despite using lymphedema pump and compression hose.  Patient unable to wear closed due to excessive drainage.  Patient presented to the ED, lower extremity Dopplers done negative for DVT.  Patient admitted due to concerns for probable lower extremity cellulitis.   Assessment & Plan:   Principal Problem:   Cellulitis of lower extremity with chronic  lymphedema Active Problems:   Hypokalemia   Essential hypertension   Normocytic anemia   Non Hodgkin's lymphoma (HCC)   Morbid obesity (HCC)   Lymphedema   Acute on chronic respiratory failure with hypoxia and hypercapnia (HCC)   Delirium due to multiple etiologies, acute, hyperactive   Sleep disorder breathing  #1 cellulitis of lower extremity with chronic lymphedema -Patient with history of bilateral lymphedema presented 3-week history of worsening drainage, erythema, pain to right lower extremity with concerns for cellulitis of the right lower extremity. -Patient admitted. -Lower extremity Dopplers negative for DVT. -Patient noted to have received vancomycin and Lasix on presentation to the ED with a urine output of 1.9 L. -Some improvement with weeping from lower extremities. -Afebrile.  Improvement in leukocytosis.   -Patient received doses of IV Lasix with good urine output.   -Was on IV vancomycin, transition to oral doxycycline and completed a full course of antibiotic treatment.   -Patient assessed by PT OT who are recommending SNF placement.   -Will need outpatient follow-up with the  lymphedema clinic postdischarge.  -Follow.  2.  Hypokalemia/hypomagnesemia -Repleted.  3.  Hypertension -Continue Lopressor, Lasix. -ACE inhibitor HCTZ discontinued due to bout of AKI.  4.  Acute hypoxic hypercapnic respiratory failure/obesity hypoventilation syndrome  -Patient noted early on in the hospitalization to have increased oxygen requirements, complaints of worsening shortness of breath.  -Patient noted to have some expiratory wheezing.  -Patient denies any prior history of tobacco use. -Patient stated has chronic postnasal drip leading to her cough and likely they are wheezing. -Patient initially was on Claritin, Afrin nasal twice daily as well as as needed [place] nasal spray and scheduled DuoNebs.  Patient also noted to have received IV Lasix. -Chest x-ray done 04/13/2022 was concerning for right basilar infiltrate however follow-up chest x-ray done 04/14/2022 concerning for pulmonary edema. -Patient received a 5-day course of IV cefepime and was given as needed Lasix. -Concern for undiagnosed sleep apnea. -ABG done 04/16/2022 with a pH of 7.4, pCO2 of 76, pO2 of 83. -Patient seen in consultation by PCCM/pulmonary who felt patient may have undiagnosed sleep apnea, patient started on NIPPV which she seems to be doing well with that. -Patient on Diamox. -PCCM/pulmonary recommending patient needs to be set up for NIPPV for discharge, TOC working on this, patient not an appropriate candidate for CPAP per pulmonary. -Outpatient follow-up with pulmonary.  5.  Normocytic anemia -Patient noted to have low vitamin B12 levels and started on oral vitamin B12 supplementation. -Hemoglobin stable at 12.0. -Repeat labs in the AM.  6.  Non-Hodgkin's lymphoma -Status post chemo/radiation about 10 years ago. -Outpatient follow-up.  7.  Delirium/confusion -Resolved. -CT head done negative for any acute abnormalities. -  TSH, B12, folate within normal limits.  Ammonia level within normal  limits.  8.  Acute kidney injury -Renal function noted to have improved with diuresis.  ACE inhibitor and HCTZ on hold. -Repleted.  9.  Morbid obesity -Lifestyle modification.   -Outpatient follow-up with PCP.    10.  Debility -Patient seen by PT/OT, with the evaluation patient felt not to be at baseline, patient lives alone and felt unsafe for patient to be discharged home.  SNF recommended. -TOC working on SNF placement.     DVT prophylaxis: Lovenox Code Status: Full Family Communication: Updated patient.  No family at bedside. Disposition: Patient medically stable.  SNF  Status is: Inpatient The patient will require care spanning > 2 midnights and should be moved to inpatient because: Severity of illness.  Unsafe discharge.   Consultants:  [place], RN Melody Austin 04/11/2022 PCCM: Dr.Olalere 04/15/2022  Procedures: Lower extremity Dopplers 04/08/2022 CT head 04/15/2022 Chest x-ray 04/13/2022, 04/14/2022, 04/17/2022  Antimicrobials:  IV vancomycin 04/08/2022>>>> 04/11/2022 Oral doxycycline 04/11/2022>>>> 04/15/2022 IV cefepime 04/13/2022>>>>> 04/18/2022   Subjective: Patient sitting up in chair.  Denies any chest pain.  Denies any significant shortness of breath.  Stated yesterday was on the BiPAP, had a slight panic attack as she had to go to the restroom to have a bowel movement and after having bowel movement she was able to ask for the BiPAP back again which she tolerated overnight.  No abdominal pain.  Tolerating current diet.  Overall feeling much better.   Objective: Vitals:   04/20/22 0600 04/20/22 0803 04/20/22 1254 04/20/22 1556  BP:  (!) 140/60 (!) 172/70 (!) 146/65  Pulse:  61 (!) 59 64  Resp: '19 18 18   '$ Temp:  98.7 F (37.1 C) 97.9 F (36.6 C)   TempSrc:  Oral Oral   SpO2:  97% 98% 100%  Weight:      Height:        Intake/Output Summary (Last 24 hours) at 04/20/2022 1633 Last data filed at 04/20/2022 1255 Gross per 24 hour  Intake 290 ml  Output 750 ml  Net  -460 ml   Filed Weights   04/08/22 2046 04/13/22 1635  Weight: (!) 179.9 kg (!) 167.6 kg    Examination:  General exam: No acute distress. Respiratory system: CTAB.  No wheezes, no crackles, no rhonchi.  Fair air movement.  Speaking in full sentences.   Cardiovascular system: RRR no murmurs rubs or gallops.  No JVD.  Lower extremities wrapped, improving bilateral lower extremity edema.  Gastrointestinal system: Abdomen is soft, obese, nontender, nondistended, positive bowel sounds.  No rebound.  No guarding.   Central nervous system: Alert and oriented. No focal neurological deficits. Extremities: Bilateral lower extremity lymphedema with decreased weeping, decreased erythema, decreased warmth, decreased tenderness to palpation R > L Skin: Bilateral lower extremity with lymphedema, decreased erythema, decreased warmth, decreased tenderness to palpation.  Bilateral lower extremities wrapped with Kerlix and gauze and bandage.Marland Kitchen Psychiatry: Judgement and insight appear normal. Mood & affect appropriate.     Data Reviewed: I have personally reviewed following labs and imaging studies  CBC: Recent Labs  Lab 04/14/22 0323 04/15/22 0632 04/16/22 0248 04/20/22 0929  WBC 7.2 5.7 6.5 6.5  HGB 11.7* 12.1 14.9 12.0  HCT 37.6 38.8 47.0* 38.3  MCV 105.0* 103.5* 102.6* 104.6*  PLT 229 222 222 [id number]    Basic Metabolic Panel: Recent Labs  Lab 04/14/22 0323 04/15/22 0632 04/16/22 0248 04/20/22 0929  NA 135 136 140 138  K 3.7 3.4* 3.7 3.8  CL 89* 88* 89* 100  CO2 34* 40* 38* 31  GLUCOSE 126* 121* 145* 178*  BUN 34* 33* 36* 28*  CREATININE 1.36* 1.11* 1.17* 0.84  CALCIUM 8.6* 8.6* 9.1 8.8*  MG 1.8 1.6*  --  2.0    GFR: Estimated Creatinine Clearance: 91.4 mL/min (by C-G formula based on SCr of 0.84 mg/dL).  Liver Function Tests: Recent Labs  Lab 04/16/22 0248  AST 16  ALT 19  ALKPHOS 76  BILITOT 0.8  PROT 6.6  ALBUMIN 3.0*    CBG: No results for input(s): "GLUCAP" in  the last 168 hours.   Recent Results (from the past 240 hour(s))  MRSA Next Gen by PCR, Nasal     Status: None   Collection Time: 04/13/22  3:36 PM   Specimen: Nasal Mucosa; Nasal Swab  Result Value Ref Range Status   MRSA by PCR Next Gen NOT DETECTED NOT DETECTED Final    Comment: (NOTE) The GeneXpert MRSA Assay (FDA approved for NASAL specimens only), is one component of a comprehensive MRSA colonization surveillance program. It is not intended to diagnose MRSA infection nor to guide or monitor treatment for MRSA infections. Test performance is not FDA approved in patients less than 35 years old. Performed at [facility], [place] [address] [place], [place] [address]          Radiology Studies: No results found.      Scheduled Meds:  bisacodyl  10 mg Oral Daily   vitamin B-12  500 mcg Oral Daily   enoxaparin (LOVENOX) injection  80 mg Subcutaneous Q24H   fluticasone  2 spray Each Nare Daily   furosemide  20 mg Oral Daily   hydrocerin   Topical BID   loratadine  10 mg Oral Daily   metoprolol tartrate  50 mg Oral BID   pantoprazole  40 mg Oral Daily   polyethylene glycol  17 g Oral Daily   Continuous Infusions:     LOS: 11 days    Time spent: 40 minutes    Irine Seal, MD [facility]   To contact the attending provider between 7A-7P or the covering provider during after hours 7P-7A, please log into the web site [email or web address] and access using universal [place] password for that web site. If you do not have the password, please call the hospital operator.  04/20/2022, 4:33 PM

## 2022-04-20 NOTE — TOC Progression Note (Addendum)
Transition of Care [facility]) - Progression Note    Patient Details  Name: Allison Short MRN: [id number] Date of Birth: 1950/10/19  Transition of Care [facility]) CM/SW Contact  Latecia Miler, [name], RN Phone Number: 04/20/2022, 10:00 AM  Clinical Narrative:Spoke to Rick-ex-spouse on contact list;unable to reach Sean(son)-left vm on home tel#.Informed Rick of current TOC update-[facility] unable to accept with Bipap/NIPPV;& if CPAP(must be brought in from home)also noted patient is claustraphobic. Ronney Lion P-auth has ended 2/26-will need new auth if d/c plan [facility]. Per [employer]) [employer] as primary contact.    Expected Discharge Plan: [place] Barriers to Discharge: Continued Medical Work up  Expected Discharge Plan and Services   Discharge Planning Services: CM Consult Post Acute Care Choice: [place] Living arrangements for the past 2 months: Apartment Expected Discharge Date: 04/18/22                                     Social Determinants of Health (SDOH) Interventions SDOH Screenings   Food Insecurity: No Food Insecurity (04/08/2022)  Housing: Low Risk  (04/17/2022)  Transportation Needs: No Transportation Needs (04/08/2022)  Utilities: Not At Risk (04/08/2022)  Alcohol Screen: Low Risk  (09/13/2021)  Depression (PHQ2-9): Low Risk  (09/13/2021)  Financial Resource Strain: Low Risk  (09/13/2021)  Physical Activity: Inactive (09/13/2021)  Social Connections: Socially Isolated (09/13/2021)  Stress: No Stress Concern Present (09/13/2021)  Tobacco Use: Low Risk  (04/15/2022)    Readmission Risk Interventions     No data to display

## 2022-04-20 NOTE — Progress Notes (Signed)
   NAME:  Allison Short, MRN:  [id number], DOB:  Mar 16, 1950, LOS: 40 ADMISSION DATE:  04/08/2022, CONSULTATION DATE:  2/23 REFERRING MD:  Zigmund Daniel, CHIEF COMPLAINT:  Hypercapnic respiratory failure    History of Present Illness:  71 y/o female admitted in context of worsening swelling/lymphedema.  Treated for cellulitis and given lasix for edema, noted to have hypoxemia, hypercarbia and pulmonary edema so PCCM consulted for further evaluation.  Pertinent  Medical History  Non-hodgkin's lymphoma Hypertension Obesity Pancreatitis  Significant Hospital Events: Including procedures, antibiotic start and stop dates in addition to other pertinent events   2/16 admitted by hospitalist service, vanc for cellulitis 2/19 doxycycline for cellulitis, vanc off 2/21 CXR > mild LLL infiltrate worrisome for pneumonia, added cefepime 2/22 CXR > pulm edema 2/23 CT head > NAICP, small R mastoid effusion, started BIPAP qHS per pulm consult 2/25 refused BIPAP.  2/27 tolerated NIPPV all night  Interim History / Subjective:  No distress.   Objective   Blood pressure (Abnormal) 140/60, pulse 61, temperature 98.7 F (37.1 C), temperature source Oral, resp. rate 18, height 5' 2"$  (1.575 m), weight (Abnormal) 167.6 kg, SpO2 97 %.    FiO2 (%):  [30 %] 30 %   Intake/Output Summary (Last 24 hours) at 04/20/2022 0943 Last data filed at 04/20/2022 0300 Gross per 24 hour  Intake 50 ml  Output 1250 ml  Net -1200 ml   Filed Weights   04/08/22 2046 04/13/22 1635  Weight: (Abnormal) 179.9 kg (Abnormal) 167.6 kg    Examination:  No: 72 year old female patient resting in bed no acute distress HEENT normocephalic atraumatic no  jugular venous distention appreciated. Pulmonary: Diminished bases no accessory use Cardiac: Regular rate and rhythm no murmur rub or gallop Extremities: Chronic venous stasis changes with lymphedema Abdomen obese soft nontender Neuro: Awake oriented  Resolved Hospital Problem list      Assessment & Plan:  Obesity hypoventilation syndrome with daytime resting hypercarbia Likely OSA Difficulty keeping BIPAP mask in place due to claustrophobia Chronic hypoxemic respiratory failure Sinus congestion Concern for underlying metabolic acidosis; either due to hypokalemia or contraction alkalosis Non-hodgkin's lymphoma Chronic normocytic anemia Chronic debility HCAP NSTEMI> demand   Discussion Doing well with NIPPV.  Did have a brief "panic attack" last night, turned out that was because she needed to use the restroom.  She actually asked to have the NIPPV placed back on.  At this point really nothing else to offer, while here in the hospital.    Plan Continue diamox through tomorrow then reassess volume status/labs Set up NIPPV for discharge, currently [facility] working on this I will contact office, and get her set up for follow-up with [place] in 1 month Best Practice (right click and "Reselect all SmartList Selections" daily)   Per primary   Erick Colace ACNP-BC [place] Pager # [phone] OR # [phone] if no answer

## 2022-04-21 ENCOUNTER — Other Ambulatory Visit (HOSPITAL_COMMUNITY): Payer: Self-pay

## 2022-04-21 DIAGNOSIS — I10 Essential (primary) hypertension: Secondary | ICD-10-CM | POA: Diagnosis not present

## 2022-04-21 DIAGNOSIS — I890 Lymphedema, not elsewhere classified: Secondary | ICD-10-CM | POA: Diagnosis not present

## 2022-04-21 DIAGNOSIS — E876 Hypokalemia: Secondary | ICD-10-CM | POA: Diagnosis not present

## 2022-04-21 DIAGNOSIS — L03115 Cellulitis of right lower limb: Secondary | ICD-10-CM | POA: Diagnosis not present

## 2022-04-21 NOTE — NC FL2 (Signed)
[place] Level LEVEL OF CARE FORM     IDENTIFICATION  Patient Name: Allison Short Birthdate: 1950-09-13 Sex: female Admission Date (Current Location): 04/08/2022  [facility] and [place] Number:  Herbalist and Address:  [facility],  [place], [place]      Provider Number: [id number]  Attending Physician Name and Address:  Eugenie Filler, MD  Relative Name and Phone Number:       Current Level of Care: Hospital Recommended Level of Care: [place] Prior Approval Number:    Date Approved/Denied:   PASRR Number: [id number]  Discharge Plan: SNF    Current Diagnoses: Patient Active Problem List   Diagnosis Date Noted   Sleep disorder breathing 04/18/2022   Acute on chronic respiratory failure with hypoxia and hypercapnia ([place]) 04/15/2022   Delirium due to multiple etiologies, acute, hyperactive 04/15/2022   Lymphedema 04/13/2022   Cellulitis of lower extremity with chronic  lymphedema 04/08/2022   Normocytic anemia 04/08/2022   Cellulitis 08/20/2020   Morbid obesity ([place]) 08/20/2020   Hypokalemia 08/20/2020   Cellulitis of right lower extremity 08/20/2020   AKI (acute kidney injury) ([place]) 08/20/2020   Hypertension    Essential hypertension 07/08/2015   Non Hodgkin's lymphoma ([place]) 02/21/2006    Orientation RESPIRATION BLADDER Height & Weight     Self, Time, Situation, Place  Other (Comment) (BIBAP at night) Continent, External catheter Weight: (!) 369 lb 7.9 oz (167.6 kg) Height:  '5\' 2"'$  (157.5 cm)  BEHAVIORAL SYMPTOMS/MOOD NEUROLOGICAL BOWEL NUTRITION STATUS      Continent Diet (See dc summary)  AMBULATORY STATUS COMMUNICATION OF NEEDS Skin   Extensive Assist Verbally Normal                       Personal Care Assistance Level of Assistance  Bathing, Feeding, Dressing Bathing Assistance: Limited assistance Feeding assistance: Independent Dressing Assistance: Limited assistance Total Care  Assistance: Independent   Functional Limitations Info  Sight, Hearing, Speech Sight Info: Impaired Hearing Info: Adequate Speech Info: Adequate    SPECIAL CARE FACTORS FREQUENCY  PT (By licensed PT), OT (By licensed OT)     PT Frequency: 5x/week OT Frequency: 5x/week            Contractures Contractures Info: Not present    Additional Factors Info  Code Status, Allergies Code Status Info: Full Allergies Info: Ciprofloxin Hcl (Ciprofloxacin), Morphine And Related, Asa (Aspirin), Codeine, Chia Oil, Erythromycin, Nsaids, Penicillins           Current Medications (04/21/2022):  This is the current hospital active medication list Current Facility-Administered Medications  Medication Dose Route Frequency Provider Last Rate Last Admin   acetaminophen (TYLENOL) tablet 650 mg  650 mg Oral Q6H PRN Orma Flaming, MD   650 mg at 04/11/22 1938   Or   acetaminophen (TYLENOL) suppository 650 mg  650 mg Rectal Q6H PRN Orma Flaming, MD       alum & mag hydroxide-simeth (MAALOX/MYLANTA) 200-200-20 MG/5ML suspension 15 mL  15 mL Oral Q6H PRN Eugenie Filler, MD   15 mL at 04/20/22 2055   bisacodyl (DULCOLAX) EC tablet 10 mg  10 mg Oral Daily Georgette Shell, MD   10 mg at 04/21/22 1001   camphor-menthol (SARNA) lotion   Topical PRN Kathryne Eriksson, NP       cyanocobalamin (VITAMIN B12) tablet 500 mcg  500 mcg Oral Daily Orma Flaming, MD   500 mcg at 04/21/22  1001   enoxaparin (LOVENOX) injection 80 mg  80 mg Subcutaneous Q24H Orma Flaming, MD   80 mg at 04/20/22 2056   fluticasone (FLONASE) 50 MCG/ACT nasal spray 2 spray  2 spray Each Nare Daily Olalere, Adewale A, MD   2 spray at 04/21/22 1003   furosemide (LASIX) tablet 20 mg  20 mg Oral Daily Georgette Shell, MD   20 mg at 04/21/22 1001   hydrocerin (EUCERIN) cream   Topical BID Georgette Shell, MD   Given at 04/21/22 1001   HYDROcodone-acetaminophen (NORCO/VICODIN) 5-325 MG per tablet 1-2 tablet  1-2 tablet Oral  Q4H PRN Orma Flaming, MD   2 tablet at 04/15/22 2125   hydrocortisone cream 1 % 1 Application  1 Application Topical TID PRN Raenette Rover, NP   1 Application at [id number]   hydrOXYzine (ATARAX) tablet 25 mg  25 mg Oral TID PRN Eugenie Filler, MD   25 mg at 04/20/22 2239   ipratropium-albuterol (DUONEB) 0.5-2.5 (3) MG/3ML nebulizer solution 3 mL  3 mL Nebulization Q6H PRN Georgette Shell, MD   3 mL at 04/19/22 1844   loratadine (CLARITIN) tablet 10 mg  10 mg Oral Daily Eugenie Filler, MD   10 mg at 04/21/22 1001   metoprolol tartrate (LOPRESSOR) injection 5 mg  5 mg Intravenous Q6H PRN Georgette Shell, MD   5 mg at 04/15/22 1711   metoprolol tartrate (LOPRESSOR) tablet 50 mg  50 mg Oral BID Georgette Shell, MD   50 mg at 04/21/22 1001   ondansetron (ZOFRAN) tablet 4 mg  4 mg Oral Q6H PRN Orma Flaming, MD   4 mg at 04/12/22 1351   Or   ondansetron (ZOFRAN) injection 4 mg  4 mg Intravenous Q6H PRN Orma Flaming, MD       Oral care mouth rinse  15 mL Mouth Rinse PRN Eugenie Filler, MD       pantoprazole (PROTONIX) EC tablet 40 mg  40 mg Oral Daily Eugenie Filler, MD   40 mg at 04/21/22 1001   polyethylene glycol (MIRALAX / GLYCOLAX) packet 17 g  17 g Oral Daily Georgette Shell, MD   17 g at 04/21/22 1001   sodium chloride (OCEAN) 0.65 % nasal spray 1 spray  1 spray Each Nare PRN Orma Flaming, MD   1 spray at 04/13/22 2338     Discharge Medications: Please see discharge summary for a list of discharge medications.  Relevant Imaging Results:  Relevant Lab Results:   Additional Information SSN [id number]  Servando Snare, LCSW

## 2022-04-21 NOTE — Care Management Important Message (Signed)
Important Message  Patient Details IM Letter given. Name: [name] MRN: [id number] Date of Birth: 04/15/1950   Medicare Important Message Given:  Yes     Kerin Salen 04/21/2022, 11:26 AM

## 2022-04-21 NOTE — Progress Notes (Signed)
Physical Therapy Treatment Patient Details Name: Allison Short MRN: [id number] DOB: [birth year] Today's Date: 04/21/2022   History of Present Illness Patient is a 72 year old female who presented  on 04/08/2022 with weeping and draining lymphedema with increased redness and heat. Patient was admitted with cellulitis of lower extremities with chronic lymphedema, hypokalemia, and expiratory wheezing. LE dopplers negative for DVT. PMH including but not limited to: B TKA, HTN, Non-hodgkin's lymphoma, lymphedema, and morbid obesity.    PT Comments    General Comments: Axo x 3 very pleasant Lady whom lives home alone.  Has some anxiety.  She likes to know a head of time "the plan" and requires increased time to process/think through before moving.  Some anxiety and fearful of falling. Assisted OOB to [facility] then to amb required increased time and + 2 assist.  General transfer comment: required increased time as well as forward momentum/weight shift to rise from elevated bed.  Required increased time to partially complete 1/4 pivot turn to [facility].  Pt present with increased anxiety and required increased time to process/motivate self as well as control her anxiety.  same fashion rising off BSC.  Assisted with peri care after BM.  General Gait Details: VERY limited distance of 7 feet with + 2 asisst for safety such that recliner was following.  Distance limited by fatigue/effort. Pt lives home alone and will need ST Rehab at SNF prior to safely retuning.   Recommendations for follow up therapy are one component of a multi-disciplinary discharge planning process, led by the attending physician.  Recommendations may be updated based on patient status, additional functional criteria and insurance authorization.  Follow Up Recommendations  Skilled nursing-short term rehab (<3 hours/day) Can patient physically be transported by private vehicle: No   Assistance Recommended at Discharge Frequent or constant  Supervision/Assistance  Patient can return home with the following A lot of help with walking and/or transfers;A lot of help with bathing/dressing/bathroom;Direct supervision/assist for medications management;Assistance with cooking/housework;Assist for transportation;Help with stairs or ramp for entrance   Equipment Recommendations  None recommended by PT    Recommendations for Other Services       Precautions / Restrictions Precautions Precautions: Fall Precaution Comments: Lymphedema in BLEs Restrictions Weight Bearing Restrictions: No     Mobility  Bed Mobility Overal bed mobility: Needs Assistance Bed Mobility: Supine to Sit     Supine to sit: Mod assist, +2 for safety/equipment     General bed mobility comments: pt required much assist to transfer to EOB    Transfers Overall transfer level: Needs assistance Equipment used: Rolling walker (2 wheels) Transfers: Sit to/from Stand Sit to Stand: Mod assist, +2 physical assistance, +2 safety/equipment           General transfer comment: required increased time as well as forward momentum/weight shift to rise from elevated bed.  Required increased time to partially complete 1/4 pivot turn to [facility].  Pt present with increased anxiety and required increased time to process/motivate self as well as control her anxiety.  same fashion rising off BSC.  Assisted with peri care after BM.    Ambulation/Gait Ambulation/Gait assistance: Min assist, +2 physical assistance, +2 safety/equipment Gait Distance (Feet): 7 Feet Assistive device: Rolling walker (2 wheels) (bariatric) Gait Pattern/deviations: Step-to pattern, Decreased stance time - right, Decreased stride length, Decreased dorsiflexion - right, Shuffle, Wide base of support, Trunk flexed Gait velocity: decreased     General Gait Details: VERY limited distance of 7 feet with + 2 asisst  for safety such that recliner was following.  Distance limited by  fatigue/effort.   Stairs             Wheelchair Mobility    Modified Rankin (Stroke Patients Only)       Balance                                            Cognition Arousal/Alertness: Awake/alert Behavior During Therapy: WFL for tasks assessed/performed Overall Cognitive Status: Within Functional Limits for tasks assessed                                 General Comments: Axo x 3 very pleasant Lady whom lives home alone.  Has some anxiety.  She likes to know a head of time "the plan" and requires increased time to process/think through before moving.  Some anxiety and fearful of falling.        Exercises      General Comments        Pertinent Vitals/Pain Pain Assessment Pain Assessment: Faces Faces Pain Scale: Hurts a little bit Pain Location: BLE R>L with movement Pain Descriptors / Indicators: Discomfort, Heaviness, Guarding Pain Intervention(s): Monitored during session, Repositioned    Home Living                          Prior Function            PT Goals (current goals can now be found in the care plan section) Progress towards PT goals: Progressing toward goals    Frequency    Min 2X/week      PT Plan Current plan remains appropriate    Co-evaluation              AM-PAC PT "6 Clicks" Mobility   Outcome Measure  Help needed turning from your back to your side while in a flat bed without using bedrails?: A Lot Help needed moving from lying on your back to sitting on the side of a flat bed without using bedrails?: A Lot Help needed moving to and from a bed to a chair (including a wheelchair)?: A Lot Help needed standing up from a chair using your arms (e.g., wheelchair or bedside chair)?: A Lot Help needed to walk in hospital room?: A Lot Help needed climbing 3-5 steps with a railing? : Total 6 Click Score: 11    End of Session Equipment Utilized During Treatment: Gait belt Activity  Tolerance: Patient limited by fatigue Patient left: in chair;with call bell/phone within reach Nurse Communication: Mobility status PT Visit Diagnosis: Unsteadiness on feet (R26.81);Other abnormalities of gait and mobility (R26.89);Muscle weakness (generalized) (M62.81);History of falling (Z91.81);Pain Pain - Right/Left: Left Pain - part of body: Leg     Time: 1136-1202 PT Time Calculation (min) (ACUTE ONLY): 26 min  Charges:  $Gait Training: 8-22 mins $Therapeutic Activity: 8-22 mins                     Rica Koyanagi  PTA Acute  Rehabilitation Services Office M-F          [phone] Weekend pager [phone]

## 2022-04-21 NOTE — Progress Notes (Signed)
PROGRESS NOTE    Allison Short  [id number] DOB: 1950/05/09 DOA: 04/08/2022 PCP: Dorothyann Peng, NP    Chief Complaint  Patient presents with   Leg Swelling    Brief Narrative:  Patient 72 year old female history of hypertension, NHL, lymphedema presented to the ED with complaints of worsening lymphedema, redness which seem to be worsening with some erythema and heat as well as increased weeping and draining.  Patient states ongoing drainage despite using lymphedema pump and compression hose.  Patient unable to wear closed due to excessive drainage.  Patient presented to the ED, lower extremity Dopplers done negative for DVT.  Patient admitted due to concerns for probable lower extremity cellulitis.   Assessment & Plan:   Principal Problem:   Cellulitis of lower extremity with chronic  lymphedema Active Problems:   Hypokalemia   Essential hypertension   Normocytic anemia   Non Hodgkin's lymphoma (HCC)   Morbid obesity (HCC)   Lymphedema   Acute on chronic respiratory failure with hypoxia and hypercapnia (HCC)   Delirium due to multiple etiologies, acute, hyperactive   Sleep disorder breathing  #1 cellulitis of lower extremity with chronic lymphedema -Patient with history of bilateral lymphedema presented 3-week history of worsening drainage, erythema, pain to right lower extremity with concerns for cellulitis of the right lower extremity. -Patient admitted. -Lower extremity Dopplers negative for DVT. -Patient noted to have received vancomycin and Lasix on presentation to the ED with a urine output of 1.9 L. -Some improvement with weeping from lower extremities. -Afebrile.  Improvement in leukocytosis.   -Patient received doses of IV Lasix with good urine output.   -Was on IV vancomycin, transitioned to oral doxycycline and completed a full course of antibiotic treatment.   -Patient assessed by PT OT who are recommending SNF placement.   -Will need outpatient follow-up with the  lymphedema clinic postdischarge.  -Follow.  2.  Hypokalemia/hypomagnesemia -Repleted.  3.  Hypertension -Controlled on current regimen of Lopressor, Lasix.   -Was on ACE inhibitor and HCTZ prior to admission which was held due to AKI and likely will not be resumed on discharge.    4.  Acute hypoxic hypercapnic respiratory failure/obesity hypoventilation syndrome  -Patient noted early on in the hospitalization to have increased oxygen requirements, complaints of worsening shortness of breath.  -Patient noted to have some expiratory wheezing during the hospitalization.  -Patient denies any prior history of tobacco use. -Patient stated has chronic postnasal drip leading to her cough and likely they are wheezing. -Patient initially was on Claritin, Afrin nasal twice daily as well as as needed Ocean nasal spray and scheduled DuoNebs.  Patient also noted to have received IV Lasix. -Chest x-ray done 04/13/2022 was concerning for right basilar infiltrate however follow-up chest x-ray done 04/14/2022 concerning for pulmonary edema. -Patient received a 5-day course of IV cefepime and was given as needed Lasix. -Concern for undiagnosed sleep apnea. -ABG done 04/16/2022 with a pH of 7.4, pCO2 of 76, pO2 of 83. -Patient seen in consultation by PCCM/pulmonary who felt patient may have undiagnosed sleep apnea, obesity-hypoventilation syndrome,  patient started on NIPPV which she seems to be doing well with that. -Patient was on Diamox. -PCCM/pulmonary recommending patient needs to be set up for NIPPV for discharge, TOC working on this, patient not an appropriate candidate for CPAP per pulmonary. -Outpatient follow-up with pulmonary.  5.  Normocytic anemia -Patient noted to have low vitamin B12 levels and started on oral vitamin B12 supplementation. -Hemoglobin stable at 12.0. -Repeat labs in  the AM.  6.  Non-Hodgkin's lymphoma -Status post chemo/radiation about 10 years ago. -Outpatient  follow-up.  7.  Delirium/confusion -Resolved. -CT head done negative for any acute abnormalities. -TSH, B12, folate within normal limits.   -Ammonia level within normal limits.  8.  Acute kidney injury -Renal function noted to have improved with diuresis.   ACE inhibitor and HCTZ on hold and likely will not resume on discharge.  9.  Morbid obesity -Lifestyle modification.   -Outpatient follow-up with PCP.    10.  Debility -Patient seen by PT/OT, who are recommending SNF.  Per PT/ OT patient unsafe for discharge home. -TOC consulted for SNF placement.    DVT prophylaxis: Lovenox Code Status: Full Family Communication: Updated patient.  No family at bedside. Disposition: Patient medically stable for SNF  Status is: Inpatient The patient will require care spanning > 2 midnights and should be moved to inpatient because: Severity of illness.  Unsafe discharge.   Consultants:  [place], RN Melody Austin 04/11/2022 PCCM: Dr.Olalere 04/15/2022  Procedures: Lower extremity Dopplers 04/08/2022 CT head 04/15/2022 Chest x-ray 04/13/2022, 04/14/2022, 04/17/2022  Antimicrobials:  IV vancomycin 04/08/2022>>>> 04/11/2022 Oral doxycycline 04/11/2022>>>> 04/15/2022 IV cefepime 04/13/2022>>>>> 04/18/2022   Subjective: Laying in bed.  Overall feeling better after BiPAP started.  Stated last night they could not quite fit BiPAP mask on after patient had a small panic attack and had to take it off and wanted to be placed back on.   Objective: Vitals:   04/21/22 0310 04/21/22 0425 04/21/22 0956 04/21/22 1001  BP:  (!) 142/63 (!) 152/71 (!) 155/74  Pulse: (!) 55 64 61 64  Resp: 13 (!) 21 19   Temp:  98 F (36.7 C) 97.6 F (36.4 C)   TempSrc:  Oral Oral   SpO2: 98% 97%    Weight:      Height:        Intake/Output Summary (Last 24 hours) at 04/21/2022 1114 Last data filed at 04/21/2022 0427 Gross per 24 hour  Intake 480 ml  Output 700 ml  Net -220 ml    Filed Weights   04/08/22 2046 04/13/22  1635  Weight: (!) 179.9 kg (!) 167.6 kg    Examination:  General exam: NAD. Respiratory system: Lungs clear to auscultation bilaterally.  Minimal expiratory wheezes, no crackles, no rhonchi.  Fair air movement.  Speaking in full sentences.   Cardiovascular system: Regular rate rhythm no murmurs rubs or gallops.  No JVD.  Lower extremities wrapped with decreased erythema, decreased edema.   Gastrointestinal system: Abdomen is soft, obese, nontender, nondistended, positive bowel sounds.  No rebound.  No guarding.   Central nervous system: Alert and oriented. No focal neurological deficits. Extremities: Bilateral lower extremity lymphedema with decreased weeping, decreased erythema, decreased warmth, decreased tenderness to palpation R > L Skin: Bilateral lower extremity with lymphedema, decreased erythema, decreased warmth, decreased tenderness to palpation.  Bilateral lower extremities wrapped with Kerlix and gauze and bandage.Marland Kitchen Psychiatry: Judgement and insight appear normal. Mood & affect appropriate.     Data Reviewed: I have personally reviewed following labs and imaging studies  CBC: Recent Labs  Lab 04/15/22 0632 04/16/22 0248 04/20/22 0929  WBC 5.7 6.5 6.5  HGB 12.1 14.9 12.0  HCT 38.8 47.0* 38.3  MCV 103.5* 102.6* 104.6*  PLT 222 222 208     Basic Metabolic Panel: Recent Labs  Lab 04/15/22 0632 04/16/22 0248 04/20/22 0929  NA 136 140 138  K 3.4* 3.7 3.8  CL 88* 89* 100  CO2 40* 38* 31  GLUCOSE 121* 145* 178*  BUN 33* 36* 28*  CREATININE 1.11* 1.17* 0.84  CALCIUM 8.6* 9.1 8.8*  MG 1.6*  --  2.0     GFR: Estimated Creatinine Clearance: 91.4 mL/min (by C-G formula based on SCr of 0.84 mg/dL).  Liver Function Tests: Recent Labs  Lab 04/16/22 0248  AST 16  ALT 19  ALKPHOS 76  BILITOT 0.8  PROT 6.6  ALBUMIN 3.0*     CBG: No results for input(s): "GLUCAP" in the last 168 hours.   Recent Results (from the past 240 hour(s))  MRSA Next Gen by PCR,  Nasal     Status: None   Collection Time: 04/13/22  3:36 PM   Specimen: Nasal Mucosa; Nasal Swab  Result Value Ref Range Status   MRSA by PCR Next Gen NOT DETECTED NOT DETECTED Final    Comment: (NOTE) The GeneXpert MRSA Assay (FDA approved for NASAL specimens only), is one component of a comprehensive MRSA colonization surveillance program. It is not intended to diagnose MRSA infection nor to guide or monitor treatment for MRSA infections. Test performance is not FDA approved in patients less than 19 years old. Performed at [facility], [place] [address], [place],  [address]          Radiology Studies: No results found.      Scheduled Meds:  bisacodyl  10 mg Oral Daily   vitamin B-12  500 mcg Oral Daily   enoxaparin (LOVENOX) injection  80 mg Subcutaneous Q24H   fluticasone  2 spray Each Nare Daily   furosemide  20 mg Oral Daily   hydrocerin   Topical BID   loratadine  10 mg Oral Daily   metoprolol tartrate  50 mg Oral BID   pantoprazole  40 mg Oral Daily   polyethylene glycol  17 g Oral Daily   Continuous Infusions:     LOS: 12 days    Time spent: 40 minutes    Irine Seal, MD [facility]   To contact the attending provider between 7A-7P or the covering provider during after hours 7P-7A, please log into the web site [email or web address] and access using universal [place] password for that web site. If you do not have the password, please call the hospital operator.  04/21/2022, 11:14 AM

## 2022-04-21 NOTE — TOC Progression Note (Addendum)
Transition of Care [facility]) - Progression Note    Patient Details  Name: Allison Short MRN: [id number] Date of Birth: 1950/05/09  Transition of Care [facility]) CM/SW Contact  Servando Snare, [place] Phone Number: 04/21/2022, 11:35 AM  Clinical Narrative:    Spoke with attending to clarify patients needs at dc. TOC faxed patient out to facilities again clarifying need for BIBAP at night. Previous facility would not accept patient back with BIPAP. TOC will present bed offers to family once received.    Expected Discharge Plan: [place] Barriers to Discharge: Continued Medical Work up  Expected Discharge Plan and Services   Discharge Planning Services: CM Consult Post Acute Care Choice: [place] Living arrangements for the past 2 months: Apartment Expected Discharge Date: 04/18/22                                     Social Determinants of Health (SDOH) Interventions SDOH Screenings   Food Insecurity: No Food Insecurity (04/08/2022)  Housing: Low Risk  (04/17/2022)  Transportation Needs: No Transportation Needs (04/08/2022)  Utilities: Not At Risk (04/08/2022)  Alcohol Screen: Low Risk  (09/13/2021)  Depression (PHQ2-9): Low Risk  (09/13/2021)  Financial Resource Strain: Low Risk  (09/13/2021)  Physical Activity: Inactive (09/13/2021)  Social Connections: Socially Isolated (09/13/2021)  Stress: No Stress Concern Present (09/13/2021)  Tobacco Use: Low Risk  (04/15/2022)    Readmission Risk Interventions     No data to display

## 2022-04-21 NOTE — Progress Notes (Signed)
Patient complains of being too anxious to go onto BiPAP at this time. She asks many questions which were answered and education provided where neccessary. Many of her questions were repetitive from previous conversations and clarifications about CO2 and how the BiPAP helps her. She also asks why she is not getting "stronger medicine" to alleviate the anxiety she deals with. Education provided and explanations given as to how many medications interfere with the respiratory system and drive to breath. Patient voices understanding and is willing to go on BiPAP but not until a later time. She has agreed to try around 1am. RT will continue to follow and work around patient needs for further BiPAP success. RN made aware of all patient concerns that I could not answer or requests that I could not meet at this time. Nurse techs made aware of patient request for bath and hair washing in the morning and request she not be bathed tonight so she can calm down, relax and get some sleep.

## 2022-04-21 NOTE — TOC Progression Note (Signed)
Transition of Care [facility]) - Progression Note    Patient Details  Name: Allison Short MRN: [id number] Date of Birth: 13-Jan-1951  Transition of Care [facility]) CM/SW Contact  Aryel Edelen, [name], RN Phone Number: 04/21/2022, 3:53 PM  Clinical Narrative: [facility], & can manage [name] following. Provided list with [facility] to patient-await if agree to [facility] for [place] [facility] w/Bipap.      Expected Discharge Plan: [place] Barriers to Discharge: Continued Medical Work up  Expected Discharge Plan and Services   Discharge Planning Services: CM Consult Post Acute Care Choice: [place] Living arrangements for the past 2 months: Apartment Expected Discharge Date: 04/18/22                                     Social Determinants of Health (SDOH) Interventions SDOH Screenings   Food Insecurity: No Food Insecurity (04/08/2022)  Housing: Low Risk  (04/17/2022)  Transportation Needs: No Transportation Needs (04/08/2022)  Utilities: Not At Risk (04/08/2022)  Alcohol Screen: Low Risk  (09/13/2021)  Depression (PHQ2-9): Low Risk  (09/13/2021)  Financial Resource Strain: Low Risk  (09/13/2021)  Physical Activity: Inactive (09/13/2021)  Social Connections: Socially Isolated (09/13/2021)  Stress: No Stress Concern Present (09/13/2021)  Tobacco Use: Low Risk  (04/15/2022)    Readmission Risk Interventions     No data to display

## 2022-04-22 ENCOUNTER — Other Ambulatory Visit (HOSPITAL_COMMUNITY): Payer: Self-pay

## 2022-04-22 DIAGNOSIS — I890 Lymphedema, not elsewhere classified: Secondary | ICD-10-CM | POA: Diagnosis not present

## 2022-04-22 DIAGNOSIS — L03115 Cellulitis of right lower limb: Secondary | ICD-10-CM | POA: Diagnosis not present

## 2022-04-22 DIAGNOSIS — E876 Hypokalemia: Secondary | ICD-10-CM | POA: Diagnosis not present

## 2022-04-22 DIAGNOSIS — I10 Essential (primary) hypertension: Secondary | ICD-10-CM | POA: Diagnosis not present

## 2022-04-22 LAB — GLUCOSE, CAPILLARY
Glucose-Capillary: 180 mg/dL — ABNORMAL HIGH (ref 70–99)
Glucose-Capillary: 177 mg/dL — ABNORMAL HIGH (ref 70–99)
Glucose-Capillary: 133 mg/dL — ABNORMAL HIGH (ref 70–99)

## 2022-04-22 NOTE — TOC Progression Note (Addendum)
Transition of Care [facility]) - Progression Note    Patient Details  Name: Allison Short MRN: [id number] Date of Birth: [birth year]  Transition of Care [facility]) CM/SW Contact  Henrietta Dine, RN Phone Number: 04/22/2022, 10:49 AM  Clinical Narrative:    Damaris Schooner w/ pt in room; pt says she agrees to [facility]; document sent for ins auth; assigned Blaine Hamper ID [id number];  awaiting for ins auth.  -[id number]- Ins Auth received; Plan Auth ID # [id number], Blaine Hamper ID # [id number], start date 04/22/22, end date 04/26/22; LVM for [facility] Admissions for notification auth received and inquire if pt can be accepted this weekend; called facility and spoke w/ Gabriel Cirri; she said the business office was closed gave contact info for [facility] Admissions [phone]); LVM; awaiting return call; Dr Grandville Silos also notified auth received.  Expected Discharge Plan: [place] Barriers to Discharge: Continued Medical Work up  Expected Discharge Plan and Services   Discharge Planning Services: CM Consult Post Acute Care Choice: [place] Living arrangements for the past 2 months: Apartment Expected Discharge Date: 04/18/22                                     Social Determinants of Health (SDOH) Interventions SDOH Screenings   Food Insecurity: No Food Insecurity (04/08/2022)  Housing: Low Risk  (04/17/2022)  Transportation Needs: No Transportation Needs (04/08/2022)  Utilities: Not At Risk (04/08/2022)  Alcohol Screen: Low Risk  (09/13/2021)  Depression (PHQ2-9): Low Risk  (09/13/2021)  Financial Resource Strain: Low Risk  (09/13/2021)  Physical Activity: Inactive (09/13/2021)  Social Connections: Socially Isolated (09/13/2021)  Stress: No Stress Concern Present (09/13/2021)  Tobacco Use: Low Risk  (04/15/2022)    Readmission Risk Interventions     No data to display

## 2022-04-22 NOTE — Progress Notes (Signed)
PROGRESS NOTE    Allison Short  [id number] DOB: 01/15/1951 DOA: 04/08/2022 PCP: Dorothyann Peng, NP    Chief Complaint  Patient presents with   Leg Swelling    Brief Narrative:  Patient 72 year old female history of hypertension, NHL, lymphedema presented to the ED with complaints of worsening lymphedema, redness which seem to be worsening with some erythema and heat as well as increased weeping and draining.  Patient states ongoing drainage despite using lymphedema pump and compression hose.  Patient unable to wear closed due to excessive drainage.  Patient presented to the ED, lower extremity Dopplers done negative for DVT.  Patient admitted due to concerns for probable lower extremity cellulitis.   Assessment & Plan:   Principal Problem:   Cellulitis of lower extremity with chronic  lymphedema Active Problems:   Hypokalemia   Essential hypertension   Normocytic anemia   Non Hodgkin's lymphoma (HCC)   Morbid obesity (HCC)   Lymphedema   Acute on chronic respiratory failure with hypoxia and hypercapnia (HCC)   Delirium due to multiple etiologies, acute, hyperactive   Sleep disorder breathing  #1 cellulitis of lower extremity with chronic lymphedema -Patient with history of bilateral lymphedema presented 3-week history of worsening drainage, erythema, pain to right lower extremity with concerns for cellulitis of the right lower extremity. -Patient admitted. -Lower extremity Dopplers negative for DVT. -Patient noted to have received vancomycin and Lasix on presentation to the ED with a urine output of 1.9 L. -Some improvement with weeping from lower extremities. -Afebrile.  Improvement in leukocytosis.   -Patient received doses of IV Lasix with good urine output.   -Was on IV vancomycin, transitioned to oral doxycycline and completed a full course of antibiotic treatment.   -Patient assessed by PT OT who are recommending SNF placement.   -Will need outpatient follow-up with the  lymphedema clinic postdischarge.  -Follow.  2.  Hypokalemia/hypomagnesemia -Repleted.  3.  Hypertension -Controlled on current regimen of Lopressor, Lasix.   -Was on ACE inhibitor and HCTZ prior to admission which was held due to AKI and likely will not be resumed on discharge.    4.  Acute hypoxic hypercapnic respiratory failure/obesity hypoventilation syndrome  -Patient noted early on in the hospitalization to have increased oxygen requirements, complaints of worsening shortness of breath.  -Patient noted to have some expiratory wheezing during the hospitalization.  -Patient denied any prior history of tobacco use. -Patient stated has chronic postnasal drip leading to her cough and likely they are wheezing. -Patient initially was on Claritin, Afrin nasal twice daily as well as as needed Ocean nasal spray and scheduled DuoNebs.  Patient also noted to have received IV Lasix. -Chest x-ray done 04/13/2022 was concerning for right basilar infiltrate however follow-up chest x-ray done 04/14/2022 concerning for pulmonary edema. -Patient received a 5-day course of IV cefepime and was given as needed Lasix. -Concern for undiagnosed sleep apnea. -ABG done 04/16/2022 with a pH of 7.4, pCO2 of 76, pO2 of 83. -Patient seen in consultation by PCCM/pulmonary who felt patient may have undiagnosed sleep apnea, obesity-hypoventilation syndrome,  patient started on NIPPV which she seems to be doing well with that. -Patient was on Diamox. -PCCM/pulmonary recommending patient needs to be set up for NIPPV for discharge, TOC working on this, patient not an appropriate candidate for CPAP per pulmonary. -Outpatient follow-up with pulmonary.  5.  Normocytic anemia -Patient noted to have low vitamin B12 levels and started on oral vitamin B12 supplementation. -Hemoglobin stable at 12.0.  6.  Non-Hodgkin's lymphoma -Status post chemo/radiation about 10 years ago. -Outpatient follow-up.  7.   Delirium/confusion -Resolved. -CT head done negative for any acute abnormalities. -TSH, B12, folate within normal limits.   -Ammonia level within normal limits.  8.  Acute kidney injury -Renal function improved with diuresis.   -ACE inhibitor, HCTZ held likely will not resume on discharge.   9.  Morbid obesity -Lifestyle modification.   -Outpatient follow-up with PCP.    10.  Debility -Patient seen by PT/OT, who are recommending SNF.  Per PT/ OT patient unsafe for discharge home. -TOC consulted for SNF placement.    DVT prophylaxis: Lovenox Code Status: Full Family Communication: Updated patient.  No family at bedside. Disposition: Patient medically stable for SNF.  Awaiting SNF placement.  Status is: Inpatient The patient will require care spanning > 2 midnights and should be moved to inpatient because: Severity of illness.  Unsafe discharge.   Consultants:  [place], RN Melody Austin 04/11/2022 PCCM: Dr.Olalere 04/15/2022  Procedures: Lower extremity Dopplers 04/08/2022 CT head 04/15/2022 Chest x-ray 04/13/2022, 04/14/2022, 04/17/2022  Antimicrobials:  IV vancomycin 04/08/2022>>>> 04/11/2022 Oral doxycycline 04/11/2022>>>> 04/15/2022 IV cefepime 04/13/2022>>>>> 04/18/2022   Subjective: Laying in bed.  Stated use of BiPAP overnight without any issues.  Stated was given some hydroxyzine 30 minutes prior to BiPAP due to concern for claustrophobia.  No chest pain.  No shortness of breath.  No abdominal pain.  Complaining of itching in bilateral lower extremities.    Objective: Vitals:   04/22/22 0431 04/22/22 0608 04/22/22 0814 04/22/22 1130  BP:  (!) 130/56  (!) 122/52  Pulse: 63 (!) 58  (!) 58  Resp: '18 19 20 18  '$ Temp:  99 F (37.2 C)  98.1 F (36.7 C)  TempSrc:  Oral  Oral  SpO2: 100% 100%  93%  Weight:      Height:        Intake/Output Summary (Last 24 hours) at 04/22/2022 1148 Last data filed at 04/22/2022 0814 Gross per 24 hour  Intake 720 ml  Output 980 ml  Net -260 ml     Filed Weights   04/08/22 2046 04/13/22 1635  Weight: (!) 179.9 kg (!) 167.6 kg    Examination:  General exam: NAD. Respiratory system: CTAB.  No wheezes, no crackles, no rhonchi.  Fair air movement.  Speaking in full sentences.  Cardiovascular system: RRR no murmurs rubs or gallops.  No JVD.  Lower extremities with decreased erythema, decreased edema, wrapped.  Gastrointestinal system: Abdomen is soft, obese, nontender, nondistended, positive bowel sounds.  No rebound.  No guarding. Central nervous system: Alert and oriented. No focal neurological deficits. Extremities: Bilateral lower extremity lymphedema with significantly decreased weeping, decreased erythema, decreased warmth, decreased tenderness to palpation R > L Skin: Bilateral lower extremity with lymphedema, decreased erythema, decreased warmth, decreased tenderness to palpation.  Bilateral lower extremities wrapped with Kerlix and gauze and bandage.Marland Kitchen Psychiatry: Judgement and insight appear normal. Mood & affect appropriate.     Data Reviewed: I have personally reviewed following labs and imaging studies  CBC: Recent Labs  Lab 04/16/22 0248 04/20/22 0929  WBC 6.5 6.5  HGB 14.9 12.0  HCT 47.0* 38.3  MCV 102.6* 104.6*  PLT 222 208     Basic Metabolic Panel: Recent Labs  Lab 04/16/22 0248 04/20/22 0929  NA 140 138  K 3.7 3.8  CL 89* 100  CO2 38* 31  GLUCOSE 145* 178*  BUN 36* 28*  CREATININE 1.17* 0.84  CALCIUM 9.1 8.8*  MG  --  2.0     GFR: Estimated Creatinine Clearance: 91.4 mL/min (by C-G formula based on SCr of 0.84 mg/dL).  Liver Function Tests: Recent Labs  Lab 04/16/22 0248  AST 16  ALT 19  ALKPHOS 76  BILITOT 0.8  PROT 6.6  ALBUMIN 3.0*     CBG: Recent Labs  Lab 04/22/22 0813 04/22/22 1131  GLUCAP 180* 177*     Recent Results (from the past 240 hour(s))  MRSA Next Gen by PCR, Nasal     Status: None   Collection Time: 04/13/22  3:36 PM   Specimen: Nasal Mucosa; Nasal  Swab  Result Value Ref Range Status   MRSA by PCR Next Gen NOT DETECTED NOT DETECTED Final    Comment: (NOTE) The GeneXpert MRSA Assay (FDA approved for NASAL specimens only), is one component of a comprehensive MRSA colonization surveillance program. It is not intended to diagnose MRSA infection nor to guide or monitor treatment for MRSA infections. Test performance is not FDA approved in patients less than 86 years old. Performed at [facility], [place] [address], [place], [place] [address]          Radiology Studies: No results found.      Scheduled Meds:  bisacodyl  10 mg Oral Daily   vitamin B-12  500 mcg Oral Daily   enoxaparin (LOVENOX) injection  80 mg Subcutaneous Q24H   fluticasone  2 spray Each Nare Daily   furosemide  20 mg Oral Daily   hydrocerin   Topical BID   loratadine  10 mg Oral Daily   metoprolol tartrate  50 mg Oral BID   pantoprazole  40 mg Oral Daily   polyethylene glycol  17 g Oral Daily   Continuous Infusions:     LOS: 13 days    Time spent: 35 minutes    Irine Seal, MD [facility]   To contact the attending provider between 7A-7P or the covering provider during after hours 7P-7A, please log into the web site [email or web address] and access using universal [place] password for that web site. If you do not have the password, please call the hospital operator.  04/22/2022, 11:48 AM

## 2022-04-23 DIAGNOSIS — L03115 Cellulitis of right lower limb: Secondary | ICD-10-CM | POA: Diagnosis not present

## 2022-04-23 DIAGNOSIS — I890 Lymphedema, not elsewhere classified: Secondary | ICD-10-CM | POA: Diagnosis not present

## 2022-04-23 DIAGNOSIS — I10 Essential (primary) hypertension: Secondary | ICD-10-CM | POA: Diagnosis not present

## 2022-04-23 DIAGNOSIS — E876 Hypokalemia: Secondary | ICD-10-CM | POA: Diagnosis not present

## 2022-04-23 LAB — MAGNESIUM: Magnesium: 1.8 mg/dL (ref 1.7–2.4)

## 2022-04-23 LAB — BASIC METABOLIC PANEL
Anion gap: 7 (ref 5–15)
BUN: 27 mg/dL — ABNORMAL HIGH (ref 8–23)
CO2: 34 mmol/L — ABNORMAL HIGH (ref 22–32)
Calcium: 8.8 mg/dL — ABNORMAL LOW (ref 8.9–10.3)
Chloride: 98 mmol/L (ref 98–111)
Creatinine, Ser: 0.93 mg/dL (ref 0.44–1.00)
GFR, Estimated: >60 mL/min (ref >60)
Glucose, Bld: 173 mg/dL — ABNORMAL HIGH (ref 70–99)
Potassium: 4.4 mmol/L (ref 3.5–5.1)
Sodium: 139 mmol/L (ref 135–145)

## 2022-04-23 MED ORDER — MELATONIN 3 MG PO TABS
6.0000 mg | ORAL_TABLET | Freq: Once | ORAL | Status: AC
  Administered 2022-04-23: 6 mg via ORAL
  Filled 2022-04-23: qty 2

## 2022-04-23 MED ORDER — MELATONIN 3 MG PO TABS
6.0000 mg | ORAL_TABLET | Freq: Every evening | ORAL | Status: AC | PRN
  Administered 2022-04-23 – 2022-04-24 (×2): 6 mg via ORAL
  Filled 2022-04-23 (×2): qty 2

## 2022-04-23 NOTE — TOC Progression Note (Addendum)
Transition of Care [facility]) - Progression Note    Patient Details  Name: Allison Short MRN: [id number] Date of Birth: 1951/02/07  [facility] CM/SW Contact  Henrietta Dine, RN Phone Number: 04/23/2022, 10:19 AM  Clinical Narrative:     Dimitri Ped, Admissions at [facility] to see if pt can admit this weekend; :LVM; awaiting return call.  [phone]-Called by Nevin Bloodgood at [facility]; she says pt cannot admit this weekend per the director due to staffing and pt needs; she says pt can admit on Monday   Expected Discharge Plan: [place] Barriers to Discharge: Continued Medical Work up  Expected Discharge Plan and Services   Discharge Planning Services: CM Consult Post Acute Care Choice: [place] Living arrangements for the past 2 months: Apartment Expected Discharge Date: 04/18/22                                     Social Determinants of Health (SDOH) Interventions SDOH Screenings   Food Insecurity: No Food Insecurity (04/08/2022)  Housing: Low Risk  (04/17/2022)  Transportation Needs: No Transportation Needs (04/08/2022)  Utilities: Not At Risk (04/08/2022)  Alcohol Screen: Low Risk  (09/13/2021)  Depression (PHQ2-9): Low Risk  (09/13/2021)  Financial Resource Strain: Low Risk  (09/13/2021)  Physical Activity: Inactive (09/13/2021)  Social Connections: Socially Isolated (09/13/2021)  Stress: No Stress Concern Present (09/13/2021)  Tobacco Use: Low Risk  (04/15/2022)    Readmission Risk Interventions     No data to display

## 2022-04-23 NOTE — Progress Notes (Signed)
Occupational Therapy Treatment Patient Details Name: Allison Short MRN: [id number] DOB: 06-Nov-1950 Today's Date: 04/23/2022   History of present illness Patient is a 72 year old female who presented  on 04/08/2022 with weeping and draining lymphedema with increased redness and heat. Patient was admitted with cellulitis of lower extremities with chronic lymphedema, hypokalemia, and expiratory wheezing. LE dopplers negative for DVT. PMH including but not limited to: B TKA, HTN, Non-hodgkin's lymphoma, lymphedema, and morbid obesity.   OT comments  Patient was noted to make progress towards goals. Patient was able to transfer to [facility] and recliner with min guard with RW with increased time. Patient was noted to have less anxiousness on this date and more self confidence. Patient's discharge plan remains appropriate at this time. OT will continue to follow acutely.     Recommendations for follow up therapy are one component of a multi-disciplinary discharge planning process, led by the attending physician.  Recommendations may be updated based on patient status, additional functional criteria and insurance authorization.    Follow Up Recommendations  Skilled nursing-short term rehab (<3 hours/day)     Assistance Recommended at Discharge Frequent or constant Supervision/Assistance  Patient can return home with the following  Two people to help with walking and/or transfers;Assistance with cooking/housework;Direct supervision/assist for medications management;Assist for transportation;Help with stairs or ramp for entrance;Direct supervision/assist for financial management;Two people to help with bathing/dressing/bathroom   Equipment Recommendations  None recommended by OT       Precautions / Restrictions Precautions Precautions: Fall Precaution Comments: Lymphedema in BLEs Restrictions Weight Bearing Restrictions: No       Mobility Bed Mobility Overal bed mobility: Needs Assistance Bed  Mobility: Supine to Sit     Supine to sit: Mod assist     General bed mobility comments: physical assist to get BLE off edge of bed.              ADL either performed or assessed with clinical judgement   ADL Overall ADL's : Needs assistance/impaired       Grooming Details (indicate cue type and reason): patient reported having combed hair x2 today with her personal comb that friend brought to her.                 Toilet Transfer: Minimal assistance;Rolling walker (2 wheels);Stand-pivot;BSC/3in1 Armed forces technical officer Details (indicate cue type and reason): with increased cues for deep breathing. Toileting- Clothing Manipulation and Hygiene: Moderate assistance;Sit to/from stand Toileting - Clothing Manipulation Details (indicate cue type and reason): with one hand on RW with cues for deep breathign to remain calm during attempt to wash bottom. patient reported wiping bottom while seated at home. patient declined to attempt seated on this date.              Cognition Arousal/Alertness: Awake/alert Behavior During Therapy: WFL for tasks assessed/performed Overall Cognitive Status: Within Functional Limits for tasks assessed       General Comments: patient continues to have anxiousness but cooperative during session                   Pertinent Vitals/ Pain       Pain Assessment Pain Assessment: Faces Faces Pain Scale: Hurts a little bit Pain Location: BLE R>L with movement Pain Descriptors / Indicators: Discomfort, Heaviness, Guarding Pain Intervention(s): Limited activity within patient's tolerance, Monitored during session, Repositioned         Frequency  Min 2X/week        Progress Toward Goals  OT  Goals(current goals can now be found in the care plan section)  Progress towards OT goals: Progressing toward goals     Plan Discharge plan remains appropriate       AM-PAC OT "6 Clicks" Daily Activity     Outcome Measure   Help from another  person eating meals?: A Little Help from another person taking care of personal grooming?: A Little Help from another person toileting, which includes using toliet, bedpan, or urinal?: A Lot Help from another person bathing (including washing, rinsing, drying)?: A Lot Help from another person to put on and taking off regular upper body clothing?: A Little Help from another person to put on and taking off regular lower body clothing?: A Lot 6 Click Score: 15    End of Session Equipment Utilized During Treatment: Gait belt;Rolling walker (2 wheels)  OT Visit Diagnosis: Unsteadiness on feet (R26.81);Other abnormalities of gait and mobility (R26.89);Muscle weakness (generalized) (M62.81)   Activity Tolerance Patient tolerated treatment well   Patient Left in chair;with call bell/phone within reach   Nurse Communication          Time: [id number] OT Time Calculation (min): 40 min  Charges: OT General Charges $OT Visit: 1 Visit OT Treatments $Self Care/Home Management : 38-52 mins  Rennie Plowman, MS Acute Rehabilitation Department Office# [phone]   Willa Rough 04/23/2022, 12:51 PM

## 2022-04-23 NOTE — Progress Notes (Signed)
PROGRESS NOTE    Allison Short  [id number] DOB: [birth year] DOA: 04/08/2022 PCP: Dorothyann Peng, NP    Chief Complaint  Patient presents with   Leg Swelling    Brief Narrative:  Patient 72 year old female history of hypertension, NHL, lymphedema presented to the ED with complaints of worsening lymphedema, redness which seem to be worsening with some erythema and heat as well as increased weeping and draining.  Patient states ongoing drainage despite using lymphedema pump and compression hose.  Patient unable to wear closed due to excessive drainage.  Patient presented to the ED, lower extremity Dopplers done negative for DVT.  Patient admitted due to concerns for probable lower extremity cellulitis.   Assessment & Plan:   Principal Problem:   Cellulitis of lower extremity with chronic  lymphedema Active Problems:   Hypokalemia   Essential hypertension   Normocytic anemia   Non Hodgkin's lymphoma (HCC)   Morbid obesity (HCC)   Lymphedema   Acute on chronic respiratory failure with hypoxia and hypercapnia (HCC)   Delirium due to multiple etiologies, acute, hyperactive   Sleep disorder breathing  #1 cellulitis of lower extremity with chronic lymphedema -Patient with history of bilateral lymphedema presented 3-week history of worsening drainage, erythema, pain to right lower extremity with concerns for cellulitis of the right lower extremity. -Patient admitted. -Lower extremity Dopplers negative for DVT. -Patient noted to have received vancomycin and Lasix on presentation to the ED with a urine output of 1.9 L. -Some improvement with weeping from lower extremities. -Afebrile.  Improvement in leukocytosis.   -Patient received doses of IV Lasix with good urine output.   -Was on IV vancomycin, transitioned to oral doxycycline and completed a full course of antibiotic treatment.   -Patient assessed by PT OT who are recommending SNF placement.   -Will need outpatient follow-up with the  lymphedema clinic postdischarge.  -Follow.  2.  Hypokalemia/hypomagnesemia -Repleted.  3.  Hypertension -Controlled on current regimen of Lopressor, Lasix.   -Was on ACE inhibitor and HCTZ prior to admission which was held due to AKI and likely will not be resumed on discharge.    4.  Acute hypoxic hypercapnic respiratory failure/obesity hypoventilation syndrome  -Patient noted early on in the hospitalization to have increased oxygen requirements, complaints of worsening shortness of breath.  -Patient noted to have some expiratory wheezing during the hospitalization.  -Patient denied any prior history of tobacco use. -Patient stated has chronic postnasal drip leading to her cough and likely they are wheezing. -Patient initially was on Claritin, Afrin nasal twice daily as well as as needed Ocean nasal spray and scheduled DuoNebs.  Patient also noted to have received IV Lasix. -Chest x-ray done 04/13/2022 was concerning for right basilar infiltrate however follow-up chest x-ray done 04/14/2022 concerning for pulmonary edema. -Patient received a 5-day course of IV cefepime and was given as needed Lasix. -Concern for undiagnosed sleep apnea. -ABG done 04/16/2022 with a pH of 7.4, pCO2 of 76, pO2 of 83. -Patient seen in consultation by PCCM/pulmonary who felt patient may have undiagnosed sleep apnea, obesity-hypoventilation syndrome,  patient started on NIPPV which she seems to be doing well with that. -Patient was on Diamox. -PCCM/pulmonary recommending patient needs to be set up for NIPPV for discharge, TOC working on this, patient not an appropriate candidate for CPAP per pulmonary. -Outpatient follow-up with pulmonary.  5.  Normocytic anemia -Patient noted to have low vitamin B12 levels and started on oral vitamin B12 supplementation. -Hemoglobin stable at 12.0. -Outpatient follow-up.  6.  Non-Hodgkin's lymphoma -Status post chemo/radiation about 10 years ago. -Outpatient  follow-up.  7.  Delirium/confusion -Resolved. -CT head done negative for any acute abnormalities. -TSH, B12, folate within normal limits.   -Ammonia level within normal limits.  8.  Acute kidney injury -Renal function improved with diuresis.   -ACE inhibitor, HCTZ on hold will likely not resume on discharge.   9.  Morbid obesity -Lifestyle modification.   -Outpatient follow-up with PCP.    10.  Debility -Patient seen by PT/OT, who are recommending SNF.   -Per PT/ OT patient unsafe for discharge home. -TOC consulted for SNF placement.    DVT prophylaxis: Lovenox Code Status: Full Family Communication: Updated patient.  No family at bedside. Disposition: Patient medically stable for SNF.  Awaiting SNF placement.  Status is: Inpatient The patient will require care spanning > 2 midnights and should be moved to inpatient because: Severity of illness.  Unsafe discharge.   Consultants:  [place], RN Melody Austin 04/11/2022 PCCM: Dr.Olalere 04/15/2022  Procedures: Lower extremity Dopplers 04/08/2022 CT head 04/15/2022 Chest x-ray 04/13/2022, 04/14/2022, 04/17/2022  Antimicrobials:  IV vancomycin 04/08/2022>>>> 04/11/2022 Oral doxycycline 04/11/2022>>>> 04/15/2022 IV cefepime 04/13/2022>>>>> 04/18/2022   Subjective: Sitting up in bed.  No chest pain.  No shortness of breath.  Feels hydroxyzine cream and Sarna helped with the itching in her legs.  Tolerating BiPAP overnight.   Objective: Vitals:   04/23/22 0100 04/23/22 0536 04/23/22 0604 04/23/22 0800  BP:  119/61    Pulse: 68 64    Resp: '18 18  20  '$ Temp:  98.4 F (36.9 C)    TempSrc:  Oral    SpO2: 98% 94% 96%   Weight:      Height:        Intake/Output Summary (Last 24 hours) at 04/23/2022 1017 Last data filed at 04/23/2022 0900 Gross per 24 hour  Intake 530 ml  Output 950 ml  Net -420 ml    Filed Weights   04/08/22 2046 04/13/22 1635  Weight: (!) 179.9 kg (!) 167.6 kg    Examination:  General exam: NAD. Respiratory  system: Lungs clear to auscultation bilaterally.  No wheezes, no crackles, no rhonchi.  Fair air movement.  Speaking in full sentences.  Cardiovascular system: Regular rate rhythm no murmurs rubs or gallops.  No JVD.  Decreased lower extremity edema.   Gastrointestinal system: Abdomen is soft, obese, nontender, nondistended, positive bowel sounds.  No rebound.  No guarding.  Central nervous system: Alert and oriented.  Moving extremities spontaneously.  No focal neurological deficits.  Extremities: Bilateral lower extremity lymphedema with significantly decreased weeping, decreased erythema, decreased warmth, decreased tenderness to palpation R > L Skin: Bilateral lower extremity with lymphedema, decreased erythema, decreased warmth, decreased tenderness to palpation.  Bilateral lower extremities wrapped with Kerlix and gauze and bandage.Marland Kitchen Psychiatry: Judgement and insight appear normal. Mood & affect appropriate.     Data Reviewed: I have personally reviewed following labs and imaging studies  CBC: Recent Labs  Lab 04/20/22 0929  WBC 6.5  HGB 12.0  HCT 38.3  MCV 104.6*  PLT 208     Basic Metabolic Panel: Recent Labs  Lab 04/20/22 0929 04/23/22 0525  NA 138 139  K 3.8 4.4  CL 100 98  CO2 31 34*  GLUCOSE 178* 173*  BUN 28* 27*  CREATININE 0.84 0.93  CALCIUM 8.8* 8.8*  MG 2.0 1.8     GFR: Estimated Creatinine Clearance: 82.6 mL/min (by C-G formula based on SCr of  0.93 mg/dL).  Liver Function Tests: No results for input(s): "AST", "ALT", "ALKPHOS", "BILITOT", "PROT", "ALBUMIN" in the last 168 hours.   CBG: Recent Labs  Lab 04/22/22 0813 04/22/22 1131 04/22/22 1620  GLUCAP 180* 177* 133*      Recent Results (from the past 240 hour(s))  MRSA Next Gen by PCR, Nasal     Status: None   Collection Time: 04/13/22  3:36 PM   Specimen: Nasal Mucosa; Nasal Swab  Result Value Ref Range Status   MRSA by PCR Next Gen NOT DETECTED NOT DETECTED Final    Comment:  (NOTE) The GeneXpert MRSA Assay (FDA approved for NASAL specimens only), is one component of a comprehensive MRSA colonization surveillance program. It is not intended to diagnose MRSA infection nor to guide or monitor treatment for MRSA infections. Test performance is not FDA approved in patients less than 66 years old. Performed at [facility], [place] [address] [place], [place] [address]          Radiology Studies: No results found.      Scheduled Meds:  bisacodyl  10 mg Oral Daily   vitamin B-12  500 mcg Oral Daily   enoxaparin (LOVENOX) injection  80 mg Subcutaneous Q24H   fluticasone  2 spray Each Nare Daily   furosemide  20 mg Oral Daily   hydrocerin   Topical BID   loratadine  10 mg Oral Daily   metoprolol tartrate  50 mg Oral BID   pantoprazole  40 mg Oral Daily   polyethylene glycol  17 g Oral Daily   Continuous Infusions:     LOS: 14 days    Time spent: 35 minutes    Irine Seal, MD [facility]   To contact the attending provider between 7A-7P or the covering provider during after hours 7P-7A, please log into the web site [email or web address] and access using universal [place] password for that web site. If you do not have the password, please call the hospital operator.  04/23/2022, 10:17 AM

## 2022-04-24 DIAGNOSIS — I10 Essential (primary) hypertension: Secondary | ICD-10-CM | POA: Diagnosis not present

## 2022-04-24 DIAGNOSIS — I890 Lymphedema, not elsewhere classified: Secondary | ICD-10-CM | POA: Diagnosis not present

## 2022-04-24 DIAGNOSIS — E876 Hypokalemia: Secondary | ICD-10-CM | POA: Diagnosis not present

## 2022-04-24 DIAGNOSIS — L03115 Cellulitis of right lower limb: Secondary | ICD-10-CM | POA: Diagnosis not present

## 2022-04-24 NOTE — Progress Notes (Signed)
PROGRESS NOTE    Allison Short  [id number] DOB: 02-28-1950 DOA: 04/08/2022 PCP: Dorothyann Peng, NP    Chief Complaint  Patient presents with   Leg Swelling    Brief Narrative:  Patient 72 year old female history of hypertension, NHL, lymphedema presented to the ED with complaints of worsening lymphedema, redness which seem to be worsening with some erythema and heat as well as increased weeping and draining.  Patient states ongoing drainage despite using lymphedema pump and compression hose.  Patient unable to wear closed due to excessive drainage.  Patient presented to the ED, lower extremity Dopplers done negative for DVT.  Patient admitted due to concerns for probable lower extremity cellulitis.   Assessment & Plan:   Principal Problem:   Cellulitis of lower extremity with chronic  lymphedema Active Problems:   Hypokalemia   Essential hypertension   Normocytic anemia   Non Hodgkin's lymphoma (HCC)   Morbid obesity (HCC)   Lymphedema   Acute on chronic respiratory failure with hypoxia and hypercapnia (HCC)   Delirium due to multiple etiologies, acute, hyperactive   Sleep disorder breathing  #1 cellulitis of lower extremity with chronic lymphedema -Patient with history of bilateral lymphedema presented 3-week history of worsening drainage, erythema, pain to right lower extremity with concerns for cellulitis of the right lower extremity. -Patient admitted. -Lower extremity Dopplers negative for DVT. -Patient noted to have received vancomycin and Lasix on presentation to the ED with a urine output of 1.9 L. -Some improvement with weeping from lower extremities. -Afebrile.  Improvement in leukocytosis.   -Patient received doses of IV Lasix with good urine output.   -Patient is -17.677 L during this hospitalization -Was on IV vancomycin, transitioned to oral doxycycline and completed a full course of antibiotic treatment.   -Patient assessed by PT OT who are recommending SNF  placement.   -Will need outpatient follow-up with the lymphedema clinic postdischarge.  -Follow.  2.  Hypokalemia/hypomagnesemia -Repleted.  3.  Hypertension -Blood pressure controlled on current regimen of Lopressor, Lasix.   -Continue to hold ACE inhibitor and HCTZ likely will not resume on discharge as patient had presented with AKI.    4.  Acute hypoxic hypercapnic respiratory failure/obesity hypoventilation syndrome  -Patient noted early on in the hospitalization to have increased oxygen requirements, complaints of worsening shortness of breath.  -Patient noted to have some expiratory wheezing during the hospitalization.  -Patient denied any prior history of tobacco use. -Patient stated has chronic postnasal drip leading to her cough and likely they are wheezing. -Patient initially was on Claritin, Afrin nasal twice daily as well as as needed Ocean nasal spray and scheduled DuoNebs.  Patient also noted to have received IV Lasix. -Chest x-ray done 04/13/2022 was concerning for right basilar infiltrate however follow-up chest x-ray done 04/14/2022 concerning for pulmonary edema. -Patient received a 5-day course of IV cefepime and was given as needed Lasix. -Concern for undiagnosed sleep apnea. -ABG done 04/16/2022 with a pH of 7.4, pCO2 of 76, pO2 of 83. -Patient seen in consultation by PCCM/pulmonary who felt patient may have undiagnosed sleep apnea, obesity-hypoventilation syndrome,  patient started on NIPPV which she seems to be doing well with that. -Patient was on Diamox. -PCCM/pulmonary recommending patient needs to be set up for NIPPV for discharge, TOC working on this, patient not an appropriate candidate for CPAP per pulmonary. -Outpatient follow-up with pulmonary.  5.  Normocytic anemia -Patient noted to have low vitamin B12 levels and started on oral vitamin B12 supplementation. -Hemoglobin stable  at 12.0. -Outpatient follow-up.  6.  Non-Hodgkin's lymphoma -Status post  chemo/radiation about 10 years ago. -Outpatient follow-up.  7.  Delirium/confusion -Resolved. -CT head done negative for any acute abnormalities. -TSH, B12, folate within normal limits.   -Ammonia level within normal limits.  8.  Acute kidney injury -Renal function improved with diuresis.   -Continue to hold ACE inhibitor HCTZ likely will not resume on discharge.   9.  Morbid obesity -Lifestyle modification.   -Outpatient follow-up with PCP.    10.  Debility -Patient seen by PT/OT, who are recommending SNF.   -Per PT/ OT patient unsafe for discharge home. -TOC consulted for SNF placement.    DVT prophylaxis: Lovenox Code Status: Full Family Communication: Updated patient.  No family at bedside. Disposition: Patient medically stable for SNF.  Awaiting SNF placement.  Status is: Inpatient The patient will require care spanning > 2 midnights and should be moved to inpatient because: Severity of illness.  Unsafe discharge.   Consultants:  [place], RN Melody Austin 04/11/2022 PCCM: Dr.Olalere 04/15/2022  Procedures: Lower extremity Dopplers 04/08/2022 CT head 04/15/2022 Chest x-ray 04/13/2022, 04/14/2022, 04/17/2022  Antimicrobials:  IV vancomycin 04/08/2022>>>> 04/11/2022 Oral doxycycline 04/11/2022>>>> 04/15/2022 IV cefepime 04/13/2022>>>>> 04/18/2022   Subjective: Patient laying in bed.  States he used BiPAP most of the night last night and tolerated it okay.  No chest pain or shortness of breath.  No abdominal pain.   Objective: Vitals:   04/23/22 1345 04/23/22 2024 04/24/22 0622 04/24/22 0906  BP: 90/68 139/66 136/60 (!) 139/59  Pulse: 61 80 65 72  Resp: '20 18 20 16  '$ Temp: 98.9 F (37.2 C) 98.1 F (36.7 C) 97.8 F (36.6 C)   TempSrc: Oral Oral Oral   SpO2: 100% 99% 98% 92%  Weight:      Height:        Intake/Output Summary (Last 24 hours) at 04/24/2022 1038 Last data filed at 04/24/2022 0200 Gross per 24 hour  Intake 480 ml  Output 750 ml  Net -270 ml    Filed  Weights   04/08/22 2046 04/13/22 1635  Weight: (!) 179.9 kg (!) 167.6 kg    Examination:  General exam: NAD. Respiratory system: CTAB.  No wheezes, no crackles, no rhonchi.  Fair air movement.  Speaking in full sentences.  Cardiovascular system: RRR no murmurs rubs or gallops.  No JVD.  Decreased lower extremity edema.  Gastrointestinal system: Abdomen is soft, obese, nontender, nondistended, positive bowel sounds.  No rebound.  No guarding.  Central nervous system: Alert and oriented.  Moving extremities spontaneously.  No focal neurological deficits.  Extremities: Bilateral lower extremity lymphedema with significantly decreased weeping, decreased erythema, decreased warmth, decreased tenderness to palpation R > L Skin: Bilateral lower extremity with lymphedema, decreased erythema, decreased warmth, decreased tenderness to palpation.  Bilateral lower extremities wrapped with Kerlix and gauze and bandage.Marland Kitchen Psychiatry: Judgement and insight appear normal. Mood & affect appropriate.     Data Reviewed: I have personally reviewed following labs and imaging studies  CBC: Recent Labs  Lab 04/20/22 0929  WBC 6.5  HGB 12.0  HCT 38.3  MCV 104.6*  PLT 208     Basic Metabolic Panel: Recent Labs  Lab 04/20/22 0929 04/23/22 0525  NA 138 139  K 3.8 4.4  CL 100 98  CO2 31 34*  GLUCOSE 178* 173*  BUN 28* 27*  CREATININE 0.84 0.93  CALCIUM 8.8* 8.8*  MG 2.0 1.8     GFR: Estimated Creatinine Clearance: 82.6 mL/min (  by C-G formula based on SCr of 0.93 mg/dL).  Liver Function Tests: No results for input(s): "AST", "ALT", "ALKPHOS", "BILITOT", "PROT", "ALBUMIN" in the last 168 hours.   CBG: Recent Labs  Lab 04/22/22 0813 04/22/22 1131 04/22/22 1620  GLUCAP 180* 177* 133*      No results found for this or any previous visit (from the past 240 hour(s)).        Radiology Studies: No results found.      Scheduled Meds:  bisacodyl  10 mg Oral Daily   vitamin  B-12  500 mcg Oral Daily   enoxaparin (LOVENOX) injection  80 mg Subcutaneous Q24H   fluticasone  2 spray Each Nare Daily   furosemide  20 mg Oral Daily   hydrocerin   Topical BID   loratadine  10 mg Oral Daily   metoprolol tartrate  50 mg Oral BID   pantoprazole  40 mg Oral Daily   polyethylene glycol  17 g Oral Daily   Continuous Infusions:     LOS: 15 days    Time spent: 35 minutes    Irine Seal, MD [facility]   To contact the attending provider between 7A-7P or the covering provider during after hours 7P-7A, please log into the web site [email or web address] and access using universal [place] password for that web site. If you do not have the password, please call the hospital operator.  04/24/2022, 10:38 AM

## 2022-04-25 ENCOUNTER — Other Ambulatory Visit (HOSPITAL_COMMUNITY): Payer: Self-pay

## 2022-04-25 DIAGNOSIS — E876 Hypokalemia: Secondary | ICD-10-CM | POA: Diagnosis not present

## 2022-04-25 DIAGNOSIS — I10 Essential (primary) hypertension: Secondary | ICD-10-CM | POA: Diagnosis not present

## 2022-04-25 DIAGNOSIS — L03115 Cellulitis of right lower limb: Secondary | ICD-10-CM | POA: Diagnosis not present

## 2022-04-25 DIAGNOSIS — I890 Lymphedema, not elsewhere classified: Secondary | ICD-10-CM | POA: Diagnosis not present

## 2022-04-25 MED ORDER — IPRATROPIUM-ALBUTEROL 0.5-2.5 (3) MG/3ML IN SOLN: 3.0000 mL | Freq: Four times a day (QID) | RESPIRATORY_TRACT | 1 refills | Status: DC | PRN

## 2022-04-25 NOTE — TOC Progression Note (Incomplete Revision)
Transition of Care [facility]) - Progression Note    Patient Details  Name: Allison Short MRN: [id number] Date of Birth: [birth year]  Transition of Care [facility]) CM/SW Contact  Reneta Niehaus, [name], RN Phone Number: 04/25/2022, 12:43 PM  Clinical Narrative: Awaiting approval fro Bipap by Mendel Corning rep piror d/c to [facility]. MD updated.  Expected Discharge Plan: [place] Barriers to Discharge: Continued Medical Work up  Expected Discharge Plan and Services   Discharge Planning Services: CM Consult Post Acute Care Choice: [place] Living arrangements for the past 2 months: Apartment Expected Discharge Date: 04/25/22                                     Social Determinants of Health (SDOH) Interventions SDOH Screenings   Food Insecurity: No Food Insecurity (04/08/2022)  Housing: Low Risk  (04/17/2022)  Transportation Needs: No Transportation Needs (04/08/2022)  Utilities: Not At Risk (04/08/2022)  Alcohol Screen: Low Risk  (09/13/2021)  Depression (PHQ2-9): Low Risk  (09/13/2021)  Financial Resource Strain: Low Risk  (09/13/2021)  Physical Activity: Inactive (09/13/2021)  Social Connections: Socially Isolated (09/13/2021)  Stress: No Stress Concern Present (09/13/2021)  Tobacco Use: Low Risk  (04/15/2022)    Readmission Risk Interventions     No data to display

## 2022-04-25 NOTE — Discharge Summary (Signed)
Physician Discharge Summary  Allison Short [id number] DOB: 1950/05/02 DOA: 04/08/2022  PCP: Dorothyann Peng, NP  Admit date: 04/08/2022 Discharge date: 04/25/2022  Time spent: 55 minutes  Recommendations for Outpatient Follow-up:  Follow-up with Dr. Keturah Barre, pulmonary on 05/10/2022 at 3:30 PM for further evaluation on acute on chronic respiratory failure with hypoxia and hypercapnia.  Will also need evaluation for probable sleep study. Up with MD at skilled nursing facility.  Patient will need a basic metabolic profile, magnesium level done in 1 week to follow-up on electrolytes and renal function. Follow-up with Dorothyann Peng, NP once discharged from skilled nursing facility.  Will likely need referral back to lymphedema clinic.   Discharge Diagnoses:  Principal Problem:   Cellulitis of lower extremity with chronic  lymphedema Active Problems:   Hypokalemia   Essential hypertension   Normocytic anemia   Non Hodgkin's lymphoma (HCC)   Morbid obesity (HCC)   Lymphedema   Acute on chronic respiratory failure with hypoxia and hypercapnia (HCC)   Delirium due to multiple etiologies, acute, hyperactive   Sleep disorder breathing   Discharge Condition: Stable and improved.  Diet recommendation: Heart healthy  Filed Weights   04/08/22 2046 04/13/22 1635  Weight: (!) 179.9 kg (!) 167.6 kg    History of present illness:  HPI per Dr. Monia Pouch is a 72 y.o. female with medical history significant of HTN, NHL, lymphedema who presented to ED with complaints of worsening lymphedema and redness that seems to be getting worse.  She states for the last month her swelling has gotten worse and she has continued to have worsening erythema to lower extremities. Her right leg started to drain about 3 weeks ago and got so worse it prompted her to come to ED.  She has pain with movement and the drainage has gotten progressively worse. She went to the lymphedema clinic x 3 months after her  hospital discharge in 07/2020 for LE cellulitis. She was then discharged home and has been managing her lymphedema at home with her lymphedema pump and compression hose. She has not been able to wear the hose due to the drainage. She has tried to keep it as clean as possible. She has been washing with antibacterial soap and spray. She denies any trauma to her legs. Sedentary life, no long travel. Hx of blood clot >10 years ago with chemo port in her jugular vein.    She had an evisit with a provider today and was able to send pictures and they advised she come to the ED.    Denies any fever/chills, vision changes/headaches, chest pain or palpitations, shortness of breath or cough, abdominal pain, N/V/D, dysuria or leg swelling.    She does not smoke or drink alcohol.    ER Course:  vitals: afebrile, bp: 184/94, HR: 84, RR: 20, oxygen: 98%RA Pertinent labs: wbc: 10.8, mcv: 104.6, magnesium: 1.6, b12: 312 DVT study: negative In ED: given vanc and lasix. TRH asked to admit.   Hospital Course:  #1 cellulitis of lower extremity with chronic lymphedema -Patient with history of bilateral lymphedema presented 3-week history of worsening drainage, erythema, pain to right lower extremity with concerns for cellulitis of the right lower extremity. -Patient admitted. -Lower extremity Dopplers negative for DVT. -Patient noted to have received vancomycin and Lasix on presentation to the ED with a urine output of 1.9 L. -Some improvement with weeping from lower extremities. -Afebrile.  Improvement in leukocytosis.   -Patient received doses of IV Lasix with good  urine output.   -Patient is -14.067 L during this hospitalization -Was on IV vancomycin, transitioned to oral doxycycline and completed a full course of antibiotic treatment.   -Patient assessed by PT OT who are recommending SNF placement.   -Will need outpatient follow-up with the lymphedema clinic postdischarge.  -Patient was discharged to skilled  nursing facility.   2.  Hypokalemia/hypomagnesemia -Repleted.   3.  Hypertension -Blood pressure controlled on current regimen of Lopressor, Lasix.   -ACE inhibitor and HCTZ held during the hospitalization will not be resumed on discharge.    4.  Acute hypoxic hypercapnic respiratory failure/obesity hypoventilation syndrome  -Patient noted early on in the hospitalization to have increased oxygen requirements, complaints of worsening shortness of breath.  -Patient noted to have some expiratory wheezing during the hospitalization.  -Patient denied any prior history of tobacco use. -Patient stated has chronic postnasal drip leading to her cough and likely they are wheezing. -Patient initially was on Claritin, Afrin nasal twice daily as well as as needed Ocean nasal spray and scheduled DuoNebs.  Patient also noted to have received IV Lasix. -Chest x-ray done 04/13/2022 was concerning for right basilar infiltrate however follow-up chest x-ray done 04/14/2022 concerning for pulmonary edema. -Patient received a 5-day course of IV cefepime and was given as needed Lasix. -Concern for undiagnosed sleep apnea. -ABG done 04/16/2022 with a pH of 7.4, pCO2 of 76, pO2 of 83. -Patient seen in consultation by PCCM/pulmonary who felt patient may have undiagnosed sleep apnea, obesity-hypoventilation syndrome,  patient started on NIPPV which she seems to be doing well with that. -Patient was on Diamox. -PCCM/pulmonary recommending patient needs to be set up for NIPPV for discharge, TOC working on this, patient not an appropriate candidate for CPAP per pulmonary. -Outpatient follow-up with pulmonary.   5.  Normocytic anemia -Patient noted to have low vitamin B12 levels and started on oral vitamin B12 supplementation. -Hemoglobin stable at 12.0. -Outpatient follow-up.   6.  Non-Hodgkin's lymphoma -Status post chemo/radiation about 10 years ago. -Outpatient follow-up.   7.   Delirium/confusion -Resolved. -CT head done negative for any acute abnormalities. -TSH, B12, folate within normal limits.   -Ammonia level within normal limits.   8.  Acute kidney injury -Renal function improved with diuresis.   -ACE inhibitor and HCTZ were held during the hospitalization will not be resumed on discharge.    9.  Morbid obesity -Lifestyle modification.   -Outpatient follow-up with PCP.     10.  Debility -Patient seen by PT/OT, who are recommending SNF.   -Per PT/ OT patient unsafe for discharge home. -TOC consulted for SNF placement.  -Patient be discharged to SNF.      Procedures: Lower extremity Dopplers 04/08/2022 CT head 04/15/2022 Chest x-ray 04/13/2022, 04/14/2022, 04/17/2022  Consultations: Sharon, RN Melody Austin 04/11/2022 PCCM: Dr.Olalere 04/15/2022  Discharge Exam: Vitals:   04/25/22 0623 04/25/22 0935  BP: 131/73 (!) 147/74  Pulse: 63 66  Resp: 19   Temp: 97.9 F (36.6 C)   SpO2: 95%     General: NAD Cardiovascular: RRR no murmurs rubs or gallops.  No JVD.  No pitting lower extremity edema. Respiratory: Clear to auscultation bilaterally.  No wheezes, no crackles, no rhonchi.  Fair air movement.  Speaking in full sentences. Extremities: Bilateral lower extremity lymphedema with significantly decreased weeping, decreased erythema, decreased warmth, decreased tenderness to palpation R > L   Discharge Instructions   Discharge Instructions     Diet - low sodium heart healthy  Complete by: As directed    Discharge wound care:   Complete by: As directed    Every 3 days     Comments: Single layer of xeroform to weeping areas of bilateral LEs, top with ABD pads, secure with kerlix and ACE wraps from toes to knees. Change every 3 days. OK to take ACE wraps off at HS and reapply each AM.  04/11/22 0906   Discharge wound care:   Complete by: As directed    Single layer of Xeroform to weeping areas of bilateral lower extremities, top with ABD pads  secure with Kerlix and ace wraps from toes to knees change every 2 days.  Okay to take Ace wrap off at night and reapply first thing in the morning.   Discharge wound care:   Complete by: As directed    Single level 0 form to weeping areas of bilateral lower extremity top with ABD pad secure with Kerlix and Ace wrap from toes to knees.  Change every 2 days.  Okay to take Ace wrap off at night and reapply in the morning.   Increase activity slowly   Complete by: As directed    Increase activity slowly   Complete by: As directed    No wound care   Complete by: As directed       Allergies as of 04/25/2022       Reactions   Ciprofloxin Hcl [ciprofloxacin] Other (See Comments)   Unstable gait   Morphine And Related Anaphylaxis, Other (See Comments)   Tolerates hydrocodone   Asa [aspirin] Hives, Other (See Comments)   Hives can become SEVERE, requiring intervention   Codeine Hives   Chia Oil Swelling, Other (See Comments)   Possibly "chia tea" = Face and lips became swollen   Erythromycin Other (See Comments)   Reaction not recalled   Nsaids Other (See Comments)   Lowers GFR   Penicillins Other (See Comments)   Reaction not recalled- was told to "never take this again" by family        Medication List     STOP taking these medications    benazepril-hydrochlorthiazide 20-25 MG tablet Commonly known as: LOTENSIN HCT       TAKE these medications    acetaminophen 500 MG tablet Commonly known as: TYLENOL Take 1,000 mg by mouth every 8 (eight) hours as needed for mild pain, moderate pain or headache.   bisacodyl 5 MG EC tablet Commonly known as: DULCOLAX Take 2 tablets (10 mg total) by mouth daily.   camphor-menthol lotion Commonly known as: SARNA Apply topically as needed for itching.   cyanocobalamin 1000 MCG tablet Commonly known as: VITAMIN B12 Take 1 tablet (1,000 mcg total) by mouth daily.   fluticasone 50 MCG/ACT nasal spray Commonly known as: FLONASE Place  1 spray into both nostrils daily.   furosemide 20 MG tablet Commonly known as: LASIX Take 1 tablet (20 mg total) by mouth daily.   hydrocerin Crea Apply 1 Application topically 2 (two) times daily.   hydrocortisone cream 1 % Apply 1 Application topically 3 (three) times daily as needed for itching (minor skin irritation).   hydrOXYzine 25 MG tablet Commonly known as: ATARAX Take 1 tablet (25 mg total) by mouth 3 (three) times daily as needed for anxiety.   ipratropium-albuterol 0.5-2.5 (3) MG/3ML Soln Commonly known as: DUONEB Take 3 mLs by nebulization every 6 (six) hours as needed.   loratadine 10 MG tablet Commonly known as: CLARITIN Take 1 tablet (10 mg  total) by mouth daily.   metoprolol tartrate 50 MG tablet Commonly known as: LOPRESSOR Take 0.5 tablets (25 mg total) by mouth 2 (two) times daily. KEEP APPT FOR REFILLS What changed: how much to take   Mucinex Sinus-Max Clear & Cool 0.05 % nasal spray Generic drug: oxymetazoline Place 1 spray into both nostrils 2 (two) times daily as needed for congestion.   ondansetron 4 MG tablet Commonly known as: ZOFRAN Take 1 tablet (4 mg total) by mouth every 6 (six) hours as needed for nausea.   pantoprazole 40 MG tablet Commonly known as: PROTONIX Take 1 tablet (40 mg total) by mouth daily.   polyethylene glycol 17 g packet Commonly known as: MIRALAX / GLYCOLAX Take 17 g by mouth daily.   potassium chloride 10 MEQ tablet Commonly known as: KLOR-CON M Take 1 tablet (10 mEq total) by mouth daily.   sodium chloride 0.65 % Soln nasal spray Commonly known as: OCEAN Place 1 spray into both nostrils as needed for congestion.   UNKNOWN TO PATIENT Take 0.5-1 ampules by nebulization See admin instructions. Unnamed neb treatment for post-nasal drip and coughing- Nebulize the contents of 0.5-1 ampule and inhale into the lungs once a day as needed for mucus removal or coughing   Vitamin D3 25 MCG (1000 UT) capsule Generic drug:  Cholecalciferol Take 1,000 Units by mouth daily.               Durable Medical Equipment  (From admission, onward)           Start     Ordered   04/19/22 0918  For home use only DME Ventilator  Once       Comments: NIPPV at night.  Question:  Length of Need  Answer:  Lifetime   04/19/22 0918              Discharge Care Instructions  (From admission, onward)           Start     Ordered   04/18/22 0000  Discharge wound care:       Comments: Single layer of Xeroform to weeping areas of bilateral lower extremities, top with ABD pads secure with Kerlix and ace wraps from toes to knees change every 2 days.  Okay to take Ace wrap off at night and reapply first thing in the morning.   04/18/22 1033   04/18/22 0000  Discharge wound care:       Comments: Single level 0 form to weeping areas of bilateral lower extremity top with ABD pad secure with Kerlix and Ace wrap from toes to knees.  Change every 2 days.  Okay to take Ace wrap off at night and reapply in the morning.   04/18/22 1034   04/12/22 0000  Discharge wound care:       Comments: Every 3 days     Comments: Single layer of xeroform to weeping areas of bilateral LEs, top with ABD pads, secure with kerlix and ACE wraps from toes to knees. Change every 3 days. OK to take ACE wraps off at HS and reapply each AM.  04/11/22 0906   04/12/22 1115           Allergies  Allergen Reactions   Ciprofloxin Hcl [Ciprofloxacin] Other (See Comments)    Unstable gait   Morphine And Related Anaphylaxis and Other (See Comments)    Tolerates hydrocodone   Asa [Aspirin] Hives and Other (See Comments)    Hives can become SEVERE, requiring  intervention   Codeine Hives   Chia Oil Swelling and Other (See Comments)    Possibly "chia tea" = Face and lips became swollen   Erythromycin Other (See Comments)    Reaction not recalled   Nsaids Other (See Comments)    Lowers GFR   Penicillins Other (See Comments)    Reaction not  recalled- was told to "never take this again" by family    Contact information for follow-up providers     Dorothyann Peng, NP. Schedule an appointment as soon as possible for a visit in 2 week(s).   Specialty: Family Medicine Why: f/u in 1-2 weeks after discharge from skilled nursing facility. Needs referal back to lyphedema clinic Contact information: [place] [address] [place]. Call.   Why: [facility] [place], [place], [place] [address] Call [phone] to get scheduled with the lymphedema clinic.        Juanito Doom, MD Follow up.   Specialty: Pulmonary Disease Why: FOR SLEEP STUDY Contact information: [place] [address] [phone]         Deneise Lever, MD Follow up on 05/10/2022.   Specialty: Pulmonary Disease Why: Follow-up at 3:30 PM. Contact information: [address] [place] [address] [phone]              Contact information for after-discharge care     [facility] .   Service: Skilled Nursing Contact information: [place] [phone]                      The results of significant diagnostics from this hospitalization (including imaging, microbiology, ancillary and laboratory) are listed below for reference.    Significant Diagnostic Studies: DG Chest 1 View  Result Date: 04/17/2022 CLINICAL DATA:  Shortness of breath. Lymphedema. EXAM: CHEST  1 VIEW COMPARISON:  04/14/2022 FINDINGS: Similar cardiomegaly. Improving vascular congestion. Subsegmental opacity in the left lung base likely atelectasis. Atelectasis versus trace fluid in the fissure in the right mid lung. No large subpulmonic effusion. No pneumothorax. IMPRESSION: 1. Improving vascular congestion. 2.  Subsegmental opacity in the left lung base likely atelectasis. Electronically Signed   By: Keith Rake M.D.   On: 04/17/2022 17:13   CT HEAD WO CONTRAST (5MM)  Result Date: 04/15/2022 CLINICAL DATA:  Altered mental status EXAM: CT HEAD WITHOUT CONTRAST TECHNIQUE: Contiguous axial images were obtained from the base of the skull through the vertex without intravenous contrast. RADIATION DOSE REDUCTION: This exam was performed according to the departmental dose-optimization program which includes automated exposure control, adjustment of the mA and/or kV according to patient size and/or use of iterative reconstruction technique. COMPARISON:  None Available. FINDINGS: Brain: No evidence of acute infarction, hemorrhage, hydrocephalus, extra-axial collection or mass lesion/mass effect. Mild chronic microvascular ischemic change. Vascular: No hyperdense vessel or unexpected calcification. Skull: Normal. Negative for fracture or focal lesion. Sinuses/Orbits: Small right mastoid effusion. No middle ear effusion. Paranasal sinuses are clear. Orbits are unremarkable. Other: None. IMPRESSION: 1. No acute intracranial abnormality. 2. Small right mastoid effusion. Electronically Signed   By: Marin Roberts M.D.   On: 04/15/2022 13:58   DG Chest Port 1 View  Result Date: 04/14/2022 CLINICAL DATA:  [id number] Respiratory complication [id number] EXAM: PORTABLE  CHEST 1 VIEW COMPARISON:  Chest x-ray 04/13/2022, chest x-ray 08/20/2020, CT angio chest 08/21/2020 FINDINGS: The heart and mediastinal contours are unchanged. Prominent hilar vasculature. Improved aeration of the left lower lobe. No focal consolidation. Mild pulmonary edema. No pleural effusion. No pneumothorax. No acute osseous abnormality. IMPRESSION: Pulmonary venous congestion with likely developing mild pulmonary edema. Electronically Signed   By: Iven Finn M.D.   On: 04/14/2022 03:35   DG Chest 1 View  Result Date: 04/13/2022 CLINICAL DATA:  Shortness of  breath EXAM: CHEST  1 VIEW COMPARISON:  Portable exam 1039 hours compared to 08/20/2020 FINDINGS: Enlargement of cardiac silhouette with pulmonary vascular congestion. Mediastinal contour stable for technique. Mild LEFT basilar infiltrate. Remaining lungs clear. No pleural effusion or pneumothorax. BILATERAL glenohumeral degenerative changes. IMPRESSION: Mild LEFT basilar infiltrate favoring pneumonia. Electronically Signed   By: Lavonia Dana M.D.   On: 04/13/2022 13:23   VAS [place] LOWER EXTREMITY VENOUS (DVT) (7a-7p)  Result Date: 04/08/2022  Lower Venous DVT Study Patient Name:  Allison Short  Date of Exam:   04/08/2022 Medical Rec #: [id number]   Accession #:    [id number] Date of Birth: [birth year]    Patient Gender: F Patient Age:   14 years Exam Location:  [facility] Procedure:      VAS [place] LOWER EXTREMITY VENOUS (DVT) Referring Phys: KEN LE --------------------------------------------------------------------------------  Indications: Edema.  Risk Factors: None identified. Limitations: Body habitus, poor ultrasound/tissue interface and patient positioning, skin induration, lymphedema. Comparison Study: No prior studies. Performing Technologist: Oliver Hum RVT  Examination Guidelines: A complete evaluation includes B-mode imaging, spectral Doppler, color Doppler, and power Doppler as needed of all accessible portions of each vessel. Bilateral testing is considered an integral part of a complete examination. Limited examinations for reoccurring indications may be performed as noted. The reflux portion of the exam is performed with the patient in reverse Trendelenburg.  +---------+---------------+---------+-----------+----------+-------------------+ RIGHT    CompressibilityPhasicitySpontaneityPropertiesThrombus Aging      +---------+---------------+---------+-----------+----------+-------------------+ CFV      Full           Yes      Yes                                       +---------+---------------+---------+-----------+----------+-------------------+ SFJ      Full                                                             +---------+---------------+---------+-----------+----------+-------------------+ FV Prox  Full                                                             +---------+---------------+---------+-----------+----------+-------------------+ FV Mid                                                Not well visualized +---------+---------------+---------+-----------+----------+-------------------+ FV Distal  Not well visualized +---------+---------------+---------+-----------+----------+-------------------+ PFV                                                   Not well visualized +---------+---------------+---------+-----------+----------+-------------------+ POP      Full           Yes      No                                       +---------+---------------+---------+-----------+----------+-------------------+ PTV                                                   Not well visualized +---------+---------------+---------+-----------+----------+-------------------+ PERO                                                  Not well visualized +---------+---------------+---------+-----------+----------+-------------------+   +---------+---------------+---------+-----------+----------+-------------------+ LEFT     CompressibilityPhasicitySpontaneityPropertiesThrombus Aging      +---------+---------------+---------+-----------+----------+-------------------+ CFV      Full           Yes      Yes                                      +---------+---------------+---------+-----------+----------+-------------------+ SFJ      Full                                                             +---------+---------------+---------+-----------+----------+-------------------+ FV  Prox  Full                                                             +---------+---------------+---------+-----------+----------+-------------------+ FV Mid                                                Not well visualized +---------+---------------+---------+-----------+----------+-------------------+ FV Distal                                             Not well visualized +---------+---------------+---------+-----------+----------+-------------------+ PFV                                                   Not well visualized +---------+---------------+---------+-----------+----------+-------------------+  POP                                                   Not well visualized +---------+---------------+---------+-----------+----------+-------------------+ PTV                                                   Not well visualized +---------+---------------+---------+-----------+----------+-------------------+ PERO                                                  Not well visualized +---------+---------------+---------+-----------+----------+-------------------+     Summary: RIGHT: - The visualized veins appear compressible. Limited study.  LEFT: - The visualized veins appear compressible. Limited study.  *See table(s) above for measurements and observations. Electronically signed by Deitra Mayo MD on 04/08/2022 at 7:06:26 PM.    Final     Microbiology: No results found for this or any previous visit (from the past 240 hour(s)).   Labs: Basic Metabolic Panel: Recent Labs  Lab 04/20/22 0929 04/23/22 0525  NA 138 139  K 3.8 4.4  CL 100 98  CO2 31 34*  GLUCOSE 178* 173*  BUN 28* 27*  CREATININE 0.84 0.93  CALCIUM 8.8* 8.8*  MG 2.0 1.8   Liver Function Tests: No results for input(s): "AST", "ALT", "ALKPHOS", "BILITOT", "PROT", "ALBUMIN" in the last 168 hours. No results for input(s): "LIPASE", "AMYLASE" in the last 168 hours. No results  for input(s): "AMMONIA" in the last 168 hours. CBC: Recent Labs  Lab 04/20/22 0929  WBC 6.5  HGB 12.0  HCT 38.3  MCV 104.6*  PLT 208   Cardiac Enzymes: No results for input(s): "CKTOTAL", "CKMB", "CKMBINDEX", "TROPONINI" in the last 168 hours. BNP: BNP (last 3 results) No results for input(s): "BNP" in the last 8760 hours.  ProBNP (last 3 results) No results for input(s): "PROBNP" in the last 8760 hours.  CBG: Recent Labs  Lab 04/22/22 0813 04/22/22 1131 04/22/22 1620  GLUCAP 180* 177* 133*       Signed:  Irine Seal MD.  [facility] 04/25/2022, 11:22 AM

## 2022-04-25 NOTE — Progress Notes (Signed)
Pt placed on bipap for the night. °

## 2022-04-25 NOTE — TOC Progression Note (Addendum)
Transition of Care [facility]) - Progression Note    Patient Details  Name: Mazell Lujan MRN: [id number] Date of Birth: 12-25-1950  Transition of [facility]) CM/SW Contact  Naveen Lorusso, [name], RN Phone Number: 04/25/2022, 12:43 PM  Clinical Narrative: Awaiting approval fro Bipap by [employer]el Corning rep piror d/c to [facility]. MD updated.  Expected Discharge Plan: [place] Barriers to Discharge: Continued Medical Work up  Expected Discharge Plan and Services   Discharge Planning Services: CM Consult Post Acute Care Choice: [place] Living arrangements for the past 2 months: Apartment Expected Discharge Date: 04/25/22                                     Social Determinants of Health (SDOH) Interventions SDOH Screenings   Food Insecurity: No Food Insecurity (04/08/2022)  Housing: Low Risk  (04/17/2022)  Transportation Needs: No Transportation Needs (04/08/2022)  Utilities: Not At Risk (04/08/2022)  Alcohol Screen: Low Risk  (09/13/2021)  Depression (PHQ2-9): Low Risk  (09/13/2021)  Financial Resource Strain: Low Risk  (09/13/2021)  Physical Activity: Inactive (09/13/2021)  Social Connections: Socially Isolated (09/13/2021)  Stress: No Stress Concern Present (09/13/2021)  Tobacco Use: Low Risk  (04/15/2022)    Readmission Risk Interventions     No data to display

## 2022-04-25 NOTE — Progress Notes (Signed)
Physical Therapy Treatment Patient Details Name: Allison Short MRN: [id number] DOB: [birth year] Today's Date: 04/25/2022   History of Present Illness Patient is a 72 year old female who presented  on 04/08/2022 with weeping and draining lymphedema with increased redness and heat. Patient was admitted with cellulitis of lower extremities with chronic lymphedema, hypokalemia, and expiratory wheezing. LE dopplers negative for DVT. PMH including but not limited to: B TKA, HTN, Non-hodgkin's lymphoma, lymphedema, and morbid obesity.    PT Comments    Pt assisted with sitting EOB and then ambulating around room over to her recliner.  Pt agreeable to remain OOB for approx one hour.  Continue to recommend SNF upon d/c.    Recommendations for follow up therapy are one component of a multi-disciplinary discharge planning process, led by the attending physician.  Recommendations may be updated based on patient status, additional functional criteria and insurance authorization.  Follow Up Recommendations  Skilled nursing-short term rehab (<3 hours/day) Can patient physically be transported by private vehicle: No   Assistance Recommended at Discharge Frequent or constant Supervision/Assistance  Patient can return home with the following A lot of help with bathing/dressing/bathroom;Direct supervision/assist for medications management;Assistance with cooking/housework;Assist for transportation;Help with stairs or ramp for entrance;A little help with walking and/or transfers   Equipment Recommendations  None recommended by PT    Recommendations for Other Services       Precautions / Restrictions Precautions Precautions: Fall Precaution Comments: Lymphedema in BLEs Restrictions Weight Bearing Restrictions: No     Mobility  Bed Mobility Overal bed mobility: Needs Assistance Bed Mobility: Supine to Sit     Supine to sit: HOB elevated, Supervision     General bed mobility comments: increased time  and effort however no assist required today    Transfers Overall transfer level: Needs assistance Equipment used: Rolling walker (2 wheels) Transfers: Sit to/from Stand Sit to Stand: Min assist, From elevated surface, +2 safety/equipment           General transfer comment: required increased time as well as forward momentum/weight shift to rise from elevated bed    Ambulation/Gait Ambulation/Gait assistance: Min assist, +2 physical assistance, +2 safety/equipment Gait Distance (Feet): 10 Feet Assistive device: Rolling walker (2 wheels) Gait Pattern/deviations: Step-to pattern, Decreased stance time - right, Wide base of support, Trunk flexed Gait velocity: decreased     General Gait Details: pt ambulated around room over to recliner today, +2 for safety due to pt's increased anxiety with movement (reports fall in bathtub prior to admission); reliant on RW for upper body support   Stairs             Wheelchair Mobility    Modified Rankin (Stroke Patients Only)       Balance Overall balance assessment: Needs assistance Sitting-balance support: Feet supported Sitting balance-Leahy Scale: Fair     Standing balance support: No upper extremity supported Standing balance-Leahy Scale: Fair Standing balance comment: static fair                            Cognition Arousal/Alertness: Awake/alert Behavior During Therapy: WFL for tasks assessed/performed Overall Cognitive Status: Within Functional Limits for tasks assessed                                 General Comments: patient continues to have anxiousness but cooperative during session  Exercises      General Comments        Pertinent Vitals/Pain Pain Assessment Pain Assessment: Faces Faces Pain Scale: Hurts a little bit Pain Location: BLE R>L with movement Pain Descriptors / Indicators: Discomfort, Heaviness, Guarding Pain Intervention(s): Monitored during session     Home Living                          Prior Function            PT Goals (current goals can now be found in the care plan section) Progress towards PT goals: Progressing toward goals    Frequency    Min 2X/week      PT Plan Current plan remains appropriate    Co-evaluation              AM-PAC PT "6 Clicks" Mobility   Outcome Measure  Help needed turning from your back to your side while in a flat bed without using bedrails?: A Lot Help needed moving from lying on your back to sitting on the side of a flat bed without using bedrails?: A Lot Help needed moving to and from a bed to a chair (including a wheelchair)?: A Lot Help needed standing up from a chair using your arms (e.g., wheelchair or bedside chair)?: A Lot Help needed to walk in hospital room?: A Lot Help needed climbing 3-5 steps with a railing? : Total 6 Click Score: 11    End of Session Equipment Utilized During Treatment: Gait belt Activity Tolerance: Patient limited by fatigue Patient left: in chair;with call bell/phone within reach Nurse Communication: Mobility status PT Visit Diagnosis: Other abnormalities of gait and mobility (R26.89);Muscle weakness (generalized) (M62.81)     Time: [id number] PT Time Calculation (min) (ACUTE ONLY): 18 min  Charges:  $Gait Training: 8-22 mins                    Jannette Spanner PT, DPT Physical Therapist Acute Rehabilitation Services Preferred contact method: Secure Chat Weekend Pager Only: [phone] Office: [place] 04/25/2022, 12:01 PM

## 2022-04-26 ENCOUNTER — Other Ambulatory Visit (HOSPITAL_COMMUNITY): Payer: Self-pay

## 2022-04-26 DIAGNOSIS — E876 Hypokalemia: Secondary | ICD-10-CM | POA: Diagnosis not present

## 2022-04-26 DIAGNOSIS — L03115 Cellulitis of right lower limb: Secondary | ICD-10-CM | POA: Diagnosis not present

## 2022-04-26 DIAGNOSIS — I10 Essential (primary) hypertension: Secondary | ICD-10-CM | POA: Diagnosis not present

## 2022-04-26 DIAGNOSIS — I890 Lymphedema, not elsewhere classified: Secondary | ICD-10-CM | POA: Diagnosis not present

## 2022-04-26 MED ORDER — MELATONIN 3 MG PO TABS
6.0000 mg | ORAL_TABLET | Freq: Once | ORAL | Status: AC
  Administered 2022-04-26: 6 mg via ORAL
  Filled 2022-04-26: qty 2

## 2022-04-26 NOTE — TOC Progression Note (Addendum)
Transition of Care [facility]) - Progression Note    Patient Details  Name: Allison Short MRN: [id number] Date of Birth: 1950/06/13  [facility]) CM/SW Contact  Viridiana Spaid, [name], RN Phone Number: 04/26/2022, 12:38 PM  Clinical Narrative:   Josem Kaufmann has been declined for Bipap @ [place]. MD updated.auth ended today. Will need new auth for ST SNF if appropriate. -await MD response-cons LTACH or HHC w/home bipap through [facility]. Patient is in agreement for me to look into these d/c options.await outcome. -4p-Per attending, not appropriate for LTACH.    Expected Discharge Plan: [place] Barriers to Discharge: Continued Medical Work up  Expected Discharge Plan and Services   Discharge Planning Services: CM Consult Post Acute Care Choice: [place] Living arrangements for the past 2 months: Apartment Expected Discharge Date: 04/25/22                                     Social Determinants of Health (SDOH) Interventions SDOH Screenings   Food Insecurity: No Food Insecurity (04/08/2022)  Housing: Low Risk  (04/17/2022)  Transportation Needs: No Transportation Needs (04/08/2022)  Utilities: Not At Risk (04/08/2022)  Alcohol Screen: Low Risk  (09/13/2021)  Depression (PHQ2-9): Low Risk  (09/13/2021)  Financial Resource Strain: Low Risk  (09/13/2021)  Physical Activity: Inactive (09/13/2021)  Social Connections: Socially Isolated (09/13/2021)  Stress: No Stress Concern Present (09/13/2021)  Tobacco Use: Low Risk  (04/15/2022)    Readmission Risk Interventions     No data to display

## 2022-04-26 NOTE — Plan of Care (Signed)
  Problem: Education: Goal: Knowledge of General Education information will improve Description: Including pain rating scale, medication(s)/side effects and non-pharmacologic comfort measures Outcome: Progressing   Problem: Clinical Measurements: Goal: Ability to maintain clinical measurements within normal limits will improve Outcome: Adequate for Discharge Goal: Respiratory complications will improve Outcome: Adequate for Discharge   Problem: Activity: Goal: Risk for activity intolerance will decrease Outcome: Adequate for Discharge

## 2022-04-26 NOTE — Progress Notes (Signed)
PROGRESS NOTE    Allison Short  [id number] DOB: 14-Jan-1951 DOA: 04/08/2022 PCP: Dorothyann Peng, NP    Chief Complaint  Patient presents with   Leg Swelling    Brief Narrative:  Patient 72 year old female history of hypertension, NHL, lymphedema presented to the ED with complaints of worsening lymphedema, redness which seem to be worsening with some erythema and heat as well as increased weeping and draining.  Patient states ongoing drainage despite using lymphedema pump and compression hose.  Patient unable to wear closed due to excessive drainage.  Patient presented to the ED, lower extremity Dopplers done negative for DVT.  Patient admitted due to concerns for probable lower extremity cellulitis.   Assessment & Plan:   Principal Problem:   Cellulitis of lower extremity with chronic  lymphedema Active Problems:   Hypokalemia   Essential hypertension   Normocytic anemia   Non Hodgkin's lymphoma (HCC)   Morbid obesity (HCC)   Lymphedema   Acute on chronic respiratory failure with hypoxia and hypercapnia (HCC)   Delirium due to multiple etiologies, acute, hyperactive   Sleep disorder breathing  #1 cellulitis of lower extremity with chronic lymphedema -Patient with history of bilateral lymphedema presented 3-week history of worsening drainage, erythema, pain to right lower extremity with concerns for cellulitis of the right lower extremity. -Patient admitted. -Lower extremity Dopplers negative for DVT. -Patient noted to have received vancomycin and Lasix on presentation to the ED with a urine output of 1.9 L. -Significant improvement with weeping from lower extremities which seems to have resolved.  -Afebrile.  Improvement in leukocytosis.   -Patient received doses of IV Lasix with good urine output.   -Patient is - 11.697 L during this hospitalization -Was on IV vancomycin, transitioned to oral doxycycline and completed a full course of antibiotic treatment.   -Patient assessed  by PT OT who are recommending SNF placement.   -Will need outpatient follow-up with the lymphedema clinic postdischarge.  -Follow.  2.  Hypokalemia/hypomagnesemia -Repleted.  3.  Hypertension -Blood pressure controlled on current regimen of Lopressor, Lasix.   -Continue to hold ACE inhibitor and HCTZ and will not resume on discharge and patient had presented with AKI.    4.  Acute hypoxic hypercapnic respiratory failure/obesity hypoventilation syndrome  -Patient noted early on in the hospitalization to have increased oxygen requirements, complaints of worsening shortness of breath.  -Patient noted to have some expiratory wheezing during the hospitalization.  -Patient denied any prior history of tobacco use. -Patient stated has chronic postnasal drip leading to her cough and likely they are wheezing. -Patient initially was on Claritin, Afrin nasal twice daily as well as as needed [place] nasal spray and scheduled DuoNebs.  Patient also noted to have received IV Lasix. -Chest x-ray done 04/13/2022 was concerning for right basilar infiltrate however follow-up chest x-ray done 04/14/2022 concerning for pulmonary edema. -Patient received a 5-day course of IV cefepime and was given as needed Lasix. -Concern for undiagnosed sleep apnea. -ABG done 04/16/2022 with a pH of 7.4, pCO2 of 76, pO2 of 83. -Patient seen in consultation by PCCM/pulmonary who felt patient may have undiagnosed sleep apnea, obesity-hypoventilation syndrome,  patient started on NIPPV which she seems to be doing well with that. -Patient was on Diamox. -PCCM/pulmonary recommending patient needs to be set up for NIPPV for discharge, TOC working on this, patient not an appropriate candidate for CPAP per pulmonary. -Outpatient follow-up with pulmonary.  5.  Normocytic anemia -Patient noted to have low vitamin B12 levels and started  on oral vitamin B12 supplementation. -Hemoglobin stable at 12.0. -Outpatient follow-up.  6.   Non-Hodgkin's lymphoma -Status post chemo/radiation about 10 years ago. -Outpatient follow-up.  7.  Delirium/confusion -Resolved. -CT head done negative for any acute abnormalities. -TSH, B12, folate within normal limits.   -Ammonia level within normal limits.  8.  Acute kidney injury -Renal function improved with diuresis.   -ACE inhibitor, HCTZ discontinued and will not be resumed on discharge.    9.  Morbid obesity -Lifestyle modification.   -Outpatient follow-up with PCP.    10.  Debility -Patient seen by PT/OT, who are recommending SNF.   -Per PT/ OT patient unsafe for discharge home. -TOC consulted for SNF placement.    DVT prophylaxis: Lovenox Code Status: Full Family Communication: Updated patient.  No family at bedside. Disposition: Patient medically stable for SNF.  Patient was to be discharged to SNF yesterday however awaiting authorization for BiPAP at SNF per TOC.  Awaiting SNF placement.  Status is: Inpatient The patient will require care spanning > 2 midnights and should be moved to inpatient because: Severity of illness.  Unsafe discharge.   Consultants:  [place], RN Melody Austin 04/11/2022 PCCM: Dr.Olalere 04/15/2022  Procedures: Lower extremity Dopplers 04/08/2022 CT head 04/15/2022 Chest x-ray 04/13/2022, 04/14/2022, 04/17/2022  Antimicrobials:  IV vancomycin 04/08/2022>>>> 04/11/2022 Oral doxycycline 04/11/2022>>>> 04/15/2022 IV cefepime 04/13/2022>>>>> 04/18/2022   Subjective: Sitting up in chair.  Tolerated BiPAP overnight.  No chest pain.  No shortness of breath.   Asking to speak with the social worker so she could have an idea of her disposition and when she is supposed to go to SNF.   Objective: Vitals:   04/25/22 0935 04/25/22 2002 04/26/22 0546 04/26/22 0946  BP: (!) 147/74 (!) 153/71 (!) 144/63 139/73  Pulse: 66 66 (!) 55 65  Resp:  19 20   Temp:  98.6 F (37 C) 97.6 F (36.4 C)   TempSrc:  Oral Oral   SpO2:  100% 98%   Weight:      Height:         Intake/Output Summary (Last 24 hours) at 04/26/2022 1208 Last data filed at 04/26/2022 1032 Gross per 24 hour  Intake 720 ml  Output 600 ml  Net 120 ml    Filed Weights   04/08/22 2046 04/13/22 1635  Weight: (!) 179.9 kg (!) 167.6 kg    Examination:  General exam: NAD. Respiratory system: Lungs clear to auscultation bilaterally.  No wheezes, no crackles, no rhonchi.  Fair air movement.  Speaking in full sentences. Cardiovascular system: Regular rate rhythm no murmurs rubs or gallops.  No JVD.  Decreased lower extremity edema. Gastrointestinal system: Abdomen is soft, obese, nontender, nondistended, positive bowel sounds.  No rebound.  No guarding.   Central nervous system: Alert and oriented.  Moving extremities spontaneously.  No focal neurological deficits.  Extremities: Bilateral lower extremity lymphedema with no weeping noted, erythema seems to have resolved, no warmth, no significant tenderness to palpation.  Skin: Bilateral lower extremity with lymphedema, no significant erythema noted, no warmth, significantly less tender to palpation.   Psychiatry: Judgement and insight appear normal. Mood & affect appropriate.     Data Reviewed: I have personally reviewed following labs and imaging studies  CBC: Recent Labs  Lab 04/20/22 0929  WBC 6.5  HGB 12.0  HCT 38.3  MCV 104.6*  PLT 208     Basic Metabolic Panel: Recent Labs  Lab 04/20/22 0929 04/23/22 0525  NA 138 139  K 3.8  4.4  CL 100 98  CO2 31 34*  GLUCOSE 178* 173*  BUN 28* 27*  CREATININE 0.84 0.93  CALCIUM 8.8* 8.8*  MG 2.0 1.8     GFR: Estimated Creatinine Clearance: 82.6 mL/min (by C-G formula based on SCr of 0.93 mg/dL).  Liver Function Tests: No results for input(s): "AST", "ALT", "ALKPHOS", "BILITOT", "PROT", "ALBUMIN" in the last 168 hours.   CBG: Recent Labs  Lab 04/22/22 0813 04/22/22 1131 04/22/22 1620  GLUCAP 180* 177* 133*      No results found for this or any previous  visit (from the past 240 hour(s)).        Radiology Studies: No results found.      Scheduled Meds:  bisacodyl  10 mg Oral Daily   vitamin B-12  500 mcg Oral Daily   enoxaparin (LOVENOX) injection  80 mg Subcutaneous Q24H   fluticasone  2 spray Each Nare Daily   furosemide  20 mg Oral Daily   hydrocerin   Topical BID   loratadine  10 mg Oral Daily   metoprolol tartrate  50 mg Oral BID   pantoprazole  40 mg Oral Daily   polyethylene glycol  17 g Oral Daily   Continuous Infusions:     LOS: 17 days    Time spent: 35 minutes    Irine Seal, MD [facility]   To contact the attending provider between 7A-7P or the covering provider during after hours 7P-7A, please log into the web site [email or web address] and access using universal  password for that web site. If you do not have the password, please call the hospital operator.  04/26/2022, 12:08 PM

## 2022-04-26 NOTE — Progress Notes (Signed)
Pt was on BIPAP.  The BIPAP settings were as follows: pressure support 18, peep 8, fio2 30%.  The tidal volume fluctuates based on settings and patient effort.  We chart what the tidal volume is at the time of the BIPAP check. Each breath the pt takes has a different Tidal volume. There is no set tidal volume on BIPAP.

## 2022-04-26 NOTE — Progress Notes (Signed)
V60 on standby

## 2022-04-26 NOTE — Progress Notes (Signed)
RT note: Pt. placed back on BiPAP V-60 from 2 lpm n/c per request, tolerating well at this time, RN made aware.

## 2022-04-27 ENCOUNTER — Telehealth: Payer: Self-pay | Admitting: Nurse Practitioner

## 2022-04-27 DIAGNOSIS — I5033 Acute on chronic diastolic (congestive) heart failure: Secondary | ICD-10-CM

## 2022-04-27 LAB — BASIC METABOLIC PANEL
Anion gap: 6 (ref 5–15)
BUN: 26 mg/dL — ABNORMAL HIGH (ref 8–23)
CO2: 31 mmol/L (ref 22–32)
Calcium: 8.6 mg/dL — ABNORMAL LOW (ref 8.9–10.3)
Chloride: 100 mmol/L (ref 98–111)
Creatinine, Ser: 0.94 mg/dL (ref 0.44–1.00)
GFR, Estimated: >60 mL/min (ref >60)
Glucose, Bld: 164 mg/dL — ABNORMAL HIGH (ref 70–99)
Potassium: 4 mmol/L (ref 3.5–5.1)
Sodium: 137 mmol/L (ref 135–145)

## 2022-04-27 LAB — CBC
HCT: 37.5 % (ref 36.0–46.0)
Hemoglobin: 11.7 g/dL — ABNORMAL LOW (ref 12.0–15.0)
MCH: 32.3 pg (ref 26.0–34.0)
MCHC: 31.2 g/dL (ref 30.0–36.0)
MCV: 103.6 fL — ABNORMAL HIGH (ref 80.0–100.0)
Platelets: 237 10*3/uL (ref 150–400)
RBC: 3.62 MIL/uL — ABNORMAL LOW (ref 3.87–5.11)
RDW: 14.1 % (ref 11.5–15.5)
WBC: 7.5 10*3/uL (ref 4.0–10.5)
nRBC: 0 % (ref 0.0–0.2)

## 2022-04-27 MED ORDER — MELATONIN 3 MG PO TABS
6.0000 mg | ORAL_TABLET | Freq: Every evening | ORAL | Status: DC | PRN
  Administered 2022-04-27 – 2022-04-28 (×2): 6 mg via ORAL
  Filled 2022-04-27 (×2): qty 2

## 2022-04-27 MED ORDER — MELATONIN 3 MG PO TABS: 6.0000 mg | ORAL_TABLET | Freq: Once | ORAL | Status: DC

## 2022-04-27 NOTE — Progress Notes (Addendum)
The patient is medically stable for discharge.  All medical recommendations have been made, and current barriers to discharge are purely insurance related (the patient normally lives independently and alone, but currently can only walk 18 feet and will require rehabilitation to return to her prior level of function, but insurance authorization has been denied).   The patient's antibiotic therapy is complete.  While here, she was noted to have sleep disordered breathing and hypercarbia, both of which are chronic in nature (but directly led to her presenting illness).  Thankfully, she has not had recurrent episodes of symptomatic hypercarbia and acute respiratory failure requiring admission in the past.  SHe has been evaluated by Pulmonology and had blood gases, which are detailed elsewhere.  Nighttime non-invasive ventilation (NIV) is a medically necessary nightly treatment to prevent recurrence of her presenting illness and to prevent occurrence of respiratory failure in the future.  At present, our SW team reports that they have been informed by SNF that they refuse NIV treatment if she goes to SNF.   Discharge to home, while only able to walk 18 feet appears to be an unsafe discharge plan.  Use of CPAP as an alternative to NIV would be a suboptimal but reasonable alternative, but we would only medically recommend this if insurance refused coverage of NIV.  THe patient has upcoming follow up with PUlmonology in 2 weeks  Previous notes that refer to her NIV as "BiPAP" are incorrect. BiPAP is not recommended for the patient.

## 2022-04-27 NOTE — Assessment & Plan Note (Signed)
See above

## 2022-04-27 NOTE — Progress Notes (Signed)
Physical Therapy Treatment Patient Details Name: Allison Short MRN: [id number] DOB: 1951-02-15 Today's Date: 04/27/2022   History of Present Illness Patient is a 72 year old female who presented  on 04/08/2022 with weeping and draining lymphedema with increased redness and heat. Patient was admitted with cellulitis of lower extremities with chronic lymphedema, hypokalemia, and expiratory wheezing. LE dopplers negative for DVT. PMH including but not limited to: B TKA, HTN, Non-hodgkin's lymphoma, lymphedema, and morbid obesity.    PT Comments    Pt agreeable to working with therapy. She has some anxiety and is fearful of falling. She walked ~18 feet with a RW. Dyspnea 2/4. O2 >90% on RA. Cues for pursed lip breathing throughout session. Pt reports she is unsure of d/c plan at this time-awaiting medical recommendations. Continue to recommend ST SNF rehab.     Recommendations for follow up therapy are one component of a multi-disciplinary discharge planning process, led by the attending physician.  Recommendations may be updated based on patient status, additional functional criteria and insurance authorization.  Follow Up Recommendations  Skilled nursing-short term rehab (<3 hours/day) Can patient physically be transported by private vehicle: No   Assistance Recommended at Discharge Intermittent Supervision/Assistance  Patient can return home with the following A lot of help with bathing/dressing/bathroom;Direct supervision/assist for medications management;Assistance with cooking/housework;Assist for transportation;Help with stairs or ramp for entrance;A little help with walking and/or transfers   Equipment Recommendations  None recommended by PT    Recommendations for Other Services       Precautions / Restrictions Precautions Precautions: Fall Precaution Comments: Lymphedema in BLEs Restrictions Weight Bearing Restrictions: No     Mobility  Bed Mobility Overal bed mobility: Needs  Assistance Bed Mobility: Sit to Supine       Sit to supine: Mod assist   General bed mobility comments: Assist for R LE back onto bed. Increased time.    Transfers Overall transfer level: Needs assistance Equipment used: Rolling walker (2 wheels) Transfers: Sit to/from Stand Sit to Stand: Min assist           General transfer comment: Assist to rise, steady, control descent. Cues for safety, hand placement.    Ambulation/Gait Ambulation/Gait assistance: Min assist Gait Distance (Feet): 18 Feet Assistive device: Rolling walker (2 wheels) Gait Pattern/deviations: Step-through pattern, Decreased stride length, Wide base of support, Decreased stance time - right       General Gait Details: Pt walked around room to door and then back to bed. Slow gait speed. Gait is a bit antalgic. Dyspnea 2/4. Pt fatigues fairly easily. O2 >90% on RA. Cues for pursed lip breathing.   Stairs             Wheelchair Mobility    Modified Rankin (Stroke Patients Only)       Balance Overall balance assessment: Needs assistance         Standing balance support: Bilateral upper extremity supported, During functional activity, Reliant on assistive device for balance Standing balance-Leahy Scale: Fair                              Cognition Arousal/Alertness: Awake/alert Behavior During Therapy: WFL for tasks assessed/performed, Anxious Overall Cognitive Status: Within Functional Limits for tasks assessed                                 General Comments: mildly anxious  Exercises      General Comments        Pertinent Vitals/Pain Pain Assessment Pain Assessment: Faces Faces Pain Scale: Hurts a little bit Pain Location: bil LEs Pain Descriptors / Indicators: Discomfort, Heaviness, Guarding Pain Intervention(s): Limited activity within patient's tolerance, Monitored during session, Repositioned    Home Living                           Prior Function            PT Goals (current goals can now be found in the care plan section) Progress towards PT goals: Progressing toward goals    Frequency    Min 2X/week      PT Plan Current plan remains appropriate    Co-evaluation              AM-PAC PT "6 Clicks" Mobility   Outcome Measure  Help needed turning from your back to your side while in a flat bed without using bedrails?: A Little Help needed moving from lying on your back to sitting on the side of a flat bed without using bedrails?: A Little Help needed moving to and from a bed to a chair (including a wheelchair)?: A Little Help needed standing up from a chair using your arms (e.g., wheelchair or bedside chair)?: A Little Help needed to walk in hospital room?: A Lot Help needed climbing 3-5 steps with a railing? : Total 6 Click Score: 15    End of Session Equipment Utilized During Treatment: Gait belt Activity Tolerance: Patient limited by fatigue Patient left: in bed;with call bell/phone within reach;with bed alarm set   PT Visit Diagnosis: Other abnormalities of gait and mobility (R26.89);Muscle weakness (generalized) (M62.81) Pain - part of body: Leg     Time: [id number] PT Time Calculation (min) (ACUTE ONLY): 32 min  Charges:  $Gait Training: 8-22 mins $Therapeutic Activity: 8-22 mins                         Doreatha Massed, PT Acute Rehabilitation  Office: [phone]

## 2022-04-27 NOTE — Hospital Course (Signed)
Patient 72 year old female history of hypertension, NHL, lymphedema presented to the ED with complaints of worsening lymphedema, redness which seem to be worsening with some erythema and heat as well as increased weeping and draining. Patient states ongoing drainage despite using lymphedema pump and compression hose. Patient unable to wear closed due to excessive drainage. Patient presented to the ED, lower extremity Dopplers done negative for DVT. Patient admitted due to concerns for probable lower extremity cellulitis.

## 2022-04-27 NOTE — Assessment & Plan Note (Signed)
Acute respiratory failure ruled out

## 2022-04-27 NOTE — Assessment & Plan Note (Signed)
Resolved

## 2022-04-27 NOTE — Assessment & Plan Note (Addendum)
Presented with leg swelling, CXR with cardiomegaly, edema.  Diuresed 22L, still endorses dyspnea with exertion. - Continue furosemide - Obtain echocardiogram

## 2022-04-27 NOTE — Progress Notes (Signed)
Pt placed on bipap for the night. °

## 2022-04-27 NOTE — Progress Notes (Signed)
  Progress Note   Patient: Allison Short DOB: [birth year] DOA: 04/08/2022     18 DOS: the patient was seen and examined on 04/27/2022 at 10:52AM      Brief hospital course: Patient 72 year old female history of hypertension, NHL, lymphedema presented to the ED with complaints of worsening lymphedema, redness which seem to be worsening with some erythema and heat as well as increased weeping and draining. Patient states ongoing drainage despite using lymphedema pump and compression hose. Patient unable to wear closed due to excessive drainage. Patient presented to the ED, lower extremity Dopplers done negative for DVT. Patient admitted due to concerns for probable lower extremity cellulitis.      Assessment and Plan: * Cellulitis of lower extremity with chronic  lymphedema Completed antibiotics - Return to lymphedema clinic after discharge - Obtain NIV - Low sodium diet  Morbid obesity (HCC)     Chronic respiratory failure with hypercapnia due to obesity hypoventilation syndrome and obstructive sleep apnea Acute respiratory failure ruled out  Acute on chronic diastolic CHF (congestive heart failure) ([place]) Presented with leg swelling, CXR with cardiomegaly, edema.  Diuresed 22L, still endorses dyspnea with exertion. - Continue furosemide - Obtain echocardiogram  Delirium due to multiple etiologies, acute, hyperactive Resolved  Lymphedema See above  Normocytic anemia Low B12 - Continue B12 supplement  Acute kidney injury, ruled out Minimal transient non clinically significant elevation of Cr, improved with diuresis  Hypokalemia Supplemented  Non Hodgkin's lymphoma (HCC) S/p chemo/radiation over 10 years ago.   Essential hypertension BP high normal - Continue furosemide, metoprolol          Subjective: Still dyspneic with exertion, no fever, no vomiting, no malaise.     Physical Exam: BP 138/63 (BP Location: Left Wrist)   Pulse 62   Temp 97.9 F  (36.6 C) (Axillary)   Resp 20   Ht '5\' 2"'$  (1.575 m)   Wt (!) 167.6 kg   SpO2 97%   BMI 67.58 kg/m   Morbidly obese adult female, lying in bed, no acute distress RRR, no murmurs, lower extremity lymphedema with brawny change, still pitting Respiratory rate normal, lung sounds diminished overall Abdomen soft without tenderness palpation Attention normal, affect normal, judgment insight appear normal    Data Reviewed: Discussed with pulmonology Basic metabolic panel unremarkable Bicarb elevated CBC unchanged, mild anemia    Family Communication: None present    Disposition: Status is: Inpatient         Author: Edwin Dada, MD 04/27/2022 4:48 PM  For on call review [email or web address].

## 2022-04-27 NOTE — Assessment & Plan Note (Signed)
Minimal transient non clinically significant elevation of Cr, improved with diuresis

## 2022-04-27 NOTE — Care Management Important Message (Signed)
Important Message  Patient Details IM Letter given. Name: Allison Short MRN: [id number] Date of Birth: 1950-11-25   Medicare Important Message Given:  Yes     Kerin Salen 04/27/2022, 11:21 AM

## 2022-04-28 ENCOUNTER — Inpatient Hospital Stay (HOSPITAL_COMMUNITY): Payer: Medicare HMO

## 2022-04-28 DIAGNOSIS — I5033 Acute on chronic diastolic (congestive) heart failure: Secondary | ICD-10-CM

## 2022-04-28 LAB — ECHOCARDIOGRAM COMPLETE
AR max vel: 2.05 cm2
AV Area VTI: 1.67 cm2
AV Area mean vel: 1.82 cm2
AV Mean grad: 8 mmHg
AV Peak grad: 13.2 mmHg
Ao pk vel: 1.82 m/s
Area-P 1/2: 2.69 cm2
Calc EF: 54.9 %
Height: 62 in
S' Lateral: 3.6 cm
Single Plane A2C EF: 49.8 %
Single Plane A4C EF: 55.2 %
Weight: 5911.86 oz

## 2022-04-28 NOTE — Progress Notes (Signed)
Occupational Therapy Treatment Patient Details Name: Allison Short MRN: [id number] DOB: 11-21-1950 Today's Date: 04/28/2022   History of present illness Patient is a 72 year old female who presented  on 04/08/2022 with weeping and draining lymphedema with increased redness and heat. Patient was admitted with cellulitis of lower extremities with chronic lymphedema, hypokalemia, and expiratory wheezing. LE dopplers negative for DVT. PMH including but not limited to: B TKA, HTN, Non-hodgkin's lymphoma, lymphedema, and morbid obesity.   OT comments  Patient demonstrated ability to ambulate bathroom distance and perform comfort height toilet transfer performing a pseudo activity. She reports her bathroom is very large and the bathroom in hospital is limiting due to it's size. Therapist and patient discussed SNF vs HH at discharge with therapist having patient mobilize in room to demonstrate her physical strength and activity tolerance. She reports she is weaker than her baseline. Patient is currently not at her baseline and will continue to benefit from short term rehab.    Recommendations for follow up therapy are one component of a multi-disciplinary discharge planning process, led by the attending physician.  Recommendations may be updated based on patient status, additional functional criteria and insurance authorization.    Follow Up Recommendations  Skilled nursing-short term rehab (<3 hours/day)     Assistance Recommended at Discharge Frequent or constant Supervision/Assistance  Patient can return home with the following  Two people to help with walking and/or transfers;Assistance with cooking/housework;Direct supervision/assist for medications management;Assist for transportation;Help with stairs or ramp for entrance;Direct supervision/assist for financial management;Two people to help with bathing/dressing/bathroom   Equipment Recommendations  None recommended by OT    Recommendations for Other  Services      Precautions / Restrictions Precautions Precautions: Fall Precaution Comments: Lymphedema in BLEs Restrictions Weight Bearing Restrictions: No       Mobility Bed Mobility                    Transfers                         Balance Overall balance assessment: Needs assistance Sitting-balance support: No upper extremity supported, Feet supported Sitting balance-Leahy Scale: Good     Standing balance support: During functional activity, Reliant on assistive device for balance Standing balance-Leahy Scale: Fair                             ADL either performed or assessed with clinical judgement   ADL Overall ADL's : Needs assistance/impaired     Grooming: Total assistance;Sitting Grooming Details (indicate cue type and reason): set upf or grooming in recliner                             Functional mobility during ADLs: Min guard;Rollator (4 wheels);+2 for safety/equipment General ADL Comments: Discussed how patient performs ADLS and IADLs at home to determine if patient can return home safely no. Patient is independenet using compensatory strategies and has no other assistance. Patient continues to demosntrates generalized weakness and decreased activity tolerance and has not returned to her baselnie.    Extremity/Trunk Assessment Upper Extremity Assessment Upper Extremity Assessment: Overall WFL for tasks assessed       Cervical / Trunk Assessment Cervical / Trunk Exceptions: body habitus    Vision Baseline Vision/History: 1 Wears glasses Patient Visual Report: No change from baseline  Perception     Praxis      Cognition Arousal/Alertness: Awake/alert Behavior During Therapy: WFL for tasks assessed/performed Overall Cognitive Status: Within Functional Limits for tasks assessed                                          Exercises      Shoulder Instructions       General  Comments      Pertinent Vitals/ Pain       Pain Assessment Pain Assessment: Faces Faces Pain Scale: Hurts little more Pain Location: bil LEs Pain Descriptors / Indicators: Discomfort, Heaviness Pain Intervention(s): Monitored during session  Home Living                                          Prior Functioning/Environment              Frequency  Min 2X/week        Progress Toward Goals  OT Goals(current goals can now be found in the care plan section)  Progress towards OT goals: Progressing toward goals  Acute Rehab OT Goals Patient Stated Goal: get stronger for return home OT Goal Formulation: With patient Time For Goal Achievement: 04/26/22 Potential to Achieve Goals: Good  Plan Discharge plan remains appropriate    Co-evaluation                 AM-PAC OT "6 Clicks" Daily Activity     Outcome Measure   Help from another person eating meals?: A Little Help from another person taking care of personal grooming?: A Little Help from another person toileting, which includes using toliet, bedpan, or urinal?: A Lot Help from another person bathing (including washing, rinsing, drying)?: A Lot Help from another person to put on and taking off regular upper body clothing?: A Little Help from another person to put on and taking off regular lower body clothing?: A Lot 6 Click Score: 15    End of Session Equipment Utilized During Treatment: Rollator (4 wheels)  OT Visit Diagnosis: Unsteadiness on feet (R26.81);Other abnormalities of gait and mobility (R26.89);Muscle weakness (generalized) (M62.81)   Activity Tolerance Patient tolerated treatment well   Patient Left in chair;with call bell/phone within reach   Nurse Communication Mobility status        Time: [id number] OT Time Calculation (min): 31 min  Charges: OT General Charges $OT Visit: 1 Visit OT Treatments $Therapeutic Activity: 23-37 mins  Gustavo Lah, OTR/L [name]  Office [phone]   Lenward Chancellor 04/28/2022, 11:49 AM

## 2022-04-28 NOTE — Progress Notes (Signed)
  Echocardiogram 2D Echocardiogram has been performed.  Allison Short 04/28/2022, 11:42 AM

## 2022-04-28 NOTE — TOC Progression Note (Signed)
Transition of Care [facility]) - Progression Note    Patient Details  Name: Allison Short MRN: [id number] Date of Birth: 01/28/1951  Transition of Care [facility]) CM/SW Contact  Shainna Faux, [name], RN Phone Number: 04/28/2022, 10:00 AM  Clinical Narrative: Per MD will await further assessment for d/c plans.      Expected Discharge Plan: [place] Barriers to Discharge: Continued Medical Work up  Expected Discharge Plan and Services   Discharge Planning Services: CM Consult Post Acute Care Choice:  Living arrangements for the past 2 months: Apartment Expected Discharge Date: 04/25/22                                     Social Determinants of Health (SDOH) Interventions SDOH Screenings   Food Insecurity: No Food Insecurity (04/08/2022)  Housing: Low Risk  (04/17/2022)  Transportation Needs: No Transportation Needs (04/08/2022)  Utilities: Not At Risk (04/08/2022)  Alcohol Screen: Low Risk  (09/13/2021)  Depression (PHQ2-9): Low Risk  (09/13/2021)  Financial Resource Strain: Low Risk  (09/13/2021)  Physical Activity: Inactive (09/13/2021)  Social Connections: Socially Isolated (09/13/2021)  Stress: No Stress Concern Present (09/13/2021)  Tobacco Use: Low Risk  (04/15/2022)    Readmission Risk Interventions     No data to display

## 2022-04-28 NOTE — Progress Notes (Signed)
  Progress Note   Patient: Allison Short [id number] DOB: 07/14/1950 DOA: 04/08/2022     19 DOS: the patient was seen and examined on 04/28/2022 at 9:13AM      Brief hospital course: Allison Short is a 72 y.o. F with morbid obesity, long-standing lymphedema, HTN, and NHL in remission who presented with leg swelling and redness.        Assessment and Plan: * Cellulitis of lower extremity with chronic  lymphedema Completed antibiotics - Return to lymphedema clinic after discharge - Optimize sleep disordered breathing - Low sodium diet  Morbid obesity (HCC)     Chronic respiratory failure with hypercapnia due to obesity hypoventilation syndrome and obstructive sleep apnea Acute respiratory failure ruled out  Acute on chronic diastolic CHF (congestive heart failure) ([place]) Presented with leg swelling, CXR with cardiomegaly, edema.  Diuresed 22L, still endorses dyspnea with exertion. - Continue furosemide PO - Echocardiogram pending    Normocytic anemia Low B12 - Continue B12 supplement    Essential hypertension BP high normal - Continue furosemide, metoprolol          Subjective: Stable, no new sypmtoms, no nursing concerns.     Physical Exam: BP (!) 121/52 (BP Location: Left Arm)   Pulse 94   Temp 98.5 F (36.9 C) (Oral)   Resp 16   Ht '5\' 2"'$  (1.575 m)   Wt (!) 167.6 kg   SpO2 98%   BMI 67.58 kg/m   Morbidly obese adult female, lying in bed, no acute distress RRR, no murmurs, lower extremity lymphedema with brawny change, still pitting Respiratory rate normal, lung sounds diminished overall Abdomen soft without tenderness palpation Attention normal, affect normal, judgment insight appear normal    Data Reviewed: Discussed with pulmonology    Family Communication: None present    Disposition: Status is: Inpatient To the best of my knowledge, the above diagnoses best describe the patient's illness, and all expected diagnostic testing that requires  acute inpatient care has been completed.  At this point, the patient is medically ready to transition to an outpatient treatment plan, but because of their diminished functional status from baseline, age, and obesity and CHF and recent cellulitis, it is my medical opinion that discharge home at this time would pose a high risk unnecessary harm.  Safe disposition is being sought, and the patient will be ready to transition to that setting as soon as arranged.         Author: Edwin Dada, MD 04/28/2022 11:28 AM  For on call review [email or web address].

## 2022-04-28 NOTE — Discharge Summary (Addendum)
Physician Discharge Summary   Patient: Allison Short MRN: [id number] DOB: 08-12-1950  Admit date:     04/08/2022  Discharge date: 04/29/22  Discharge Physician: Edwin Dada   PCP: Dorothyann Peng, NP     Recommendations at discharge:  Follow up with Pulmonology Dr. Annamaria Boots Mar 19 at 3:30pm Follow up with PCP Dr. Carlisle Cater 1 week after SNF discharge Dr. Carlisle Cater: Please refer to Cardiology for new diastolic CHF  [facility]:  Please check BMP and CBC in 1 week Please administer nightly CPAP with AutoPAP settings at 10-20 or regular CPAP 10 with titration per Pulmonology Please refer back to [facility]      Discharge Diagnoses: Principal Problem:   Cellulitis of lower extremity with chronic  lymphedema Active Problems:   Morbid obesity (HCC)   Chronic respiratory failure with hypercapnia due to obesity hypoventilation syndrome and obstructive sleep apnea   Essential hypertension   Non Hodgkin's lymphoma (HCC)   Hypokalemia   Acute kidney injury, ruled out   Normocytic anemia   Lymphedema   Delirium due to multiple etiologies, acute, hyperactive   Acute on chronic diastolic CHF (congestive heart failure) [facility])      Hospital Course: Allison Short is a 72 y.o. F with morbid obesity, long-standing lymphedema, HTN, and NHL in remission who presented with leg swelling and redness.        * Cellulitis of lower extremity with chronic  lymphedema Admitted and treated with antibiotics, symptoms improved, some residual redness at discharge, but no WBC no fever.  - Return to lymphedema clinic after discharge - Optimize sleep disordered breathing - Low sodium diet    Morbid obesity (HCC) BMI >60    Chronic respiratory failure with hypercapnia due to obesity hypoventilation syndrome and obstructive sleep apnea Acute respiratory failure ruled out Was noted incidentally to have nocturnal desaturations and hypercarbia.  Pulmonology were consulted  who recommended NIV.  Due to insurance barriers, NIV could not be provided at [facility].  Discussed with Pulmonology who recommended CPAP with above settings, clinical follow up in [place] clinic. - CPAP at night - Follow up with Pulmonology  Acute on chronic diastolic CHF (congestive heart failure) ([place]) Presented with leg swelling, CXR with cardiomegaly, edema.  Diuresed 22L, still endorses dyspnea with exertion.  Discharged on new daily Lasix.    Anemia due to vitamin B12 deficiency Discharged on new B12 supplement  Acute kidney injury, ruled out Minimal transient non clinically significant elevation of Cr, improved with diuresis  Hypokalemia Supplemented  Non Hodgkin's lymphoma (HCC) S/p chemo/radiation over 10 years ago.               The [facility] Controlled Substances Registry was reviewed for this patient prior to discharge.   Consultants: Pulmonology   Disposition: Skilled nursing facility Diet recommendation:  Discharge Diet Orders (From admission, onward)     Start     Ordered   04/18/22 0000  Diet - low sodium heart healthy        04/18/22 1034             DISCHARGE MEDICATION: Allergies as of 04/29/2022       Reactions   Ciprofloxin Hcl [ciprofloxacin] Other (See Comments)   Unstable gait   Morphine And Related Anaphylaxis, Other (See Comments)   Tolerates hydrocodone   Asa [aspirin] Hives, Other (See Comments)   Hives can become SEVERE, requiring intervention   Codeine Hives   Chia Oil Swelling, Other (See Comments)  Possibly "chia tea" = Face and lips became swollen   Erythromycin Other (See Comments)   Reaction not recalled   Nsaids Other (See Comments)   Lowers GFR   Penicillins Other (See Comments)   Reaction not recalled- was told to "never take this again" by family        Medication List     STOP taking these medications    benazepril-hydrochlorthiazide 20-25 MG tablet Commonly known as: LOTENSIN HCT       TAKE  these medications    acetaminophen 500 MG tablet Commonly known as: TYLENOL Take 1,000 mg by mouth every 8 (eight) hours as needed for mild pain, moderate pain or headache.   bisacodyl 5 MG EC tablet Commonly known as: DULCOLAX Take 2 tablets (10 mg total) by mouth daily.   camphor-menthol lotion Commonly known as: SARNA Apply topically as needed for itching.   cyanocobalamin 1000 MCG tablet Commonly known as: VITAMIN B12 Take 1 tablet (1,000 mcg total) by mouth daily.   fluticasone 50 MCG/ACT nasal spray Commonly known as: FLONASE Place 1 spray into both nostrils daily.   furosemide 20 MG tablet Commonly known as: LASIX Take 1 tablet (20 mg total) by mouth daily.   hydrocerin Crea Apply 1 Application topically 2 (two) times daily.   hydrocortisone cream 1 % Apply 1 Application topically 3 (three) times daily as needed for itching (minor skin irritation).   hydrOXYzine 25 MG tablet Commonly known as: ATARAX Take 1 tablet (25 mg total) by mouth 3 (three) times daily as needed for anxiety.   ipratropium-albuterol 0.5-2.5 (3) MG/3ML Soln Commonly known as: DUONEB Take 3 mLs by nebulization every 6 (six) hours as needed.   loratadine 10 MG tablet Commonly known as: CLARITIN Take 1 tablet (10 mg total) by mouth daily.   metoprolol tartrate 50 MG tablet Commonly known as: LOPRESSOR Take 0.5 tablets (25 mg total) by mouth 2 (two) times daily. KEEP APPT FOR REFILLS What changed: how much to take   Mucinex Sinus-Max Clear & Cool 0.05 % nasal spray Generic drug: oxymetazoline Place 1 spray into both nostrils 2 (two) times daily as needed for congestion.   ondansetron 4 MG tablet Commonly known as: ZOFRAN Take 1 tablet (4 mg total) by mouth every 6 (six) hours as needed for nausea.   pantoprazole 40 MG tablet Commonly known as: PROTONIX Take 1 tablet (40 mg total) by mouth daily.   polyethylene glycol 17 g packet Commonly known as: MIRALAX / GLYCOLAX Take 17 g by  mouth daily.   potassium chloride 10 MEQ tablet Commonly known as: KLOR-CON M Take 1 tablet (10 mEq total) by mouth daily.   sodium chloride 0.65 % Soln nasal spray Commonly known as: OCEAN Place 1 spray into both nostrils as needed for congestion.   UNKNOWN TO PATIENT Take 0.5-1 ampules by nebulization See admin instructions. Unnamed neb treatment for post-nasal drip and coughing- Nebulize the contents of 0.5-1 ampule and inhale into the lungs once a day as needed for mucus removal or coughing   Vitamin D3 25 MCG (1000 UT) capsule Generic drug: Cholecalciferol Take 1,000 Units by mouth daily.               Durable Medical Equipment  (From admission, onward)           Start     Ordered   04/19/22 0918  For home use only DME Ventilator  Once       Comments: NIPPV at night.  Question:  Length of Need  Answer:  Lifetime   04/19/22 0918              Discharge Care Instructions  (From admission, onward)           Start     Ordered   04/18/22 0000  Discharge wound care:       Comments: Single layer of Xeroform to weeping areas of bilateral lower extremities, top with ABD pads secure with Kerlix and ace wraps from toes to knees change every 2 days.  Okay to take Ace wrap off at night and reapply first thing in the morning.   04/18/22 1033   04/18/22 0000  Discharge wound care:       Comments: Single level 0 form to weeping areas of bilateral lower extremity top with ABD pad secure with Kerlix and Ace wrap from toes to knees.  Change every 2 days.  Okay to take Ace wrap off at night and reapply in the morning.   04/18/22 1034   04/12/22 0000  Discharge wound care:       Comments: Every 3 days     Comments: Single layer of xeroform to weeping areas of bilateral LEs, top with ABD pads, secure with kerlix and ACE wraps from toes to knees. Change every 3 days. OK to take ACE wraps off at [facility] and reapply each AM.  04/11/22 0906   04/12/22 1115             Contact information for follow-up providers     Nafziger, Tommi Rumps, NP. Schedule an appointment as soon as possible for a visit in 2 week(s).   Specialty: Family Medicine Why: f/u in 1-2 weeks after discharge from skilled nursing facility. Needs referal back to lyphedema clinic Contact information: [place] [address] [place]. Call.   Why: [facility] at [place], [place], [place] [address] Call [phone] to get scheduled with the lymphedema clinic.        Juanito Doom, MD Follow up.   Specialty: Pulmonary Disease Why: FOR SLEEP STUDY Contact information: [place] [address] [phone]         Deneise Lever, MD Follow up on 05/10/2022.   Specialty: Pulmonary Disease Why: Follow-up at 3:30 PM. Contact information: [address] [place] [address] [phone]              Contact information for after-discharge care     Destination     [facility] .   Service: Skilled Nursing Contact information: [place] [phone]                     Discharge Instructions     Diet - low sodium heart healthy   Complete by: As directed    Discharge wound care:   Complete by: As directed    Every 3 days     Comments: Single layer of xeroform to weeping areas of bilateral LEs, top with ABD pads, secure with kerlix and ACE wraps from toes to knees. Change every 3 days. OK to take ACE wraps off at HS and reapply each AM.  04/11/22 0906   Discharge wound care:   Complete by: As directed    Single  layer of Xeroform to weeping areas of bilateral lower extremities, top with ABD pads secure with Kerlix and ace wraps from toes to knees change every 2 days.  Okay to take Ace  wrap off at night and reapply first thing in the morning.   Discharge wound care:   Complete by: As directed    Single level 0 form to weeping areas of bilateral lower extremity top with ABD pad secure with Kerlix and Ace wrap from toes to knees.  Change every 2 days.  Okay to take Ace wrap off at night and reapply in the morning.   Increase activity slowly   Complete by: As directed    Increase activity slowly   Complete by: As directed    No wound care   Complete by: As directed        Discharge Exam: Filed Weights   04/08/22 2046 04/13/22 1635  Weight: (!) 179.9 kg (!) 167.6 kg    General: Pt is alert, awake, not in acute distress Cardiovascular: RRR, nl S1-S2, no murmurs appreciated.   No LE edema.   Respiratory: Normal respiratory rate and rhythm.  CTAB without rales or wheezes. Abdominal: Abdomen soft and non-tender.  No distension or HSM.   MSK: Swelling resolved but still some residual chronic venous stasis changes and redness on bilateral LEs Neuro/Psych: Strength symmetric in upper and lower extremities.  Judgment and insight appear normal.   Condition at discharge: stable  The results of significant diagnostics from this hospitalization (including imaging, microbiology, ancillary and laboratory) are listed below for reference.   Imaging Studies: ECHOCARDIOGRAM COMPLETE  Result Date: 04/28/2022    ECHOCARDIOGRAM REPORT   Patient Name:   Allison Short Date of Exam: 04/28/2022 Medical Rec #:  [id number]  Height:       62.0 in Accession #:    [id number] Weight:       369.5 lb Date of Birth:  19-Nov-1950   BSA:          2.481 m Patient Age:    71 years   BP:           121/52 mmHg Patient Gender: F          HR:           62 bpm. Exam Location:  Inpatient Procedure: 2D Echo Indications:    CHF  History:        Patient has no prior history of Echocardiogram examinations.                 CHF; Risk Factors:Hypertension.  Sonographer:    Harvie Junior Referring Phys: [id number] Leib Elahi P  Juandaniel Manfredo  Sonographer Comments: Technically difficult study due to poor echo windows, suboptimal apical window, suboptimal subcostal window and patient is obese. Image acquisition challenging due to patient body habitus. IMPRESSIONS  1. Left ventricular ejection fraction, by estimation, is 55 to 60%. The left ventricle has normal function. Left ventricular endocardial border not optimally defined to evaluate regional wall motion. Left ventricular diastolic parameters were normal.  2. Right ventricular systolic function is normal. The right ventricular size is normal. There is normal pulmonary artery systolic pressure. The estimated right ventricular systolic pressure is [id number] mmHg.  3. Left atrial size was mildly dilated.  4. Right atrial size was mildly dilated.  5. The mitral valve is normal in structure. No evidence of mitral valve regurgitation.  6. The aortic valve is tricuspid. Aortic valve regurgitation is not visualized. Aortic valve sclerosis is present, with  no evidence of aortic valve stenosis.  7. The inferior vena cava is dilated in size with >50% respiratory variability, suggesting right atrial pressure of 8 mmHg. Conclusion(s)/Recommendation(s): Technically difficult images. Repeat study with Definity (no IV access for Definity when this study was performed). FINDINGS  Left Ventricle: Left ventricular ejection fraction, by estimation, is 55 to 60%. The left ventricle has normal function. Left ventricular endocardial border not optimally defined to evaluate regional wall motion. The left ventricular internal cavity size was normal in size. There is no left ventricular hypertrophy. Left ventricular diastolic parameters were normal. Normal left ventricular filling pressure. Right Ventricle: The right ventricular size is normal. Right vetricular wall thickness was not well visualized. Right ventricular systolic function is normal. There is normal pulmonary artery systolic pressure. The tricuspid regurgitant  velocity is 2.52 m/s, and with an assumed right atrial pressure of 8 mmHg, the estimated right ventricular systolic pressure is [id number] mmHg. Left Atrium: Left atrial size was mildly dilated. Right Atrium: Right atrial size was mildly dilated. Pericardium: There is no evidence of pericardial effusion. Mitral Valve: The mitral valve is normal in structure. No evidence of mitral valve regurgitation. Tricuspid Valve: The tricuspid valve is normal in structure. Tricuspid valve regurgitation is trivial. Aortic Valve: The aortic valve is tricuspid. Aortic valve regurgitation is not visualized. Aortic valve sclerosis is present, with no evidence of aortic valve stenosis. Aortic valve mean gradient measures 8.0 mmHg. Aortic valve peak gradient measures 13.2 mmHg. Aortic valve area, by VTI measures 1.67 cm. Pulmonic Valve: The pulmonic valve was not well visualized. Aorta: The aortic root and ascending aorta are structurally normal, with no evidence of dilitation. Venous: The inferior vena cava is dilated in size with greater than 50% respiratory variability, suggesting right atrial pressure of 8 mmHg. IAS/Shunts: No atrial level shunt detected by color flow Doppler.  LEFT VENTRICLE PLAX 2D LVIDd:         5.10 cm      Diastology LVIDs:         3.60 cm      LV e' medial:    6.64 cm/s LV PW:         1.10 cm      LV E/e' medial:  14.0 LV IVS:        1.10 cm      LV e' lateral:   11.20 cm/s LVOT diam:     2.00 cm      LV E/e' lateral: 8.3 LV SV:         76 LV SV Index:   31 LVOT Area:     3.14 cm  LV Volumes (MOD) LV vol d, MOD A2C: 80.3 ml LV vol d, MOD A4C: 142.0 ml LV vol s, MOD A2C: 40.3 ml LV vol s, MOD A4C: 63.6 ml LV SV MOD A2C:     40.0 ml LV SV MOD A4C:     142.0 ml LV SV MOD BP:      64.4 ml RIGHT VENTRICLE RV Basal diam:  4.10 cm RV Mid diam:    2.80 cm RV S prime:     17.00 cm/s TAPSE (M-mode): 2.7 cm LEFT ATRIUM             Index        RIGHT ATRIUM           Index LA diam:        3.40 cm 1.37 cm/m   RA Area:      20.40 cm  LA Vol (A2C):   68.9 ml 27.77 ml/m  RA Volume:   63.60 ml  25.63 ml/m LA Vol (A4C):   70.0 ml 28.21 ml/m LA Biplane Vol: 70.4 ml 28.37 ml/m  AORTIC VALVE                     PULMONIC VALVE AV Area (Vmax):    2.05 cm      PV Vmax:       1.05 m/s AV Area (Vmean):   1.82 cm      PV Peak grad:  4.4 mmHg AV Area (VTI):     1.67 cm AV Vmax:           182.00 cm/s AV Vmean:          132.000 cm/s AV VTI:            0.454 m AV Peak Grad:      13.2 mmHg AV Mean Grad:      8.0 mmHg LVOT Vmax:         119.00 cm/s LVOT Vmean:        76.500 cm/s LVOT VTI:          0.241 m LVOT/AV VTI ratio: 0.53  AORTA Ao Root diam: 3.10 cm Ao Asc diam:  3.40 cm MITRAL VALVE                TRICUSPID VALVE MV Area (PHT): 2.69 cm     TR Peak grad:   25.4 mmHg MV Decel Time: 282 msec     TR Vmax:        252.00 cm/s MV E velocity: 93.20 cm/s MV A velocity: 106.00 cm/s  SHUNTS MV E/A ratio:  0.88         Systemic VTI:  0.24 m                             Systemic Diam: 2.00 cm Dani Gobble Croitoru MD Electronically signed by Sanda Klein MD Signature Date/Time: 04/28/2022/2:20:34 PM    Final    DG Chest 1 View  Result Date: 04/17/2022 CLINICAL DATA:  Shortness of breath. Lymphedema. EXAM: CHEST  1 VIEW COMPARISON:  04/14/2022 FINDINGS: Similar cardiomegaly. Improving vascular congestion. Subsegmental opacity in the left lung base likely atelectasis. Atelectasis versus trace fluid in the fissure in the right mid lung. No large subpulmonic effusion. No pneumothorax. IMPRESSION: 1. Improving vascular congestion. 2. Subsegmental opacity in the left lung base likely atelectasis. Electronically Signed   By: Keith Rake M.D.   On: 04/17/2022 17:13   CT HEAD WO CONTRAST (5MM)  Result Date: 04/15/2022 CLINICAL DATA:  Altered mental status EXAM: CT HEAD WITHOUT CONTRAST TECHNIQUE: Contiguous axial images were obtained from the base of the skull through the vertex without intravenous contrast. RADIATION DOSE REDUCTION: This exam was  performed according to the departmental dose-optimization program which includes automated exposure control, adjustment of the mA and/or kV according to patient size and/or use of iterative reconstruction technique. COMPARISON:  None Available. FINDINGS: Brain: No evidence of acute infarction, hemorrhage, hydrocephalus, extra-axial collection or mass lesion/mass effect. Mild chronic microvascular ischemic change. Vascular: No hyperdense vessel or unexpected calcification. Skull: Normal. Negative for fracture or focal lesion. Sinuses/Orbits: Small right mastoid effusion. No middle ear effusion. Paranasal sinuses are clear. Orbits are unremarkable. Other: None. IMPRESSION: 1. No acute intracranial abnormality. 2. Small right mastoid effusion. Electronically Signed   By: Marin Olp.D.  On: 04/15/2022 13:58   DG Chest Port 1 View  Result Date: 04/14/2022 CLINICAL DATA:  [id number] Respiratory complication [id number] EXAM: PORTABLE CHEST 1 VIEW COMPARISON:  Chest x-ray 04/13/2022, chest x-ray 08/20/2020, CT angio chest 08/21/2020 FINDINGS: The heart and mediastinal contours are unchanged. Prominent hilar vasculature. Improved aeration of the left lower lobe. No focal consolidation. Mild pulmonary edema. No pleural effusion. No pneumothorax. No acute osseous abnormality. IMPRESSION: Pulmonary venous congestion with likely developing mild pulmonary edema. Electronically Signed   By: Iven Finn M.D.   On: 04/14/2022 03:35   DG Chest 1 View  Result Date: 04/13/2022 CLINICAL DATA:  Shortness of breath EXAM: CHEST  1 VIEW COMPARISON:  Portable exam 1039 hours compared to 08/20/2020 FINDINGS: Enlargement of cardiac silhouette with pulmonary vascular congestion. Mediastinal contour stable for technique. Mild LEFT basilar infiltrate. Remaining lungs clear. No pleural effusion or pneumothorax. BILATERAL glenohumeral degenerative changes. IMPRESSION: Mild LEFT basilar infiltrate favoring pneumonia. Electronically Signed    By: Lavonia Dana M.D.   On: 04/13/2022 13:23   VAS [place] LOWER EXTREMITY VENOUS (DVT) (7a-7p)  Result Date: 04/08/2022  Lower Venous DVT Study Patient Name:  Allison Short  Date of Exam:   04/08/2022 Medical Rec #: [id number]   Accession #:    [id number] Date of Birth: 1950-10-26    Patient Gender: F Patient Age:   53 years Exam Location:  [facility] Procedure:      VAS [place] LOWER EXTREMITY VENOUS (DVT) Referring Phys: KEN LE --------------------------------------------------------------------------------  Indications: Edema.  Risk Factors: None identified. Limitations: Body habitus, poor ultrasound/tissue interface and patient positioning, skin induration, lymphedema. Comparison Study: No prior studies. Performing Technologist: Oliver Hum RVT  Examination Guidelines: A complete evaluation includes B-mode imaging, spectral Doppler, color Doppler, and power Doppler as needed of all accessible portions of each vessel. Bilateral testing is considered an integral part of a complete examination. Limited examinations for reoccurring indications may be performed as noted. The reflux portion of the exam is performed with the patient in reverse Trendelenburg.  +---------+---------------+---------+-----------+----------+-------------------+ RIGHT    CompressibilityPhasicitySpontaneityPropertiesThrombus Aging      +---------+---------------+---------+-----------+----------+-------------------+ CFV      Full           Yes      Yes                                      +---------+---------------+---------+-----------+----------+-------------------+ SFJ      Full                                                             +---------+---------------+---------+-----------+----------+-------------------+ FV Prox  Full                                                             +---------+---------------+---------+-----------+----------+-------------------+ FV Mid  Not well visualized +---------+---------------+---------+-----------+----------+-------------------+ FV Distal                                             Not well visualized +---------+---------------+---------+-----------+----------+-------------------+ PFV                                                   Not well visualized +---------+---------------+---------+-----------+----------+-------------------+ POP      Full           Yes      No                                       +---------+---------------+---------+-----------+----------+-------------------+ PTV                                                   Not well visualized +---------+---------------+---------+-----------+----------+-------------------+ PERO                                                  Not well visualized +---------+---------------+---------+-----------+----------+-------------------+   +---------+---------------+---------+-----------+----------+-------------------+ LEFT     CompressibilityPhasicitySpontaneityPropertiesThrombus Aging      +---------+---------------+---------+-----------+----------+-------------------+ CFV      Full           Yes      Yes                                      +---------+---------------+---------+-----------+----------+-------------------+ SFJ      Full                                                             +---------+---------------+---------+-----------+----------+-------------------+ FV Prox  Full                                                             +---------+---------------+---------+-----------+----------+-------------------+ FV Mid                                                Not well visualized +---------+---------------+---------+-----------+----------+-------------------+ FV Distal                                             Not well visualized  +---------+---------------+---------+-----------+----------+-------------------+ PFV  Not well visualized +---------+---------------+---------+-----------+----------+-------------------+ POP                                                   Not well visualized +---------+---------------+---------+-----------+----------+-------------------+ PTV                                                   Not well visualized +---------+---------------+---------+-----------+----------+-------------------+ PERO                                                  Not well visualized +---------+---------------+---------+-----------+----------+-------------------+     Summary: RIGHT: - The visualized veins appear compressible. Limited study.  LEFT: - The visualized veins appear compressible. Limited study.  *See table(s) above for measurements and observations. Electronically signed by Deitra Mayo MD on 04/08/2022 at 7:06:26 PM.    Final     Microbiology: Results for orders placed or performed during the hospital encounter of 04/08/22  MRSA Next Gen by PCR, Nasal     Status: None   Collection Time: 04/13/22  3:36 PM   Specimen: Nasal Mucosa; Nasal Swab  Result Value Ref Range Status   MRSA by PCR Next Gen NOT DETECTED NOT DETECTED Final    Comment: (NOTE) The GeneXpert MRSA Assay (FDA approved for NASAL specimens only), is one component of a comprehensive MRSA colonization surveillance program. It is not intended to diagnose MRSA infection nor to guide or monitor treatment for MRSA infections. Test performance is not FDA approved in patients less than 26 years old. Performed at [facility], [place] [address] [place],  [address]     Labs: CBC: Recent Labs  Lab 04/27/22 0516  WBC 7.5  HGB 11.7*  HCT 37.5  MCV 103.6*  PLT 123XX123   Basic Metabolic Panel: Recent Labs  Lab 04/23/22 0525 04/27/22 0516   NA 139 137  K 4.4 4.0  CL 98 100  CO2 34* 31  GLUCOSE 173* 164*  BUN 27* 26*  CREATININE 0.93 0.94  CALCIUM 8.8* 8.6*  MG 1.8  --    Liver Function Tests: No results for input(s): "AST", "ALT", "ALKPHOS", "BILITOT", "PROT", "ALBUMIN" in the last 168 hours. CBG: Recent Labs  Lab 04/22/22 1131 04/22/22 1620  GLUCAP 177* 133*    Discharge time spent: approximately 35 minutes spent on discharge counseling, evaluation of patient on day of discharge, and coordination of discharge planning with nursing, social work, pharmacy and case management  Signed: Edwin Dada, MD [facility] 04/29/2022

## 2022-04-28 NOTE — TOC Progression Note (Addendum)
Transition of Care [facility]) - Progression Note    Patient Details  Name: Allison Short MRN: [id number] Date of Birth: 06-12-1950  Transition of Care [facility]) CM/SW Contact  Henrietta Dine, RN Phone Number: 04/28/2022, 11:19 AM  Clinical Narrative:    Notified by Nancy Marus, [employer] [facility]ist [employer] Supervisor; pt has bed offer at [facility]; spoke w/ pt in room; she accepts bed offer; explained to pt ins Josem Kaufmann is needed; pt says she needs to know if ins will cover stay b/c the only income she has covers her rent; gave pt facility info obtained from [email or web address].[email or web address]; spoke w/ Aliya S. Rep at [facility] and she says pt is followed by agency; ins auth submitted, Plan Auth ID # [id number], Auth ID# [id number], start date 04/28/22, end date 05/02/22; Dr Loleta Books notified.   -1408- notified Whitney, Intake at facility; she says pt can admit anytime tomorrow; directed TOC to call facility tomorrow; will be given RM# and call report # at that time; Dr Loleta Books notified.  -1531- pt notified ins auth received; she agrees w/ plan to d/c tomorrow.  Expected Discharge Plan: [place] Barriers to Discharge: Continued Medical Work up  Expected Discharge Plan and Services   Discharge Planning Services: CM Consult Post Acute Care Choice: [place] Living arrangements for the past 2 months: Apartment Expected Discharge Date: 04/25/22                                     Social Determinants of Health (SDOH) Interventions SDOH Screenings   Food Insecurity: No Food Insecurity (04/08/2022)  Housing: Low Risk  (04/17/2022)  Transportation Needs: No Transportation Needs (04/08/2022)  Utilities: Not At Risk (04/08/2022)  Alcohol Screen: Low Risk  (09/13/2021)  Depression (PHQ2-9): Low Risk  (09/13/2021)  Financial Resource Strain: Low Risk  (09/13/2021)  Physical Activity: Inactive (09/13/2021)  Social Connections: Socially Isolated (09/13/2021)  Stress: No Stress Concern Present (09/13/2021)  Tobacco  Use: Low Risk  (04/15/2022)    Readmission Risk Interventions     No data to display

## 2022-04-28 NOTE — Progress Notes (Signed)
The patient remains medically stable, no further medical changes are anticipated for discharge.  At discharge, a reasonable alternative to NIV would be CPAP with either of the following settings: 1. AutoPAP 10-20 cmH2O or 2. CPAP 10 cmH2O titrated up periodically by Pulmonology as an outpatient

## 2022-04-29 DIAGNOSIS — I10 Essential (primary) hypertension: Secondary | ICD-10-CM | POA: Diagnosis not present

## 2022-04-29 DIAGNOSIS — K5900 Constipation, unspecified: Secondary | ICD-10-CM | POA: Diagnosis not present

## 2022-04-29 DIAGNOSIS — E1100 Type 2 diabetes mellitus with hyperosmolarity without nonketotic hyperglycemic-hyperosmolar coma (NKHHC): Secondary | ICD-10-CM | POA: Diagnosis not present

## 2022-04-29 DIAGNOSIS — E559 Vitamin D deficiency, unspecified: Secondary | ICD-10-CM | POA: Diagnosis not present

## 2022-04-29 DIAGNOSIS — C8590 Non-Hodgkin lymphoma, unspecified, unspecified site: Secondary | ICD-10-CM | POA: Diagnosis not present

## 2022-04-29 DIAGNOSIS — E538 Deficiency of other specified B group vitamins: Secondary | ICD-10-CM | POA: Diagnosis not present

## 2022-04-29 DIAGNOSIS — M6281 Muscle weakness (generalized): Secondary | ICD-10-CM | POA: Diagnosis not present

## 2022-04-29 DIAGNOSIS — I5033 Acute on chronic diastolic (congestive) heart failure: Secondary | ICD-10-CM | POA: Diagnosis not present

## 2022-04-29 DIAGNOSIS — R2689 Other abnormalities of gait and mobility: Secondary | ICD-10-CM | POA: Diagnosis not present

## 2022-04-29 DIAGNOSIS — R531 Weakness: Secondary | ICD-10-CM | POA: Diagnosis not present

## 2022-04-29 DIAGNOSIS — E662 Morbid (severe) obesity with alveolar hypoventilation: Secondary | ICD-10-CM | POA: Diagnosis not present

## 2022-04-29 DIAGNOSIS — F05 Delirium due to known physiological condition: Secondary | ICD-10-CM | POA: Diagnosis not present

## 2022-04-29 DIAGNOSIS — I509 Heart failure, unspecified: Secondary | ICD-10-CM | POA: Diagnosis not present

## 2022-04-29 DIAGNOSIS — K219 Gastro-esophageal reflux disease without esophagitis: Secondary | ICD-10-CM | POA: Diagnosis not present

## 2022-04-29 DIAGNOSIS — Z7401 Bed confinement status: Secondary | ICD-10-CM | POA: Diagnosis not present

## 2022-04-29 DIAGNOSIS — J9612 Chronic respiratory failure with hypercapnia: Secondary | ICD-10-CM | POA: Diagnosis not present

## 2022-04-29 DIAGNOSIS — L03116 Cellulitis of left lower limb: Secondary | ICD-10-CM | POA: Diagnosis not present

## 2022-04-29 DIAGNOSIS — I890 Lymphedema, not elsewhere classified: Secondary | ICD-10-CM | POA: Diagnosis not present

## 2022-04-29 DIAGNOSIS — D649 Anemia, unspecified: Secondary | ICD-10-CM | POA: Diagnosis not present

## 2022-04-29 DIAGNOSIS — L03115 Cellulitis of right lower limb: Secondary | ICD-10-CM | POA: Diagnosis not present

## 2022-04-29 DIAGNOSIS — R41841 Cognitive communication deficit: Secondary | ICD-10-CM | POA: Diagnosis not present

## 2022-04-29 NOTE — TOC Transition Note (Signed)
Transition of Care [facility]) - CM/SW Discharge Note   Patient Details  Name: Allison Short MRN: [id number] Date of Birth: [birth year]  Transition of Care [facility]) CM/SW Contact:  Henrietta Dine, RN Phone Number: 04/29/2022, 10:40 AM   Clinical Narrative:    D/C orders received for pt; pt agreed to d/cplan to [facility]; spoke w/ Jackelyn Poling in [place] at facility; assigned RM # [id number] bed 1., call report # [phone];  she requested pt arrive after 1130 AM; [facility] will transport pt; [facility] called at [phone]; spoke w/ Thayer Headings and arranged 1130 pickup; no TOC needs.   Final next level of care: Skilled Nursing Facility Barriers to Discharge: No Barriers Identified   Patient Goals and CMS Choice CMS Medicare.gov Compare Post Acute Care list provided to:: Patient Choice offered to / list presented to : Patient  Discharge Placement                Patient chooses bed at: Other - please specify in the comment section below: [facility]) Patient to be transferred to facility by: [place] Name of family member notified: patient notified Patient and family notified of of transfer: 04/29/22  Discharge Plan and Services Additional resources added to the After Visit Summary for     Discharge Planning Services: CM Consult Post Acute Care Choice: [place]                               Social Determinants of Health (SDOH) Interventions SDOH Screenings   Food Insecurity: No Food Insecurity (04/08/2022)  Housing: Low Risk  (04/17/2022)  Transportation Needs: No Transportation Needs (04/08/2022)  Utilities: Not At Risk (04/08/2022)  Alcohol Screen: Low Risk  (09/13/2021)  Depression (PHQ2-9): Low Risk  (09/13/2021)  Financial Resource Strain: Low Risk  (09/13/2021)  Physical Activity: Inactive (09/13/2021)  Social Connections: Socially Isolated (09/13/2021)  Stress: No Stress Concern Present (09/13/2021)  Tobacco Use: Low Risk  (04/15/2022)     Readmission Risk Interventions      No data to display

## 2022-05-02 DIAGNOSIS — M6281 Muscle weakness (generalized): Secondary | ICD-10-CM | POA: Diagnosis not present

## 2022-05-02 DIAGNOSIS — E559 Vitamin D deficiency, unspecified: Secondary | ICD-10-CM | POA: Diagnosis not present

## 2022-05-02 DIAGNOSIS — I509 Heart failure, unspecified: Secondary | ICD-10-CM | POA: Diagnosis not present

## 2022-05-02 DIAGNOSIS — K219 Gastro-esophageal reflux disease without esophagitis: Secondary | ICD-10-CM | POA: Diagnosis not present

## 2022-05-02 DIAGNOSIS — L03116 Cellulitis of left lower limb: Secondary | ICD-10-CM | POA: Diagnosis not present

## 2022-05-02 DIAGNOSIS — K5900 Constipation, unspecified: Secondary | ICD-10-CM | POA: Diagnosis not present

## 2022-05-02 DIAGNOSIS — L03115 Cellulitis of right lower limb: Secondary | ICD-10-CM | POA: Diagnosis not present

## 2022-05-02 DIAGNOSIS — I10 Essential (primary) hypertension: Secondary | ICD-10-CM | POA: Diagnosis not present

## 2022-05-04 ENCOUNTER — Encounter: Payer: Self-pay | Admitting: Adult Health

## 2022-05-04 ENCOUNTER — Telehealth (INDEPENDENT_AMBULATORY_CARE_PROVIDER_SITE_OTHER): Payer: Medicare HMO | Admitting: Adult Health

## 2022-05-04 VITALS — Ht 62.0 in | Wt 343.0 lb

## 2022-05-04 DIAGNOSIS — D649 Anemia, unspecified: Secondary | ICD-10-CM

## 2022-05-04 DIAGNOSIS — L03115 Cellulitis of right lower limb: Secondary | ICD-10-CM

## 2022-05-04 DIAGNOSIS — J9612 Chronic respiratory failure with hypercapnia: Secondary | ICD-10-CM | POA: Diagnosis not present

## 2022-05-04 DIAGNOSIS — I5033 Acute on chronic diastolic (congestive) heart failure: Secondary | ICD-10-CM | POA: Diagnosis not present

## 2022-05-04 NOTE — Progress Notes (Unsigned)
Virtual Visit via Video Note  I connected with Allison Short  on 05/04/22 at  4:00 PM EDT by a video enabled telemedicine application and verified that I am speaking with the correct person using two identifiers.  Location patient: home Location provider:work or home office Persons participating in the virtual visit: patient, provider  I discussed the limitations of evaluation and management by telemedicine and the availability of in person appointments. The patient expressed understanding and agreed to proceed.   HPI: 72 year old female who  has a past medical history of Claustrophobia, History of blood transfusion, History of urinary tract infection, Hypertension, Non Hodgkin's lymphoma ([place]) (2008), Obesity, Osteopenia (04/2017), and Pancreatitis.  She is being evaluated today after recent hospital admission with discharge to SNF after she presented to the emergency room with leg swelling and redness.  She was diagnosed with cellulitis of her left lower extremity with chronic lymphedema.  She was admitted and treated with antibiotics and her symptoms improved.  She did have some residual redness at discharge but no WBC and no fever.  She was advised to return to the lymphedema clinic after discharge  Was also diagnosed with chronic respiratory failure and was noted incidentally to have nocturnal desaturations and hypercapnia.  Pulmonary were consulted who recommended NIV but due to insurance barriers NIV cannot be provided at [facility].  Pulmonary then recommended CPAP SNF and follow-up with pulmonary.  Her and her hospital admission her chest x-ray showed cardiomegaly and edema she was diuresed 22 L but still endorsed dyspnea on exertion.  She completed treatment at skilled nursing facility we are to refer her to cardiology for further evaluation.  She was discharged on Lasix daily  Additionally she was found to have a B12 deficiency anemia and discharged on B12 supplementation.  Today she reports  that she is still in SNF with expected discharge date of March 22.  She reports that she is doing well overall, does not like being in facility that she is at and is ready to get home.  She would like to discuss medication changes that were done in the hospital as she is not clear on this and is unsure if she is actually getting the medications that she supposed to be getting.  She has not noticed any additional swelling or recurrence of cellulitis .  She is using her CPAP nightly and feels as though after she got over being claustrophobic it is working well.   ROS: See pertinent positives and negatives per HPI.  Past Medical History:  Diagnosis Date   Claustrophobia    History of blood transfusion    2016   History of urinary tract infection    Hypertension    Non Hodgkin's lymphoma ([place]) 2008   Obesity    Osteopenia 04/2017   T score -1.4 FRAX 7.5% / 0.8%   Pancreatitis     Past Surgical History:  Procedure Laterality Date   BIOPSY THYROID     BONE MARROW BIOPSY     CESAREAN SECTION  10/26/1975   CHOLECYSTECTOMY     2006   PORTA CATH INSERTION     TONSILLECTOMY AND ADENOIDECTOMY     1952   TOTAL KNEE ARTHROPLASTY Bilateral    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      Family History  Problem Relation Age of Onset   Arthritis Maternal Grandmother    Diabetes Son        Current Outpatient Medications:    acetaminophen (TYLENOL) 500 MG tablet, Take  1,000 mg by mouth every 8 (eight) hours as needed for mild pain, moderate pain or headache., Disp: , Rfl:    bisacodyl (DULCOLAX) 5 MG EC tablet, Take 2 tablets (10 mg total) by mouth daily., Disp: 30 tablet, Rfl: 0   camphor-menthol (SARNA) lotion, Apply topically as needed for itching., Disp: 222 mL, Rfl: 0   Cholecalciferol (VITAMIN D3) 25 MCG (1000 UT) capsule, Take 1,000 Units by mouth daily., Disp: , Rfl:    cyanocobalamin (VITAMIN B12) 1000 MCG tablet, Take 1 tablet (1,000 mcg total) by mouth daily., Disp: , Rfl:    fluticasone (FLONASE) 50  MCG/ACT nasal spray, Place 1 spray into both nostrils daily., Disp: 9.9 mL, Rfl: 2   furosemide (LASIX) 20 MG tablet, Take 1 tablet (20 mg total) by mouth daily., Disp: 30 tablet, Rfl: 4   hydrocerin (EUCERIN) CREA, Apply 1 Application topically 2 (two) times daily., Disp: , Rfl: 0   hydrocortisone cream 1 %, Apply 1 Application topically 3 (three) times daily as needed for itching (minor skin irritation)., Disp: 30 g, Rfl: 0   hydrOXYzine (ATARAX) 25 MG tablet, Take 1 tablet (25 mg total) by mouth 3 (three) times daily as needed for anxiety., Disp: 20 tablet, Rfl: 0   ipratropium-albuterol (DUONEB) 0.5-2.5 (3) MG/3ML SOLN, Take 3 mLs by nebulization every 6 (six) hours as needed., Disp: 1080 mL, Rfl: 1   loratadine (CLARITIN) 10 MG tablet, Take 1 tablet (10 mg total) by mouth daily., Disp: 30 tablet, Rfl: 1   metoprolol tartrate (LOPRESSOR) 50 MG tablet, Take 0.5 tablets (25 mg total) by mouth 2 (two) times daily. KEEP APPT FOR REFILLS, Disp: 90 tablet, Rfl: 0   MUCINEX SINUS-MAX CLEAR & COOL 0.05 % nasal spray, Place 1 spray into both nostrils 2 (two) times daily as needed for congestion., Disp: , Rfl:    ondansetron (ZOFRAN) 4 MG tablet, Take 1 tablet (4 mg total) by mouth every 6 (six) hours as needed for nausea., Disp: 20 tablet, Rfl: 0   pantoprazole (PROTONIX) 40 MG tablet, Take 1 tablet (40 mg total) by mouth daily., Disp: 30 tablet, Rfl: 1   polyethylene glycol (MIRALAX / GLYCOLAX) 17 g packet, Take 17 g by mouth daily., Disp: 14 each, Rfl: 0   potassium chloride (KLOR-CON M) 10 MEQ tablet, Take 1 tablet (10 mEq total) by mouth daily., Disp: 30 tablet, Rfl: 4   sodium chloride (OCEAN) 0.65 % SOLN nasal spray, Place 1 spray into both nostrils as needed for congestion., Disp: , Rfl:    UNKNOWN TO PATIENT, Take 0.5-1 ampules by nebulization See admin instructions. Unnamed neb treatment for post-nasal drip and coughing- Nebulize the contents of 0.5-1 ampule and inhale into the lungs once a day as  needed for mucus removal or coughing, Disp: , Rfl:   EXAM:  VITALS per patient if applicable:  GENERAL: alert, oriented, appears well and in no acute distress  HEENT: atraumatic, conjunttiva clear, no obvious abnormalities on inspection of external nose and ears  NECK: normal movements of the head and neck  LUNGS: on inspection no signs of respiratory distress, breathing rate appears normal, no obvious gross SOB, gasping or wheezing  CV: no obvious cyanosis  MS: moves all visible extremities without noticeable abnormality  PSYCH/NEURO: pleasant and cooperative, no obvious depression or anxiety, speech and thought processing grossly intact  ASSESSMENT AND PLAN:  Discussed the following assessment and plan:  1. Cellulitis of right lower extremity -I reviewed her hospital notes and discussed medication changes  that were done in the hospital including starting Lasix 20 mg daily, starting B12 supplementation, and discontinuing Lotensin HCT.  She was advised to follow-up with staff at her facility to make sure she is getting these medications. -Will have her follow-up in the office when she is discharged to go over the changes in more detail as her cellular service/Wi-Fi was not adequate during this virtual visit.  2. Chronic respiratory failure with hypercapnia due to obesity hypoventilation syndrome and obstructive sleep apnea - Continue CPAP  3. Acute on chronic diastolic CHF (congestive heart failure) ([place]) - Referral to Cardiology once she has been released from SNF - Make sure she is taking her lasix   4. Normocytic anemia - B12 supplementation       I discussed the assessment and treatment plan with the patient. The patient was provided an opportunity to ask questions and all were answered. The patient agreed with the plan and demonstrated an understanding of the instructions.   The patient was advised to call back or seek an in-person evaluation if the symptoms worsen or  if the condition fails to improve as anticipated.   Dorothyann Peng, NP

## 2022-05-05 DIAGNOSIS — D649 Anemia, unspecified: Secondary | ICD-10-CM | POA: Diagnosis not present

## 2022-05-05 DIAGNOSIS — E662 Morbid (severe) obesity with alveolar hypoventilation: Secondary | ICD-10-CM | POA: Diagnosis not present

## 2022-05-05 DIAGNOSIS — E538 Deficiency of other specified B group vitamins: Secondary | ICD-10-CM | POA: Diagnosis not present

## 2022-05-05 DIAGNOSIS — I890 Lymphedema, not elsewhere classified: Secondary | ICD-10-CM | POA: Diagnosis not present

## 2022-05-05 DIAGNOSIS — L03115 Cellulitis of right lower limb: Secondary | ICD-10-CM | POA: Diagnosis not present

## 2022-05-05 DIAGNOSIS — I509 Heart failure, unspecified: Secondary | ICD-10-CM | POA: Diagnosis not present

## 2022-05-05 DIAGNOSIS — J9612 Chronic respiratory failure with hypercapnia: Secondary | ICD-10-CM | POA: Diagnosis not present

## 2022-05-05 DIAGNOSIS — L03116 Cellulitis of left lower limb: Secondary | ICD-10-CM | POA: Diagnosis not present

## 2022-05-06 DIAGNOSIS — I509 Heart failure, unspecified: Secondary | ICD-10-CM | POA: Diagnosis not present

## 2022-05-06 DIAGNOSIS — L03116 Cellulitis of left lower limb: Secondary | ICD-10-CM | POA: Diagnosis not present

## 2022-05-06 DIAGNOSIS — K219 Gastro-esophageal reflux disease without esophagitis: Secondary | ICD-10-CM | POA: Diagnosis not present

## 2022-05-06 DIAGNOSIS — M6281 Muscle weakness (generalized): Secondary | ICD-10-CM | POA: Diagnosis not present

## 2022-05-06 DIAGNOSIS — I10 Essential (primary) hypertension: Secondary | ICD-10-CM | POA: Diagnosis not present

## 2022-05-06 DIAGNOSIS — K5900 Constipation, unspecified: Secondary | ICD-10-CM | POA: Diagnosis not present

## 2022-05-06 DIAGNOSIS — E559 Vitamin D deficiency, unspecified: Secondary | ICD-10-CM | POA: Diagnosis not present

## 2022-05-06 DIAGNOSIS — L03115 Cellulitis of right lower limb: Secondary | ICD-10-CM | POA: Diagnosis not present

## 2022-05-08 NOTE — Progress Notes (Signed)
05/10/22- 72 yoF never smoker for evaluation of sleep disordered breathing attributed to OHS. Dr Lake Bells: Please arrange outpatient 30 min hospital follow up with a sleep specialist in 3-4 weeks for obesity hypoventilation syndrome, needs BIPAP titration. Hosp 04/08/22-04/29/22> cellulitis/ chronic lymphedema, Chronic Hypoxic Resp Failure, Morbid Obesity, Obesity -Hypoventilation Syndrome, , HTN, Non Hodgkins Lymphoma, dCHF ABG O2 2L 04/18/22- 7.44/PCO2 57, PO2 92, HCO3 38.7 ECHO 04/28/22- no PAH Neb Duoneb,  Body weight today- -----Currently on cpap machine was sent home with one from the hospital. Never had a sleep study done  [place] in [place] is here She has been at a skilled nursing facility and is getting ready to move out to an apartment in [place].  Does not have oxygen.  Using PAP auto 10-20 since hospitalization because "not enough CO2 going out". She denies prior diagnosis of heart or lung disease, but discharge dx included new dCHF.Marland Kitchen  ENT surgery for tonsils and adenoids.  No history of DVT.  Wears compression garments and has a lymphedema pump at home.  Never smoked. Aware of snoring.  Often daytime sleepiness. CXR 04/17/22 1V-  IMPRESSION: 1. Improving vascular congestion. 2. Subsegmental opacity in the left lung base likely atelectasis.  Prior to Admission medications   Medication Sig Start Date End Date Taking? Authorizing Provider  acetaminophen (TYLENOL) 500 MG tablet Take 1,000 mg by mouth every 8 (eight) hours as needed for mild pain, moderate pain or headache.    [provider]  bisacodyl (DULCOLAX) 5 MG EC tablet Take 2 tablets (10 mg total) by mouth daily. 04/19/22   Georgette Shell, MD  camphor-menthol [facility]) lotion Apply topically as needed for itching. 04/18/22   Georgette Shell, MD  Cholecalciferol (VITAMIN D3) 25 MCG (1000 UT) capsule Take 1,000 Units by mouth daily.    [provider]  cyanocobalamin  (VITAMIN B12) 1000 MCG tablet Take 1 tablet (1,000 mcg total) by mouth daily. 04/13/22   Eugenie Filler, MD  fluticasone (FLONASE) 50 MCG/ACT nasal spray Place 1 spray into both nostrils daily. 04/19/22   Georgette Shell, MD  furosemide (LASIX) 20 MG tablet Take 1 tablet (20 mg total) by mouth daily. 04/18/22   Georgette Shell, MD  hydrocerin (EUCERIN) CREA Apply 1 Application topically 2 (two) times daily. 04/18/22   Georgette Shell, MD  hydrocortisone cream 1 % Apply 1 Application topically 3 (three) times daily as needed for itching (minor skin irritation). 04/18/22   Georgette Shell, MD  hydrOXYzine (ATARAX) 25 MG tablet Take 1 tablet (25 mg total) by mouth 3 (three) times daily as needed for anxiety. 04/12/22   Eugenie Filler, MD  ipratropium-albuterol (DUONEB) 0.5-2.5 (3) MG/3ML SOLN Take 3 mLs by nebulization every 6 (six) hours as needed. 04/25/22   Eugenie Filler, MD  loratadine (CLARITIN) 10 MG tablet Take 1 tablet (10 mg total) by mouth daily. 04/13/22   Eugenie Filler, MD  metoprolol tartrate (LOPRESSOR) 50 MG tablet Take 0.5 tablets (25 mg total) by mouth 2 (two) times daily. KEEP APPT FOR REFILLS 04/18/22 07/17/22  Georgette Shell, MD  [place] 0.05 % nasal spray Place 1 spray into both nostrils 2 (two) times daily as needed for congestion.    [provider]  ondansetron (ZOFRAN) 4 MG tablet Take 1 tablet (4 mg total)  by mouth every 6 (six) hours as needed for nausea. 04/12/22   Eugenie Filler, MD  pantoprazole (PROTONIX) 40 MG tablet Take 1 tablet (40 mg total) by mouth daily. 04/13/22   Eugenie Filler, MD  polyethylene glycol (MIRALAX / GLYCOLAX) 17 g packet Take 17 g by mouth daily. 04/19/22   Georgette Shell, MD  potassium chloride (KLOR-CON M) 10 MEQ tablet Take 1 tablet (10 mEq total) by mouth daily. 04/18/22   Georgette Shell, MD  sodium chloride (OCEAN) 0.65 % SOLN nasal spray Place 1 spray into both  nostrils as needed for congestion.    [provider]  UNKNOWN TO PATIENT Take 0.5-1 ampules by nebulization See admin instructions. Unnamed neb treatment for post-nasal drip and coughing- Nebulize the contents of 0.5-1 ampule and inhale into the lungs once a day as needed for mucus removal or coughing    [provider]   Past Medical History:  Diagnosis Date   Claustrophobia    History of blood transfusion    2016   History of urinary tract infection    Hypertension    Non Hodgkin's lymphoma ([place]) 2008   Obesity    Osteopenia 04/2017   T score -1.4 FRAX 7.5% / 0.8%   Pancreatitis    Past Surgical History:  Procedure Laterality Date   BIOPSY THYROID     BONE MARROW BIOPSY     CESAREAN SECTION  10/26/1975   CHOLECYSTECTOMY     2006   PORTA CATH INSERTION     TONSILLECTOMY AND ADENOIDECTOMY     1952   TOTAL KNEE ARTHROPLASTY Bilateral    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     Family History  Problem Relation Age of Onset   Arthritis Maternal Grandmother    Diabetes Son    Social History   Socioeconomic History   Marital status: Divorced    Spouse name: Not on file   Number of children: Not on file   Years of education: Not on file   Highest education level: Not on file  Occupational History   Not on file  Tobacco Use   Smoking status: Never   Smokeless tobacco: Never  Vaping Use   Vaping Use: Never used  Substance and Sexual Activity   Alcohol use: Yes    Alcohol/week: 0.0 standard drinks of alcohol    Comment: Occas   Drug use: No   Sexual activity: Not Currently    Comment: 1st intercourse 15 yo-5 partners  Other Topics Concern   Not on file  Social History Narrative   Not on file   Social Determinants of Health   Financial Resource Strain: Low Risk  (09/13/2021)   Overall Financial Resource Strain (CARDIA)    Difficulty of Paying Living Expenses: Not hard at all  Food Insecurity: No Food Insecurity (04/08/2022)   Hunger Vital Sign    Worried About  Running Out of Food in the Last Year: Never true    Ran Out of Food in the Last Year: Never true  Transportation Needs: No Transportation Needs (04/08/2022)   PRAPARE - Hydrologist (Medical): No    Lack of Transportation (Non-Medical): No  Physical Activity: Inactive (09/13/2021)   Exercise Vital Sign    Days of Exercise per Week: 0 days    Minutes of Exercise per Session: 0 min  Stress: No Stress Concern Present (09/13/2021)   [employer] [place]  of Stress : Not at all  Social Connections: Socially Isolated (09/13/2021)   Social Connection and Isolation Panel [NHANES]    Frequency of Communication with Friends and Family: More than three times a week    Frequency of Social Gatherings with Friends and Family: More than three times a week    Attends Religious Services: Never    Marine scientist or Organizations: No    Attends Archivist Meetings: Never    Marital Status: Divorced  Human resources officer Violence: Not At Risk (04/08/2022)   Humiliation, Afraid, Rape, and Kick questionnaire    Fear of Current or Ex-Partner: No    Emotionally Abused: No    Physically Abused: No    Sexually Abused: No   ROS-see HPI   + = positive Constitutional:    weight loss, night sweats, fevers, chills, fatigue, lassitude.+morbidly obese HEENT:    headaches, difficulty swallowing, tooth/dental problems, sore throat,       sneezing, itching, ear ache, nasal congestion, post nasal drip, snoring CV:    chest pain, orthopnea, PND, swelling in lower extremities, anasarca,                                   dizziness, palpitations Resp:   shortness of breath with exertion or at rest.                productive cough,   non-productive cough, coughing up of blood.              change in color of mucus.  wheezing.   Skin:    rash or lesions. GI:  No-   heartburn, indigestion, abdominal pain, nausea,  vomiting, diarrhea,                 change in bowel habits, loss of appetite GU: dysuria, change in color of urine, no urgency or frequency.   flank pain. MS:   joint pain, stiffness, decreased range of motion, back pain. Neuro-     nothing unusual Psych:  change in mood or affect.  depression or anxiety.   memory loss.  OBJ- Physical Exam General- Alert, Oriented, Affect-appropriate, Distress- none acute Skin- rash-none, lesions- none, excoriation- none Lymphadenopathy- none Head- atraumatic            Eyes- Gross vision intact, PERRLA, conjunctivae and secretions clear            Ears- Hearing, canals-normal            Nose- Clear, no-Septal dev, mucus, polyps, erosion, perforation             Throat- Mallampati IV , mucosa clear , drainage- none, tonsils- atrophic, + missing teeth Neck- flexible , trachea midline, no stridor , thyroid nl, carotid no bruit Chest - symmetrical excursion , unlabored           Heart/CV- RRR , no murmur , no gallop  , no rub, nl s1 s2                           - JVD- none , edema 4+ chronic, stasis changes+, varices- none           Lung- clear to P&A, wheeze- none, cough- none , dullness-none, rub- none           Chest wall-  Abd-  Br/ Gen/ Rectal- Not done, not  indicated Extrem- cyanosis- none, clubbing, none, atrophy- none, strength- nl Neuro- grossly intact to observation

## 2022-05-09 DIAGNOSIS — L03115 Cellulitis of right lower limb: Secondary | ICD-10-CM | POA: Diagnosis not present

## 2022-05-09 DIAGNOSIS — M6281 Muscle weakness (generalized): Secondary | ICD-10-CM | POA: Diagnosis not present

## 2022-05-09 DIAGNOSIS — I10 Essential (primary) hypertension: Secondary | ICD-10-CM | POA: Diagnosis not present

## 2022-05-09 DIAGNOSIS — K219 Gastro-esophageal reflux disease without esophagitis: Secondary | ICD-10-CM | POA: Diagnosis not present

## 2022-05-09 DIAGNOSIS — L03116 Cellulitis of left lower limb: Secondary | ICD-10-CM | POA: Diagnosis not present

## 2022-05-09 DIAGNOSIS — E559 Vitamin D deficiency, unspecified: Secondary | ICD-10-CM | POA: Diagnosis not present

## 2022-05-09 DIAGNOSIS — I509 Heart failure, unspecified: Secondary | ICD-10-CM | POA: Diagnosis not present

## 2022-05-09 DIAGNOSIS — K5900 Constipation, unspecified: Secondary | ICD-10-CM | POA: Diagnosis not present

## 2022-05-10 ENCOUNTER — Encounter: Payer: Self-pay | Admitting: Internal Medicine

## 2022-05-10 ENCOUNTER — Ambulatory Visit (INDEPENDENT_AMBULATORY_CARE_PROVIDER_SITE_OTHER): Payer: Medicare HMO | Admitting: Internal Medicine

## 2022-05-10 VITALS — BP 138/88 | HR 78 | Ht 68.0 in | Wt 343.0 lb

## 2022-05-10 DIAGNOSIS — I890 Lymphedema, not elsewhere classified: Secondary | ICD-10-CM | POA: Diagnosis not present

## 2022-05-10 DIAGNOSIS — J9612 Chronic respiratory failure with hypercapnia: Secondary | ICD-10-CM

## 2022-05-10 DIAGNOSIS — I5033 Acute on chronic diastolic (congestive) heart failure: Secondary | ICD-10-CM

## 2022-05-10 NOTE — Patient Instructions (Signed)
Order- schedule Split night sleep study with CPAP to BIPAP titration.  Please call us 2 weeks after your sleep test for results and recommendations  You can continue to use th CPAP machine you have now from [employer], until we get new information  You can bring a sleep aid like Tylenol PM or Zzquil to help you sleep at the Sleep Center if you wish.

## 2022-05-11 DIAGNOSIS — K5900 Constipation, unspecified: Secondary | ICD-10-CM | POA: Diagnosis not present

## 2022-05-11 DIAGNOSIS — M6281 Muscle weakness (generalized): Secondary | ICD-10-CM | POA: Diagnosis not present

## 2022-05-11 DIAGNOSIS — I10 Essential (primary) hypertension: Secondary | ICD-10-CM | POA: Diagnosis not present

## 2022-05-11 DIAGNOSIS — K219 Gastro-esophageal reflux disease without esophagitis: Secondary | ICD-10-CM | POA: Diagnosis not present

## 2022-05-11 DIAGNOSIS — I509 Heart failure, unspecified: Secondary | ICD-10-CM | POA: Diagnosis not present

## 2022-05-11 DIAGNOSIS — L03115 Cellulitis of right lower limb: Secondary | ICD-10-CM | POA: Diagnosis not present

## 2022-05-11 DIAGNOSIS — L03116 Cellulitis of left lower limb: Secondary | ICD-10-CM | POA: Diagnosis not present

## 2022-05-11 DIAGNOSIS — E559 Vitamin D deficiency, unspecified: Secondary | ICD-10-CM | POA: Diagnosis not present

## 2022-05-13 ENCOUNTER — Other Ambulatory Visit: Payer: Self-pay | Admitting: Adult Health

## 2022-05-13 DIAGNOSIS — K219 Gastro-esophageal reflux disease without esophagitis: Secondary | ICD-10-CM | POA: Diagnosis not present

## 2022-05-13 DIAGNOSIS — L03116 Cellulitis of left lower limb: Secondary | ICD-10-CM | POA: Diagnosis not present

## 2022-05-13 DIAGNOSIS — E559 Vitamin D deficiency, unspecified: Secondary | ICD-10-CM | POA: Diagnosis not present

## 2022-05-13 DIAGNOSIS — I509 Heart failure, unspecified: Secondary | ICD-10-CM | POA: Diagnosis not present

## 2022-05-13 DIAGNOSIS — M6281 Muscle weakness (generalized): Secondary | ICD-10-CM | POA: Diagnosis not present

## 2022-05-13 DIAGNOSIS — L03115 Cellulitis of right lower limb: Secondary | ICD-10-CM | POA: Diagnosis not present

## 2022-05-13 DIAGNOSIS — I10 Essential (primary) hypertension: Secondary | ICD-10-CM | POA: Diagnosis not present

## 2022-05-13 DIAGNOSIS — K5900 Constipation, unspecified: Secondary | ICD-10-CM | POA: Diagnosis not present

## 2022-05-13 NOTE — Telephone Encounter (Signed)
Patient need to schedule a CPE ov for more refills.

## 2022-05-16 ENCOUNTER — Other Ambulatory Visit (HOSPITAL_COMMUNITY): Payer: Self-pay

## 2022-05-16 DIAGNOSIS — E876 Hypokalemia: Secondary | ICD-10-CM | POA: Diagnosis not present

## 2022-05-16 DIAGNOSIS — I110 Hypertensive heart disease with heart failure: Secondary | ICD-10-CM | POA: Diagnosis not present

## 2022-05-16 DIAGNOSIS — D519 Vitamin B12 deficiency anemia, unspecified: Secondary | ICD-10-CM | POA: Diagnosis not present

## 2022-05-16 DIAGNOSIS — I890 Lymphedema, not elsewhere classified: Secondary | ICD-10-CM | POA: Diagnosis not present

## 2022-05-16 DIAGNOSIS — E559 Vitamin D deficiency, unspecified: Secondary | ICD-10-CM | POA: Diagnosis not present

## 2022-05-16 DIAGNOSIS — G4733 Obstructive sleep apnea (adult) (pediatric): Secondary | ICD-10-CM | POA: Diagnosis not present

## 2022-05-16 DIAGNOSIS — J9612 Chronic respiratory failure with hypercapnia: Secondary | ICD-10-CM | POA: Diagnosis not present

## 2022-05-16 DIAGNOSIS — I5033 Acute on chronic diastolic (congestive) heart failure: Secondary | ICD-10-CM | POA: Diagnosis not present

## 2022-05-17 NOTE — Assessment & Plan Note (Addendum)
Can we get her sleep study done we will be better able to assess her nighttime oxygen need as supplement to CPAP or BiPAP. Plan-schedule split-night sleep study anticipating CPAP-BiPAP titration.

## 2022-05-17 NOTE — Assessment & Plan Note (Signed)
Pending Cardiology evaluation.

## 2022-05-17 NOTE — Assessment & Plan Note (Signed)
Chronic peripheral edema.  Has been referred to [facility] lymphedema clinic.  Has lymphedema pump.

## 2022-05-18 ENCOUNTER — Telehealth: Payer: Self-pay | Admitting: Adult Health

## 2022-05-18 NOTE — Telephone Encounter (Signed)
Gracee PT with [facility] is calling and need VO for PT 1x1, 2x4 and then 1x4

## 2022-05-18 NOTE — Telephone Encounter (Signed)
Okay for verbal orders? Please advise 

## 2022-05-18 NOTE — Telephone Encounter (Signed)
Left message to return phone call.

## 2022-05-19 NOTE — Telephone Encounter (Signed)
Verbal orders given to Gracee. 

## 2022-05-24 ENCOUNTER — Inpatient Hospital Stay: Payer: Medicare HMO | Admitting: Adult Health

## 2022-05-24 DIAGNOSIS — E559 Vitamin D deficiency, unspecified: Secondary | ICD-10-CM | POA: Diagnosis not present

## 2022-05-24 DIAGNOSIS — I5033 Acute on chronic diastolic (congestive) heart failure: Secondary | ICD-10-CM | POA: Diagnosis not present

## 2022-05-24 DIAGNOSIS — I890 Lymphedema, not elsewhere classified: Secondary | ICD-10-CM | POA: Diagnosis not present

## 2022-05-24 DIAGNOSIS — D519 Vitamin B12 deficiency anemia, unspecified: Secondary | ICD-10-CM | POA: Diagnosis not present

## 2022-05-24 DIAGNOSIS — G4733 Obstructive sleep apnea (adult) (pediatric): Secondary | ICD-10-CM | POA: Diagnosis not present

## 2022-05-24 DIAGNOSIS — I110 Hypertensive heart disease with heart failure: Secondary | ICD-10-CM | POA: Diagnosis not present

## 2022-05-24 DIAGNOSIS — E876 Hypokalemia: Secondary | ICD-10-CM | POA: Diagnosis not present

## 2022-05-24 DIAGNOSIS — J9612 Chronic respiratory failure with hypercapnia: Secondary | ICD-10-CM | POA: Diagnosis not present

## 2022-05-30 DIAGNOSIS — I890 Lymphedema, not elsewhere classified: Secondary | ICD-10-CM | POA: Diagnosis not present

## 2022-05-30 DIAGNOSIS — E559 Vitamin D deficiency, unspecified: Secondary | ICD-10-CM | POA: Diagnosis not present

## 2022-05-30 DIAGNOSIS — G4733 Obstructive sleep apnea (adult) (pediatric): Secondary | ICD-10-CM | POA: Diagnosis not present

## 2022-05-30 DIAGNOSIS — I5033 Acute on chronic diastolic (congestive) heart failure: Secondary | ICD-10-CM | POA: Diagnosis not present

## 2022-05-30 DIAGNOSIS — D519 Vitamin B12 deficiency anemia, unspecified: Secondary | ICD-10-CM | POA: Diagnosis not present

## 2022-05-30 DIAGNOSIS — I110 Hypertensive heart disease with heart failure: Secondary | ICD-10-CM | POA: Diagnosis not present

## 2022-05-30 DIAGNOSIS — J9612 Chronic respiratory failure with hypercapnia: Secondary | ICD-10-CM | POA: Diagnosis not present

## 2022-05-30 DIAGNOSIS — E876 Hypokalemia: Secondary | ICD-10-CM | POA: Diagnosis not present

## 2022-05-31 ENCOUNTER — Inpatient Hospital Stay: Payer: Medicare HMO | Admitting: Adult Health

## 2022-06-06 DIAGNOSIS — I890 Lymphedema, not elsewhere classified: Secondary | ICD-10-CM | POA: Diagnosis not present

## 2022-06-06 DIAGNOSIS — G4733 Obstructive sleep apnea (adult) (pediatric): Secondary | ICD-10-CM | POA: Diagnosis not present

## 2022-06-06 DIAGNOSIS — D519 Vitamin B12 deficiency anemia, unspecified: Secondary | ICD-10-CM | POA: Diagnosis not present

## 2022-06-06 DIAGNOSIS — J9612 Chronic respiratory failure with hypercapnia: Secondary | ICD-10-CM | POA: Diagnosis not present

## 2022-06-06 DIAGNOSIS — I110 Hypertensive heart disease with heart failure: Secondary | ICD-10-CM | POA: Diagnosis not present

## 2022-06-06 DIAGNOSIS — E559 Vitamin D deficiency, unspecified: Secondary | ICD-10-CM | POA: Diagnosis not present

## 2022-06-06 DIAGNOSIS — E876 Hypokalemia: Secondary | ICD-10-CM | POA: Diagnosis not present

## 2022-06-06 DIAGNOSIS — I5033 Acute on chronic diastolic (congestive) heart failure: Secondary | ICD-10-CM | POA: Diagnosis not present

## 2022-06-07 ENCOUNTER — Inpatient Hospital Stay: Payer: Medicare HMO | Admitting: Adult Health

## 2022-06-07 NOTE — Progress Notes (Deleted)
   Subjective:    Patient ID: Allison Short, female    DOB: Mar 02, 1950, 72 y.o.   MRN: [id number]  HPI 72 year old female who  has a past medical history of Claustrophobia, History of blood transfusion, History of urinary tract infection, Hypertension, Non Hodgkin's lymphoma (2008), Obesity, Osteopenia (04/2017), and Pancreatitis.  She presents to the office today for TCM visit   Admit Date 04/08/2022 Discharge Date 04/29/2022 Discharge from SNF:    She is being evaluated today after recent hospital admission with discharge to SNF after she presented to the emergency room with leg swelling and redness.  She was diagnosed with cellulitis of her left lower extremity with chronic lymphedema.  She was admitted and treated with antibiotics and her symptoms improved.  She did have some residual redness at discharge but no WBC and no fever.  She was advised to return to the lymphedema clinic after discharge  Was also diagnosed with chronic respiratory failure and was noted incidentally to have nocturnal desaturations and hypercapnia.  Pulmonary were consulted who recommended NIV but due to insurance barriers NIV cannot be provided at [facility].  Pulmonary then recommended CPAP SNF and follow-up with pulmonary.  Her and her hospital admission her chest x-ray showed cardiomegaly and edema she was diuresed 22 L but still endorsed dyspnea on exertion.  She completed treatment at skilled nursing facility we are to refer her to cardiology for further evaluation.  She was discharged on Lasix daily  Additionally she was found to have a B12 deficiency anemia and discharged on B12 supplementation.    Review of Systems     Objective:   Physical Exam        Assessment & Plan:

## 2022-06-14 ENCOUNTER — Other Ambulatory Visit (INDEPENDENT_AMBULATORY_CARE_PROVIDER_SITE_OTHER): Payer: Medicare HMO

## 2022-06-14 ENCOUNTER — Ambulatory Visit (INDEPENDENT_AMBULATORY_CARE_PROVIDER_SITE_OTHER): Payer: Medicare HMO | Admitting: Adult Health

## 2022-06-14 VITALS — BP 128/78 | HR 88 | Temp 97.9°F

## 2022-06-14 DIAGNOSIS — I890 Lymphedema, not elsewhere classified: Secondary | ICD-10-CM

## 2022-06-14 DIAGNOSIS — I10 Essential (primary) hypertension: Secondary | ICD-10-CM | POA: Diagnosis not present

## 2022-06-14 DIAGNOSIS — J9612 Chronic respiratory failure with hypercapnia: Secondary | ICD-10-CM

## 2022-06-14 DIAGNOSIS — I5033 Acute on chronic diastolic (congestive) heart failure: Secondary | ICD-10-CM | POA: Diagnosis not present

## 2022-06-14 DIAGNOSIS — R7309 Other abnormal glucose: Secondary | ICD-10-CM | POA: Diagnosis not present

## 2022-06-14 DIAGNOSIS — L03115 Cellulitis of right lower limb: Secondary | ICD-10-CM

## 2022-06-14 DIAGNOSIS — R6889 Other general symptoms and signs: Secondary | ICD-10-CM | POA: Diagnosis not present

## 2022-06-14 DIAGNOSIS — E538 Deficiency of other specified B group vitamins: Secondary | ICD-10-CM | POA: Diagnosis not present

## 2022-06-14 DIAGNOSIS — F419 Anxiety disorder, unspecified: Secondary | ICD-10-CM

## 2022-06-14 LAB — CBC WITH DIFFERENTIAL/PLATELET
Basophils Absolute: 0 10*3/uL (ref 0.0–0.1)
Basophils Relative: 0.5 % (ref 0.0–3.0)
Eosinophils Absolute: 0.1 10*3/uL (ref 0.0–0.7)
Eosinophils Relative: 1.4 % (ref 0.0–5.0)
HCT: 39.9 % (ref 36.0–46.0)
Hemoglobin: 13.2 g/dL (ref 12.0–15.0)
Lymphocytes Relative: 13.5 % (ref 12.0–46.0)
Lymphs Abs: 1.3 10*3/uL (ref 0.7–4.0)
MCHC: 33.2 g/dL (ref 30.0–36.0)
MCV: 96.8 fl (ref 78.0–100.0)
Monocytes Absolute: 0.5 10*3/uL (ref 0.1–1.0)
Monocytes Relative: 5.5 % (ref 3.0–12.0)
Neutro Abs: 7.8 10*3/uL — ABNORMAL HIGH (ref 1.4–7.7)
Neutrophils Relative %: 79.1 % — ABNORMAL HIGH (ref 43.0–77.0)
Platelets: 286 10*3/uL (ref 150.0–400.0)
RBC: 4.12 Mil/uL (ref 3.87–5.11)
RDW: 14.7 % (ref 11.5–15.5)
WBC: 9.8 10*3/uL (ref 4.0–10.5)

## 2022-06-14 MED ORDER — HYDROXYZINE HCL 25 MG PO TABS: 25.0000 mg | ORAL_TABLET | Freq: Every evening | ORAL | 1 refills | Status: AC | PRN

## 2022-06-14 MED ORDER — POTASSIUM CHLORIDE CRYS ER 10 MEQ PO TBCR: 10.0000 meq | EXTENDED_RELEASE_TABLET | Freq: Every day | ORAL | 1 refills | Status: DC

## 2022-06-14 MED ORDER — METOPROLOL TARTRATE 50 MG PO TABS: 25.0000 mg | ORAL_TABLET | Freq: Two times a day (BID) | ORAL | 1 refills | Status: DC

## 2022-06-14 MED ORDER — FUROSEMIDE 20 MG PO TABS: 20.0000 mg | ORAL_TABLET | Freq: Every day | ORAL | 1 refills | Status: DC

## 2022-06-14 NOTE — Progress Notes (Signed)
Subjective:    Patient ID: Allison Short, female    DOB: [birth year], 72 y.o.   MRN: [id number]  HPI 72 year old female who  has a past medical history of Claustrophobia, History of blood transfusion, History of urinary tract infection, Hypertension, Non Hodgkin's lymphoma (2008), Obesity, Osteopenia (04/2017), and Pancreatitis.  She presents to the office today for TCM visit   Admit Date 04/08/2022 Discharge from [facility] 04/29/2022 Discharge From SNF 05/13/2022  She presented to the emergency room with leg swelling and redness.  She was diagnosed with cellulitis of her left lower extremity with chronic lymphedema.  She was admitted and treated with antibiotics and her symptoms improved.  She did have some residual redness at discharge but no WBC and no fever.  She was advised to return to the lymphedema clinic after discharge  Was also diagnosed with chronic respiratory failure and was noted incidentally to have nocturnal desaturations and hypercapnia.  Pulmonary were consulted who recommended NIV but due to insurance barriers NIV cannot be provided at [facility].  Pulmonary then recommended CPAP SNF and follow-up with pulmonary.  Her and her hospital admission her chest x-ray showed cardiomegaly and edema she was diuresed 22 L but still endorsed dyspnea on exertion.  She completed treatment at skilled nursing facility we are to refer her to cardiology for further evaluation.  She was discharged on Lasix daily  Additionally she was found to have a B12 deficiency anemia and discharged on B12 supplementation.  Today she reports that she has been doing well except for her sleep. Since she has been home without her CPAP she has been having trouble sleeping. She has been sleeping in her recliner.  She was tomorrow to pick up her CPAP.  She is using her lymphedema pump as directed and wearing her compression socks.   She has been monitoring her weight at home and her weight has been steadily between 357  and 359 pounds.  She is Taking  her Lasix as directed.  Also has been monitoring her blood pressure at home, physical therapy does this when they come over a couple of times a week.  Blood pressure has been stable at 128/78.  Inhibitor and HCTZ held during admission and was not resumed on discharge due to blood pressure being stable on metoprolol and Lopressor.  She is taking a B12 supplement   Has found that Atarax that was prescribed in the hospital has helped with her evening anxiety due to not being able to sleep.   Review of Systems  Constitutional: Negative.      Past Medical History:  Diagnosis Date   Claustrophobia    History of blood transfusion    2016   History of urinary tract infection    Hypertension    Non Hodgkin's lymphoma 2008   Obesity    Osteopenia 04/2017   T score -1.4 FRAX 7.5% / 0.8%   Pancreatitis     Social History   Socioeconomic History   Marital status: Divorced    Spouse name: Not on file   Number of children: Not on file   Years of education: Not on file   Highest education level: Some college, no degree  Occupational History   Not on file  Tobacco Use   Smoking status: Never   Smokeless tobacco: Never  Vaping Use   Vaping Use: Never used  Substance and Sexual Activity   Alcohol use: Yes    Alcohol/week: 0.0 standard drinks of alcohol  Comment: Occas   Drug use: No   Sexual activity: Not Currently    Comment: 1st intercourse 15 yo-5 partners  Other Topics Concern   Not on file  Social History Narrative   Not on file   Social Determinants of Health   Financial Resource Strain: Medium Risk (06/14/2022)   Overall Financial Resource Strain (CARDIA)    Difficulty of Paying Living Expenses: Somewhat hard  Food Insecurity: Food Insecurity Present (06/14/2022)   Hunger Vital Sign    Worried About Running Out of Food in the Last Year: Sometimes true    Ran Out of Food in the Last Year: Never true  Transportation Needs: Unmet  Transportation Needs (06/14/2022)   PRAPARE - Administrator, Civil Service (Medical): Yes    Lack of Transportation (Non-Medical): No  Physical Activity: Inactive (06/14/2022)   Exercise Vital Sign    Days of Exercise per Week: 0 days    Minutes of Exercise per Session: 0 min  Stress: Stress Concern Present (06/14/2022)   [employer] of Occupational Health - Occupational Stress Questionnaire    Feeling of Stress : To some extent  Social Connections: Unknown (06/14/2022)   Social Connection and Isolation Panel [NHANES]    Frequency of Communication with Friends and Family: Twice a week    Frequency of Social Gatherings with Friends and Family: Patient declined    Attends Religious Services: Never    Database administrator or Organizations: No    Attends Engineer, structural: Never    Marital Status: Divorced  Recent Concern: Social Connections - Socially Isolated (06/06/2022)   Social Connection and Isolation Panel [NHANES]    Frequency of Communication with Friends and Family: Twice a week    Frequency of Social Gatherings with Friends and Family: Never    Attends Religious Services: Never    Database administrator or Organizations: No    Attends Banker Meetings: Never    Marital Status: Divorced  Catering manager Violence: Not At Risk (04/08/2022)   Humiliation, Afraid, Rape, and Kick questionnaire    Fear of Current or Ex-Partner: No    Emotionally Abused: No    Physically Abused: No    Sexually Abused: No    Past Surgical History:  Procedure Laterality Date   BIOPSY THYROID     BONE MARROW BIOPSY     CESAREAN SECTION  10/26/1975   CHOLECYSTECTOMY     2006   PORTA CATH INSERTION     TONSILLECTOMY AND ADENOIDECTOMY     1952   TOTAL KNEE ARTHROPLASTY Bilateral    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      Family History  Problem Relation Age of Onset   Arthritis Maternal Grandmother    Diabetes Son     Allergies  Allergen Reactions   Ciprofloxin  Hcl [Ciprofloxacin] Other (See Comments)    Unstable gait   Morphine And Related Anaphylaxis and Other (See Comments)    Tolerates hydrocodone   Asa [Aspirin] Hives and Other (See Comments)    Hives can become SEVERE, requiring intervention   Codeine Hives   Chia Oil Swelling and Other (See Comments)    Possibly "chia tea" = Face and lips became swollen   Erythromycin Other (See Comments)    Reaction not recalled   Nsaids Other (See Comments)    Lowers GFR   Penicillins Other (See Comments)    Reaction not recalled- was told to "never take this again" by family  Current Outpatient Medications on File Prior to Visit  Medication Sig Dispense Refill   acetaminophen (TYLENOL) 500 MG tablet Take 1,000 mg by mouth every 8 (eight) hours as needed for mild pain, moderate pain or headache.     Cholecalciferol (VITAMIN D3) 25 MCG (1000 UT) capsule Take 1,000 Units by mouth daily.     cyanocobalamin (VITAMIN B12) 1000 MCG tablet Take 1 tablet (1,000 mcg total) by mouth daily.     fluticasone (FLONASE) 50 MCG/ACT nasal spray Place 1 spray into both nostrils daily. 9.9 mL 2   furosemide (LASIX) 20 MG tablet Take 1 tablet (20 mg total) by mouth daily. 30 tablet 4   hydrOXYzine (ATARAX) 25 MG tablet Take 1 tablet (25 mg total) by mouth 3 (three) times daily as needed for anxiety. 20 tablet 0   ipratropium-albuterol (DUONEB) 0.5-2.5 (3) MG/3ML SOLN Take 3 mLs by nebulization every 6 (six) hours as needed. 1080 mL 1   metoprolol tartrate (LOPRESSOR) 50 MG tablet Take 0.5 tablets (25 mg total) by mouth 2 (two) times daily. KEEP APPT FOR REFILLS 90 tablet 0   MUCINEX SINUS-MAX CLEAR & COOL 0.05 % nasal spray Place 1 spray into both nostrils 2 (two) times daily as needed for congestion.     potassium chloride (KLOR-CON M) 10 MEQ tablet Take 1 tablet (10 mEq total) by mouth daily. 30 tablet 4   sodium chloride (OCEAN) 0.65 % SOLN nasal spray Place 1 spray into both nostrils as needed for congestion.      UNKNOWN TO PATIENT Take 0.5-1 ampules by nebulization See admin instructions. Unnamed neb treatment for post-nasal drip and coughing- Nebulize the contents of 0.5-1 ampule and inhale into the lungs once a day as needed for mucus removal or coughing     No current facility-administered medications on file prior to visit.    BP 128/78   Pulse 88   Temp 97.9 F (36.6 C) (Oral)   SpO2 95%       Objective:   Physical Exam Vitals and nursing note reviewed.  Constitutional:      Appearance: Normal appearance. She is obese.  Cardiovascular:     Rate and Rhythm: Normal rate and regular rhythm.     Pulses: Normal pulses.     Heart sounds: Normal heart sounds.  Pulmonary:     Effort: Pulmonary effort is normal.     Breath sounds: Normal breath sounds.  Musculoskeletal:     Comments: Lymphedema noted to bilateral lower extremities  Skin:    General: Skin is warm and dry.  Neurological:     Mental Status: She is alert.  Psychiatric:        Mood and Affect: Mood normal.        Behavior: Behavior normal.        Thought Content: Thought content normal.        Judgment: Judgment normal.       Assessment & Plan:  1. Cellulitis of right lower extremity - Resolved  - Basic Metabolic Panel; Future - CBC with Differential/Platelet; Future - Vitamin B12; Future - CBC with Differential/Platelet - Basic Metabolic Panel  2. Chronic respiratory failure with hypercapnia due to obesity hypoventilation syndrome and obstructive sleep apnea - Resolved.  - CPAP being picked up tomorrow  - Basic Metabolic Panel; Future - CBC with Differential/Platelet; Future - Vitamin B12; Future - CBC with Differential/Platelet - Basic Metabolic Panel  3. Acute on chronic diastolic CHF (congestive heart failure) - Referral to Cardiology placed  -  Basic Metabolic Panel; Future - CBC with Differential/Platelet; Future - Vitamin B12; Future - CBC with Differential/Platelet - Basic Metabolic Panel  4.  Essential hypertension - Controlled. No change in medication  - furosemide (LASIX) 20 MG tablet; Take 1 tablet (20 mg total) by mouth daily.  Dispense: 90 tablet; Refill: 1 - metoprolol tartrate (LOPRESSOR) 50 MG tablet; Take 0.5 tablets (25 mg total) by mouth 2 (two) times daily. KEEP APPT FOR REFILLS  Dispense: 90 tablet; Refill: 1 - potassium chloride (KLOR-CON M) 10 MEQ tablet; Take 1 tablet (10 mEq total) by mouth daily.  Dispense: 90 tablet; Refill: 1  5. B12 deficiency  - Vitamin B12; Future - Vitamin B12  6. Elevated glucose  - Basic Metabolic Panel; Future - CBC with Differential/Platelet; Future - CBC with Differential/Platelet - Basic Metabolic Panel - Hemoglobin A1c; Future  7. Lymphedema of both lower extremities - Continue with pumps and compression socks. She does not want to return to lymphedema clinic   8. Morbid obesity - She is eating healthier.  - Will check labs. Consider GLP1 - Basic Metabolic Panel; Future - CBC with Differential/Platelet; Future - Vitamin B12; Future - CBC with Differential/Platelet - Basic Metabolic Panel - Hemoglobin A1c; Future  9. Anxiety  - hydrOXYzine (ATARAX) 25 MG tablet; Take 1 tablet (25 mg total) by mouth at bedtime as needed for anxiety.  Dispense: 90 tablet; Refill: 1  Time spent with patient today was 53 minutes which consisted of chart review, discussing OSA, CHF, Obesity, HTN, lymphedema, and cellulitis.  work up, treatment answering questions and documentation.

## 2022-06-15 ENCOUNTER — Other Ambulatory Visit: Payer: Self-pay | Admitting: Adult Health

## 2022-06-15 DIAGNOSIS — G4733 Obstructive sleep apnea (adult) (pediatric): Secondary | ICD-10-CM | POA: Diagnosis not present

## 2022-06-15 DIAGNOSIS — D519 Vitamin B12 deficiency anemia, unspecified: Secondary | ICD-10-CM | POA: Diagnosis not present

## 2022-06-15 DIAGNOSIS — J9612 Chronic respiratory failure with hypercapnia: Secondary | ICD-10-CM | POA: Diagnosis not present

## 2022-06-15 DIAGNOSIS — I890 Lymphedema, not elsewhere classified: Secondary | ICD-10-CM | POA: Diagnosis not present

## 2022-06-15 DIAGNOSIS — E876 Hypokalemia: Secondary | ICD-10-CM | POA: Diagnosis not present

## 2022-06-15 DIAGNOSIS — E559 Vitamin D deficiency, unspecified: Secondary | ICD-10-CM | POA: Diagnosis not present

## 2022-06-15 DIAGNOSIS — I5033 Acute on chronic diastolic (congestive) heart failure: Secondary | ICD-10-CM | POA: Diagnosis not present

## 2022-06-15 DIAGNOSIS — I110 Hypertensive heart disease with heart failure: Secondary | ICD-10-CM | POA: Diagnosis not present

## 2022-06-15 LAB — BASIC METABOLIC PANEL
BUN: 20 mg/dL (ref 6–23)
CO2: 25 mEq/L (ref 19–32)
Calcium: 9.7 mg/dL (ref 8.4–10.5)
Chloride: 101 mEq/L (ref 96–112)
Creatinine, Ser: 0.97 mg/dL (ref 0.40–1.20)
GFR: 57.81 mL/min — ABNORMAL LOW (ref >60.00)
Glucose, Bld: 112 mg/dL — ABNORMAL HIGH (ref 70–99)
Potassium: 3.6 mEq/L (ref 3.5–5.1)
Sodium: 140 mEq/L (ref 135–145)

## 2022-06-15 LAB — VITAMIN B12: Vitamin B-12: >1500 pg/mL — ABNORMAL HIGH (ref 211–911)

## 2022-06-15 LAB — HEMOGLOBIN A1C: Hgb A1c MFr Bld: 6.4 % (ref 4.6–6.5)

## 2022-06-16 ENCOUNTER — Telehealth: Payer: Self-pay | Admitting: Adult Health

## 2022-06-16 DIAGNOSIS — E876 Hypokalemia: Secondary | ICD-10-CM | POA: Diagnosis not present

## 2022-06-16 DIAGNOSIS — J9612 Chronic respiratory failure with hypercapnia: Secondary | ICD-10-CM | POA: Diagnosis not present

## 2022-06-16 DIAGNOSIS — I890 Lymphedema, not elsewhere classified: Secondary | ICD-10-CM | POA: Diagnosis not present

## 2022-06-16 DIAGNOSIS — I110 Hypertensive heart disease with heart failure: Secondary | ICD-10-CM | POA: Diagnosis not present

## 2022-06-16 DIAGNOSIS — I5033 Acute on chronic diastolic (congestive) heart failure: Secondary | ICD-10-CM | POA: Diagnosis not present

## 2022-06-16 DIAGNOSIS — D519 Vitamin B12 deficiency anemia, unspecified: Secondary | ICD-10-CM | POA: Diagnosis not present

## 2022-06-16 DIAGNOSIS — E559 Vitamin D deficiency, unspecified: Secondary | ICD-10-CM | POA: Diagnosis not present

## 2022-06-16 DIAGNOSIS — G4733 Obstructive sleep apnea (adult) (pediatric): Secondary | ICD-10-CM | POA: Diagnosis not present

## 2022-06-16 NOTE — Telephone Encounter (Signed)
Okay for verbal orders? Please advise 

## 2022-06-16 NOTE — Telephone Encounter (Signed)
[facility] Forensic scientist is calling and would like VO for SN to evaluate pt legs due to swollen, cellulitis recently and history of lymphedema

## 2022-06-17 MED ORDER — METFORMIN HCL 500 MG PO TABS: 500.0000 mg | ORAL_TABLET | Freq: Two times a day (BID) | ORAL | 0 refills | Status: DC

## 2022-06-17 NOTE — Telephone Encounter (Signed)
Verbal orders given to [facility]

## 2022-06-17 NOTE — Telephone Encounter (Signed)
[  10:16 AM] Alwyn Ren, you have Allison Short with [facility] calling you back [10:18 AM] Suzette Battiest [phone]

## 2022-06-17 NOTE — Telephone Encounter (Signed)
Left message for Allison Short [facility] to return phone call.

## 2022-06-17 NOTE — Addendum Note (Signed)
Addended by: Waymon Amato R on: 06/17/2022 08:55 AM   Modules accepted: Orders

## 2022-06-21 DIAGNOSIS — E876 Hypokalemia: Secondary | ICD-10-CM | POA: Diagnosis not present

## 2022-06-21 DIAGNOSIS — G4733 Obstructive sleep apnea (adult) (pediatric): Secondary | ICD-10-CM | POA: Diagnosis not present

## 2022-06-21 DIAGNOSIS — I890 Lymphedema, not elsewhere classified: Secondary | ICD-10-CM | POA: Diagnosis not present

## 2022-06-21 DIAGNOSIS — J9612 Chronic respiratory failure with hypercapnia: Secondary | ICD-10-CM | POA: Diagnosis not present

## 2022-06-21 DIAGNOSIS — D519 Vitamin B12 deficiency anemia, unspecified: Secondary | ICD-10-CM | POA: Diagnosis not present

## 2022-06-21 DIAGNOSIS — I5033 Acute on chronic diastolic (congestive) heart failure: Secondary | ICD-10-CM | POA: Diagnosis not present

## 2022-06-21 DIAGNOSIS — I110 Hypertensive heart disease with heart failure: Secondary | ICD-10-CM | POA: Diagnosis not present

## 2022-06-21 DIAGNOSIS — E559 Vitamin D deficiency, unspecified: Secondary | ICD-10-CM | POA: Diagnosis not present

## 2022-06-22 DIAGNOSIS — I890 Lymphedema, not elsewhere classified: Secondary | ICD-10-CM | POA: Diagnosis not present

## 2022-06-22 DIAGNOSIS — E876 Hypokalemia: Secondary | ICD-10-CM | POA: Diagnosis not present

## 2022-06-22 DIAGNOSIS — J9612 Chronic respiratory failure with hypercapnia: Secondary | ICD-10-CM | POA: Diagnosis not present

## 2022-06-22 DIAGNOSIS — I110 Hypertensive heart disease with heart failure: Secondary | ICD-10-CM | POA: Diagnosis not present

## 2022-06-22 DIAGNOSIS — I5033 Acute on chronic diastolic (congestive) heart failure: Secondary | ICD-10-CM | POA: Diagnosis not present

## 2022-06-22 DIAGNOSIS — G4733 Obstructive sleep apnea (adult) (pediatric): Secondary | ICD-10-CM | POA: Diagnosis not present

## 2022-06-22 DIAGNOSIS — E559 Vitamin D deficiency, unspecified: Secondary | ICD-10-CM | POA: Diagnosis not present

## 2022-06-22 DIAGNOSIS — D519 Vitamin B12 deficiency anemia, unspecified: Secondary | ICD-10-CM | POA: Diagnosis not present

## 2022-06-24 ENCOUNTER — Telehealth: Payer: Self-pay | Admitting: Adult Health

## 2022-06-24 NOTE — Telephone Encounter (Signed)
[place] rn with [facility] is calling and need VO for SN 1x3 for lymphedema and also pt has open wound on right ankle and she would like to use silver alginate change daily

## 2022-06-24 NOTE — Telephone Encounter (Signed)
Okay for verbal orders? Please advise 

## 2022-06-24 NOTE — Telephone Encounter (Signed)
Lm for [place] RN to return call.

## 2022-06-24 NOTE — Telephone Encounter (Signed)
Verbal orders given to  [place].

## 2022-06-29 DIAGNOSIS — G4733 Obstructive sleep apnea (adult) (pediatric): Secondary | ICD-10-CM | POA: Diagnosis not present

## 2022-06-29 DIAGNOSIS — I5033 Acute on chronic diastolic (congestive) heart failure: Secondary | ICD-10-CM | POA: Diagnosis not present

## 2022-06-29 DIAGNOSIS — E559 Vitamin D deficiency, unspecified: Secondary | ICD-10-CM | POA: Diagnosis not present

## 2022-06-29 DIAGNOSIS — E876 Hypokalemia: Secondary | ICD-10-CM | POA: Diagnosis not present

## 2022-06-29 DIAGNOSIS — I890 Lymphedema, not elsewhere classified: Secondary | ICD-10-CM | POA: Diagnosis not present

## 2022-06-29 DIAGNOSIS — D519 Vitamin B12 deficiency anemia, unspecified: Secondary | ICD-10-CM | POA: Diagnosis not present

## 2022-06-29 DIAGNOSIS — I110 Hypertensive heart disease with heart failure: Secondary | ICD-10-CM | POA: Diagnosis not present

## 2022-06-29 DIAGNOSIS — J9612 Chronic respiratory failure with hypercapnia: Secondary | ICD-10-CM | POA: Diagnosis not present

## 2022-07-01 ENCOUNTER — Telehealth: Payer: Self-pay | Admitting: Internal Medicine

## 2022-07-01 DIAGNOSIS — E559 Vitamin D deficiency, unspecified: Secondary | ICD-10-CM | POA: Diagnosis not present

## 2022-07-01 DIAGNOSIS — D519 Vitamin B12 deficiency anemia, unspecified: Secondary | ICD-10-CM | POA: Diagnosis not present

## 2022-07-01 DIAGNOSIS — I5033 Acute on chronic diastolic (congestive) heart failure: Secondary | ICD-10-CM | POA: Diagnosis not present

## 2022-07-01 DIAGNOSIS — E876 Hypokalemia: Secondary | ICD-10-CM | POA: Diagnosis not present

## 2022-07-01 DIAGNOSIS — J9612 Chronic respiratory failure with hypercapnia: Secondary | ICD-10-CM | POA: Diagnosis not present

## 2022-07-01 DIAGNOSIS — I110 Hypertensive heart disease with heart failure: Secondary | ICD-10-CM | POA: Diagnosis not present

## 2022-07-01 DIAGNOSIS — I890 Lymphedema, not elsewhere classified: Secondary | ICD-10-CM | POA: Diagnosis not present

## 2022-07-01 DIAGNOSIS — G4733 Obstructive sleep apnea (adult) (pediatric): Secondary | ICD-10-CM | POA: Diagnosis not present

## 2022-07-01 NOTE — Telephone Encounter (Signed)
Got sleep study approved and authorized  Sleep study has been cancelled per patient's request,   She has received a cpap machine and she is feeling better. FYI to provider

## 2022-07-05 ENCOUNTER — Other Ambulatory Visit: Payer: Self-pay | Admitting: Adult Health

## 2022-07-05 DIAGNOSIS — I10 Essential (primary) hypertension: Secondary | ICD-10-CM

## 2022-07-05 DIAGNOSIS — E559 Vitamin D deficiency, unspecified: Secondary | ICD-10-CM | POA: Diagnosis not present

## 2022-07-05 DIAGNOSIS — I5033 Acute on chronic diastolic (congestive) heart failure: Secondary | ICD-10-CM

## 2022-07-05 DIAGNOSIS — R7309 Other abnormal glucose: Secondary | ICD-10-CM

## 2022-07-05 DIAGNOSIS — D519 Vitamin B12 deficiency anemia, unspecified: Secondary | ICD-10-CM | POA: Diagnosis not present

## 2022-07-05 DIAGNOSIS — J9612 Chronic respiratory failure with hypercapnia: Secondary | ICD-10-CM

## 2022-07-05 DIAGNOSIS — G4733 Obstructive sleep apnea (adult) (pediatric): Secondary | ICD-10-CM | POA: Diagnosis not present

## 2022-07-05 DIAGNOSIS — I110 Hypertensive heart disease with heart failure: Secondary | ICD-10-CM | POA: Diagnosis not present

## 2022-07-05 DIAGNOSIS — I890 Lymphedema, not elsewhere classified: Secondary | ICD-10-CM | POA: Diagnosis not present

## 2022-07-05 DIAGNOSIS — E876 Hypokalemia: Secondary | ICD-10-CM | POA: Diagnosis not present

## 2022-07-11 ENCOUNTER — Telehealth: Payer: Self-pay

## 2022-07-11 NOTE — Progress Notes (Signed)
Care Management & Coordination Services Pharmacy Team  Reason for Encounter: Appointment Reminder  Contacted patient to confirm telephone appointment with Delano Metz, PharmD on 07/15/2022 at 11:00. Spoke with patient on 07/11/2022    Have you seen any other providers since your last visit? **Patient denies  Any changes in your medications or health? Patient denies  Any side effects from any medications? Patient denies  Do you have an symptoms or problems not managed by your medications? Patient denies  Any concerns about your health right now? Patient denies  Has your provider asked that you check blood pressure, blood sugar, or follow special diet at home? Yes, she is checking both blood pressure and blood sugar, she is also tracking what she eats.   Do you get any type of exercise on a regular basis? Yes, Patient is doing her physical therapy exercises and walking regular to build up her stamina.  Can you think of a goal you would like to reach for your health? She would like to get weight down  Do you have any problems getting your medications? Patient denies  Is there anything that you would like to discuss during the appointment? Patient would like to have a new prescription of Metoprolol, she would like this to be for 25 mg tablet as the 50 mg is very difficult to cut in half.  She needs this to be sent to [facility], she is not able to get to the local pharmacy to pick it up.   Patient advised to have medications and supplements ready for appointment   Chart review:  Recent office visits:  06/14/2022 Shirline Frees NP - Patient was seen for cellulitis of right lower extremity and additional concerns. Started Metformin 500 mg bid. Decreased Hydroxyzine 25 mg to qhs prn. Discontinued Bisacdyl, Camphor Menthol, Hydrocortisone, Loratadine, Ondansetron, Pantoprazole, Miralax, Potasium and skin protectant.   05/04/2022 Shirline Frees NP - Patient was seen for cellulitis  of right lower extremity and additional concerns. No medication changes.   Recent consult visits:  05/13/2022 [facility] - Patient was seen for cellulitis of right lower limb and additional concerns. No additional chart notes.   05/10/2022 Jetty Duhamel MD (pulmonary) - Patient was seen for Chronic respiratory failure with hypercapnia due to obesity hypoventilation syndrome and obstructive sleep apnea and additional concerns. No medication changes.   Hospital visits:  Admitted to [facility] on 04/08/2022 due to cellulitis of lower extremity with chronic lymphedema. Discharge date was 04/29/2022. Discharged from [facility].   New?Medications Started at [facility] Discharge:??  Medication Changes at Hospital Discharge:  Medications Discontinued at Hospital Discharge:  Medications that remain the same after Hospital Discharge:??  -All other medications will remain the same.     Care Gaps: AWV - completed 09/13/2021 Last BP - 128/78 on 06/14/2022 Covid - never done Tdap - never done Shingrix - never done Pneumovax - postponed Mammogram - postponed Colonoscopy - postponed Hep C Screen - postponed  Star Rating Drugs:  Metformin 500 mg - last filled 06/17/2022 90 DS at [facility]  Clinical Pharmacist Assistant [phone]

## 2022-07-12 DIAGNOSIS — D519 Vitamin B12 deficiency anemia, unspecified: Secondary | ICD-10-CM | POA: Diagnosis not present

## 2022-07-12 DIAGNOSIS — I110 Hypertensive heart disease with heart failure: Secondary | ICD-10-CM | POA: Diagnosis not present

## 2022-07-12 DIAGNOSIS — G4733 Obstructive sleep apnea (adult) (pediatric): Secondary | ICD-10-CM | POA: Diagnosis not present

## 2022-07-12 DIAGNOSIS — E876 Hypokalemia: Secondary | ICD-10-CM | POA: Diagnosis not present

## 2022-07-12 DIAGNOSIS — I5033 Acute on chronic diastolic (congestive) heart failure: Secondary | ICD-10-CM | POA: Diagnosis not present

## 2022-07-12 DIAGNOSIS — J9612 Chronic respiratory failure with hypercapnia: Secondary | ICD-10-CM | POA: Diagnosis not present

## 2022-07-12 DIAGNOSIS — I890 Lymphedema, not elsewhere classified: Secondary | ICD-10-CM | POA: Diagnosis not present

## 2022-07-12 DIAGNOSIS — E559 Vitamin D deficiency, unspecified: Secondary | ICD-10-CM | POA: Diagnosis not present

## 2022-07-13 DIAGNOSIS — I890 Lymphedema, not elsewhere classified: Secondary | ICD-10-CM | POA: Diagnosis not present

## 2022-07-13 DIAGNOSIS — E876 Hypokalemia: Secondary | ICD-10-CM | POA: Diagnosis not present

## 2022-07-13 DIAGNOSIS — D519 Vitamin B12 deficiency anemia, unspecified: Secondary | ICD-10-CM | POA: Diagnosis not present

## 2022-07-13 DIAGNOSIS — I5033 Acute on chronic diastolic (congestive) heart failure: Secondary | ICD-10-CM | POA: Diagnosis not present

## 2022-07-13 DIAGNOSIS — I110 Hypertensive heart disease with heart failure: Secondary | ICD-10-CM | POA: Diagnosis not present

## 2022-07-13 DIAGNOSIS — E559 Vitamin D deficiency, unspecified: Secondary | ICD-10-CM | POA: Diagnosis not present

## 2022-07-13 DIAGNOSIS — G4733 Obstructive sleep apnea (adult) (pediatric): Secondary | ICD-10-CM | POA: Diagnosis not present

## 2022-07-13 DIAGNOSIS — J9612 Chronic respiratory failure with hypercapnia: Secondary | ICD-10-CM | POA: Diagnosis not present

## 2022-07-13 NOTE — Progress Notes (Signed)
Care Management & Coordination Services Pharmacy Note  07/15/2022 Name:  Allison Short MRN:  [id number] DOB:  Feb 02, 1951  Summary: BP at goal <130/80 Denies any signs of fluid retention Requests order for mammogram and repeat DEXA   Recommendations/Changes made from today's visit: -Continue to check BP/HR daily and keep a log -Continue to check weight daily (watching for >3lbs gained in 1 day or >5lbs in 1 week) and be mindful of salt intake -ORDER mammogram and DEXA, with PCP approval  Follow up plan: General review in 2 months Pharmacist visit in 6 months   Subjective: Allison Short is an 72 y.o. year old female who is a primary patient of Shirline Frees, NP.  The care coordination team was consulted for assistance with disease management and care coordination needs.    Engaged with patient by telephone for initial visit.  Recent office visits: 06/14/2022 Shirline Frees NP - Patient was seen for cellulitis of right lower extremity and additional concerns. Started Metformin 500 mg bid. Decreased Hydroxyzine 25 mg to qhs prn. Discontinued Bisacdyl, Camphor Menthol, Hydrocortisone, Loratadine, Ondansetron, Pantoprazole, Miralax, Potasium and skin protectant.    05/04/2022 Shirline Frees NP - Patient was seen for cellulitis of right lower extremity and additional concerns. No medication changes.   Recent consult visits: 05/13/2022 [facility] - Patient was seen for cellulitis of right lower limb and additional concerns. No additional chart notes.    05/10/2022 Jetty Duhamel MD (pulmonary) - Patient was seen for Chronic respiratory failure with hypercapnia due to obesity hypoventilation syndrome and obstructive sleep apnea and additional concerns. No medication changes  Hospital visits: 04/08/22 [facility] - For cellulitis of lower extremity with chronic lymphedema, LOS 3 weeks. No medication changes   Objective:  Lab Results  Component Value Date   CREATININE  0.97 06/14/2022   BUN 20 06/14/2022   GFR 57.81 (L) 06/14/2022   GFRNONAA >60 04/27/2022   GFRAA 28 (L) 07/08/2015   NA 140 06/14/2022   K 3.6 06/14/2022   CALCIUM 9.7 06/14/2022   CO2 25 06/14/2022   GLUCOSE 112 (H) 06/14/2022    Lab Results  Component Value Date/Time   HGBA1C 6.4 06/14/2022 04:53 PM   HGBA1C 6.2 11/20/2018 02:15 PM   GFR 57.81 (L) 06/14/2022 02:45 PM   GFR 51.20 (L) 11/20/2018 08:33 AM    Last diabetic Eye exam: No results found for: "HMDIABEYEEXA"  Last diabetic Foot exam: No results found for: "HMDIABFOOTEX"   Lab Results  Component Value Date   CHOL 141 11/20/2018   HDL 42.60 11/20/2018   LDLCALC 82 11/20/2018   TRIG 81.0 11/20/2018   CHOLHDL 3 11/20/2018       Latest Ref Rng & Units 04/16/2022    2:48 AM 04/08/2022    7:18 PM 08/20/2020    5:16 PM  Hepatic Function  Total Protein 6.5 - 8.1 g/dL 6.6  7.1  7.1   Albumin 3.5 - 5.0 g/dL 3.0  3.9  3.6   AST 15 - 41 U/L 16  23  9    ALT 0 - 44 U/L 19  17  16    Alk Phosphatase 38 - 126 U/L 76  81  59   Total Bilirubin 0.3 - 1.2 mg/dL 0.8  1.2  0.6   Bilirubin, Direct 0.0 - 0.2 mg/dL  0.3      Lab Results  Component Value Date/Time   TSH 1.196 04/08/2022 09:11 PM   TSH 1.42 11/20/2018 08:33 AM  Latest Ref Rng & Units 06/14/2022    2:45 PM 04/27/2022    5:16 AM 04/20/2022    9:29 AM  CBC  WBC 4.0 - 10.5 K/uL 9.8  7.5  6.5   Hemoglobin 12.0 - 15.0 g/dL 16.1  09.6  04.5   Hematocrit 36.0 - 46.0 % 39.9  37.5  38.3   Platelets 150.0 - 400.0 K/uL 286.0  237  208     Lab Results  Component Value Date/Time   VITAMINB12 >1500 (H) 06/14/2022 02:45 PM   VITAMINB12 312 04/08/2022 09:11 PM    Clinical ASCVD: No  The ASCVD Risk score (Arnett DK, et al., 2019) failed to calculate for the following reasons:   Cannot find a previous HDL lab   Cannot find a previous total cholesterol lab        09/13/2021   12:39 PM 09/09/2020    1:10 PM 09/09/2020    1:05 PM  Depression screen PHQ 2/9   Decreased Interest 0 0 0  Down, Depressed, Hopeless 0 0 0  PHQ - 2 Score 0 0 0     Social History   Tobacco Use  Smoking Status Never  Smokeless Tobacco Never   BP Readings from Last 3 Encounters:  06/14/22 128/78  05/10/22 138/88  04/29/22 (!) 133/55   Pulse Readings from Last 3 Encounters:  06/14/22 88  05/10/22 78  04/29/22 (!) 57   Wt Readings from Last 3 Encounters:  05/10/22 (!) 343 lb (155.6 kg)  05/04/22 (!) 343 lb (155.6 kg)  04/13/22 (!) 369 lb 7.9 oz (167.6 kg)   BMI Readings from Last 3 Encounters:  05/10/22 52.15 kg/m  05/04/22 62.74 kg/m  04/13/22 67.58 kg/m    Allergies  Allergen Reactions   Ciprofloxin Hcl [Ciprofloxacin] Other (See Comments)    Unstable gait   Morphine And Codeine Anaphylaxis and Other (See Comments)    Tolerates hydrocodone   Asa [Aspirin] Hives and Other (See Comments)    Hives can become SEVERE, requiring intervention   Codeine Hives   Chia Oil Swelling and Other (See Comments)    Possibly "chia tea" = Face and lips became swollen   Erythromycin Other (See Comments)    Reaction not recalled   Nsaids Other (See Comments)    Lowers GFR   Penicillins Other (See Comments)    Reaction not recalled- was told to "never take this again" by family    Medications Reviewed Today     Reviewed by Sherrill Raring, RPH (Pharmacist) on 07/15/22 at 1149  Med List Status: <None>   Medication Order Taking? Sig Documenting Provider Last Dose Status Informant  acetaminophen (TYLENOL) 500 MG tablet [id number] Yes Take 1,000 mg by mouth every 8 (eight) hours as needed for mild pain, moderate pain or headache. [provider] Taking Active Self  Cholecalciferol (VITAMIN D3) 25 MCG (1000 UT) capsule [id number] Yes Take 1,000 Units by mouth daily. [provider] Taking Active Self  cyanocobalamin (VITAMIN B12) 1000 MCG tablet [id number] Yes Take 1 tablet (1,000 mcg total) by mouth daily. Rodolph Bong, MD Taking Active    furosemide (LASIX) 20 MG tablet [id number] Yes Take 1 tablet (20 mg total) by mouth daily. Nafziger, Kandee Keen, NP Taking Active   hydrOXYzine (ATARAX) 25 MG tablet [id number] Yes Take 1 tablet (25 mg total) by mouth at bedtime as needed for anxiety. Nafziger, Kandee Keen, NP Taking Active   ipratropium-albuterol (DUONEB) 0.5-2.5 (3) MG/3ML SOLN [id number] Yes Take 3 mLs by nebulization every  6 (six) hours as needed. Rodolph Bong, MD Taking Active   metFORMIN (GLUCOPHAGE) 500 MG tablet [id number] Yes Take 1 tablet (500 mg total) by mouth 2 (two) times daily with a meal. Nafziger, Kandee Keen, NP Taking Active   metoprolol tartrate (LOPRESSOR) 50 MG tablet [id number] Yes Take 0.5 tablets (25 mg total) by mouth 2 (two) times daily. KEEP APPT FOR REFILLS Nafziger, Kandee Keen, NP Taking Active   MUCINEX SINUS-MAX CLEAR & COOL 0.05 % nasal spray [id number] Yes Place 1 spray into both nostrils 2 (two) times daily as needed for congestion. [provider] Taking Active Self  potassium chloride (KLOR-CON M) 10 MEQ tablet [id number] Yes Take 1 tablet (10 mEq total) by mouth daily. Nafziger, Kandee Keen, NP Taking Active   sodium chloride (OCEAN) 0.65 % SOLN nasal spray [id number] Yes Place 1 spray into both nostrils as needed for congestion. [provider] Taking Active Self  UNKNOWN TO PATIENT [id number] Yes Take 0.5-1 ampules by nebulization See admin instructions. Unnamed neb treatment for post-nasal drip and coughing- Nebulize the contents of 0.5-1 ampule and inhale into the lungs once a day as needed for mucus removal or coughing [provider] Taking Active Self            SDOH:  (Social Determinants of Health) assessments and interventions performed: Yes SDOH Interventions    Flowsheet Row Care Coordination from 07/15/2022 in [facility] Clinical Support from 09/13/2021 in [facility] [place] Clinical Support from 09/09/2020 in [facility]  [place]  SDOH Interventions     Food Insecurity Interventions -- Intervention Not Indicated Intervention Not Indicated  Housing Interventions Intervention Not Indicated Intervention Not Indicated Intervention Not Indicated  Transportation Interventions Other (Comment)  [Discussed insurance transport and Pharmacist, community Health] Intervention Not Indicated Intervention Not Indicated  Financial Strain Interventions -- Intervention Not Indicated Intervention Not Indicated  Physical Activity Interventions -- Intervention Not Indicated Intervention Not Indicated  Stress Interventions -- Intervention Not Indicated Intervention Not Indicated  Social Connections Interventions -- Intervention Not Indicated Intervention Not Indicated       Medication Assistance: None required.  Patient affirms current coverage meets needs.  Medication Access: Name and location of current pharmacy:  [id number] [place], [place] - [address] AT [facility] [address] [facility] [address] [place] [address] Phone: [phone] Fax: [phone]  [facility] [address] [place] [address] Phone: [phone] Fax: [phone]  [facility] (Now [facility]) - [address] [place], [place] - [address] [place] [address] Phone: [phone] Fax: [phone]  Within the past 30 days, how often has patient missed a dose of medication? None Is a pillbox or other method used to improve adherence? Yes  Factors that may affect medication adherence? transportation problems Are meds synced by current pharmacy? No  Are meds delivered by current pharmacy? Yes  Does patient experience delays in picking up medications due to transportation concerns?  Uses mail order  Compliance/Adherence/Medication fill history: Care Gaps: AWV - completed 09/13/2021 Last BP - 128/78 on 06/14/2022 Covid - never done Tdap  - never done Shingrix - never done Pneumovax - postponed Mammogram - postponed Colonoscopy - postponed Hep C Screen - postponed  [facility]: Metformin 500 mg - last filled 06/17/2022 90 DS at [facility]    Assessment/Plan Hypertension (BP goal <130/80) -Controlled -Current treatment: Metoprolol tartrate 50mg  1/2  tab BID Appropriate, Effective, Safe, Accessible -Medications previously tried: Benazepril, HCTZ -Current home readings: checks daily and has an app that logs it. Was 120/61 HR 68 -Current dietary habits: mindful of salt intake -Current exercise habits: limited due to wheelchair -Denies hypotensive/hypertensive symptoms -Educated on BP goals and benefits of medications for prevention of heart attack, stroke and kidney damage; Daily salt intake goal < 2300 mg; Importance of home blood pressure monitoring; Proper BP monitoring technique; Symptoms of hypotension and importance of maintaining adequate hydration; -Counseled to monitor BP at home daily, document, and provide log at future appointments -Recommended to continue current medication  Lymphedema (Goal: Reduce swelling and fluid retention) -Controlled -Current treatment  Furosemide 20mg  1 qd Potassium Chloride 10 mEq 1 qd -Medications previously tried: HCTZ  -Checks weight daily and denies any signs of fluid retention -Recommended to continue current medication  Chronic Respiratory Failure (Goal: Improve ability to respirate) -Not assessed today -Current treatment  Ocean saline spray 0.65% prn Appropriate, Effective, Safe, Accessible Mucinex sinus spray prn Appropriate, Effective, Safe, Accessible Duoneb 1 vial q 6 h via neb prn Appropriate, Effective, Safe, Accessible  Query Prediabetes (Goal: A1C <6.5) -Not assessed today -Current treatment  Metformin 500mg  1 BID Appropriate, Effective, Safe, Accessible   Query Anxiety (Goal: Achieve well-controlled mood that still allows for ADLs) -Not assessed  today -Current treatment  Hydroxyzine 25mg  1 qhs prn anxiety Appropriate, Effective, Safe, Accessible  -Using for anxiety associated with CPAP mask/claustrophobia, switching mask to nose only and is considering stopping, will let us know   OTC  -Current treatment  Acetaminophen 500mg  2 tabs q 8hr prn pain Appropriate, Effective, Safe, Accessible Vit D 1000 units 1 qd Appropriate, Effective, Safe, Accessible Vit B12 1 qd Appropriate, Effective, Safe, Accessible    Sherrill Raring Clinical Pharmacist [phone]

## 2022-07-14 ENCOUNTER — Ambulatory Visit: Payer: Medicare HMO | Admitting: Internal Medicine

## 2022-07-15 ENCOUNTER — Ambulatory Visit: Payer: Medicare HMO

## 2022-07-15 ENCOUNTER — Other Ambulatory Visit: Payer: Self-pay | Admitting: Adult Health

## 2022-07-15 DIAGNOSIS — E2839 Other primary ovarian failure: Secondary | ICD-10-CM

## 2022-07-21 ENCOUNTER — Ambulatory Visit: Payer: Medicare HMO | Admitting: Cardiology

## 2022-07-21 ENCOUNTER — Other Ambulatory Visit (HOSPITAL_COMMUNITY): Payer: Self-pay

## 2022-07-26 ENCOUNTER — Encounter (HOSPITAL_BASED_OUTPATIENT_CLINIC_OR_DEPARTMENT_OTHER): Payer: Medicare HMO | Admitting: Internal Medicine

## 2022-09-05 ENCOUNTER — Other Ambulatory Visit: Payer: Self-pay | Admitting: Adult Health

## 2022-09-05 DIAGNOSIS — I10 Essential (primary) hypertension: Secondary | ICD-10-CM

## 2022-09-07 ENCOUNTER — Ambulatory Visit: Payer: Medicare HMO | Admitting: Cardiology

## 2022-09-12 ENCOUNTER — Telehealth: Payer: Self-pay

## 2022-09-12 NOTE — Progress Notes (Signed)
   09/12/2022  Patient ID: Allison Short, female   DOB: 02-18-1951, 72 y.o.   MRN: [id number]  S/O Telephone visit to establish care with [facility] Pharm[facility]  Medication Manamgement -Reviewed active medication list to verify accuracy and adherence -No barriers to adherence (access, affordability, etc.) -Patient was getting meds through [facility] but states she had several instances of not receiving medications and going without BP meds for several days  -Patient contacted insurance, and she was informed that prescriptions can be filled at a retail pharmacy.  She has established care with [facility], and they are able to deliver medications to her home. -Chronic conditions well-managed at this time  A/P  Medication Management -Updated preferred pharmacy from [facility] to [facility] to verify medications to there moving forward -Sees PCP 7/31, so I will mention to make sure any prescription renewals are routed to correct pharmacy -Continue current medication regimen and regular follow-up with providers  Follow-up:  Non scheduled but can as needed per PCP, and patient has my direct number if needs arise  Lenna Gilford, PharmD, DPLA

## 2022-09-15 ENCOUNTER — Telehealth: Payer: Self-pay | Admitting: Adult Health

## 2022-09-15 NOTE — Telephone Encounter (Signed)
Checking on progress of prescription refill request potassium chloride (KLOR-CON M) 10 MEQ tablet furosemide (LASIX) 20 MG tablet

## 2022-09-15 NOTE — Telephone Encounter (Signed)
Pt advised to check the pharmacy bc this was taking care of in April. Pt stated she spoke with the pharmacy after the call and advised that she was able to get Rx. No further action needed.

## 2022-09-16 ENCOUNTER — Ambulatory Visit (INDEPENDENT_AMBULATORY_CARE_PROVIDER_SITE_OTHER): Payer: Medicare HMO

## 2022-09-16 VITALS — Ht 68.0 in | Wt 319.4 lb

## 2022-09-16 DIAGNOSIS — Z0000 Encounter for general adult medical examination without abnormal findings: Secondary | ICD-10-CM | POA: Diagnosis not present

## 2022-09-16 NOTE — Progress Notes (Signed)
Subjective:   Allison Short is a 72 y.o. female who presents for Medicare Annual (Subsequent) preventive examination.  Visit Complete: Virtual  I connected with  Allison Short on 09/16/22 by a audio enabled telemedicine application and verified that I am speaking with the correct person using two identifiers.  Patient Location: Home  Provider Location: Home Office  I discussed the limitations of evaluation and management by telemedicine. The patient expressed understanding and agreed to proceed.  Patient Medicare AWV questionnaire was completed by the patient on 09/16/22; I have confirmed that all information answered by patient is correct and no changes since this date.  Review of Systems    Vital Signs: Unable to obtain new vitals due to this being a telehealth visit.   Cardiac Risk Factors include: advanced age (>38men, >44 women);hypertension     Objective:    Today's Vitals   09/16/22 1241  Weight: (!) 319 lb 6.4 oz (144.9 kg)  Height: 5\' 8"  (1.727 m)   Body mass index is 48.56 kg/m.     09/16/2022   12:54 PM 04/08/2022   11:00 PM 09/13/2021   12:46 PM 11/04/2020    2:00 PM 09/09/2020    1:08 PM 08/20/2020   11:00 PM 08/20/2020    3:48 PM  Advanced Directives  Does Patient Have a Medical Advance Directive? No No No No Yes No No  Type of Agricultural consultant;Living will    Copy of Healthcare Power of Attorney in Chart?     No - copy requested    Would patient like information on creating a medical advance directive? No - Patient declined No - Patient declined No - Patient declined No - Patient declined  No - Patient declined     Current Medications (verified) Outpatient Encounter Medications as of 09/16/2022  Medication Sig   acetaminophen (TYLENOL) 500 MG tablet Take 1,000 mg by mouth every 8 (eight) hours as needed for mild pain, moderate pain or headache.   Cholecalciferol (VITAMIN D3) 25 MCG (1000 UT) capsule Take 1,000 Units by mouth  daily.   cyanocobalamin (VITAMIN B12) 1000 MCG tablet Take 1 tablet (1,000 mcg total) by mouth daily.   furosemide (LASIX) 20 MG tablet Take 1 tablet (20 mg total) by mouth daily.   hydrOXYzine (ATARAX) 25 MG tablet Take 1 tablet (25 mg total) by mouth at bedtime as needed for anxiety. (Patient not taking: Reported on 09/16/2022)   ipratropium-albuterol (DUONEB) 0.5-2.5 (3) MG/3ML SOLN Take 3 mLs by nebulization every 6 (six) hours as needed. (Patient not taking: Reported on 09/16/2022)   metFORMIN (GLUCOPHAGE) 500 MG tablet TAKE 1 TABLET TWICE DAILY WITH MEALS   metoprolol tartrate (LOPRESSOR) 50 MG tablet TAKE 1 TABLET TWICE DAILY   MUCINEX SINUS-MAX CLEAR & COOL 0.05 % nasal spray Place 1 spray into both nostrils 2 (two) times daily as needed for congestion.   potassium chloride (KLOR-CON M) 10 MEQ tablet Take 1 tablet (10 mEq total) by mouth daily.   sodium chloride (OCEAN) 0.65 % SOLN nasal spray Place 1 spray into both nostrils as needed for congestion.   UNKNOWN TO PATIENT Take 0.5-1 ampules by nebulization See admin instructions. Unnamed neb treatment for post-nasal drip and coughing- Nebulize the contents of 0.5-1 ampule and inhale into the lungs once a day as needed for mucus removal or coughing   No facility-administered encounter medications on file as of 09/16/2022.    Allergies (verified) Ciprofloxin hcl [ciprofloxacin], Morphine and  codeine, Asa [aspirin], Codeine, Chia oil, Erythromycin, Nsaids, and Penicillins   History: Past Medical History:  Diagnosis Date   Claustrophobia    History of blood transfusion    2016   History of urinary tract infection    Hypertension    Non Hodgkin's lymphoma (HCC) 2008   Obesity    Osteopenia 04/2017   T score -1.4 FRAX 7.5% / 0.8%   Pancreatitis    Past Surgical History:  Procedure Laterality Date   BIOPSY THYROID     BONE MARROW BIOPSY     CESAREAN SECTION  10/26/1975   CHOLECYSTECTOMY     2006   PORTA CATH INSERTION      TONSILLECTOMY AND ADENOIDECTOMY     1952   TOTAL KNEE ARTHROPLASTY Bilateral    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     Family History  Problem Relation Age of Onset   Arthritis Maternal Grandmother    Diabetes Son    Social History   Socioeconomic History   Marital status: Divorced    Spouse name: Not on file   Number of children: Not on file   Years of education: Not on file   Highest education level: Some college, no degree  Occupational History   Not on file  Tobacco Use   Smoking status: Never   Smokeless tobacco: Never  Vaping Use   Vaping status: Never Used  Substance and Sexual Activity   Alcohol use: Yes    Alcohol/week: 0.0 standard drinks of alcohol    Comment: Occas   Drug use: No   Sexual activity: Not Currently    Comment: 1st intercourse 15 yo-5 partners  Other Topics Concern   Not on file  Social History Narrative   Not on file   Social Determinants of Health   Financial Resource Strain: Low Risk  (09/16/2022)   Overall Financial Resource Strain (CARDIA)    Difficulty of Paying Living Expenses: Not hard at all  Food Insecurity: No Food Insecurity (09/16/2022)   Hunger Vital Sign    Worried About Running Out of Food in the Last Year: Never true    Ran Out of Food in the Last Year: Never true  Transportation Needs: No Transportation Needs (09/16/2022)   PRAPARE - Administrator, Civil Service (Medical): No    Lack of Transportation (Non-Medical): No  Recent Concern: Transportation Needs - Unmet Transportation Needs (07/15/2022)   PRAPARE - Transportation    Lack of Transportation (Medical): Yes    Lack of Transportation (Non-Medical): Yes  Physical Activity: Inactive (09/16/2022)   Exercise Vital Sign    Days of Exercise per Week: 0 days    Minutes of Exercise per Session: 0 min  Stress: No Stress Concern Present (09/16/2022)   [employer] of Occupational Health - Occupational Stress Questionnaire    Feeling of Stress : Not at all  Social Connections:  Moderately Integrated (09/16/2022)   Social Connection and Isolation Panel [NHANES]    Frequency of Communication with Friends and Family: More than three times a week    Frequency of Social Gatherings with Friends and Family: More than three times a week    Attends Religious Services: More than 4 times per year    Active Member of [employer] or Organizations: Yes    Attends Engineer, structural: More than 4 times per year    Marital Status: Divorced    Tobacco Counseling Counseling given: Not Answered   Clinical Intake:  Pre-visit preparation completed: Yes  Pain : No/denies pain     BMI - recorded: 48.56 Nutritional Status: BMI > 30  Obese Nutritional Risks: None Diabetes: No  How often do you need to have someone help you when you read instructions, pamphlets, or other written materials from your doctor or pharmacy?: 1 - Never  Interpreter Needed?: No  Information entered by :: Theresa Mulligan LPN   Activities of Daily Living    09/16/2022   12:53 PM 09/15/2022    2:41 PM  In your present state of health, do you have any difficulty performing the following activities:  Hearing? 0 0  Vision? 0 0  Difficulty concentrating or making decisions? 0 0  Walking or climbing stairs? 0 1  Dressing or bathing? 0 0  Doing errands, shopping? 0 1  Preparing Food and eating ? N N  Using the Toilet? N N  In the past six months, have you accidently leaked urine? N Y  Do you have problems with loss of bowel control? N N  Managing your Medications? N N  Managing your Finances? N N  Housekeeping or managing your Housekeeping? N Y    Patient Care Team: Shirline Frees, NP as PCP - General (Family Medicine) Fontaine, Nadyne Coombes, MD (Inactive) as Consulting Physician (Gynecology) [place], Milas Kocher, [facility] (Inactive) (Pharmacist)  Indicate any recent Medical Services you may have received from other than [facility] providers in the past year (date may be approximate).     Assessment:    This is a routine wellness examination for [facility].  Hearing/Vision screen Hearing Screening - Comments:: Denies hearing difficulties   Vision Screening - Comments:: Wears rx glasses - up to date with routine eye exams with  [facility]  Dietary issues and exercise activities discussed:     Goals Addressed               This Visit's Progress     Practice walking better (pt-stated)         Depression Screen    09/16/2022   12:52 PM 09/13/2021   12:39 PM 09/09/2020    1:10 PM 09/09/2020    1:05 PM 07/08/2015   11:59 AM  PHQ 2/9 Scores  PHQ - 2 Score 0 0 0 0 0    Fall Risk    09/16/2022   12:53 PM 09/15/2022    2:41 PM 09/13/2021   12:46 PM 09/09/2020    1:09 PM 07/08/2015   11:59 AM  Fall Risk   Falls in the past year? 0 0 0 0 No  Number falls in past yr: 0  0 0   Injury with Fall? 0  0 0   Risk for fall due to : No Fall Risks  No Fall Risks Orthopedic patient   Follow up Falls prevention discussed   Falls evaluation completed;Education provided     MEDICARE RISK AT HOME:  Medicare Risk at Home - 09/16/22 1301     Any stairs in or around the home? No    If so, are there any without handrails? No    Home free of loose throw rugs in walkways, pet beds, electrical cords, etc? Yes    Adequate lighting in your home to reduce risk of falls? Yes    Life alert? No    Use of a cane, walker or w/c? Yes    Grab bars in the bathroom? Yes    Shower chair or bench in shower? Yes    Elevated toilet seat or a  handicapped toilet? Yes             TIMED UP AND GO:  Was the test performed?  No    Cognitive Function:        09/16/2022   12:54 PM 09/13/2021   12:47 PM  6CIT Screen  What Year? 0 points 0 points  What month? 0 points 0 points  What time? 0 points 0 points  Count back from 20 0 points 0 points  Months in reverse 0 points 0 points  Repeat phrase 0 points 0 points  Total Score 0 points 0 points    Immunizations  There is no immunization history on  file for this patient.  TDAP status: Due, Education has been provided regarding the importance of this vaccine. Advised may receive this vaccine at local pharmacy or Health Dept. Aware to provide a copy of the vaccination record if obtained from local pharmacy or Health Dept. Verbalized acceptance and understanding.  Flu Vaccine status: Up to date  Pneumococcal vaccine status: Declined,  Education has been provided regarding the importance of this vaccine but patient still declined. Advised may receive this vaccine at local pharmacy or Health Dept. Aware to provide a copy of the vaccination record if obtained from local pharmacy or Health Dept. Verbalized acceptance and understanding.   Covid-19 vaccine status: Completed vaccines  Qualifies for Shingles Vaccine? Yes   Zostavax completed No   Shingrix Completed?: No.    Education has been provided regarding the importance of this vaccine. Patient has been advised to call insurance company to determine out of pocket expense if they have not yet received this vaccine. Advised may also receive vaccine at local pharmacy or Health Dept. Verbalized acceptance and understanding.  Screening Tests Health Maintenance  Topic Date Due   DTaP/Tdap/Td (1 - Tdap) Never done   MAMMOGRAM  04/20/2018   COVID-19 Vaccine (1) 10/02/2022 (Originally 07/23/1955)   Zoster Vaccines- Shingrix (1 of 2) 12/17/2022 (Originally 07/24/1969)   Pneumonia Vaccine 55+ Years old (1 of 2 - PCV) 09/16/2023 (Originally 07/24/1956)   Colonoscopy  09/16/2023 (Originally 03/25/2019)   Hepatitis C Screening  09/16/2023 (Originally 07/24/1968)   INFLUENZA VACCINE  09/22/2022   Medicare Annual Wellness (AWV)  09/16/2023   DEXA SCAN  Completed   HPV VACCINES  Aged Out    Health Maintenance  Health Maintenance Due  Topic Date Due   DTaP/Tdap/Td (1 - Tdap) Never done   MAMMOGRAM  04/20/2018    Colorectal cancer screening: Referral to GI placed Patient declined. Pt aware the office will  call re: appt.  Mammogram status: Ordered 07/15/22. Pt provided with contact info and advised to call to schedule appt.   Bone Density status: Ordered 07/15/22. Pt provided with contact info and advised to call to schedule appt.  Lung Cancer Screening: (Low Dose CT Chest recommended if Age 13-80 years, 20 pack-year currently smoking OR have quit w/in 15years.) does not qualify.     Additional Screening:  Hepatitis C Screening: does qualify; Deferred  Vision Screening: Recommended annual ophthalmology exams for early detection of glaucoma and other disorders of the eye. Is the patient up to date with their annual eye exam?  Yes  Who is the provider or what is the name of the office in which the patient attends annual eye exams? [facility] If pt is not established with a provider, would they like to be referred to a provider to establish care? No .   Dental Screening: Recommended annual  dental exams for proper oral hygiene   Community Resource Referral / Chronic Care Management:  CRR required this visit?  No   CCM required this visit?  No     Plan:     I have personally reviewed and noted the following in the patient's chart:   Medical and social history Use of alcohol, tobacco or illicit drugs  Current medications and supplements including opioid prescriptions. Patient is not currently taking opioid prescriptions. Functional ability and status Nutritional status Physical activity Advanced directives List of other physicians Hospitalizations, surgeries, and ER visits in previous 12 months Vitals Screenings to include cognitive, depression, and falls Referrals and appointments  In addition, I have reviewed and discussed with patient certain preventive protocols, quality metrics, and best practice recommendations. A written personalized care plan for preventive services as well as general preventive health recommendations were provided to patient.     Tillie Rung, LPN   1/61/0960   After Visit Summary: (MyChart) Due to this being a telephonic visit, the after visit summary with patients personalized plan was offered to patient via MyChart   Nurse Notes: Patient due Hep-C Screening

## 2022-09-16 NOTE — Patient Instructions (Addendum)
Allison Short , Thank you for taking time to come for your Medicare Wellness Visit. I appreciate your ongoing commitment to your health goals. Please review the following plan we discussed and let me know if I can assist you in the future.   Referrals/Orders/Follow-Ups/Clinician Recommendations:   This is a list of the screening recommended for you and due dates:  Health Maintenance  Topic Date Due   DTaP/Tdap/Td vaccine (1 - Tdap) Never done   Mammogram  04/20/2018   COVID-19 Vaccine (1) 10/02/2022*   Zoster (Shingles) Vaccine (1 of 2) 12/17/2022*   Pneumonia Vaccine (1 of 2 - PCV) 09/16/2023*   Colon Cancer Screening  09/16/2023*   Hepatitis C Screening  09/16/2023*   Flu Shot  09/22/2022   Medicare Annual Wellness Visit  09/16/2023   DEXA scan (bone density measurement)  Completed   HPV Vaccine  Aged Out  *Topic was postponed. The date shown is not the original due date.    Advanced directives: (Declined) Advance directive discussed with you today. Even though you declined this today, please call our office should you change your mind, and we can give you the proper paperwork for you to fill out.  Next Medicare Annual Wellness Visit scheduled for next year: Yes  Preventive Care 4 Years and Older, Female Preventive care refers to lifestyle choices and visits with your health care provider that can promote health and wellness. What does preventive care include? A yearly physical exam. This is also called an annual well check. Dental exams once or twice a year. Routine eye exams. Ask your health care provider how often you should have your eyes checked. Personal lifestyle choices, including: Daily care of your teeth and gums. Regular physical activity. Eating a healthy diet. Avoiding tobacco and drug use. Limiting alcohol use. Practicing safe sex. Taking low-dose aspirin every day. Taking vitamin and mineral supplements as recommended by your health care provider. What happens  during an annual well check? The services and screenings done by your health care provider during your annual well check will depend on your age, overall health, lifestyle risk factors, and family history of disease. Counseling  Your health care provider may ask you questions about your: Alcohol use. Tobacco use. Drug use. Emotional well-being. Home and relationship well-being. Sexual activity. Eating habits. History of falls. Memory and ability to understand (cognition). Work and work Astronomer. Reproductive health. Screening  You may have the following tests or measurements: Height, weight, and BMI. Blood pressure. Lipid and cholesterol levels. These may be checked every 5 years, or more frequently if you are over 19 years old. Skin check. Lung cancer screening. You may have this screening every year starting at age 34 if you have a 30-pack-year history of smoking and currently smoke or have quit within the past 15 years. Fecal occult blood test (FOBT) of the stool. You may have this test every year starting at age 31. Flexible sigmoidoscopy or colonoscopy. You may have a sigmoidoscopy every 5 years or a colonoscopy every 10 years starting at age 27. Hepatitis C blood test. Hepatitis B blood test. Sexually transmitted disease (STD) testing. Diabetes screening. This is done by checking your blood sugar (glucose) after you have not eaten for a while (fasting). You may have this done every 1-3 years. Bone density scan. This is done to screen for osteoporosis. You may have this done starting at age 50. Mammogram. This may be done every 1-2 years. Talk to your health care provider about how often  you should have regular mammograms. Talk with your health care provider about your test results, treatment options, and if necessary, the need for more tests. Vaccines  Your health care provider may recommend certain vaccines, such as: Influenza vaccine. This is recommended every  year. Tetanus, diphtheria, and acellular pertussis (Tdap, Td) vaccine. You may need a Td booster every 10 years. Zoster vaccine. You may need this after age 60. Pneumococcal 13-valent conjugate (PCV13) vaccine. One dose is recommended after age 9. Pneumococcal polysaccharide (PPSV23) vaccine. One dose is recommended after age 85. Talk to your health care provider about which screenings and vaccines you need and how often you need them. This information is not intended to replace advice given to you by your health care provider. Make sure you discuss any questions you have with your health care provider. Document Released: 03/06/2015 Document Revised: 10/28/2015 Document Reviewed: 12/09/2014 Elsevier Interactive Patient Education  2017 [employer].  Fall Prevention in the Home Falls can cause injuries. They can happen to people of all ages. There are many things you can do to make your home safe and to help prevent falls. What can I do on the outside of my home? Regularly fix the edges of walkways and driveways and fix any cracks. Remove anything that might make you trip as you walk through a door, such as a raised step or threshold. Trim any bushes or trees on the path to your home. Use bright outdoor lighting. Clear any walking paths of anything that might make someone trip, such as rocks or tools. Regularly check to see if handrails are loose or broken. Make sure that both sides of any steps have handrails. Any raised decks and porches should have guardrails on the edges. Have any leaves, snow, or ice cleared regularly. Use sand or salt on walking paths during winter. Clean up any spills in your garage right away. This includes oil or grease spills. What can I do in the bathroom? Use night lights. Install grab bars by the toilet and in the tub and shower. Do not use towel bars as grab bars. Use non-skid mats or decals in the tub or shower. If you need to sit down in the shower, use a  plastic, non-slip stool. Keep the floor dry. Clean up any water that spills on the floor as soon as it happens. Remove soap buildup in the tub or shower regularly. Attach bath mats securely with double-sided non-slip rug tape. Do not have throw rugs and other things on the floor that can make you trip. What can I do in the bedroom? Use night lights. Make sure that you have a light by your bed that is easy to reach. Do not use any sheets or blankets that are too big for your bed. They should not hang down onto the floor. Have a firm chair that has side arms. You can use this for support while you get dressed. Do not have throw rugs and other things on the floor that can make you trip. What can I do in the kitchen? Clean up any spills right away. Avoid walking on wet floors. Keep items that you use a lot in easy-to-reach places. If you need to reach something above you, use a strong step stool that has a grab bar. Keep electrical cords out of the way. Do not use floor polish or wax that makes floors slippery. If you must use wax, use non-skid floor wax. Do not have throw rugs and other things on  the floor that can make you trip. What can I do with my stairs? Do not leave any items on the stairs. Make sure that there are handrails on both sides of the stairs and use them. Fix handrails that are broken or loose. Make sure that handrails are as long as the stairways. Check any carpeting to make sure that it is firmly attached to the stairs. Fix any carpet that is loose or worn. Avoid having throw rugs at the top or bottom of the stairs. If you do have throw rugs, attach them to the floor with carpet tape. Make sure that you have a light switch at the top of the stairs and the bottom of the stairs. If you do not have them, ask someone to add them for you. What else can I do to help prevent falls? Wear shoes that: Do not have high heels. Have rubber bottoms. Are comfortable and fit you  well. Are closed at the toe. Do not wear sandals. If you use a stepladder: Make sure that it is fully opened. Do not climb a closed stepladder. Make sure that both sides of the stepladder are locked into place. Ask someone to hold it for you, if possible. Clearly mark and make sure that you can see: Any grab bars or handrails. First and last steps. Where the edge of each step is. Use tools that help you move around (mobility aids) if they are needed. These include: Canes. Walkers. Scooters. Crutches. Turn on the lights when you go into a dark area. Replace any light bulbs as soon as they burn out. Set up your furniture so you have a clear path. Avoid moving your furniture around. If any of your floors are uneven, fix them. If there are any pets around you, be aware of where they are. Review your medicines with your doctor. Some medicines can make you feel dizzy. This can increase your chance of falling. Ask your doctor what other things that you can do to help prevent falls. This information is not intended to replace advice given to you by your health care provider. Make sure you discuss any questions you have with your health care provider. Document Released: 12/04/2008 Document Revised: 07/16/2015 Document Reviewed: 03/14/2014 Elsevier Interactive Patient Education  2017 [employer].

## 2022-09-20 ENCOUNTER — Encounter: Payer: Self-pay | Admitting: Pharmacist

## 2022-09-20 DIAGNOSIS — R7303 Prediabetes: Secondary | ICD-10-CM

## 2022-09-20 NOTE — Progress Notes (Signed)
[facility]) [facility] Team Statin Quality Measure Assessment  09/20/2022  Pocahontas Crus 02/04/1951 [id number]  Per review of chart and payor information, patient is getting a medication filled used to treat diabetes but is not currently filling a statin prescription.  This places patient into the Statin Use In Patients with Diabetes (SUPD) measure for CMS.    Per chart review, patient is not on a statin and does not have documented statin intolerance.  She has an upcoming appointment on 09/21/2022.  If deemed therapeutically appropriate, statin assessment could be completed during the visit.  From the last HgA1c, patient may have Prediabetes which is an exclusion.  If patient has experienced statin intolerance or can be excluded from statin therapy with Prediabetes (or one of the other exclusions listed below), an exclusion code could be associated with the upcoming visit.  Associating a SUPD statin exclusion code would remove the patient from the measure.   The ASCVD Risk score (Arnett DK, et al., 2019) failed to calculate for the following reasons:   The systolic blood pressure is missing   Cannot find a previous HDL lab   Cannot find a previous total cholesterol lab No results found for requested labs within last 1095 days.     Component Value Date/Time   CHOL 141 11/20/2018 0833   TRIG 81.0 11/20/2018 0833   HDL 42.60 11/20/2018 0833   CHOLHDL 3 11/20/2018 0833   VLDL 16.2 11/20/2018 0833   LDLCALC 82 11/20/2018 0833    Please consider ONE of the following recommendations:  Initiate high intensity statin Atorvastatin 40 mg once daily, #90, 3 refills   Rosuvastatin 20 mg once daily, #90, 3 refills    Initiate moderate intensity          statin with reduced frequency if prior          statin intolerance 1x weekly, #13, 3 refills   2x weekly, #26, 3 refills   3x weekly, #39, 3 refills    Code for past statin intolerance or  other exclusions (required  annually)  Provider Requirements: Associate code during an office visit or telehealth encounter  Drug Induced Myopathy G72.0   Myopathy, unspecified G72.9   Myositis, unspecified M60.9   Rhabdomyolysis M62.82   Cirrhosis of liver K74.69   Prediabetes R73.03   PCOS E28.2    Plan: Route note to Patient's Provider prior to the upcoming appointment.   Beecher Mcardle, PharmD, BCACP [facility] Clinical Pharmacist [phone]

## 2022-09-20 NOTE — Progress Notes (Unsigned)
error 

## 2022-09-21 ENCOUNTER — Ambulatory Visit (INDEPENDENT_AMBULATORY_CARE_PROVIDER_SITE_OTHER): Payer: Medicare HMO | Admitting: Adult Health

## 2022-09-21 ENCOUNTER — Encounter: Payer: Self-pay | Admitting: Adult Health

## 2022-09-21 ENCOUNTER — Other Ambulatory Visit: Payer: Self-pay | Admitting: Adult Health

## 2022-09-21 VITALS — BP 144/82 | HR 80 | Temp 98.2°F | Ht 68.0 in | Wt 317.0 lb

## 2022-09-21 DIAGNOSIS — R6889 Other general symptoms and signs: Secondary | ICD-10-CM | POA: Diagnosis not present

## 2022-09-21 DIAGNOSIS — I10 Essential (primary) hypertension: Secondary | ICD-10-CM | POA: Diagnosis not present

## 2022-09-21 DIAGNOSIS — R7303 Prediabetes: Secondary | ICD-10-CM

## 2022-09-21 DIAGNOSIS — Z1231 Encounter for screening mammogram for malignant neoplasm of breast: Secondary | ICD-10-CM

## 2022-09-21 DIAGNOSIS — E2839 Other primary ovarian failure: Secondary | ICD-10-CM

## 2022-09-21 LAB — POCT GLYCOSYLATED HEMOGLOBIN (HGB A1C): Hemoglobin A1C: 5.8 % — AB (ref 4.0–5.6)

## 2022-09-21 NOTE — Progress Notes (Signed)
Subjective:    Patient ID: Allison Short, female    DOB: Mar 29, 1950, 72 y.o.   MRN: [id number]  HPI  73 year old female who  has a past medical history of Claustrophobia, History of blood transfusion, History of urinary tract infection, Hypertension, Non Hodgkin's lymphoma (HCC) (2008), Obesity, Osteopenia (04/2017), and Pancreatitis.  She presents to the office today for three month follow up regarding Prediabetes and HTN   Prediabetes -last A1c was 6.4- 3 months ago.  We placed her on metformin 500 mg twice daily.  She does report that she has been tolerating this medication well  Lab Results  Component Value Date   HGBA1C 5.8 (A) 09/21/2022   HTN - currently managed with Metoprolol 25mg  BID and lasix 20 mg daily. She has been checking her blood pressure at home with readings in the 120-170 with a vast majority of her blood pressure readings in the 130-140 systolic.   BP Readings from Last 3 Encounters:  09/21/22 (!) 144/82  06/14/22 128/78  05/10/22 138/88   Obesity - she has been able to lose a total of 41.9 pounds. She is actively working on calorie counting and following a heathy diet. She is also walking around more often.  Wt Readings from Last 3 Encounters:  09/21/22 (!) 317 lb (143.8 kg)  09/16/22 (!) 319 lb 6.4 oz (144.9 kg)  05/10/22 (!) 343 lb (155.6 kg)   Review of Systems See HPI   Past Medical History:  Diagnosis Date   Claustrophobia    History of blood transfusion    2016   History of urinary tract infection    Hypertension    Non Hodgkin's lymphoma (HCC) 2008   Obesity    Osteopenia 04/2017   T score -1.4 FRAX 7.5% / 0.8%   Pancreatitis     Social History   Socioeconomic History   Marital status: Divorced    Spouse name: Not on file   Number of children: Not on file   Years of education: Not on file   Highest education level: Some college, no degree  Occupational History   Not on file  Tobacco Use   Smoking status: Never   Smokeless tobacco:  Never  Vaping Use   Vaping status: Never Used  Substance and Sexual Activity   Alcohol use: Yes    Alcohol/week: 0.0 standard drinks of alcohol    Comment: Occas   Drug use: No   Sexual activity: Not Currently    Comment: 1st intercourse 15 yo-5 partners  Other Topics Concern   Not on file  Social History Narrative   Not on file   Social Determinants of Health   Financial Resource Strain: Low Risk  (09/16/2022)   Overall Financial Resource Strain (CARDIA)    Difficulty of Paying Living Expenses: Not hard at all  Food Insecurity: No Food Insecurity (09/16/2022)   Hunger Vital Sign    Worried About Running Out of Food in the Last Year: Never true    Ran Out of Food in the Last Year: Never true  Transportation Needs: No Transportation Needs (09/16/2022)   PRAPARE - Administrator, Civil Service (Medical): No    Lack of Transportation (Non-Medical): No  Recent Concern: Transportation Needs - Unmet Transportation Needs (07/15/2022)   PRAPARE - Transportation    Lack of Transportation (Medical): Yes    Lack of Transportation (Non-Medical): Yes  Physical Activity: Inactive (09/16/2022)   Exercise Vital Sign    Days of Exercise  per Week: 0 days    Minutes of Exercise per Session: 0 min  Stress: No Stress Concern Present (09/16/2022)   [employer] of Occupational Health - Occupational Stress Questionnaire    Feeling of Stress : Not at all  Social Connections: Moderately Integrated (09/16/2022)   Social Connection and Isolation Panel [NHANES]    Frequency of Communication with Friends and Family: More than three times a week    Frequency of Social Gatherings with Friends and Family: More than three times a week    Attends Religious Services: More than 4 times per year    Active Member of [employer] or Organizations: Yes    Attends Engineer, structural: More than 4 times per year    Marital Status: Divorced  Intimate Partner Violence: Not At Risk (09/16/2022)    Humiliation, Afraid, Rape, and Kick questionnaire    Fear of Current or Ex-Partner: No    Emotionally Abused: No    Physically Abused: No    Sexually Abused: No    Past Surgical History:  Procedure Laterality Date   BIOPSY THYROID     BONE MARROW BIOPSY     CESAREAN SECTION  10/26/1975   CHOLECYSTECTOMY     2006   PORTA CATH INSERTION     TONSILLECTOMY AND ADENOIDECTOMY     1952   TOTAL KNEE ARTHROPLASTY Bilateral    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      Family History  Problem Relation Age of Onset   Arthritis Maternal Grandmother    Diabetes Son     Allergies  Allergen Reactions   Ciprofloxin Hcl [Ciprofloxacin] Other (See Comments)    Unstable gait   Morphine And Codeine Anaphylaxis and Other (See Comments)    Tolerates hydrocodone   Asa [Aspirin] Hives and Other (See Comments)    Hives can become SEVERE, requiring intervention   Codeine Hives   Chia Oil Swelling and Other (See Comments)    Possibly "chia tea" = Face and lips became swollen   Erythromycin Other (See Comments)    Reaction not recalled   Nsaids Other (See Comments)    Lowers GFR   Penicillins Other (See Comments)    Reaction not recalled- was told to "never take this again" by family    Current Outpatient Medications on File Prior to Visit  Medication Sig Dispense Refill   acetaminophen (TYLENOL) 500 MG tablet Take 1,000 mg by mouth every 8 (eight) hours as needed for mild pain, moderate pain or headache.     Cholecalciferol (VITAMIN D3) 25 MCG (1000 UT) capsule Take 1,000 Units by mouth daily.     cyanocobalamin (VITAMIN B12) 1000 MCG tablet Take 1 tablet (1,000 mcg total) by mouth daily.     furosemide (LASIX) 20 MG tablet Take 1 tablet (20 mg total) by mouth daily. 90 tablet 1   hydrOXYzine (ATARAX) 25 MG tablet Take 1 tablet (25 mg total) by mouth at bedtime as needed for anxiety. 90 tablet 1   ipratropium-albuterol (DUONEB) 0.5-2.5 (3) MG/3ML SOLN Take 3 mLs by nebulization every 6 (six) hours as needed. 1080  mL 1   metFORMIN (GLUCOPHAGE) 500 MG tablet TAKE 1 TABLET TWICE DAILY WITH MEALS 180 tablet 3   metoprolol tartrate (LOPRESSOR) 50 MG tablet TAKE 1 TABLET TWICE DAILY 180 tablet 3   MUCINEX SINUS-MAX CLEAR & COOL 0.05 % nasal spray Place 1 spray into both nostrils 2 (two) times daily as needed for congestion.     potassium chloride (KLOR-CON M) 10  MEQ tablet Take 1 tablet (10 mEq total) by mouth daily. 90 tablet 1   sodium chloride (OCEAN) 0.65 % SOLN nasal spray Place 1 spray into both nostrils as needed for congestion.     UNKNOWN TO PATIENT Take 0.5-1 ampules by nebulization See admin instructions. Unnamed neb treatment for post-nasal drip and coughing- Nebulize the contents of 0.5-1 ampule and inhale into the lungs once a day as needed for mucus removal or coughing     No current facility-administered medications on file prior to visit.    BP (!) 144/82   Pulse 80   Temp 98.2 F (36.8 C) (Oral)   Ht 5\' 8"  (1.727 m)   Wt (!) 317 lb (143.8 kg)   BMI 48.20 kg/m       Objective:   Physical Exam Vitals and nursing note reviewed.  Constitutional:      Appearance: Normal appearance. She is obese.  Cardiovascular:     Rate and Rhythm: Normal rate and regular rhythm.     Pulses: Normal pulses.     Heart sounds: Normal heart sounds.  Pulmonary:     Effort: Pulmonary effort is normal.     Breath sounds: Normal breath sounds.  Skin:    General: Skin is warm and dry.  Neurological:     General: No focal deficit present.     Mental Status: She is alert and oriented to person, place, and time.  Psychiatric:        Mood and Affect: Mood normal.        Behavior: Behavior normal.        Thought Content: Thought content normal.        Judgment: Judgment normal.       Assessment & Plan:  1. Essential hypertension - Elevated slightly in the office.  - Will increase metoprolol to 50 mg BID - She will sent me a message if her BP does not even out  2. Morbid obesity (HCC) - Doing  amazing.  - Continue to work on diet and exercise.   3. Prediabetes  - POC HgB A1c- 5.8  - Has improved!  - Continue with metformin 500 mg BID   Shirline Frees, NP

## 2022-09-21 NOTE — Patient Instructions (Addendum)
You are doing amazing!   Your A1c dropped to 5.8!   Keep up the hard work!   I will see you back in 3 months for your annual exam

## 2022-10-15 ENCOUNTER — Encounter: Payer: Self-pay | Admitting: Adult Health

## 2022-10-18 NOTE — Telephone Encounter (Signed)
FYI

## 2022-10-30 NOTE — Progress Notes (Deleted)
Cardiology Office Note:    Date:  10/30/2022   ID:  Allison Short, DOB 04-05-1950, MRN [id number]  PCP:  Shirline Frees, NP  Cardiologist:  None  Electrophysiologist:  None   Referring MD: Shirline Frees, NP   No chief complaint on file. ***  History of Present Illness:    Allison Short is a 72 y.o. female with a hx of morbid obesity, lymphedema, hypertension, non-Hodgkin's lymphoma who is referred by Shirline Frees, NP for evaluation of diastolic heart failure.  She was admitted 04/2022 with lower extremity cellulitis in setting of chronic lymphedema and also had acute on chronic diastolic heart failure.  She was diuresed over 22 L on admission and discharged on p.o. Lasix.  Echo 04/28/2022 showed EF 55 to 60%, normal diastolic function, normal RV function, mild biatrial enlargement.  Past Medical History:  Diagnosis Date   Claustrophobia    History of blood transfusion    2016   History of urinary tract infection    Hypertension    Non Hodgkin's lymphoma (HCC) 2008   Obesity    Osteopenia 04/2017   T score -1.4 FRAX 7.5% / 0.8%   Pancreatitis     Past Surgical History:  Procedure Laterality Date   BIOPSY THYROID     BONE MARROW BIOPSY     CESAREAN SECTION  10/26/1975   CHOLECYSTECTOMY     2006   PORTA CATH INSERTION     TONSILLECTOMY AND ADENOIDECTOMY     1952   TOTAL KNEE ARTHROPLASTY Bilateral    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      Current Medications: No outpatient medications have been marked as taking for the 10/31/22 encounter (Appointment) with Little Ishikawa, MD.     Allergies:   Ciprofloxin hcl [ciprofloxacin], Morphine and codeine, Asa [aspirin], Codeine, Chia oil, Erythromycin, Nsaids, and Penicillins   Social History   Socioeconomic History   Marital status: Divorced    Spouse name: Not on file   Number of children: Not on file   Years of education: Not on file   Highest education level: Some college, no degree  Occupational History   Not on file  Tobacco Use    Smoking status: Never   Smokeless tobacco: Never  Vaping Use   Vaping status: Never Used  Substance and Sexual Activity   Alcohol use: Yes    Alcohol/week: 0.0 standard drinks of alcohol    Comment: Occas   Drug use: No   Sexual activity: Not Currently    Comment: 1st intercourse 15 yo-5 partners  Other Topics Concern   Not on file  Social History Narrative   Not on file   Social Determinants of Health   Financial Resource Strain: Low Risk  (09/16/2022)   Overall Financial Resource Strain (CARDIA)    Difficulty of Paying Living Expenses: Not hard at all  Food Insecurity: No Food Insecurity (09/16/2022)   Hunger Vital Sign    Worried About Running Out of Food in the Last Year: Never true    Ran Out of Food in the Last Year: Never true  Transportation Needs: No Transportation Needs (09/16/2022)   PRAPARE - Administrator, Civil Service (Medical): No    Lack of Transportation (Non-Medical): No  Recent Concern: Transportation Needs - Unmet Transportation Needs (07/15/2022)   PRAPARE - Transportation    Lack of Transportation (Medical): Yes    Lack of Transportation (Non-Medical): Yes  Physical Activity: Inactive (09/16/2022)   Exercise Vital Sign    Days  of Exercise per Week: 0 days    Minutes of Exercise per Session: 0 min  Stress: No Stress Concern Present (09/16/2022)   [employer] of Occupational Health - Occupational Stress Questionnaire    Feeling of Stress : Not at all  Social Connections: Moderately Integrated (09/16/2022)   Social Connection and Isolation Panel [NHANES]    Frequency of Communication with Friends and Family: More than three times a week    Frequency of Social Gatherings with Friends and Family: More than three times a week    Attends Religious Services: More than 4 times per year    Active Member of [employer] or Organizations: Yes    Attends Engineer, structural: More than 4 times per year    Marital Status: Divorced     Family  History: The patient's ***family history includes Arthritis in her maternal grandmother; Diabetes in her son.  ROS:   Please see the history of present illness.    *** All other systems reviewed and are negative.  EKGs/Labs/Other Studies Reviewed:    The following studies were reviewed today: ***  EKG:  EKG is *** ordered today.  The ekg ordered today demonstrates ***  Recent Labs: 04/08/2022: TSH 1.196 04/16/2022: ALT 19 04/23/2022: Magnesium 1.8 06/14/2022: BUN 20; Creatinine, Ser 0.97; Hemoglobin 13.2; Platelets 286.0; Potassium 3.6; Sodium 140  Recent Lipid Panel    Component Value Date/Time   CHOL 141 11/20/2018 0833   TRIG 81.0 11/20/2018 0833   HDL 42.60 11/20/2018 0833   CHOLHDL 3 11/20/2018 0833   VLDL 16.2 11/20/2018 0833   LDLCALC 82 11/20/2018 0833    Physical Exam:    VS:  There were no vitals taken for this visit.    Wt Readings from Last 3 Encounters:  09/21/22 (!) 317 lb (143.8 kg)  09/16/22 (!) 319 lb 6.4 oz (144.9 kg)  05/10/22 (!) 343 lb (155.6 kg)     GEN: *** Well nourished, well developed in no acute distress HEENT: Normal NECK: No JVD; No carotid bruits LYMPHATICS: No lymphadenopathy CARDIAC: ***RRR, no murmurs, rubs, gallops RESPIRATORY:  Clear to auscultation without rales, wheezing or rhonchi  ABDOMEN: Soft, non-tender, non-distended MUSCULOSKELETAL:  No edema; No deformity  SKIN: Warm and dry NEUROLOGIC:  Alert and oriented x 3 PSYCHIATRIC:  Normal affect   ASSESSMENT:    No diagnosis found. PLAN:    Chronic diastolic heart failure: Echo 04/28/2022 showed EF 55 to 60%, normal diastolic function, normal RV function, mild biatrial enlargement.  On Lasix 20 mg daily  Chronic respiratory failure: Due to OHS/OSA.  Follows with pulmonology  Chronic lymphedema:  Hypertension: On metoprolol 50 mg twice daily  RTC in***  Medication Adjustments/Labs and Tests Ordered: Current medicines are reviewed at length with the patient today.   Concerns regarding medicines are outlined above.  No orders of the defined types were placed in this encounter.  No orders of the defined types were placed in this encounter.   There are no Patient Instructions on file for this visit.   Signed, Little Ishikawa, MD  10/30/2022 8:57 PM    [facility]

## 2022-10-31 ENCOUNTER — Ambulatory Visit: Payer: Medicare HMO | Admitting: Cardiology

## 2022-11-16 DIAGNOSIS — H25813 Combined forms of age-related cataract, bilateral: Secondary | ICD-10-CM | POA: Diagnosis not present

## 2022-11-16 DIAGNOSIS — Z0100 Encounter for examination of eyes and vision without abnormal findings: Secondary | ICD-10-CM | POA: Diagnosis not present

## 2022-12-16 ENCOUNTER — Telehealth: Payer: Self-pay | Admitting: Adult Health

## 2022-12-16 ENCOUNTER — Other Ambulatory Visit: Payer: Self-pay

## 2022-12-16 DIAGNOSIS — I10 Essential (primary) hypertension: Secondary | ICD-10-CM

## 2022-12-16 MED ORDER — FUROSEMIDE 20 MG PO TABS
20.0000 mg | ORAL_TABLET | Freq: Every day | ORAL | 1 refills | Status: DC
Start: 2022-12-16 — End: 2023-03-07

## 2022-12-16 MED ORDER — POTASSIUM CHLORIDE CRYS ER 10 MEQ PO TBCR: 10.0000 meq | EXTENDED_RELEASE_TABLET | Freq: Every day | ORAL | 1 refills | Status: DC

## 2022-12-16 NOTE — Telephone Encounter (Signed)
Prescription Request  12/16/2022  LOV: 09/21/2022  What is the name of the medication or equipment?  potassium chloride (KLOR-CON M) 10 MEQ tablet furosemide (LASIX) 20 MG tablet  Have you contacted your pharmacy to request a refill? Yes   Which pharmacy would you like this sent to?  [id number] [place], [place] - [address] AT [facility] [address] [place] [address] Phone: [phone] Fax: [phone]    Patient notified that their request is being sent to the clinical staff for review and that they should receive a response within 2 business days.   Please advise at Mobile [phone] (mobile)

## 2022-12-16 NOTE — Telephone Encounter (Signed)
Rx refilled.

## 2022-12-21 ENCOUNTER — Encounter: Payer: Medicare HMO | Admitting: Adult Health

## 2022-12-21 NOTE — Progress Notes (Deleted)
Subjective:    Patient ID: Allison Short, female    DOB: 1950/11/09, 72 y.o.   MRN: [id number]  HPI Patient presents for yearly preventative medicine examination. She is a pleasant 72 year old female who  has a past medical history of Claustrophobia, History of blood transfusion, History of urinary tract infection, Hypertension, Non Hodgkin's lymphoma (HCC) (2008), Obesity, Osteopenia (04/2017), and Pancreatitis.  Prediabetes -last A1c was 6.4- 3 months ago.  We placed her on metformin 500 mg twice daily.  She does report that she has been tolerating this medication well  Lab Results  Component Value Date   HGBA1C 5.8 (A) 09/21/2022   HGBA1C 6.4 06/14/2022   HGBA1C 6.2 11/20/2018    HTN - currently managed with Metoprolol 25mg  BID and lasix 20 mg daily. She has been checking her blood pressure at home with readings in the 120-170 with a vast majority of her blood pressure readings in the 130-140 systolic.  BP Readings from Last 3 Encounters:  09/21/22 (!) 144/82  06/14/22 128/78  05/10/22 138/88    Obesity - she has been able to lose a total of 41.9 pounds. She is actively working on calorie counting and following a heathy diet. She is also walking around more often.  Wt Readings from Last 10 Encounters:  09/21/22 (!) 317 lb (143.8 kg)  09/16/22 (!) 319 lb 6.4 oz (144.9 kg)  05/10/22 (!) 343 lb (155.6 kg)  05/04/22 (!) 343 lb (155.6 kg)  04/13/22 (!) 369 lb 7.9 oz (167.6 kg)  08/20/20 (!) 373 lb 3.8 oz (169.3 kg)  06/24/19 (!) 346 lb (156.9 kg)  11/20/18 (!) 371 lb (168.3 kg)  04/12/17 (!) 336 lb (152.4 kg)  04/07/17 (!) 333 lb (151 kg)   H/o Non Hodgkins lymphoma -in 2008 she went through chemo and radiation as a prophylactic measure.  She was followed by oncology for 5 years after that and then discharged.  She is asymptomatic  All immunizations and health maintenance protocols were reviewed with the patient and needed orders were placed.  Appropriate screening laboratory  values were ordered for the patient including screening of hyperlipidemia, renal function and hepatic function.   Medication reconciliation,  past medical history, social history, problem list and allergies were reviewed in detail with the patient  Goals were established with regard to weight loss, exercise, and  diet in compliance with medications  She is up to date on routine colon cancer screening and mammograms.   Review of Systems  Constitutional: Negative.   HENT: Negative.    Eyes: Negative.   Respiratory: Negative.    Cardiovascular: Negative.   Gastrointestinal: Negative.   Endocrine: Negative.   Genitourinary: Negative.   Musculoskeletal: Negative.   Skin: Negative.   Allergic/Immunologic: Negative.   Neurological: Negative.   Hematological: Negative.   Psychiatric/Behavioral: Negative.     Past Medical History:  Diagnosis Date   Claustrophobia    History of blood transfusion    2016   History of urinary tract infection    Hypertension    Non Hodgkin's lymphoma (HCC) 2008   Obesity    Osteopenia 04/2017   T score -1.4 FRAX 7.5% / 0.8%   Pancreatitis     Social History   Socioeconomic History   Marital status: Divorced    Spouse name: Not on file   Number of children: Not on file   Years of education: Not on file   Highest education level: Some college, no degree  Occupational History  Not on file  Tobacco Use   Smoking status: Never   Smokeless tobacco: Never  Vaping Use   Vaping status: Never Used  Substance and Sexual Activity   Alcohol use: Yes    Alcohol/week: 0.0 standard drinks of alcohol    Comment: Occas   Drug use: No   Sexual activity: Not Currently    Comment: 1st intercourse 15 yo-5 partners  Other Topics Concern   Not on file  Social History Narrative   Not on file   Social Determinants of Health   Financial Resource Strain: Low Risk  (09/16/2022)   Overall Financial Resource Strain (CARDIA)    Difficulty of Paying Living  Expenses: Not hard at all  Food Insecurity: No Food Insecurity (09/16/2022)   Hunger Vital Sign    Worried About Running Out of Food in the Last Year: Never true    Ran Out of Food in the Last Year: Never true  Transportation Needs: No Transportation Needs (09/16/2022)   PRAPARE - Administrator, Civil Service (Medical): No    Lack of Transportation (Non-Medical): No  Recent Concern: Transportation Needs - Unmet Transportation Needs (07/15/2022)   PRAPARE - Transportation    Lack of Transportation (Medical): Yes    Lack of Transportation (Non-Medical): Yes  Physical Activity: Inactive (09/16/2022)   Exercise Vital Sign    Days of Exercise per Week: 0 days    Minutes of Exercise per Session: 0 min  Stress: No Stress Concern Present (09/16/2022)   [employer] of Occupational Health - Occupational Stress Questionnaire    Feeling of Stress : Not at all  Social Connections: Moderately Integrated (09/16/2022)   Social Connection and Isolation Panel [NHANES]    Frequency of Communication with Friends and Family: More than three times a week    Frequency of Social Gatherings with Friends and Family: More than three times a week    Attends Religious Services: More than 4 times per year    Active Member of [employer] or Organizations: Yes    Attends Engineer, structural: More than 4 times per year    Marital Status: Divorced  Intimate Partner Violence: Not At Risk (09/16/2022)   Humiliation, Afraid, Rape, and Kick questionnaire    Fear of Current or Ex-Partner: No    Emotionally Abused: No    Physically Abused: No    Sexually Abused: No    Past Surgical History:  Procedure Laterality Date   BIOPSY THYROID     BONE MARROW BIOPSY     CESAREAN SECTION  10/26/1975   CHOLECYSTECTOMY     2006   PORTA CATH INSERTION     TONSILLECTOMY AND ADENOIDECTOMY     1952   TOTAL KNEE ARTHROPLASTY Bilateral    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      Family History  Problem Relation Age of Onset    Arthritis Maternal Grandmother    Diabetes Son     Allergies  Allergen Reactions   Ciprofloxin Hcl [Ciprofloxacin] Other (See Comments)    Unstable gait   Morphine And Codeine Anaphylaxis and Other (See Comments)    Tolerates hydrocodone   Asa [Aspirin] Hives and Other (See Comments)    Hives can become SEVERE, requiring intervention   Codeine Hives   Chia Oil Swelling and Other (See Comments)    Possibly "chia tea" = Face and lips became swollen   Erythromycin Other (See Comments)    Reaction not recalled   Nsaids Other (See Comments)  Lowers GFR   Penicillins Other (See Comments)    Reaction not recalled- was told to "never take this again" by family    Current Outpatient Medications on File Prior to Visit  Medication Sig Dispense Refill   acetaminophen (TYLENOL) 500 MG tablet Take 1,000 mg by mouth every 8 (eight) hours as needed for mild pain, moderate pain or headache.     Cholecalciferol (VITAMIN D3) 25 MCG (1000 UT) capsule Take 1,000 Units by mouth daily.     cyanocobalamin (VITAMIN B12) 1000 MCG tablet Take 1 tablet (1,000 mcg total) by mouth daily.     furosemide (LASIX) 20 MG tablet Take 1 tablet (20 mg total) by mouth daily. 90 tablet 1   hydrOXYzine (ATARAX) 25 MG tablet Take 1 tablet (25 mg total) by mouth at bedtime as needed for anxiety. 90 tablet 1   ipratropium-albuterol (DUONEB) 0.5-2.5 (3) MG/3ML SOLN Take 3 mLs by nebulization every 6 (six) hours as needed. 1080 mL 1   metFORMIN (GLUCOPHAGE) 500 MG tablet TAKE 1 TABLET TWICE DAILY WITH MEALS 180 tablet 3   metoprolol tartrate (LOPRESSOR) 50 MG tablet TAKE 1 TABLET TWICE DAILY 180 tablet 3   MUCINEX SINUS-MAX CLEAR & COOL 0.05 % nasal spray Place 1 spray into both nostrils 2 (two) times daily as needed for congestion.     potassium chloride (KLOR-CON M) 10 MEQ tablet Take 1 tablet (10 mEq total) by mouth daily. 90 tablet 1   sodium chloride (OCEAN) 0.65 % SOLN nasal spray Place 1 spray into both nostrils as  needed for congestion.     UNKNOWN TO PATIENT Take 0.5-1 ampules by nebulization See admin instructions. Unnamed neb treatment for post-nasal drip and coughing- Nebulize the contents of 0.5-1 ampule and inhale into the lungs once a day as needed for mucus removal or coughing     No current facility-administered medications on file prior to visit.    There were no vitals taken for this visit.      Objective:   Physical Exam Vitals and nursing note reviewed.  Constitutional:      General: She is not in acute distress.    Appearance: Normal appearance. She is not ill-appearing.  HENT:     Head: Normocephalic and atraumatic.     Right Ear: Tympanic membrane, ear canal and external ear normal. There is no impacted cerumen.     Left Ear: Tympanic membrane, ear canal and external ear normal. There is no impacted cerumen.     Nose: Nose normal. No congestion or rhinorrhea.     Mouth/Throat:     Mouth: Mucous membranes are moist.     Pharynx: Oropharynx is clear.  Eyes:     Extraocular Movements: Extraocular movements intact.     Conjunctiva/sclera: Conjunctivae normal.     Pupils: Pupils are equal, round, and reactive to light.  Neck:     Vascular: No carotid bruit.  Cardiovascular:     Rate and Rhythm: Normal rate and regular rhythm.     Pulses: Normal pulses.     Heart sounds: No murmur heard.    No friction rub. No gallop.  Pulmonary:     Effort: Pulmonary effort is normal.     Breath sounds: Normal breath sounds.  Abdominal:     General: Abdomen is flat. Bowel sounds are normal. There is no distension.     Palpations: Abdomen is soft. There is no mass.     Tenderness: There is no abdominal tenderness. There is no  guarding or rebound.     Hernia: No hernia is present.  Musculoskeletal:        General: Normal range of motion.     Cervical back: Normal range of motion and neck supple.  Lymphadenopathy:     Cervical: No cervical adenopathy.  Skin:    General: Skin is warm and  dry.     Capillary Refill: Capillary refill takes less than 2 seconds.  Neurological:     General: No focal deficit present.     Mental Status: She is alert and oriented to person, place, and time.  Psychiatric:        Mood and Affect: Mood normal.        Behavior: Behavior normal.        Thought Content: Thought content normal.        Judgment: Judgment normal.           Assessment & Plan:

## 2022-12-29 ENCOUNTER — Ambulatory Visit: Payer: Medicare HMO | Admitting: Cardiology

## 2023-01-06 ENCOUNTER — Encounter: Payer: Self-pay | Admitting: Adult Health

## 2023-01-06 ENCOUNTER — Other Ambulatory Visit: Payer: Self-pay | Admitting: Adult Health

## 2023-01-06 MED ORDER — CLINDAMYCIN HCL 300 MG PO CAPS: 900.0000 mg | ORAL_CAPSULE | Freq: Once | ORAL | 0 refills | Status: DC

## 2023-01-06 NOTE — Telephone Encounter (Signed)
Please advise 

## 2023-01-12 ENCOUNTER — Encounter: Payer: Medicare HMO | Admitting: Adult Health

## 2023-02-09 ENCOUNTER — Encounter: Payer: Medicare HMO | Admitting: Adult Health

## 2023-02-10 ENCOUNTER — Ambulatory Visit: Payer: Medicare HMO | Admitting: Cardiology

## 2023-03-06 ENCOUNTER — Encounter: Payer: Self-pay | Admitting: Adult Health

## 2023-03-06 ENCOUNTER — Telehealth: Payer: Self-pay

## 2023-03-06 NOTE — Telephone Encounter (Signed)
 Copied from CRM [id number]. Topic: General - Other >> Mar 06, 2023 12:19 PM Deaijah H wrote: Reason for CRM: Patient would like to speak with Dr. Evelene Croon or Dr. Evelene Croon nurse / please call [phone]

## 2023-03-07 ENCOUNTER — Other Ambulatory Visit: Payer: Self-pay

## 2023-03-07 DIAGNOSIS — I10 Essential (primary) hypertension: Secondary | ICD-10-CM

## 2023-03-07 DIAGNOSIS — F419 Anxiety disorder, unspecified: Secondary | ICD-10-CM

## 2023-03-07 MED ORDER — METOPROLOL TARTRATE 50 MG PO TABS
50.0000 mg | ORAL_TABLET | Freq: Two times a day (BID) | ORAL | 1 refills | Status: AC
  Filled 2024-01-10: qty 180, 90d supply, fill #0

## 2023-03-07 MED ORDER — POTASSIUM CHLORIDE CRYS ER 10 MEQ PO TBCR: 10.0000 meq | EXTENDED_RELEASE_TABLET | Freq: Every day | ORAL | 1 refills | Status: DC

## 2023-03-07 MED ORDER — METFORMIN HCL 500 MG PO TABS
500.0000 mg | ORAL_TABLET | Freq: Two times a day (BID) | ORAL | 1 refills | Status: AC
  Filled 2024-01-22: qty 180, 90d supply, fill #0

## 2023-03-07 MED ORDER — FUROSEMIDE 20 MG PO TABS: 20.0000 mg | ORAL_TABLET | Freq: Every day | ORAL | 1 refills | Status: DC

## 2023-03-07 NOTE — Telephone Encounter (Signed)
 Spoke to pt and she stated she loss transportation due to insurance. Pt stated that she found a transportation service but Allison Short will need to fill out the medical form. Pt stated that she will need her medication refilled until she can be seen.

## 2023-03-07 NOTE — Telephone Encounter (Signed)
Prescription sent in for 6 months

## 2023-03-09 ENCOUNTER — Encounter: Payer: Medicare HMO | Admitting: Adult Health

## 2023-03-23 ENCOUNTER — Other Ambulatory Visit: Payer: Medicare HMO

## 2023-03-23 ENCOUNTER — Ambulatory Visit: Payer: Medicare HMO

## 2023-03-31 ENCOUNTER — Ambulatory Visit: Payer: Medicare HMO | Admitting: Cardiology

## 2023-05-29 ENCOUNTER — Ambulatory Visit (INDEPENDENT_AMBULATORY_CARE_PROVIDER_SITE_OTHER)

## 2023-05-29 VITALS — Ht 68.0 in | Wt 317.0 lb

## 2023-05-29 DIAGNOSIS — Z0000 Encounter for general adult medical examination without abnormal findings: Secondary | ICD-10-CM

## 2023-05-29 NOTE — Progress Notes (Signed)
 Subjective:   Allison Short is a 73 y.o. who presents for a Medicare Wellness preventive visit.  Visit Complete: Virtual I connected with  [name] on 05/29/23 by a audio enabled telemedicine application and verified that I am speaking with the correct person using two identifiers.  Patient Location: Home  Provider Location: Home Office  I discussed the limitations of evaluation and management by telemedicine. The patient expressed understanding and agreed to proceed.  Vital Signs: Because this visit was a virtual/telehealth visit, some criteria may be missing or patient reported. Any vitals not documented were not able to be obtained and vitals that have been documented are patient reported.    Persons Participating in Visit: Patient.  AWV Questionnaire: No: Patient Medicare AWV questionnaire was not completed prior to this visit.  Cardiac Risk Factors include: advanced age (>48men, >19 women);hypertension     Objective:    Today's Vitals   05/29/23 1006  Weight: (!) 317 lb (143.8 kg)  Height: 5\' 8"  (1.727 m)   Body mass index is 48.2 kg/m.     05/29/2023   10:23 AM 09/16/2022   12:54 PM 04/08/2022   11:00 PM 09/13/2021   12:46 PM 11/04/2020    2:00 PM 09/09/2020    1:08 PM 08/20/2020   11:00 PM  Advanced Directives  Does Patient Have a Medical Advance Directive? No No No No No Yes No  Type of Careers adviser;Living will   Copy of Healthcare Power of Attorney in Chart?      No - copy requested   Would patient like information on creating a medical advance directive? No - Patient declined No - Patient declined No - Patient declined No - Patient declined No - Patient declined  No - Patient declined    Current Medications (verified) Outpatient Encounter Medications as of 05/29/2023  Medication Sig   acetaminophen (TYLENOL) 500 MG tablet Take 1,000 mg by mouth every 8 (eight) hours as needed for mild pain, moderate pain or headache.    Cholecalciferol (VITAMIN D3) 25 MCG (1000 UT) capsule Take 1,000 Units by mouth daily.   cyanocobalamin (VITAMIN B12) 1000 MCG tablet Take 1 tablet (1,000 mcg total) by mouth daily.   furosemide (LASIX) 20 MG tablet Take 1 tablet (20 mg total) by mouth daily.   hydrOXYzine (ATARAX) 25 MG tablet Take 1 tablet (25 mg total) by mouth at bedtime as needed for anxiety.   ipratropium-albuterol (DUONEB) 0.5-2.5 (3) MG/3ML SOLN Take 3 mLs by nebulization every 6 (six) hours as needed. (Patient not taking: Reported on 05/29/2023)   metFORMIN (GLUCOPHAGE) 500 MG tablet Take 1 tablet (500 mg total) by mouth 2 (two) times daily with a meal.   metoprolol tartrate (LOPRESSOR) 50 MG tablet Take 1 tablet (50 mg total) by mouth 2 (two) times daily.   MUCINEX SINUS-MAX CLEAR & COOL 0.05 % nasal spray Place 1 spray into both nostrils 2 (two) times daily as needed for congestion.   potassium chloride (KLOR-CON M) 10 MEQ tablet Take 1 tablet (10 mEq total) by mouth daily.   sodium chloride (OCEAN) 0.65 % SOLN nasal spray Place 1 spray into both nostrils as needed for congestion.   UNKNOWN TO PATIENT Take 0.5-1 ampules by nebulization See admin instructions. Unnamed neb treatment for post-nasal drip and coughing- Nebulize the contents of 0.5-1 ampule and inhale into the lungs once a day as needed for mucus removal or coughing   No facility-administered encounter  medications on file as of 05/29/2023.    Allergies (verified) Ciprofloxin hcl [ciprofloxacin], Morphine and codeine, Asa [aspirin], Codeine, Chia oil, Erythromycin, Nsaids, and Penicillins   History: Past Medical History:  Diagnosis Date   Claustrophobia    History of blood transfusion    2016   History of urinary tract infection    Hypertension    Non Hodgkin's lymphoma (HCC) 2008   Obesity    Osteopenia 04/2017   T score -1.4 FRAX 7.5% / 0.8%   Pancreatitis    Past Surgical History:  Procedure Laterality Date   BIOPSY THYROID     BONE MARROW  BIOPSY     CESAREAN SECTION  10/26/1975   CHOLECYSTECTOMY     2006   PORTA CATH INSERTION     TONSILLECTOMY AND ADENOIDECTOMY     1952   TOTAL KNEE ARTHROPLASTY Bilateral    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     Family History  Problem Relation Age of Onset   Arthritis Maternal Grandmother    Diabetes Son    Social History   Socioeconomic History   Marital status: Divorced    Spouse name: Not on file   Number of children: Not on file   Years of education: Not on file   Highest education level: Some college, no degree  Occupational History   Not on file  Tobacco Use   Smoking status: Never   Smokeless tobacco: Never  Vaping Use   Vaping status: Never Used  Substance and Sexual Activity   Alcohol use: Yes    Alcohol/week: 0.0 standard drinks of alcohol    Comment: Occas   Drug use: No   Sexual activity: Not Currently    Comment: 1st intercourse 15 yo-5 partners  Other Topics Concern   Not on file  Social History Narrative   Not on file   Social Drivers of Health   Financial Resource Strain: Low Risk  (05/29/2023)   Overall Financial Resource Strain (CARDIA)    Difficulty of Paying Living Expenses: Not hard at all  Food Insecurity: No Food Insecurity (05/29/2023)   Hunger Vital Sign    Worried About Running Out of Food in the Last Year: Never true    Ran Out of Food in the Last Year: Never true  Transportation Needs: Unmet Transportation Needs (05/29/2023)   PRAPARE - Transportation    Lack of Transportation (Medical): Yes    Lack of Transportation (Non-Medical): Yes  Physical Activity: Inactive (05/29/2023)   Exercise Vital Sign    Days of Exercise per Week: 0 days    Minutes of Exercise per Session: 0 min  Stress: No Stress Concern Present (05/29/2023)   [employer] of Occupational Health - Occupational Stress Questionnaire    Feeling of Stress : Not at all  Social Connections: Moderately Isolated (05/29/2023)   Social Connection and Isolation Panel [NHANES]    Frequency of  Communication with Friends and Family: More than three times a week    Frequency of Social Gatherings with Friends and Family: More than three times a week    Attends Religious Services: More than 4 times per year    Active Member of [employer] or Organizations: No    Attends Banker Meetings: Never    Marital Status: Divorced    Tobacco Counseling Counseling given: Not Answered    Clinical Intake:  Pre-visit preparation completed: Yes  Pain : No/denies pain     BMI - recorded: 48.2 Nutritional Status: BMI > 30  Obese  Nutritional Risks: None Diabetes: No  Lab Results  Component Value Date   HGBA1C 5.8 (A) 09/21/2022   HGBA1C 6.4 06/14/2022   HGBA1C 6.2 11/20/2018     How often do you need to have someone help you when you read instructions, pamphlets, or other written materials from your doctor or pharmacy?: 1 - Never  Interpreter Needed?: No  Information entered by :: Theresa Mulligan LPN   Activities of Daily Living     05/29/2023   10:21 AM 09/16/2022   12:53 PM  In your present state of health, do you have any difficulty performing the following activities:  Hearing? 0 0  Vision? 0 0  Difficulty concentrating or making decisions? 0 0  Walking or climbing stairs? 1 0  Comment Uses a Walker   Dressing or bathing? 0 0  Doing errands, shopping? 0 0  Preparing Food and eating ? N N  Using the Toilet? N N  In the past six months, have you accidently leaked urine? N N  Do you have problems with loss of bowel control? N N  Managing your Medications? N N  Managing your Finances? N N  Housekeeping or managing your Housekeeping? N N    Patient Care Team: Shirline Frees, NP as PCP - General (Family Medicine) Fontaine, Nadyne Coombes, MD (Inactive) as Consulting Physician (Gynecology) Sherrill Raring, [facility] (Pharmacist)  Indicate any recent Medical Services you may have received from other than [facility] providers in the past year (date may be approximate).      Assessment:   This is a routine wellness examination for [facility].  Hearing/Vision screen Hearing Screening - Comments:: Denies hearing difficulties   Vision Screening - Comments:: Wears rx glasses - up to date with routine eye exams with  [facility]   Goals Addressed               This Visit's Progress     Increase physical activity (pt-stated)         Depression Screen     05/29/2023   10:20 AM 09/16/2022   12:52 PM 09/13/2021   12:39 PM 09/09/2020    1:10 PM 09/09/2020    1:05 PM 07/08/2015   11:59 AM  PHQ 2/9 Scores  PHQ - 2 Score 0 0 0 0 0 0    Fall Risk     05/29/2023   10:22 AM 09/20/2022   11:03 AM 09/16/2022   12:53 PM 09/15/2022    2:41 PM 09/13/2021   12:46 PM  Fall Risk   Falls in the past year? 0 0 0 0 0  Number falls in past yr: 0  0  0  Injury with Fall? 0  0  0  Risk for fall due to : No Fall Risks  No Fall Risks  No Fall Risks  Follow up Falls prevention discussed;Falls evaluation completed  Falls prevention discussed      MEDICARE RISK AT HOME:  Medicare Risk at Home Any stairs in or around the home?: No If so, are there any without handrails?: No Home free of loose throw rugs in walkways, pet beds, electrical cords, etc?: Yes Adequate lighting in your home to reduce risk of falls?: Yes Life alert?: No Use of a cane, walker or w/c?: Yes Grab bars in the bathroom?: Yes Shower chair or bench in shower?: Yes Elevated toilet seat or a handicapped toilet?: Yes  TIMED UP AND GO:  Was the test performed?  No  Cognitive Function: 6CIT  completed        05/29/2023   10:24 AM 09/16/2022   12:54 PM 09/13/2021   12:47 PM  6CIT Screen  What Year? 0 points 0 points 0 points  What month? 0 points 0 points 0 points  What time? 0 points 0 points 0 points  Count back from 20 0 points 0 points 0 points  Months in reverse 0 points 0 points 0 points  Repeat phrase 0 points 0 points 0 points  Total Score 0 points 0 points 0 points    Immunizations  There  is no immunization history on file for this patient.  Screening Tests Health Maintenance  Topic Date Due   COVID-19 Vaccine (1) Never done   Zoster Vaccines- Shingrix (1 of 2) Never done   MAMMOGRAM  04/20/2018   Pneumonia Vaccine 34+ Years old (1 of 2 - PCV) 09/16/2023 (Originally 07/24/1956)   Colonoscopy  09/16/2023 (Originally 03/25/2019)   Hepatitis C Screening  09/16/2023 (Originally 07/24/1968)   INFLUENZA VACCINE  09/22/2023   Medicare Annual Wellness (AWV)  05/28/2024   DEXA SCAN  Completed   HPV VACCINES  Aged Out   DTaP/Tdap/Td  Discontinued    Health Maintenance  Health Maintenance Due  Topic Date Due   COVID-19 Vaccine (1) Never done   Zoster Vaccines- Shingrix (1 of 2) Never done   MAMMOGRAM  04/20/2018   Health Maintenance Items Addressed: Patient deferred Mammogram  Additional Screening:  Vision Screening: Recommended annual ophthalmology exams for early detection of glaucoma and other disorders of the eye.  Dental Screening: Recommended annual dental exams for proper oral hygiene  Community Resource Referral / Chronic Care Management: CRR required this visit?  No   CCM required this visit?  No     Plan:     I have personally reviewed and noted the following in the patient's chart:   Medical and social history Use of alcohol, tobacco or illicit drugs  Current medications and supplements including opioid prescriptions. Patient is not currently taking opioid prescriptions. Functional ability and status Nutritional status Physical activity Advanced directives List of other physicians Hospitalizations, surgeries, and ER visits in previous 12 months Vitals Screenings to include cognitive, depression, and falls Referrals and appointments  In addition, I have reviewed and discussed with patient certain preventive protocols, quality metrics, and best practice recommendations. A written personalized care plan for preventive services as well as general  preventive health recommendations were provided to patient.     Tillie Rung, LPN   08/29/2954   After Visit Summary: (MyChart) Due to this being a telephonic visit, the after visit summary with patients personalized plan was offered to patient via MyChart   Notes: Nothing significant to report at this time.

## 2023-05-29 NOTE — Patient Instructions (Addendum)
 Ms. Novosad , Thank you for taking time to come for your Medicare Wellness Visit. I appreciate your ongoing commitment to your health goals. Please review the following plan we discussed and let me know if I can assist you in the future.   Referrals/Orders/Follow-Ups/Clinician Recommendations:   This is a list of the screening recommended for you and due dates:  Health Maintenance  Topic Date Due   COVID-19 Vaccine (1) Never done   Zoster (Shingles) Vaccine (1 of 2) Never done   Mammogram  04/20/2018   Pneumonia Vaccine (1 of 2 - PCV) 09/16/2023*   Colon Cancer Screening  09/16/2023*   Hepatitis C Screening  09/16/2023*   Flu Shot  09/22/2023   Medicare Annual Wellness Visit  05/28/2024   DEXA scan (bone density measurement)  Completed   HPV Vaccine  Aged Out   DTaP/Tdap/Td vaccine  Discontinued  *Topic was postponed. The date shown is not the original due date.    Advanced directives: (Declined) Advance directive discussed with you today. Even though you declined this today, please call our office should you change your mind, and we can give you the proper paperwork for you to fill out.  Next Medicare Annual Wellness Visit scheduled for next year: Yes

## 2023-06-18 ENCOUNTER — Other Ambulatory Visit: Payer: Self-pay

## 2023-06-18 ENCOUNTER — Encounter (HOSPITAL_COMMUNITY): Payer: Self-pay

## 2023-06-18 ENCOUNTER — Inpatient Hospital Stay (HOSPITAL_COMMUNITY)
Admission: EM | Admit: 2023-06-18 | Discharge: 2023-06-27 | DRG: 291 | Disposition: A | Discharge status: Skilled Nursing Facility | Attending: Internal Medicine | Admitting: Internal Medicine

## 2023-06-18 ENCOUNTER — Emergency Department (HOSPITAL_COMMUNITY)

## 2023-06-18 DIAGNOSIS — M6281 Muscle weakness (generalized): Secondary | ICD-10-CM | POA: Diagnosis not present

## 2023-06-18 DIAGNOSIS — L03116 Cellulitis of left lower limb: Secondary | ICD-10-CM | POA: Diagnosis present

## 2023-06-18 DIAGNOSIS — Z751 Person awaiting admission to adequate facility elsewhere: Secondary | ICD-10-CM | POA: Diagnosis not present

## 2023-06-18 DIAGNOSIS — Z7984 Long term (current) use of oral hypoglycemic drugs: Secondary | ICD-10-CM | POA: Diagnosis not present

## 2023-06-18 DIAGNOSIS — F40240 Claustrophobia: Secondary | ICD-10-CM | POA: Diagnosis present

## 2023-06-18 DIAGNOSIS — R7303 Prediabetes: Secondary | ICD-10-CM | POA: Diagnosis present

## 2023-06-18 DIAGNOSIS — L03119 Cellulitis of unspecified part of limb: Secondary | ICD-10-CM | POA: Diagnosis not present

## 2023-06-18 DIAGNOSIS — M8580 Other specified disorders of bone density and structure, unspecified site: Secondary | ICD-10-CM | POA: Diagnosis present

## 2023-06-18 DIAGNOSIS — I5031 Acute diastolic (congestive) heart failure: Secondary | ICD-10-CM | POA: Diagnosis not present

## 2023-06-18 DIAGNOSIS — Z79899 Other long term (current) drug therapy: Secondary | ICD-10-CM

## 2023-06-18 DIAGNOSIS — L0390 Cellulitis, unspecified: Secondary | ICD-10-CM | POA: Diagnosis not present

## 2023-06-18 DIAGNOSIS — I5033 Acute on chronic diastolic (congestive) heart failure: Secondary | ICD-10-CM | POA: Diagnosis present

## 2023-06-18 DIAGNOSIS — L97821 Non-pressure chronic ulcer of other part of left lower leg limited to breakdown of skin: Secondary | ICD-10-CM | POA: Diagnosis present

## 2023-06-18 DIAGNOSIS — Z888 Allergy status to other drugs, medicaments and biological substances status: Secondary | ICD-10-CM

## 2023-06-18 DIAGNOSIS — L03115 Cellulitis of right lower limb: Secondary | ICD-10-CM | POA: Diagnosis present

## 2023-06-18 DIAGNOSIS — L97919 Non-pressure chronic ulcer of unspecified part of right lower leg with unspecified severity: Secondary | ICD-10-CM | POA: Diagnosis not present

## 2023-06-18 DIAGNOSIS — E66813 Obesity, class 3: Secondary | ICD-10-CM | POA: Diagnosis not present

## 2023-06-18 DIAGNOSIS — L97929 Non-pressure chronic ulcer of unspecified part of left lower leg with unspecified severity: Secondary | ICD-10-CM | POA: Insufficient documentation

## 2023-06-18 DIAGNOSIS — L97521 Non-pressure chronic ulcer of other part of left foot limited to breakdown of skin: Secondary | ICD-10-CM | POA: Diagnosis present

## 2023-06-18 DIAGNOSIS — R0600 Dyspnea, unspecified: Secondary | ICD-10-CM | POA: Diagnosis not present

## 2023-06-18 DIAGNOSIS — J9811 Atelectasis: Secondary | ICD-10-CM | POA: Diagnosis not present

## 2023-06-18 DIAGNOSIS — R262 Difficulty in walking, not elsewhere classified: Secondary | ICD-10-CM | POA: Diagnosis not present

## 2023-06-18 DIAGNOSIS — Z833 Family history of diabetes mellitus: Secondary | ICD-10-CM

## 2023-06-18 DIAGNOSIS — D519 Vitamin B12 deficiency anemia, unspecified: Secondary | ICD-10-CM | POA: Diagnosis present

## 2023-06-18 DIAGNOSIS — I110 Hypertensive heart disease with heart failure: Principal | ICD-10-CM | POA: Diagnosis present

## 2023-06-18 DIAGNOSIS — Z5982 Transportation insecurity: Secondary | ICD-10-CM | POA: Diagnosis not present

## 2023-06-18 DIAGNOSIS — Z6841 Body Mass Index (BMI) 40.0 and over, adult: Secondary | ICD-10-CM

## 2023-06-18 DIAGNOSIS — L97811 Non-pressure chronic ulcer of other part of right lower leg limited to breakdown of skin: Secondary | ICD-10-CM | POA: Diagnosis present

## 2023-06-18 DIAGNOSIS — G4733 Obstructive sleep apnea (adult) (pediatric): Secondary | ICD-10-CM | POA: Diagnosis not present

## 2023-06-18 DIAGNOSIS — I5023 Acute on chronic systolic (congestive) heart failure: Secondary | ICD-10-CM | POA: Diagnosis not present

## 2023-06-18 DIAGNOSIS — R5381 Other malaise: Secondary | ICD-10-CM | POA: Diagnosis present

## 2023-06-18 DIAGNOSIS — I10 Essential (primary) hypertension: Secondary | ICD-10-CM | POA: Diagnosis not present

## 2023-06-18 DIAGNOSIS — Z96653 Presence of artificial knee joint, bilateral: Secondary | ICD-10-CM | POA: Diagnosis present

## 2023-06-18 DIAGNOSIS — Z880 Allergy status to penicillin: Secondary | ICD-10-CM

## 2023-06-18 DIAGNOSIS — Z87892 Personal history of anaphylaxis: Secondary | ICD-10-CM

## 2023-06-18 DIAGNOSIS — Z713 Dietary counseling and surveillance: Secondary | ICD-10-CM

## 2023-06-18 DIAGNOSIS — J9612 Chronic respiratory failure with hypercapnia: Secondary | ICD-10-CM | POA: Diagnosis present

## 2023-06-18 DIAGNOSIS — Z8261 Family history of arthritis: Secondary | ICD-10-CM

## 2023-06-18 DIAGNOSIS — I890 Lymphedema, not elsewhere classified: Principal | ICD-10-CM | POA: Diagnosis present

## 2023-06-18 DIAGNOSIS — Z886 Allergy status to analgesic agent status: Secondary | ICD-10-CM

## 2023-06-18 DIAGNOSIS — Z881 Allergy status to other antibiotic agents status: Secondary | ICD-10-CM

## 2023-06-18 DIAGNOSIS — L97511 Non-pressure chronic ulcer of other part of right foot limited to breakdown of skin: Secondary | ICD-10-CM | POA: Diagnosis present

## 2023-06-18 DIAGNOSIS — Z87440 Personal history of urinary (tract) infections: Secondary | ICD-10-CM

## 2023-06-18 DIAGNOSIS — E8770 Fluid overload, unspecified: Secondary | ICD-10-CM | POA: Diagnosis not present

## 2023-06-18 DIAGNOSIS — Z7401 Bed confinement status: Secondary | ICD-10-CM | POA: Diagnosis not present

## 2023-06-18 DIAGNOSIS — J9611 Chronic respiratory failure with hypoxia: Secondary | ICD-10-CM | POA: Diagnosis present

## 2023-06-18 DIAGNOSIS — E662 Morbid (severe) obesity with alveolar hypoventilation: Secondary | ICD-10-CM | POA: Diagnosis present

## 2023-06-18 DIAGNOSIS — C859A Non-Hodgkin lymphoma, unspecified, in remission: Secondary | ICD-10-CM | POA: Diagnosis present

## 2023-06-18 DIAGNOSIS — J90 Pleural effusion, not elsewhere classified: Secondary | ICD-10-CM | POA: Diagnosis not present

## 2023-06-18 DIAGNOSIS — Z7182 Exercise counseling: Secondary | ICD-10-CM

## 2023-06-18 DIAGNOSIS — I517 Cardiomegaly: Secondary | ICD-10-CM | POA: Diagnosis not present

## 2023-06-18 DIAGNOSIS — R531 Weakness: Secondary | ICD-10-CM

## 2023-06-18 DIAGNOSIS — Z885 Allergy status to narcotic agent status: Secondary | ICD-10-CM

## 2023-06-18 NOTE — ED Triage Notes (Signed)
 Pt BIB EMS from home for chronic leg edema. Pt takes lasix  with no relief. Pt stated the swelling is usually below the knee but is now in her upper leg. Pt now has issues with walking and skin tears.   154/82 96% RA 88 HR

## 2023-06-18 NOTE — ED Provider Notes (Incomplete)
 [place] [facility]PARTMENT AT [facility] Provider Note   CSN: [id number] Arrival date & time: 06/18/23  2159     History {Add pertinent medical, surgical, social history, OB history to HPI:1} Chief Complaint  Patient presents with  . Leg Swelling    Allison Short is a 73 y.o. female.  HPI     Home Medications Prior to Admission medications   Medication Sig Start Date End Date Taking? Authorizing Provider  acetaminophen  (TYLENOL ) 500 MG tablet Take 1,000 mg by mouth every 8 (eight) hours as needed for mild pain, moderate pain or headache.    [provider]  Cholecalciferol  (VITAMIN D3) 25 MCG (1000 UT) capsule Take 1,000 Units by mouth daily.    [provider]  cyanocobalamin  (VITAMIN B12) 1000 MCG tablet Take 1 tablet (1,000 mcg total) by mouth daily. 04/13/22   Armenta Landau, MD  furosemide  (LASIX ) 20 MG tablet Take 1 tablet (20 mg total) by mouth daily. 03/07/23   Nafziger, Randel Buss, NP  hydrOXYzine  (ATARAX ) 25 MG tablet Take 1 tablet (25 mg total) by mouth at bedtime as needed for anxiety. 06/14/22   Nafziger, Randel Buss, NP  ipratropium-albuterol  (DUONEB) 0.5-2.5 (3) MG/3ML SOLN Take 3 mLs by nebulization every 6 (six) hours as needed. Patient not taking: Reported on 05/29/2023 04/25/22   Armenta Landau, MD  metFORMIN  (GLUCOPHAGE ) 500 MG tablet Take 1 tablet (500 mg total) by mouth 2 (two) times daily with a meal. 03/07/23   Nafziger, Randel Buss, NP  metoprolol  tartrate (LOPRESSOR ) 50 MG tablet Take 1 tablet (50 mg total) by mouth 2 (two) times daily. 03/07/23   Nafziger, Randel Buss, NP  MUCINEX  SINUS-MAX CLEAR & COOL 0.05 % nasal spray Place 1 spray into both nostrils 2 (two) times daily as needed for congestion.    [provider]  potassium chloride  (KLOR-CON  M) 10 MEQ tablet Take 1 tablet (10 mEq total) by mouth daily. 03/07/23   Nafziger, Randel Buss, NP  sodium chloride  (OCEAN) 0.65 % SOLN nasal spray Place 1 spray into both nostrils as needed for congestion.     [provider]  UNKNOWN TO PATIENT Take 0.5-1 ampules by nebulization See admin instructions. Unnamed neb treatment for post-nasal drip and coughing- Nebulize the contents of 0.5-1 ampule and inhale into the lungs once a day as needed for mucus removal or coughing    [provider]      Allergies    Ciprofloxin hcl [ciprofloxacin], Morphine and codeine, Asa [aspirin], Codeine, Chia oil, Erythromycin, Nsaids, and Penicillins    Review of Systems   Review of Systems  Physical Exam Updated Vital Signs BP (!) 176/91   Pulse 87   Temp 98.1 F (36.7 C)   Resp 18   SpO2 96%  Physical Exam     ED Results / Procedures / Treatments   Labs (all labs ordered are listed, but only abnormal results are displayed) Labs Reviewed  BASIC METABOLIC PANEL WITH GFR  CBC WITH DIFFERENTIAL/PLATELET  BRAIN NATRIURETIC PEPTIDE    EKG None  Radiology No results found.  Procedures Procedures  {Document cardiac monitor, telemetry assessment procedure when appropriate:1}  Medications Ordered in ED Medications - No data to display  ED Course/ Medical Decision Making/ A&P   {   Click here for ABCD2, HEART and other calculatorsREFRESH Note before signing :1}  Medical Decision Making Amount and/or Complexity of Data Reviewed Labs: ordered. Radiology: ordered.   ***  {Document critical care time when appropriate:1} {Document review of labs and clinical decision tools ie heart score, Chads2Vasc2 etc:1}  {Document your independent review of radiology images, and any outside records:1} {Document your discussion with family members, caretakers, and with consultants:1} {Document social determinants of health affecting pt's care:1} {Document your decision making why or why not admission, treatments were needed:1} Final Clinical Impression(s) / ED Diagnoses Final diagnoses:  None    Rx / DC Orders ED Discharge Orders     None

## 2023-06-18 NOTE — ED Provider Notes (Signed)
 [place] EM[facility]GENCY DEPARTMENT AT [facility] Provider Note   CSN: [id number] Arrival date & time: 06/18/23  2159     History  Chief Complaint  Patient presents with   Leg Swelling    Allison Short is a 73 y.o. female.  Patient presents to the emergency room via EMS complaining of bilateral lower extremity edema.  Patient with history of lymphedema.  She states she has been seen previously at the lymphedema clinic and was discharged home once they stated they were unable to do anymore for her.  She has a lymphedema pump that she states she normally uses at home but is currently unable to get around her leg.  She tries to use compression but states that due to increased swelling she is unable to use that at this time.  The patient reportedly is unable to ambulate due to pain and excess fluid.  She denies abdominal pain, nausea, vomiting, shortness of breath.  Past medical history significant for lymphedema, non-Hodgkin's lymphoma, hypertension, morbid obesity, CHF  HPI     Home Medications Prior to Admission medications   Medication Sig Start Date End Date Taking? Authorizing Provider  acetaminophen  (TYLENOL ) 500 MG tablet Take 1,000 mg by mouth every 8 (eight) hours as needed for mild pain, moderate pain or headache.    [provider]  Cholecalciferol  (VITAMIN D3) 25 MCG (1000 UT) capsule Take 1,000 Units by mouth daily.    [provider]  cyanocobalamin  (VITAMIN B12) 1000 MCG tablet Take 1 tablet (1,000 mcg total) by mouth daily. 04/13/22   Armenta Landau, MD  furosemide  (LASIX ) 20 MG tablet Take 1 tablet (20 mg total) by mouth daily. 03/07/23   Nafziger, Randel Buss, NP  hydrOXYzine  (ATARAX ) 25 MG tablet Take 1 tablet (25 mg total) by mouth at bedtime as needed for anxiety. 06/14/22   Nafziger, Randel Buss, NP  ipratropium-albuterol  (DUONEB) 0.5-2.5 (3) MG/3ML SOLN Take 3 mLs by nebulization every 6 (six) hours as needed. Patient not taking: Reported on 05/29/2023 04/25/22    Armenta Landau, MD  metFORMIN  (GLUCOPHAGE ) 500 MG tablet Take 1 tablet (500 mg total) by mouth 2 (two) times daily with a meal. 03/07/23   Nafziger, Randel Buss, NP  metoprolol  tartrate (LOPRESSOR ) 50 MG tablet Take 1 tablet (50 mg total) by mouth 2 (two) times daily. 03/07/23   Nafziger, Randel Buss, NP  MUCINEX  SINUS-MAX CLEAR & COOL 0.05 % nasal spray Place 1 spray into both nostrils 2 (two) times daily as needed for congestion.    [provider]  potassium chloride  (KLOR-CON  M) 10 MEQ tablet Take 1 tablet (10 mEq total) by mouth daily. 03/07/23   Nafziger, Cory, NP  sodium chloride  (OCEAN) 0.65 % SOLN nasal spray Place 1 spray into both nostrils as needed for congestion.    [provider]  UNKNOWN TO PATIENT Take 0.5-1 ampules by nebulization See admin instructions. Unnamed neb treatment for post-nasal drip and coughing- Nebulize the contents of 0.5-1 ampule and inhale into the lungs once a day as needed for mucus removal or coughing    [provider]      Allergies    Ciprofloxin hcl [ciprofloxacin], Morphine and codeine, Asa [aspirin], Codeine, Chia oil, Erythromycin, Nsaids, and Penicillins    Review of Systems   Review of Systems  Physical Exam Updated Vital Signs BP (!) 160/76   Pulse 84   Temp 98 F (36.7 C)   Resp 18   SpO2 96%  Physical Exam Vitals and nursing note  reviewed.  Constitutional:      General: She is not in acute distress.    Appearance: She is well-developed. She is obese.  HENT:     Head: Normocephalic and atraumatic.  Eyes:     Conjunctiva/sclera: Conjunctivae normal.  Cardiovascular:     Rate and Rhythm: Normal rate and regular rhythm.  Pulmonary:     Effort: Pulmonary effort is normal. No respiratory distress.     Breath sounds: Normal breath sounds.  Abdominal:     Palpations: Abdomen is soft.     Tenderness: There is no abdominal tenderness.  Musculoskeletal:     Cervical back: Neck supple.     Right lower leg: Edema present.      Left lower leg: Edema present.     Comments: Bilateral lower extremities with significant swelling and skin changes consistent with lymphedema.  Some weeping noted .Please see images below. Pedal pulse located with doppler bilaterally  Skin:    General: Skin is warm and dry.     Capillary Refill: Capillary refill takes less than 2 seconds.  Neurological:     Mental Status: She is alert.  Psychiatric:        Mood and Affect: Mood normal.   Right leg  Left leg     Right foot ED Results / Procedures / Treatments   Labs (all labs ordered are listed, but only abnormal results are displayed) Labs Reviewed  BASIC METABOLIC PANEL WITH GFR - Abnormal; Notable for the following components:      Result Value   Glucose, Bld 134 (*)    BUN 24 (*)    All other components within normal limits  CBC WITH DIFFERENTIAL/PLATELET - Abnormal; Notable for the following components:   RBC 3.62 (*)    Hemoglobin 11.2 (*)    MCV 101.9 (*)    RDW 16.2 (*)    All other components within normal limits  BRAIN NATRIURETIC PEPTIDE - Abnormal; Notable for the following components:   B Natriuretic Peptide 367.1 (*)    All other components within normal limits    EKG None  Radiology DG Chest 2 View Result Date: 06/18/2023 CLINICAL DATA:  Fluid overload EXAM: CHEST - 2 VIEW COMPARISON:  None Available. FINDINGS: Lungs are clear. No pneumothorax. Small bilateral pleural effusions are present. Cardiac size is enlarged unchanged. Pulmonary vascularity is. Osseous structures are age-appropriate. No acute bone abnormality. IMPRESSION: 1. Small bilateral pleural effusions. 2. Stable cardiomegaly. Electronically Signed   By: Worthy Heads M.D.   On: 06/18/2023 23:48    Procedures Procedures    Medications Ordered in ED Medications  vancomycin  (VANCOREADY) IVPB 2000 mg/400 mL (2,000 mg Intravenous New Bag/Given 06/19/23 0238)  furosemide  (LASIX ) injection 40 mg (40 mg Intravenous Given 06/19/23 0234)     ED Course/ Medical Decision Making/ A&P                                 Medical Decision Making Amount and/or Complexity of Data Reviewed Labs: ordered. Radiology: ordered.  Risk Prescription drug management. Decision regarding hospitalization.   This patient presents to the ED for concern of bilateral lower extremity edema, this involves an extensive number of treatment options, and is a complaint that carries with it a high risk of complications and morbidity.  The differential diagnosis includes lymphedema, cellulitis, other fluid retention, DVT, others   Co morbidities that complicate the patient evaluation  Edema  Additional history obtained:  Additional history obtained from EMS   Lab Tests:  I Ordered, and personally interpreted labs.  The pertinent results include: BNP 367.1, no leukocytosis, potassium 4.2   Imaging Studies ordered:  I ordered imaging studies including chest x-ray I independently visualized and interpreted imaging which showed  1. Small bilateral pleural effusions.  2. Stable cardiomegaly.   I agree with the radiologist interpretation   Problem List / ED Course / Critical interventions / Medication management   I ordered medication including Lasix  for diuresis, vancomycin  for cellulitis Reevaluation of the patient after these medicines showed that the patient stayed the same I have reviewed the patients home medicines and have made adjustments as needed   Consultations Obtained:  I requested consultation with the hospitalist, Dr. Michell Ahumada,  and discussed lab and imaging findings as well as pertinent plan - they recommend: admission   Social Determinants of Health:  Social Drivers of Health with Concerns   Transportation Needs: Unmet Transportation Needs (05/29/2023)   PRAPARE - Transportation    Lack of Transportation (Medical): Yes    Lack of Transportation (Non-Medical): Yes  Physical Activity: Inactive (05/29/2023)    Exercise Vital Sign    Days of Exercise per Week: 0 days    Minutes of Exercise per Session: 0 min  Social Connections: Moderately Isolated (05/29/2023)   Social Connection and Isolation Panel [NHANES]    Frequency of Communication with Friends and Family: More than three times a week    Frequency of Social Gatherings with Friends and Family: More than three times a week    Attends Religious Services: More than 4 times per year    Active Member of [employer] or Organizations: No    Attends Banker Meetings: Never    Marital Status: Divorced  Housing: Unknown (05/29/2023)   Housing Stability Vital Sign    Unable to Pay for Housing in the Last Year: No    Number of Times Moved in the Last Year: Not on file    Homeless in the Last Year: No      Test / Admission - Considered:  Patient with severe lymphedema.  Right leg concerning for cellulitis with drainage.  Right leg is erythematous with increased drainage.  Drainage appears mostly to be due to likely lymphedema but cannot rule out some drainage related to cellulitis as well.  Patient unable to ambulate due to pain and increased swelling.  Feel the patient would benefit from admission for IV antibiotics, diuresis, possible DVT study in the morning as DVT study is not available overnight.  I do feel the DVT is less likely based on presentation.         Final Clinical Impression(s) / ED Diagnoses Final diagnoses:  Lymphedema  Cellulitis of right lower extremity    Rx / DC Orders ED Discharge Orders     None         Delories Fetter 06/19/23 0253    Ballard Bongo, MD 06/19/23 [phone]

## 2023-06-19 ENCOUNTER — Inpatient Hospital Stay (HOSPITAL_COMMUNITY)

## 2023-06-19 DIAGNOSIS — Z79899 Other long term (current) drug therapy: Secondary | ICD-10-CM | POA: Diagnosis not present

## 2023-06-19 DIAGNOSIS — I5023 Acute on chronic systolic (congestive) heart failure: Secondary | ICD-10-CM | POA: Diagnosis not present

## 2023-06-19 DIAGNOSIS — I5033 Acute on chronic diastolic (congestive) heart failure: Secondary | ICD-10-CM | POA: Diagnosis present

## 2023-06-19 DIAGNOSIS — I10 Essential (primary) hypertension: Secondary | ICD-10-CM | POA: Diagnosis not present

## 2023-06-19 DIAGNOSIS — D519 Vitamin B12 deficiency anemia, unspecified: Secondary | ICD-10-CM | POA: Diagnosis present

## 2023-06-19 DIAGNOSIS — J9612 Chronic respiratory failure with hypercapnia: Secondary | ICD-10-CM | POA: Diagnosis present

## 2023-06-19 DIAGNOSIS — R0600 Dyspnea, unspecified: Secondary | ICD-10-CM | POA: Diagnosis not present

## 2023-06-19 DIAGNOSIS — Z6841 Body Mass Index (BMI) 40.0 and over, adult: Secondary | ICD-10-CM | POA: Diagnosis not present

## 2023-06-19 DIAGNOSIS — R7303 Prediabetes: Secondary | ICD-10-CM | POA: Diagnosis not present

## 2023-06-19 DIAGNOSIS — I5031 Acute diastolic (congestive) heart failure: Secondary | ICD-10-CM

## 2023-06-19 DIAGNOSIS — L97919 Non-pressure chronic ulcer of unspecified part of right lower leg with unspecified severity: Secondary | ICD-10-CM | POA: Diagnosis not present

## 2023-06-19 DIAGNOSIS — I110 Hypertensive heart disease with heart failure: Secondary | ICD-10-CM | POA: Diagnosis present

## 2023-06-19 DIAGNOSIS — C859A Non-Hodgkin lymphoma, unspecified, in remission: Secondary | ICD-10-CM | POA: Diagnosis present

## 2023-06-19 DIAGNOSIS — F40240 Claustrophobia: Secondary | ICD-10-CM | POA: Diagnosis present

## 2023-06-19 DIAGNOSIS — J90 Pleural effusion, not elsewhere classified: Secondary | ICD-10-CM | POA: Diagnosis not present

## 2023-06-19 DIAGNOSIS — Z751 Person awaiting admission to adequate facility elsewhere: Secondary | ICD-10-CM | POA: Diagnosis not present

## 2023-06-19 DIAGNOSIS — L97811 Non-pressure chronic ulcer of other part of right lower leg limited to breakdown of skin: Secondary | ICD-10-CM | POA: Diagnosis present

## 2023-06-19 DIAGNOSIS — G4733 Obstructive sleep apnea (adult) (pediatric): Secondary | ICD-10-CM | POA: Diagnosis not present

## 2023-06-19 DIAGNOSIS — E66813 Obesity, class 3: Secondary | ICD-10-CM | POA: Diagnosis not present

## 2023-06-19 DIAGNOSIS — L97511 Non-pressure chronic ulcer of other part of right foot limited to breakdown of skin: Secondary | ICD-10-CM | POA: Diagnosis present

## 2023-06-19 DIAGNOSIS — J9611 Chronic respiratory failure with hypoxia: Secondary | ICD-10-CM | POA: Diagnosis present

## 2023-06-19 DIAGNOSIS — J9811 Atelectasis: Secondary | ICD-10-CM | POA: Diagnosis not present

## 2023-06-19 DIAGNOSIS — E662 Morbid (severe) obesity with alveolar hypoventilation: Secondary | ICD-10-CM | POA: Diagnosis present

## 2023-06-19 DIAGNOSIS — L03116 Cellulitis of left lower limb: Secondary | ICD-10-CM | POA: Diagnosis not present

## 2023-06-19 DIAGNOSIS — L03119 Cellulitis of unspecified part of limb: Secondary | ICD-10-CM | POA: Diagnosis not present

## 2023-06-19 DIAGNOSIS — Z7984 Long term (current) use of oral hypoglycemic drugs: Secondary | ICD-10-CM | POA: Diagnosis not present

## 2023-06-19 DIAGNOSIS — M8580 Other specified disorders of bone density and structure, unspecified site: Secondary | ICD-10-CM | POA: Diagnosis present

## 2023-06-19 DIAGNOSIS — R262 Difficulty in walking, not elsewhere classified: Secondary | ICD-10-CM | POA: Diagnosis not present

## 2023-06-19 DIAGNOSIS — Z96653 Presence of artificial knee joint, bilateral: Secondary | ICD-10-CM | POA: Diagnosis present

## 2023-06-19 DIAGNOSIS — I517 Cardiomegaly: Secondary | ICD-10-CM | POA: Diagnosis not present

## 2023-06-19 DIAGNOSIS — L97821 Non-pressure chronic ulcer of other part of left lower leg limited to breakdown of skin: Secondary | ICD-10-CM | POA: Diagnosis present

## 2023-06-19 DIAGNOSIS — R5381 Other malaise: Secondary | ICD-10-CM | POA: Diagnosis present

## 2023-06-19 DIAGNOSIS — L97521 Non-pressure chronic ulcer of other part of left foot limited to breakdown of skin: Secondary | ICD-10-CM | POA: Diagnosis present

## 2023-06-19 DIAGNOSIS — Z5982 Transportation insecurity: Secondary | ICD-10-CM | POA: Diagnosis not present

## 2023-06-19 DIAGNOSIS — I890 Lymphedema, not elsewhere classified: Secondary | ICD-10-CM | POA: Diagnosis not present

## 2023-06-19 DIAGNOSIS — L03115 Cellulitis of right lower limb: Secondary | ICD-10-CM | POA: Diagnosis not present

## 2023-06-19 DIAGNOSIS — M6281 Muscle weakness (generalized): Secondary | ICD-10-CM | POA: Diagnosis not present

## 2023-06-19 LAB — CBC WITH DIFFERENTIAL/PLATELET
Abs Immature Granulocytes: 0.06 10*3/uL (ref 0.00–0.07)
Basophils Absolute: 0.1 10*3/uL (ref 0.0–0.1)
Basophils Relative: 1 %
Eosinophils Absolute: 0.3 10*3/uL (ref 0.0–0.5)
Eosinophils Relative: 3 %
HCT: 36.9 % (ref 36.0–46.0)
Hemoglobin: 11.2 g/dL — ABNORMAL LOW (ref 12.0–15.0)
Immature Granulocytes: 1 %
Lymphocytes Relative: 16 %
Lymphs Abs: 1.5 10*3/uL (ref 0.7–4.0)
MCH: 30.9 pg (ref 26.0–34.0)
MCHC: 30.4 g/dL (ref 30.0–36.0)
MCV: 101.9 fL — ABNORMAL HIGH (ref 80.0–100.0)
Monocytes Absolute: 0.9 10*3/uL (ref 0.1–1.0)
Monocytes Relative: 9 %
Neutro Abs: 6.9 10*3/uL (ref 1.7–7.7)
Neutrophils Relative %: 70 %
Platelets: 278 10*3/uL (ref 150–400)
RBC: 3.62 MIL/uL — ABNORMAL LOW (ref 3.87–5.11)
RDW: 16.2 % — ABNORMAL HIGH (ref 11.5–15.5)
WBC: 9.8 10*3/uL (ref 4.0–10.5)
nRBC: 0 % (ref 0.0–0.2)

## 2023-06-19 LAB — ECHOCARDIOGRAM COMPLETE
AR max vel: 2.02 cm2
AV Area VTI: 2.08 cm2
AV Area mean vel: 2.03 cm2
AV Mean grad: 7 mmHg
AV Peak grad: 11.7 mmHg
Ao pk vel: 1.71 m/s
Area-P 1/2: 3.93 cm2
Height: 68 in
MV VTI: 3 cm2
S' Lateral: 3.6 cm
Single Plane A4C EF: 52.4 %
Weight: 5992.98 [oz_av]

## 2023-06-19 LAB — COMPREHENSIVE METABOLIC PANEL WITH GFR
ALT: 12 U/L (ref 0–44)
AST: 14 U/L — ABNORMAL LOW (ref 15–41)
Albumin: 3.4 g/dL — ABNORMAL LOW (ref 3.5–5.0)
Alkaline Phosphatase: 117 U/L (ref 38–126)
Anion gap: 11 (ref 5–15)
BUN: 22 mg/dL (ref 8–23)
CO2: 28 mmol/L (ref 22–32)
Calcium: 9 mg/dL (ref 8.9–10.3)
Chloride: 102 mmol/L (ref 98–111)
Creatinine, Ser: 0.92 mg/dL (ref 0.44–1.00)
GFR, Estimated: >60 mL/min (ref >60)
Glucose, Bld: 129 mg/dL — ABNORMAL HIGH (ref 70–99)
Potassium: 4.1 mmol/L (ref 3.5–5.1)
Sodium: 141 mmol/L (ref 135–145)
Total Bilirubin: 1 mg/dL (ref 0.0–1.2)
Total Protein: 7 g/dL (ref 6.5–8.1)

## 2023-06-19 LAB — BASIC METABOLIC PANEL WITH GFR
Anion gap: 10 (ref 5–15)
BUN: 24 mg/dL — ABNORMAL HIGH (ref 8–23)
CO2: 28 mmol/L (ref 22–32)
Calcium: 9 mg/dL (ref 8.9–10.3)
Chloride: 103 mmol/L (ref 98–111)
Creatinine, Ser: 0.92 mg/dL (ref 0.44–1.00)
GFR, Estimated: >60 mL/min (ref >60)
Glucose, Bld: 134 mg/dL — ABNORMAL HIGH (ref 70–99)
Potassium: 4.2 mmol/L (ref 3.5–5.1)
Sodium: 141 mmol/L (ref 135–145)

## 2023-06-19 LAB — HEMOGLOBIN A1C
Hgb A1c MFr Bld: 5.9 % — ABNORMAL HIGH (ref 4.8–5.6)
Mean Plasma Glucose: 122.63 mg/dL

## 2023-06-19 LAB — BRAIN NATRIURETIC PEPTIDE: B Natriuretic Peptide: 367.1 pg/mL — ABNORMAL HIGH (ref 0.0–100.0)

## 2023-06-19 MED ORDER — METOPROLOL TARTRATE 25 MG PO TABS
50.0000 mg | ORAL_TABLET | Freq: Two times a day (BID) | ORAL | Status: DC
Start: 2023-06-19 — End: 2023-06-19

## 2023-06-19 MED ORDER — ACETAMINOPHEN 325 MG PO TABS
650.0000 mg | ORAL_TABLET | Freq: Four times a day (QID) | ORAL | Status: DC | PRN
  Administered 2023-06-20 – 2023-06-24 (×5): 650 mg via ORAL
  Filled 2023-06-19 (×5): qty 2

## 2023-06-19 MED ORDER — VITAMIN D3 1000 UNITS PO CAPS: 1000.0000 [IU] | ORAL_CAPSULE | Freq: Every day | ORAL | Status: DC

## 2023-06-19 MED ORDER — POLYETHYLENE GLYCOL 3350 17 G PO PACK
17.0000 g | PACK | Freq: Two times a day (BID) | ORAL | Status: AC
  Administered 2023-06-19 – 2023-06-20 (×3): 17 g via ORAL
  Filled 2023-06-19 (×4): qty 1

## 2023-06-19 MED ORDER — HYDRALAZINE HCL 20 MG/ML IJ SOLN: 5.0000 mg | Freq: Four times a day (QID) | INTRAMUSCULAR | Status: DC | PRN

## 2023-06-19 MED ORDER — FUROSEMIDE 10 MG/ML IJ SOLN
40.0000 mg | Freq: Once | INTRAMUSCULAR | Status: AC
  Administered 2023-06-19: 40 mg via INTRAVENOUS
  Filled 2023-06-19: qty 4

## 2023-06-19 MED ORDER — VANCOMYCIN HCL 1750 MG/350ML IV SOLN
1750.0000 mg | INTRAVENOUS | Status: DC
  Administered 2023-06-19: 1750 mg via INTRAVENOUS
  Filled 2023-06-19: qty 350

## 2023-06-19 MED ORDER — VANCOMYCIN HCL IN DEXTROSE 1-5 GM/200ML-% IV SOLN: 1000.0000 mg | Freq: Once | INTRAVENOUS | Status: DC

## 2023-06-19 MED ORDER — ENOXAPARIN SODIUM 60 MG/0.6ML IJ SOSY
60.0000 mg | PREFILLED_SYRINGE | INTRAMUSCULAR | Status: DC
  Administered 2023-06-19 – 2023-06-20 (×2): 60 mg via SUBCUTANEOUS
  Filled 2023-06-19 (×2): qty 0.6

## 2023-06-19 MED ORDER — HYDROXYZINE HCL 10 MG PO TABS
10.0000 mg | ORAL_TABLET | Freq: Once | ORAL | Status: AC | PRN
  Administered 2023-06-19: 10 mg via ORAL
  Filled 2023-06-19: qty 1

## 2023-06-19 MED ORDER — ACETAMINOPHEN 650 MG RE SUPP: 650.0000 mg | Freq: Four times a day (QID) | RECTAL | Status: DC | PRN

## 2023-06-19 MED ORDER — POTASSIUM CHLORIDE CRYS ER 20 MEQ PO TBCR
20.0000 meq | EXTENDED_RELEASE_TABLET | Freq: Every day | ORAL | Status: DC
  Filled 2023-06-19: qty 1

## 2023-06-19 MED ORDER — ONDANSETRON HCL 4 MG/2ML IJ SOLN
4.0000 mg | Freq: Four times a day (QID) | INTRAMUSCULAR | Status: DC | PRN
  Administered 2023-06-22 – 2023-06-23 (×2): 4 mg via INTRAVENOUS
  Filled 2023-06-19 (×2): qty 2

## 2023-06-19 MED ORDER — HYDROXYZINE HCL 25 MG PO TABS
25.0000 mg | ORAL_TABLET | Freq: Every evening | ORAL | Status: DC | PRN
  Administered 2023-06-19 – 2023-06-20 (×2): 25 mg via ORAL
  Filled 2023-06-19 (×2): qty 1

## 2023-06-19 MED ORDER — METOPROLOL TARTRATE 50 MG PO TABS
50.0000 mg | ORAL_TABLET | Freq: Two times a day (BID) | ORAL | Status: DC
  Administered 2023-06-19 – 2023-06-27 (×17): 50 mg via ORAL
  Filled 2023-06-19 (×10): qty 1
  Filled 2023-06-19: qty 2
  Filled 2023-06-19 (×6): qty 1

## 2023-06-19 MED ORDER — FUROSEMIDE 10 MG/ML IJ SOLN
40.0000 mg | Freq: Two times a day (BID) | INTRAMUSCULAR | Status: DC
  Administered 2023-06-19 (×2): 40 mg via INTRAVENOUS
  Filled 2023-06-19 (×3): qty 4

## 2023-06-19 MED ORDER — VITAMIN D 25 MCG (1000 UNIT) PO TABS
1000.0000 [IU] | ORAL_TABLET | Freq: Every day | ORAL | Status: DC
  Administered 2023-06-19 – 2023-06-27 (×9): 1000 [IU] via ORAL
  Filled 2023-06-19 (×9): qty 1

## 2023-06-19 MED ORDER — PERFLUTREN LIPID MICROSPHERE
1.0000 mL | INTRAVENOUS | Status: AC | PRN
  Administered 2023-06-19: 2 mL via INTRAVENOUS

## 2023-06-19 MED ORDER — VANCOMYCIN HCL 2000 MG/400ML IV SOLN
2000.0000 mg | Freq: Once | INTRAVENOUS | Status: AC
  Administered 2023-06-19: 2000 mg via INTRAVENOUS
  Filled 2023-06-19: qty 400

## 2023-06-19 NOTE — Progress Notes (Signed)
  Echocardiogram 2D Echocardiogram has been performed.  Maycen Degregory L Arisbeth Purrington RDCS 06/19/2023, 2:02 PM

## 2023-06-19 NOTE — Consult Note (Addendum)
 WOC Nurse Consult Note: patient with longstanding history of lymphedema followed previously by lymphedema clinic, has lymphedema pumps at home; uncertain of any recent follow-up or care for lymphedema  Reason for Consult: lower extremity ulcers and lymphedema  Wound type: full and partial thickness ulcers B lower legs and dorsal feet  Pressure Injury POA: na, not related to pressure  Measurement: see nursing flowsheet  Wound bed: partial thickness skin loss lower legs pink and moist; full thickness dorsal feet pink and moist  Drainage (amount, consistency, odor) weeping serous fluid per MD note  Periwound: edema, erythema; chronic changes consistent with lymphedema  Dressing procedure/placement/frequency:  Cleanse B lower legs and dorsal feet (intact skin and ulcerations) with Vashe wound cleanser Timm Foot [phone]), do not rinse and allow to air dry. Apply silver hydrofiber ([employer] Lawson [phone]) to foot and leg wounds daily, cover with ABD pad and secure with Kerlix roll gauze wrapped beginning just above toes and ending right below knees.  May apply Ace bandage wrapped in same fashion as Kerlix for light compression.   Patient should resume follow-up with lymphedema clinic or wound care clinic for ongoing management of lymphedema with resultant ulcerations.   POC discussed with bedside nurse. WOC team will not follow. Re-consult if further needs arise.   Thank you,    Ronni Colace MSN, RN-BC, [employer] [phone]

## 2023-06-19 NOTE — H&P (Signed)
 History and Physical    Allison Short WUJ:[id number] DOB: September 23, 1950 DOA: 06/18/2023  PCP: Alto Atta, NP  Patient coming from: Home  Chief Complaint: Bilateral leg swelling  HPI: Allison Short is a 73 y.o. female with medical history significant of hypertension, morbid obesity, prediabetes, OSA/OHS on CPAP, chronic hypoxemic hypercapnic respiratory failure, non-Hodgkin's lymphoma in remission, chronic HFpEF, chronic lymphedema, B12 deficiency anemia presenting with bilateral lower extremity edema. She has a lymphedema pump that she normally uses at home but unable to get it around her legs due to significant swelling and also has not been able to use compression stockings.  She is concerned that her legs are so swollen that they are continuously seeping fluid.  It is difficult for her to walk even a few steps due to pain in her feet.  She takes Lasix  20 mg once daily.  Reporting exertional dyspnea.  Denies chest pain.  Denies fevers or chills.  No other complaints.  ED Course: Temperature 98.1 F, pulse 87, respiratory rate 18, blood pressure 176/91, and SpO2 96% on room air. Labs showing no leukocytosis, hemoglobin 11.2 (baseline 11-13), creatinine 0.9, BNP 367. Chest x-ray showing small bilateral pleural effusions. Patient was given IV Lasix  40 mg and vancomycin .   Review of Systems:  Review of Systems  All other systems reviewed and are negative.   Past Medical History:  Diagnosis Date   Claustrophobia    History of blood transfusion    2016   History of urinary tract infection    Hypertension    Non Hodgkin's lymphoma (HCC) 2008   Obesity    Osteopenia 04/2017   T score -1.4 FRAX 7.5% / 0.8%   Pancreatitis     Past Surgical History:  Procedure Laterality Date   BIOPSY THYROID      BONE MARROW BIOPSY     CESAREAN SECTION  10/26/1975   CHOLECYSTECTOMY     2006   PORTA CATH INSERTION     TONSILLECTOMY AND ADENOIDECTOMY     1952   TOTAL KNEE ARTHROPLASTY Bilateral    Tumor on  scalp       reports that she has never smoked. She has never used smokeless tobacco. She reports current alcohol use. She reports that she does not use drugs.  Allergies  Allergen Reactions   Ciprofloxin Hcl [Ciprofloxacin] Other (See Comments)    Unstable gait   Morphine And Codeine Anaphylaxis and Other (See Comments)    Tolerates hydrocodone    Asa [Aspirin] Hives and Other (See Comments)    Hives can become SEVERE, requiring intervention   Codeine Hives   Chia Oil Swelling and Other (See Comments)    Possibly "chia tea" = Face and lips became swollen   Erythromycin Other (See Comments)    Reaction not recalled   Nsaids Other (See Comments)    Lowers GFR   Penicillins Other (See Comments)    Reaction not recalled- was told to "never take this again" by family    Family History  Problem Relation Age of Onset   Arthritis Maternal Grandmother    Diabetes Son     Prior to Admission medications   Medication Sig Start Date End Date Taking? Authorizing Provider  acetaminophen  (TYLENOL ) 500 MG tablet Take 1,000 mg by mouth every 8 (eight) hours as needed for mild pain, moderate pain or headache.    [provider]  Cholecalciferol  (VITAMIN D3) 25 MCG (1000 UT) capsule Take 1,000 Units by mouth daily.    [provider]  cyanocobalamin  (VITAMIN B12) 1000 MCG tablet Take 1 tablet (1,000 mcg total) by mouth daily. 04/13/22   Armenta Landau, MD  furosemide  (LASIX ) 20 MG tablet Take 1 tablet (20 mg total) by mouth daily. 03/07/23   Nafziger, Randel Buss, NP  hydrOXYzine  (ATARAX ) 25 MG tablet Take 1 tablet (25 mg total) by mouth at bedtime as needed for anxiety. 06/14/22   Nafziger, Randel Buss, NP  ipratropium-albuterol  (DUONEB) 0.5-2.5 (3) MG/3ML SOLN Take 3 mLs by nebulization every 6 (six) hours as needed. Patient not taking: Reported on 05/29/2023 04/25/22   Armenta Landau, MD  metFORMIN  (GLUCOPHAGE ) 500 MG tablet Take 1 tablet (500 mg total) by mouth 2 (two) times daily with a  meal. 03/07/23   Nafziger, Randel Buss, NP  metoprolol  tartrate (LOPRESSOR ) 50 MG tablet Take 1 tablet (50 mg total) by mouth 2 (two) times daily. 03/07/23   Nafziger, Randel Buss, NP  MUCINEX  SINUS-MAX CLEAR & COOL 0.05 % nasal spray Place 1 spray into both nostrils 2 (two) times daily as needed for congestion.    [provider]  potassium chloride  (KLOR-CON  M) 10 MEQ tablet Take 1 tablet (10 mEq total) by mouth daily. 03/07/23   Nafziger, Randel Buss, NP  sodium chloride  (OCEAN) 0.65 % SOLN nasal spray Place 1 spray into both nostrils as needed for congestion.    [provider]  UNKNOWN TO PATIENT Take 0.5-1 ampules by nebulization See admin instructions. Unnamed neb treatment for post-nasal drip and coughing- Nebulize the contents of 0.5-1 ampule and inhale into the lungs once a day as needed for mucus removal or coughing    [provider]    Physical Exam: Vitals:   06/18/23 2214 06/19/23 0124  BP: (!) 176/91 (!) 160/76  Pulse: 87 84  Resp: 18 18  Temp: 98.1 F (36.7 C) 98 F (36.7 C)  SpO2: 96% 96%    Physical Exam Vitals reviewed.  Constitutional:      General: She is not in acute distress. HENT:     Head: Normocephalic and atraumatic.  Eyes:     Extraocular Movements: Extraocular movements intact.  Cardiovascular:     Rate and Rhythm: Normal rate and regular rhythm.     Pulses: Normal pulses.  Pulmonary:     Effort: Pulmonary effort is normal. No respiratory distress.     Breath sounds: No wheezing or rales.  Abdominal:     General: Bowel sounds are normal. There is no distension.     Palpations: Abdomen is soft.     Tenderness: There is no abdominal tenderness. There is no guarding.  Musculoskeletal:     Cervical back: Normal range of motion.     Right lower leg: Edema present.     Left lower leg: Edema present.     Comments: Bilateral lower extremity edema/lymphedema Erythema of bilateral lower extremities but the right lower extremity appears more  erythematous and warm to touch  Skin:    General: Skin is warm and dry.  Neurological:     General: No focal deficit present.     Mental Status: She is alert and oriented to person, place, and time.            Labs on Admission: I have personally reviewed following labs and imaging studies  CBC: Recent Labs  Lab 06/18/23 0013  WBC 9.8  NEUTROABS 6.9  HGB 11.2*  HCT 36.9  MCV 101.9*  PLT 278   Basic Metabolic Panel: Recent Labs  Lab 06/18/23 0013  NA 141  K 4.2  CL 103  CO2 28  GLUCOSE 134*  BUN 24*  CREATININE 0.92  CALCIUM 9.0   GFR: CrCl cannot be calculated (Unknown ideal weight.). Liver Function Tests: No results for input(s): "AST", "ALT", "ALKPHOS", "BILITOT", "PROT", "ALBUMIN " in the last 168 hours. No results for input(s): "LIPASE", "AMYLASE" in the last 168 hours. No results for input(s): "AMMONIA" in the last 168 hours. Coagulation Profile: No results for input(s): "INR", "PROTIME" in the last 168 hours. Cardiac Enzymes: No results for input(s): "CKTOTAL", "CKMB", "CKMBINDEX", "TROPONINI" in the last 168 hours. BNP (last 3 results) No results for input(s): "PROBNP" in the last 8760 hours. HbA1C: No results for input(s): "HGBA1C" in the last 72 hours. CBG: No results for input(s): "GLUCAP" in the last 168 hours. Lipid Profile: No results for input(s): "CHOL", "HDL", "LDLCALC", "TRIG", "CHOLHDL", "LDLDIRECT" in the last 72 hours. Thyroid  Function Tests: No results for input(s): "TSH", "T4TOTAL", "FREET4", "T3FREE", "THYROIDAB" in the last 72 hours. Anemia Panel: No results for input(s): "VITAMINB12", "FOLATE", "FERRITIN", "TIBC", "IRON", "RETICCTPCT" in the last 72 hours. Urine analysis:    Component Value Date/Time   COLORURINE YELLOW 04/14/2022 1608   APPEARANCEUR CLEAR 04/14/2022 1608   LABSPEC 1.010 04/14/2022 1608   PHURINE 5.0 04/14/2022 1608   GLUCOSEU NEGATIVE 04/14/2022 1608   HGBUR NEGATIVE 04/14/2022 1608   BILIRUBINUR  NEGATIVE 04/14/2022 1608   KETONESUR NEGATIVE 04/14/2022 1608   PROTEINUR NEGATIVE 04/14/2022 1608   NITRITE NEGATIVE 04/14/2022 1608   LEUKOCYTESUR NEGATIVE 04/14/2022 1608    Radiological Exams on Admission: DG Chest 2 View Result Date: 06/18/2023 CLINICAL DATA:  Fluid overload EXAM: CHEST - 2 VIEW COMPARISON:  None Available. FINDINGS: Lungs are clear. No pneumothorax. Small bilateral pleural effusions are present. Cardiac size is enlarged unchanged. Pulmonary vascularity is. Osseous structures are age-appropriate. No acute bone abnormality. IMPRESSION: 1. Small bilateral pleural effusions. 2. Stable cardiomegaly. Electronically Signed   By: Worthy Heads M.D.   On: 06/18/2023 23:48    Assessment and Plan  Acute on chronic HFpEF Significant bilateral lower extremity edema Chronic lymphedema Last echo done in March 2024 showing EF 55 to 60%.  BNP 367.  Chest x-ray showing small bilateral pleural effusions.  Not hypoxemic.  Continue diuresis with IV Lasix  40 mg twice daily.  Monitor intake and output, daily weights, renal function.  Low-sodium diet with fluid restriction.  Repeat echocardiogram ordered.  Cellulitis of lower extremities No fever, leukocytosis, or signs of sepsis.  Continue vancomycin  and monitor closely.  Hypertension Most recent SBP in the 160s.  Continue IV Lasix  and home metoprolol .  OSA/OHS Continue nightly CPAP.  Prediabetes A1c 5.8 in July 2024, repeat ordered.  DVT prophylaxis: Lovenox  Code Status: Full Code (discussed with the patient) Family Communication: No family available at this time. Level of care: Telemetry bed Admission status: It is my clinical opinion that admission to INPATIENT is reasonable and necessary because of the expectation that this patient will require hospital care that crosses at least 2 midnights to treat this condition based on the medical complexity of the problems presented.  Given the aforementioned information, the  predictability of an adverse outcome is felt to be significant.  Juliette Oh MD [facility]  If 7PM-7AM, please contact night-coverage [email or web address]  06/19/2023, 2:49 AM

## 2023-06-19 NOTE — Progress Notes (Signed)
 Pharmacy Antibiotic Note  Allison Short is a 73 y.o. female admitted on 06/18/2023 with cellulitis.  Pharmacy has been consulted for Vancomycin  dosing.  Weight from earlier this month (143.8kg) was used to calculate Vancomycin  dose and estimate renal function (CrCl ~ 1ml/min)  Plan: Vancomycin  1750mg  IV q24h to target AUC 400-550.   Estimated AUC on this regimen = 494. Monitor renal function and cx data  Check levels as needed      Temp (24hrs), Avg:98.1 F (36.7 C), Min:98 F (36.7 C), Max:98.1 F (36.7 C)  Recent Labs  Lab 06/18/23 0013  WBC 9.8  CREATININE 0.92    CrCl cannot be calculated (Unknown ideal weight.).    Allergies  Allergen Reactions   Ciprofloxin Hcl [Ciprofloxacin] Other (See Comments)    Unstable gait   Morphine And Codeine Anaphylaxis and Other (See Comments)    Tolerates hydrocodone    Asa [Aspirin] Hives and Other (See Comments)    Hives can become SEVERE, requiring intervention   Codeine Hives   Chia Oil Swelling and Other (See Comments)    Possibly "chia tea" = Face and lips became swollen   Erythromycin Other (See Comments)    Reaction not recalled   Nsaids Other (See Comments)    Lowers GFR   Penicillins Other (See Comments)    Reaction not recalled- was told to "never take this again" by family    Antimicrobials this admission: 4/28 Vanc >>   Dose adjustments this admission:  Microbiology results:  Thank you for allowing pharmacy to be a part of this patient's care.  Arie Kurtz PharmD 06/19/2023 3:26 AM

## 2023-06-19 NOTE — ED Notes (Signed)
 ED TO INPATIENT HANDOFF REPORT  Name/Age/Gender Allison Short 73 y.o. female  Code Status    Code Status Orders  (From admission, onward)           Start     Ordered   06/19/23 0302  Full code  Continuous       Question:  By:  Answer:  Consent: discussion documented in EHR   06/19/23 0306           Code Status History     Date Active Date Inactive Code Status Order ID Comments User Context   04/08/2022 1955 04/29/2022 1708 Full Code [id number]  Raymona Caldwell, MD ED   08/20/2020 2242 08/26/2020 2218 Full Code [id number]  Chotiner, Pauline Bos, MD Inpatient       Home/SNF/Other Home  Chief Complaint Acute on chronic heart failure with preserved ejection fraction (HFpEF) (HCC) [I50.33]  Level of Care/Admitting Diagnosis ED Disposition     ED Disposition  Admit   Condition  --   Comment  Hospital Area: [facility] [[id number]]  Level of Care: Telemetry [5]  Admit to tele based on following criteria: Acute CHF  May admit patient to [facility] or [facility] if equivalent level of care is available:: Yes  Covid Evaluation: Asymptomatic - no recent exposure (last 10 days) testing not required  Diagnosis: Acute on chronic heart failure with preserved ejection fraction (HFpEF) [facility]) [[id number]]  Admitting Physician: Juliette Oh [[id number]]  Attending Physician: RATHORE, VASUNDHRA [[id number]]  Certification:: I certify this patient will need inpatient services for at least 2 midnights  Expected Medical Readiness: 06/21/2023          Medical History Past Medical History:  Diagnosis Date   Claustrophobia    History of blood transfusion    2016   History of urinary tract infection    Hypertension    Non Hodgkin's lymphoma (HCC) 2008   Obesity    Osteopenia 04/2017   T score -1.4 FRAX 7.5% / 0.8%   Pancreatitis     Allergies Allergies  Allergen Reactions   Ciprofloxin Hcl [Ciprofloxacin] Other (See Comments)    Unstable gait   Morphine And  Codeine Anaphylaxis and Other (See Comments)    Tolerates hydrocodone    Asa [Aspirin] Hives and Other (See Comments)    Hives can become SEVERE, requiring intervention   Chia Oil Swelling and Other (See Comments)    Possibly "chia tea" = Face and lips became swollen   Erythromycin Other (See Comments)    Reaction not recalled   Nsaids Other (See Comments)    Lowers GFR   Penicillins Other (See Comments)    Reaction not recalled- was told to "never take this again" by family    IV Location/Drains/Wounds Patient Lines/Drains/Airways Status     Active Line/Drains/Airways     Name Placement date Placement time Site Days   Peripheral IV 06/19/23 20 G Anterior;Right Forearm 06/19/23  0231  Forearm  less than 1   Wound / Incision (Open or Dehisced) 04/09/22 Non-pressure wound Pretibial Right;Left 04/09/22  1051  Pretibial  436            Labs/Imaging Results for orders placed or performed during the hospital encounter of 06/18/23 (from the past 48 hours)  Basic metabolic panel     Status: Abnormal   Collection Time: 06/18/23 12:13 AM  Result Value Ref Range   Sodium 141 135 - 145 mmol/L   Potassium 4.2 3.5 - 5.1 mmol/L   Chloride  103 98 - 111 mmol/L   CO2 28 22 - 32 mmol/L   Glucose, Bld 134 (H) 70 - 99 mg/dL    Comment: Glucose reference range applies only to samples taken after fasting for at least 8 hours.   BUN 24 (H) 8 - 23 mg/dL   Creatinine, Ser 9.81 0.44 - 1.00 mg/dL   Calcium 9.0 8.9 - 19.1 mg/dL   GFR, Estimated >47 >82 mL/min    Comment: (NOTE) Calculated using the CKD-EPI Creatinine Equation (2021)    Anion gap 10 5 - 15    Comment: Performed at [facility], [address] [place], [place] [address]  CBC with Differential     Status: Abnormal   Collection Time: 06/18/23 12:13 AM  Result Value Ref Range   WBC 9.8 4.0 - 10.5 K/uL   RBC 3.62 (L) 3.87 - 5.11 MIL/uL   Hemoglobin 11.2 (L) 12.0 - 15.0 g/dL   HCT 30.8 65.7 - 84.6 %   MCV 101.9  (H) 80.0 - 100.0 fL   MCH 30.9 26.0 - 34.0 pg   MCHC 30.4 30.0 - 36.0 g/dL   RDW 96.2 (H) 95.2 - 84.1 %   Platelets 278 150 - 400 K/uL   nRBC 0.0 0.0 - 0.2 %   Neutrophils Relative % 70 %   Neutro Abs 6.9 1.7 - 7.7 K/uL   Lymphocytes Relative 16 %   Lymphs Abs 1.5 0.7 - 4.0 K/uL   Monocytes Relative 9 %   Monocytes Absolute 0.9 0.1 - 1.0 K/uL   Eosinophils Relative 3 %   Eosinophils Absolute 0.3 0.0 - 0.5 K/uL   Basophils Relative 1 %   Basophils Absolute 0.1 0.0 - 0.1 K/uL   Immature Granulocytes 1 %   Abs Immature Granulocytes 0.06 0.00 - 0.07 K/uL    Comment: Performed at [facility], [address], [place], [place] [address]  Brain natriuretic peptide     Status: Abnormal   Collection Time: 06/18/23 12:13 AM  Result Value Ref Range   B Natriuretic Peptide 367.1 (H) 0.0 - 100.0 pg/mL    Comment: Performed at [facility], [address] [place], [place] [address]  Hemoglobin A1c     Status: Abnormal   Collection Time: 06/19/23  4:48 AM  Result Value Ref Range   Hgb A1c MFr Bld 5.9 (H) 4.8 - 5.6 %    Comment: (NOTE) Pre diabetes:          5.7%-6.4%  Diabetes:              >6.4%  Glycemic control for   <7.0% adults with diabetes    Mean Plasma Glucose 122.63 mg/dL    Comment: Performed at [facility], [address], [place], [place] [address]  Comprehensive metabolic panel     Status: Abnormal   Collection Time: 06/19/23  4:49 AM  Result Value Ref Range   Sodium 141 135 - 145 mmol/L   Potassium 4.1 3.5 - 5.1 mmol/L   Chloride 102 98 - 111 mmol/L   CO2 28 22 - 32 mmol/L   Glucose, Bld 129 (H) 70 - 99 mg/dL    Comment: Glucose reference range applies only to samples taken after fasting for at least 8 hours.   BUN 22 8 - 23 mg/dL   Creatinine, Ser 4.03 0.44 - 1.00 mg/dL   Calcium 9.0 8.9 - 47.4 mg/dL   Total Protein 7.0 6.5 - 8.1 g/dL  Albumin  3.4 (L) 3.5 - 5.0 g/dL   AST 14 (L) 15 - 41 U/L   ALT 12 0 - 44 U/L    Alkaline Phosphatase 117 38 - 126 U/L   Total Bilirubin 1.0 0.0 - 1.2 mg/dL   GFR, Estimated >16 >10 mL/min    Comment: (NOTE) Calculated using the CKD-EPI Creatinine Equation (2021)    Anion gap 11 5 - 15    Comment: Performed at [facility], [address], [place], [place] [address]   DG Chest 2 View Result Date: 06/18/2023 CLINICAL DATA:  Fluid overload EXAM: CHEST - 2 VIEW COMPARISON:  None Available. FINDINGS: Lungs are clear. No pneumothorax. Small bilateral pleural effusions are present. Cardiac size is enlarged unchanged. Pulmonary vascularity is. Osseous structures are age-appropriate. No acute bone abnormality. IMPRESSION: 1. Small bilateral pleural effusions. 2. Stable cardiomegaly. Electronically Signed   By: Worthy Heads M.D.   On: 06/18/2023 23:48    Pending Labs Unresulted Labs (From admission, onward)     Start     Ordered   06/20/23 0500  Basic metabolic panel with GFR  Daily,   R      06/19/23 0829            Vitals/Pain Today's Vitals   06/19/23 0124 06/19/23 0510 06/19/23 0840 06/19/23 0847  BP: (!) 160/76 (!) 145/85 (!) 141/75 (!) 141/75  Pulse: 84 89 90 90  Resp: 18 19  20   Temp: 98 F (36.7 C) 98.3 F (36.8 C)  98 F (36.7 C)  TempSrc: Oral Oral  Oral  SpO2: 96% 95%  95%  PainSc:        Isolation Precautions No active isolations  Medications Medications  enoxaparin  (LOVENOX ) injection 60 mg (60 mg Subcutaneous Given 06/19/23 0956)  acetaminophen  (TYLENOL ) tablet 650 mg (has no administration in time range)    Or  acetaminophen  (TYLENOL ) suppository 650 mg (has no administration in time range)  furosemide  (LASIX ) injection 40 mg (40 mg Intravenous Given 06/19/23 0840)  metoprolol  tartrate (LOPRESSOR ) tablet 50 mg (50 mg Oral Given 06/19/23 0840)  vancomycin  (VANCOREADY) IVPB 1750 mg/350 mL (has no administration in time range)  polyethylene glycol (MIRALAX  / GLYCOLAX ) packet 17 g (0 g Oral Hold 06/19/23 1005)  hydrOXYzine   (ATARAX ) tablet 25 mg (has no administration in time range)  potassium chloride  SA (KLOR-CON  M) CR tablet 20 mEq (has no administration in time range)  cholecalciferol  (VITAMIN D3) 25 MCG (1000 UNIT) tablet 1,000 Units (1,000 Units Oral Given 06/19/23 0955)  ondansetron  (ZOFRAN ) injection 4 mg (has no administration in time range)  furosemide  (LASIX ) injection 40 mg (40 mg Intravenous Given 06/19/23 0234)  vancomycin  (VANCOREADY) IVPB 2000 mg/400 mL (0 mg Intravenous Stopped 06/19/23 0438)  hydrOXYzine  (ATARAX ) tablet 10 mg (10 mg Oral Given 06/19/23 0420)    Mobility non-ambulatory

## 2023-06-19 NOTE — Progress Notes (Signed)
 [facility] PROGRESS NOTE    Progress Note  Allison Short  YNW:[id number] DOB: [birth year] DOA: 06/18/2023 PCP: Alto Atta, NP     Brief Narrative:   Allison Short is an 73 y.o. female past medical history significant for essential hypertension, morbid obesity, obstructive sleep apnea/obesity hypoventilation syndrome on CPAP, chronic hypoxic and hypercapnic respiratory failure, non-Hodgkin's lymphoma in remission chronic lymphedema B12 deficiency comes in with bilateral lower extremity edema, she normally uses a lymphedema pump but she was unable to get it on her legs due to her significant swelling.  Concerned that her legs are so swollen they are continuously seeping.  Difficult for her to walk or even take a few steps she is on Lasix  daily reports exertional dyspnea.  Chest x-ray showed bilateral pleural effusion she was started on IV Lasix  and vancomycin  in the ED   Assessment/Plan:   Acute on chronic heart failure with preserved ejection fraction (HFpEF) (HCC) Last 2D echo March 2020 fortunately for 60%, BNP on this admission was 367 chest x-ray showed bilateral small pleural effusion. She was started on IV Lasix , low-sodium diet and fluid restriction. Continue strict I's and O's and daily weights. She is -2.2 L. Continue Lasix  IV twice daily. Consult PT OT, anticipate she will need skilled nursing facility. Patient is a bit mostly bedbound for the last week  Cellulitis of lower extremity with chronic  lymphedema: She is afebrile with no leukocytosis no signs of sepsis she was started empirically on IV vancomycin . Consult wound care.  Essential hypertension Resume current home meds.  OSA (obstructive sleep apnea) Continue nightly CPAP.  Prediabetes Last A1c of 5.8 in July 2024 currently repeating.    DVT prophylaxis: lovenox  Family Communication:none Status is: Inpatient Remains inpatient appropriate because: Possible cellulitis with acute heart failure    Code  Status:     Code Status Orders  (From admission, onward)           Start     Ordered   06/19/23 0302  Full code  Continuous       Question:  By:  Answer:  Consent: discussion documented in EHR   06/19/23 0306           Code Status History     Date Active Date Inactive Code Status Order ID Comments User Context   04/08/2022 1955 04/29/2022 1708 Full Code [id number]  Raymona Caldwell, MD ED   08/20/2020 2242 08/26/2020 2218 Full Code [id number]  Chotiner, Pauline Bos, MD Inpatient         IV Access:   Peripheral IV   Procedures and diagnostic studies:   DG Chest 2 View Result Date: 06/18/2023 CLINICAL DATA:  Fluid overload EXAM: CHEST - 2 VIEW COMPARISON:  None Available. FINDINGS: Lungs are clear. No pneumothorax. Small bilateral pleural effusions are present. Cardiac size is enlarged unchanged. Pulmonary vascularity is. Osseous structures are age-appropriate. No acute bone abnormality. IMPRESSION: 1. Small bilateral pleural effusions. 2. Stable cardiomegaly. Electronically Signed   By: Worthy Heads M.D.   On: 06/18/2023 23:48     Medical Consultants:   None.   Subjective:    Allison Short no complaints  Objective:    Vitals:   06/18/23 2214 06/19/23 0124 06/19/23 0510  BP: (!) 176/91 (!) 160/76 (!) 145/85  Pulse: 87 84 89  Resp: 18 18 19   Temp: 98.1 F (36.7 C) 98 F (36.7 C) 98.3 F (36.8 C)  TempSrc:  Oral Oral  SpO2: 96% 96% 95%   SpO2: 95 %  Intake/Output Summary (Last 24 hours) at 06/19/2023 0825 Last data filed at 06/19/2023 0754 Gross per 24 hour  Intake 400 ml  Output 2600 ml  Net -2200 ml   There were no vitals filed for this visit.  Exam: General exam: In no acute distress. Respiratory system: Good air movement and clear to auscultation. Cardiovascular system: S1 & S2 heard, RRR.  Gastrointestinal system: Abdomen is nondistended, soft and nontender.  Central nervous system: Alert and oriented. No focal neurological  deficits. Extremities: Massive lymphedema, erythematous warm to touch with an open wound on the left foot        Skin: No rashes, lesions or ulcers Psychiatry: Judgement and insight appear normal. Mood & affect appropriate.    Data Reviewed:    Labs: Basic Metabolic Panel: Recent Labs  Lab 06/18/23 0013 06/19/23 0449  NA 141 141  K 4.2 4.1  CL 103 102  CO2 28 28  GLUCOSE 134* 129*  BUN 24* 22  CREATININE 0.92 0.92  CALCIUM 9.0 9.0   GFR CrCl cannot be calculated (Unknown ideal weight.). Liver Function Tests: Recent Labs  Lab 06/19/23 0449  AST 14*  ALT 12  ALKPHOS 117  BILITOT 1.0  PROT 7.0  ALBUMIN  3.4*   No results for input(s): "LIPASE", "AMYLASE" in the last 168 hours. No results for input(s): "AMMONIA" in the last 168 hours. Coagulation profile No results for input(s): "INR", "PROTIME" in the last 168 hours. COVID-19 Labs  No results for input(s): "DDIMER", "FERRITIN", "LDH", "CRP" in the last 72 hours.  Lab Results  Component Value Date   SARSCOV2NAA NEGATIVE 08/20/2020    CBC: Recent Labs  Lab 06/18/23 0013  WBC 9.8  NEUTROABS 6.9  HGB 11.2*  HCT 36.9  MCV 101.9*  PLT 278   Cardiac Enzymes: No results for input(s): "CKTOTAL", "CKMB", "CKMBINDEX", "TROPONINI" in the last 168 hours. BNP (last 3 results) No results for input(s): "PROBNP" in the last 8760 hours. CBG: No results for input(s): "GLUCAP" in the last 168 hours. D-Dimer: No results for input(s): "DDIMER" in the last 72 hours. Hgb A1c: Recent Labs    06/19/23 0448  HGBA1C 5.9*   Lipid Profile: No results for input(s): "CHOL", "HDL", "LDLCALC", "TRIG", "CHOLHDL", "LDLDIRECT" in the last 72 hours. Thyroid  function studies: No results for input(s): "TSH", "T4TOTAL", "T3FREE", "THYROIDAB" in the last 72 hours.  Invalid input(s): "FREET3" Anemia work up: No results for input(s): "VITAMINB12", "FOLATE", "FERRITIN", "TIBC", "IRON", "RETICCTPCT" in the last 72  hours. Sepsis Labs: Recent Labs  Lab 06/18/23 0013  WBC 9.8   Microbiology No results found for this or any previous visit (from the past 240 hours).   Medications:    enoxaparin  (LOVENOX ) injection  60 mg Subcutaneous Q24H   furosemide   40 mg Intravenous BID   metoprolol  tartrate  50 mg Oral BID   Continuous Infusions:  vancomycin         LOS: 0 days   Macdonald Savoy  [facility]  06/19/2023, 8:25 AM

## 2023-06-20 DIAGNOSIS — L03115 Cellulitis of right lower limb: Secondary | ICD-10-CM | POA: Diagnosis not present

## 2023-06-20 DIAGNOSIS — I890 Lymphedema, not elsewhere classified: Secondary | ICD-10-CM | POA: Diagnosis not present

## 2023-06-20 DIAGNOSIS — I5033 Acute on chronic diastolic (congestive) heart failure: Secondary | ICD-10-CM | POA: Diagnosis not present

## 2023-06-20 LAB — BASIC METABOLIC PANEL WITH GFR
Anion gap: 8 (ref 5–15)
BUN: 23 mg/dL (ref 8–23)
CO2: 33 mmol/L — ABNORMAL HIGH (ref 22–32)
Calcium: 8.5 mg/dL — ABNORMAL LOW (ref 8.9–10.3)
Chloride: 99 mmol/L (ref 98–111)
Creatinine, Ser: 1.07 mg/dL — ABNORMAL HIGH (ref 0.44–1.00)
GFR, Estimated: 55 mL/min — ABNORMAL LOW (ref >60)
Glucose, Bld: 146 mg/dL — ABNORMAL HIGH (ref 70–99)
Potassium: 3.5 mmol/L (ref 3.5–5.1)
Sodium: 140 mmol/L (ref 135–145)

## 2023-06-20 MED ORDER — DOXYCYCLINE HYCLATE 100 MG PO TABS
100.0000 mg | ORAL_TABLET | Freq: Two times a day (BID) | ORAL | Status: DC
  Administered 2023-06-20 – 2023-06-27 (×15): 100 mg via ORAL
  Filled 2023-06-20 (×15): qty 1

## 2023-06-20 MED ORDER — ENOXAPARIN SODIUM 80 MG/0.8ML IJ SOSY
80.0000 mg | PREFILLED_SYRINGE | INTRAMUSCULAR | Status: DC
  Administered 2023-06-21 – 2023-06-23 (×3): 80 mg via SUBCUTANEOUS
  Filled 2023-06-20 (×4): qty 0.8

## 2023-06-20 MED ORDER — POTASSIUM CHLORIDE CRYS ER 20 MEQ PO TBCR
20.0000 meq | EXTENDED_RELEASE_TABLET | Freq: Two times a day (BID) | ORAL | Status: DC
  Administered 2023-06-20 – 2023-06-27 (×15): 20 meq via ORAL
  Filled 2023-06-20 (×15): qty 1

## 2023-06-20 MED ORDER — FUROSEMIDE 20 MG PO TABS
20.0000 mg | ORAL_TABLET | Freq: Two times a day (BID) | ORAL | Status: DC
  Administered 2023-06-20 – 2023-06-21 (×2): 20 mg via ORAL
  Filled 2023-06-20 (×2): qty 1

## 2023-06-20 NOTE — Evaluation (Signed)
 Occupational Therapy Evaluation Patient Details Name: Allison Short MRN: [id number] DOB: 1951-02-03 Today's Date: 06/20/2023   History of Present Illness   73 yr old female admitted with acute on chronic HF, LE celluliltis. PMH: lymphedema, chronic LE edema, CHF, OSA, obesity, B TKA     Clinical Impressions The pt is currently presenting significantly below her baseline level of functioning for self-care management, as she is limited by the below listed deficits (see OT problem list). She reported a history of B LE edema, heaviness, and pain, due to chronic lymphedema. She also reported feelings of generalized and deconditioning. She gently declined attempts at out of bed activity today. She will benefit from further OT services to maximize her independence with self care tasks and to decrease the risk for further weakness an deconditioning. Patient will benefit from continued inpatient follow up therapy, <3 hours/day.      If plan is discharge home, recommend the following:   A lot of help with walking and/or transfers;A lot of help with bathing/dressing/bathroom;Assistance with cooking/housework;Assist for transportation     Functional Status Assessment   Patient has had a recent decline in their functional status and demonstrates the ability to make significant improvements in function in a reasonable and predictable amount of time.     Equipment Recommendations   Other (comment) (to be determined)     Recommendations for Other Services         Precautions/Restrictions   Precautions Precautions: Fall Restrictions Weight Bearing Restrictions Per Provider Order: No     Mobility Bed Mobility               General bed mobility comments: The pt attempted to roll left and right in bed, however she was unable to achieve, due to deconditioning, weakness, and B LE         General transfer comment:  (pt gently deferred out of bed attempts, reporting increased  fatigue)          ADL either performed or assessed with clinical judgement   ADL Overall ADL's : Needs assistance/impaired Eating/Feeding: Independent   Grooming: Set up;Bed level           Upper Body Dressing : Minimal assistance;Bed level   Lower Body Dressing: Maximal assistance;Bed level       Toileting- Clothing Manipulation and Hygiene: Maximal assistance;Bed level Toileting - Clothing Manipulation Details (indicate cue type and reason): based on clinical judgement             Vision Baseline Vision/History: 1 Wears glasses Additional Comments: She correctly read the time depicted on the wall clock            Pertinent Vitals/Pain Pain Assessment Pain Assessment: No/denies pain     Extremity/Trunk Assessment Upper Extremity Assessment Upper Extremity Assessment: Overall WFL for tasks assessed   Lower Extremity Assessment Lower Extremity Assessment: RLE deficits/detail;LLE deficits/detail;Generalized weakness RLE Deficits / Details: ROM decreased due to edema from chronic lymphedema LLE Deficits / Details: ROM decreased due to edema from chronic lymphedema     Communication Communication Communication: No apparent difficulties   Cognition Arousal: Alert Behavior During Therapy: WFL for tasks assessed/performed          Following commands: Intact                  Home Living Family/patient expects to be discharged to:: Private residence Living Arrangements: Alone   Type of Home: Apartment Home Access: Level entry     Home Layout: One  level     Bathroom Shower/Tub: Chief Strategy Officer: Handicapped height     Home Equipment: Rollator (4 wheels);Tub bench   Additional Comments: reacher, leg lifter      Prior Functioning/Environment    Mobility Comments: uses rollator ADLs Comments:  (Used a public assistance program for transportation, had hired assist for cleaning ~every 6 weeks, and was modified independent  to independent with ADLs.)  She reported having no support locally, as she is originally from [place] and her only son passed away in 16-Oct-2023.     OT Problem List: Decreased strength;Decreased range of motion;Decreased activity tolerance;Impaired balance (sitting and/or standing);Increased edema   OT Treatment/Interventions: Self-care/ADL training;Therapeutic exercise;Therapeutic activities;Energy conservation;Patient/family education;DME and/or AE instruction;Balance training      OT Goals(Current goals can be found in the care plan section)   Acute Rehab OT Goals Patient Stated Goal: to get better and return home at discharge OT Goal Formulation: With patient Time For Goal Achievement: 07/04/23 Potential to Achieve Goals: Good ADL Goals Pt Will Perform Upper Body Dressing: with set-up;sitting Pt Will Perform Lower Body Dressing: with supervision;with set-up;with adaptive equipment;sitting/lateral leans;sit to/from stand Pt Will Transfer to Toilet: with supervision;ambulating Pt Will Perform Toileting - Clothing Manipulation and hygiene: with supervision;sit to/from stand   OT Frequency:  Min 2X/week    AM-PAC OT "6 Clicks" Daily Activity     Outcome Measure Help from another person eating meals?: None Help from another person taking care of personal grooming?: A Little Help from another person toileting, which includes using toliet, bedpan, or urinal?: A Lot Help from another person bathing (including washing, rinsing, drying)?: A Lot Help from another person to put on and taking off regular upper body clothing?: A Little Help from another person to put on and taking off regular lower body clothing?: A Lot 6 Click Score: 16   End of Session Equipment Utilized During Treatment: Oxygen Nurse Communication: Mobility status  Activity Tolerance: Patient limited by fatigue Patient left: in bed;with call bell/phone within reach;with bed alarm set  OT Visit Diagnosis: Muscle weakness  (generalized) (M62.81);Unsteadiness on feet (R26.81)                Time: 6045-4098 OT Time Calculation (min): 42 min Charges:  OT General Charges $OT Visit: 1 Visit OT Evaluation $OT Eval Moderate Complexity: 1 Mod    Nathian Stencil L Reis Pienta, OTR/L 06/20/2023, 4:43 PM

## 2023-06-20 NOTE — Progress Notes (Addendum)
 [facility] PROGRESS NOTE    Progress Note  Allison Short  WUJ:[id number] DOB: 17-Aug-1950 DOA: 06/18/2023 PCP: Alto Atta, NP     Brief Narrative:   Allison Short is an 73 y.o. female past medical history significant for essential hypertension, morbid obesity, obstructive sleep apnea/obesity hypoventilation syndrome on CPAP, chronic hypoxic and hypercapnic respiratory failure, non-Hodgkin's lymphoma in remission chronic lymphedema B12 deficiency comes in with bilateral lower extremity edema, she normally uses a lymphedema pump but she was unable to get it on her legs due to her significant swelling.  Concerned that her legs are so swollen they are continuously seeping.  Difficult for her to walk or even take a few steps she is on Lasix  daily reports exertional dyspnea.  Chest x-ray showed bilateral pleural effusion she was started on IV Lasix  and vancomycin  in the ED   Assessment/Plan:   Acute on chronic heart failure with preserved ejection fraction (HFpEF) (HCC) Last 2D echo March 2020 fortunately for 60%, BNP on this admission was 367 chest x-ray showed bilateral small pleural effusion. Started on IV Lasix  she is negative about 6 L. Patient to oral Lasix .Continue strict I's and O's and daily weights. PT OT eval is pending, anticipate she will need skilled nursing facility. Patient is mostly bedbound for the last week. Out of bed to chair, weaned to room air.  Needing above 88% is good on her.  Cellulitis of lower extremity with chronic  lymphedema with open wound in the dorsum: She is afebrile with no leukocytosis no signs of sepsis. Transition to oral Doxy. Wound care was consulted recommended to apply silver Hydrofiber to legs and cover with ABD pads and Kerlix. Light Ace bandages Follow-up with medical lymphedema clinic as an outpatient.  Essential hypertension Resume current home meds.  OSA (obstructive sleep apnea) Continue nightly CPAP. Overnight she desatted likely  due to obstructive sleep apnea.  Prediabetes Last A1c of 5.8 in July 2024 currently repeating.  Morbid obesity: Noted.    DVT prophylaxis: lovenox  Family Communication:none Status is: Inpatient Remains inpatient appropriate because: Possible cellulitis with acute heart failure    Code Status:     Code Status Orders  (From admission, onward)           Start     Ordered   06/19/23 0302  Full code  Continuous       Question:  By:  Answer:  Consent: discussion documented in EHR   06/19/23 0306           Code Status History     Date Active Date Inactive Code Status Order ID Comments User Context   04/08/2022 1955 04/29/2022 1708 Full Code [id number]  Raymona Caldwell, MD ED   08/20/2020 2242 08/26/2020 2218 Full Code [id number]  Chotiner, Pauline Bos, MD Inpatient         IV Access:   Peripheral IV   Procedures and diagnostic studies:   ECHOCARDIOGRAM COMPLETE Result Date: 06/19/2023    ECHOCARDIOGRAM REPORT   Patient Name:   Allison Short Date of Exam: 06/19/2023 Medical Rec #:  [id number]  Height:       68.0 in Accession #:    [id number] Weight:       374.6 lb Date of Birth:  1950-03-04   BSA:          2.668 m Patient Age:    72 years   BP:           153/70 mmHg Patient Gender: F  HR:           84 bpm. Exam Location:  Inpatient Procedure: 2D Echo, Cardiac Doppler, Color Doppler and Intracardiac            Opacification Agent (Both Spectral and Color Flow Doppler were            utilized during procedure). Indications:    CHF-acute diastolic  History:        Patient has prior history of Echocardiogram examinations, most                 recent 04/28/2022. Risk Factors:Hypertension and Sleep Apnea.  Sonographer:    Juanita Shaw Referring Phys: [id number] JYNWGNFAO RATHORE  Sonographer Comments: Technically difficult study due to poor echo windows, suboptimal apical window, suboptimal parasternal window and suboptimal subcostal window. Image acquisition challenging due to patient  body habitus. IMPRESSIONS  1. Left ventricular ejection fraction, by estimation, is 50 to 55%. The left ventricle has low normal function. The left ventricle demonstrates global hypokinesis. Left ventricular diastolic parameters are indeterminate.  2. Right ventricular systolic function is normal. The right ventricular size is visually enlarged.  3. The mitral valve is degenerative. No evidence of mitral valve regurgitation. No evidence of mitral stenosis.  4. The aortic valve is grossly normal. Aortic valve regurgitation is not visualized. Aortic valve sclerosis is present, with no evidence of aortic valve stenosis.  5. The inferior vena cava is dilated in size with <50% respiratory variability, suggesting right atrial pressure of 15 mmHg. FINDINGS  Left Ventricle: Left ventricular ejection fraction, by estimation, is 50 to 55%. The left ventricle has low normal function. The left ventricle demonstrates global hypokinesis. The left ventricular internal cavity size was normal in size. There is no left ventricular hypertrophy. Left ventricular diastolic parameters are indeterminate. Right Ventricle: The right ventricular size is visually enlarged. No increase in right ventricular wall thickness. Right ventricular systolic function is normal. Left Atrium: Left atrial size was normal in size. Right Atrium: Right atrial size was normal in size. Pericardium: There is no evidence of pericardial effusion. Mitral Valve: The mitral valve is degenerative in appearance. No evidence of mitral valve regurgitation. No evidence of mitral valve stenosis. MV peak gradient, 4.2 mmHg. The mean mitral valve gradient is 2.0 mmHg. Tricuspid Valve: The tricuspid valve is normal in structure. Tricuspid valve regurgitation is trivial. No evidence of tricuspid stenosis. Aortic Valve: The aortic valve is grossly normal. Aortic valve regurgitation is not visualized. Aortic valve sclerosis is present, with no evidence of aortic valve stenosis.  Aortic valve mean gradient measures 7.0 mmHg. Aortic valve peak gradient measures 11.7 mmHg. Aortic valve area, by VTI measures 2.08 cm. Pulmonic Valve: The pulmonic valve was not well visualized. Pulmonic valve regurgitation is trivial. No evidence of pulmonic stenosis. Aorta: The aortic root and ascending aorta are structurally normal, with no evidence of dilitation. Venous: The inferior vena cava is dilated in size with less than 50% respiratory variability, suggesting right atrial pressure of 15 mmHg. IAS/Shunts: The interatrial septum was not well visualized.  LEFT VENTRICLE PLAX 2D LVIDd:         5.20 cm      Diastology LVIDs:         3.60 cm      LV e' medial:    5.77 cm/s LV PW:         0.90 cm      LV E/e' medial:  15.9 LV IVS:  0.90 cm      LV e' lateral:   9.46 cm/s LVOT diam:     2.20 cm      LV E/e' lateral: 9.7 LV SV:         70 LV SV Index:   26 LVOT Area:     3.80 cm  LV Volumes (MOD) LV vol d, MOD A4C: 154.0 ml LV vol s, MOD A4C: 73.3 ml LV SV MOD A4C:     154.0 ml RIGHT VENTRICLE             IVC RV S prime:     11.10 cm/s  IVC diam: 2.30 cm TAPSE (M-mode): 2.5 cm LEFT ATRIUM             Index        RIGHT ATRIUM           Index LA diam:        4.30 cm 1.61 cm/m   RA Area:     14.40 cm LA Vol (A2C):   86.0 ml 32.23 ml/m  RA Volume:   35.10 ml  13.15 ml/m LA Vol (A4C):   67.0 ml 25.11 ml/m LA Biplane Vol: 79.2 ml 29.68 ml/m  AORTIC VALVE                     PULMONIC VALVE AV Area (Vmax):    2.02 cm      PV Vmax:       0.98 m/s AV Area (Vmean):   2.03 cm      PV Peak grad:  3.9 mmHg AV Area (VTI):     2.08 cm AV Vmax:           171.00 cm/s AV Vmean:          121.000 cm/s AV VTI:            0.335 m AV Peak Grad:      11.7 mmHg AV Mean Grad:      7.0 mmHg LVOT Vmax:         90.80 cm/s LVOT Vmean:        64.700 cm/s LVOT VTI:          0.183 m LVOT/AV VTI ratio: 0.55  AORTA Ao Root diam: 2.80 cm Ao Asc diam:  3.10 cm MITRAL VALVE MV Area (PHT): 3.93 cm    SHUNTS MV Area VTI:   3.00 cm     Systemic VTI:  0.18 m MV Peak grad:  4.2 mmHg    Systemic Diam: 2.20 cm MV Mean grad:  2.0 mmHg MV Vmax:       1.03 m/s MV Vmean:      64.8 cm/s MV E velocity: 91.90 cm/s MV A velocity: 70.00 cm/s MV E/A ratio:  1.31 Sunit Tolia Electronically signed by Olinda Bertrand Signature Date/Time: 06/19/2023/3:33:21 PM    Final    DG Chest 2 View Result Date: 06/18/2023 CLINICAL DATA:  Fluid overload EXAM: CHEST - 2 VIEW COMPARISON:  None Available. FINDINGS: Lungs are clear. No pneumothorax. Small bilateral pleural effusions are present. Cardiac size is enlarged unchanged. Pulmonary vascularity is. Osseous structures are age-appropriate. No acute bone abnormality. IMPRESSION: 1. Small bilateral pleural effusions. 2. Stable cardiomegaly. Electronically Signed   By: Worthy Heads M.D.   On: 06/18/2023 23:48     Medical Consultants:   None.   Subjective:    Allison Short relates she feels better overall.  Objective:    Vitals:  06/19/23 1951 06/20/23 0020 06/20/23 0400 06/20/23 0458  BP: (!) 145/58 119/60  (!) 116/59  Pulse: 90 77  78  Resp: 18 20  20   Temp: 98.2 F (36.8 C) 98.9 F (37.2 C)  98.3 F (36.8 C)  TempSrc: Oral Oral  Oral  SpO2: 96% 99%  100%  Weight:   (!) 165.9 kg   Height:       SpO2: 100 % O2 Flow Rate (L/min): 4 L/min   Intake/Output Summary (Last 24 hours) at 06/20/2023 0929 Last data filed at 06/20/2023 0815 Gross per 24 hour  Intake 328.32 ml  Output 4250 ml  Net -3921.68 ml   Filed Weights   06/19/23 1240 06/20/23 0400  Weight: (!) 169.9 kg (!) 165.9 kg    Exam: General exam: In no acute distress. Respiratory system: Good air movement and clear to auscultation. Cardiovascular system: S1 & S2 heard, RRR. No JVD. Gastrointestinal system: Abdomen is nondistended, soft and nontender.  Central nervous system: Alert and oriented. No focal neurological deficits. Psychiatry: Judgement and insight appear normal. Mood & affect  appropriate. Skin:        Skin: No rashes, lesions or ulcers Psychiatry: Judgement and insight appear normal. Mood & affect appropriate.    Data Reviewed:    Labs: Basic Metabolic Panel: Recent Labs  Lab 06/18/23 0013 06/19/23 0449 06/20/23 0558  NA 141 141 140  K 4.2 4.1 3.5  CL 103 102 99  CO2 28 28 33*  GLUCOSE 134* 129* 146*  BUN 24* 22 23  CREATININE 0.92 0.92 1.07*  CALCIUM 9.0 9.0 8.5*   GFR Estimated Creatinine Clearance: 76.2 mL/min (A) (by C-G formula based on SCr of 1.07 mg/dL (H)). Liver Function Tests: Recent Labs  Lab 06/19/23 0449  AST 14*  ALT 12  ALKPHOS 117  BILITOT 1.0  PROT 7.0  ALBUMIN  3.4*   No results for input(s): "LIPASE", "AMYLASE" in the last 168 hours. No results for input(s): "AMMONIA" in the last 168 hours. Coagulation profile No results for input(s): "INR", "PROTIME" in the last 168 hours. COVID-19 Labs  No results for input(s): "DDIMER", "FERRITIN", "LDH", "CRP" in the last 72 hours.  Lab Results  Component Value Date   SARSCOV2NAA NEGATIVE 08/20/2020    CBC: Recent Labs  Lab 06/18/23 0013  WBC 9.8  NEUTROABS 6.9  HGB 11.2*  HCT 36.9  MCV 101.9*  PLT 278   Cardiac Enzymes: No results for input(s): "CKTOTAL", "CKMB", "CKMBINDEX", "TROPONINI" in the last 168 hours. BNP (last 3 results) No results for input(s): "PROBNP" in the last 8760 hours. CBG: No results for input(s): "GLUCAP" in the last 168 hours. D-Dimer: No results for input(s): "DDIMER" in the last 72 hours. Hgb A1c: Recent Labs    06/19/23 0448  HGBA1C 5.9*   Lipid Profile: No results for input(s): "CHOL", "HDL", "LDLCALC", "TRIG", "CHOLHDL", "LDLDIRECT" in the last 72 hours. Thyroid  function studies: No results for input(s): "TSH", "T4TOTAL", "T3FREE", "THYROIDAB" in the last 72 hours.  Invalid input(s): "FREET3" Anemia work up: No results for input(s): "VITAMINB12", "FOLATE", "FERRITIN", "TIBC", "IRON", "RETICCTPCT" in the last 72  hours. Sepsis Labs: Recent Labs  Lab 06/18/23 0013  WBC 9.8   Microbiology No results found for this or any previous visit (from the past 240 hours).   Medications:    cholecalciferol   1,000 Units Oral Daily   enoxaparin  (LOVENOX ) injection  60 mg Subcutaneous Q24H   furosemide   40 mg Intravenous BID   metoprolol  tartrate  50 mg  Oral BID   polyethylene glycol  17 g Oral BID   potassium chloride   20 mEq Oral Daily   Continuous Infusions:  vancomycin  1,750 mg (06/19/23 2107)      LOS: 1 day   Macdonald Savoy  [facility]  06/20/2023, 9:29 AM

## 2023-06-20 NOTE — Progress Notes (Signed)
 NIV Resmed placed on Pt with Nasal mask auto-pap setting with 3L inline Pt  did not tolerate and request to remove mask. NIV  Unit remain in room at bedside. Pt states she's more comfortable with her home unit but does not have anyone to bring it in.

## 2023-06-20 NOTE — Plan of Care (Signed)

## 2023-06-20 NOTE — Evaluation (Signed)
 Physical Therapy Evaluation Patient Details Name: Allison Short MRN: [id number] DOB: 12/31/1950 Today's Date: 06/20/2023  History of Present Illness  73 yo female admitted with acute on chronic HF, LE celluliltis. Hx of lymphedema, chronic LE edema, CHF, OSA, morbid obesity, NHL, B TKA  Clinical Impression  On eval, pt required Mod A +2 for mobility. She was able to stand x 2 at bedside with RW. She was also able to take lateral steps towards the [facility] with RW use. Pt presents with general weakness, decreased activity tolerance, and impaired gait/balance. O2 >90% on RA with activity. Pt tends to get a bit anxious with mobilizing-fearful of falling. Discussed d/c plan-pt is hopeful to be able to return home however she is currently not at a level where she can safely manage her care at home alone. Patient will benefit from continued inpatient follow up therapy, <3 hours/day.         If plan is discharge home, recommend the following: A lot of help with walking and/or transfers;A lot of help with bathing/dressing/bathroom;Assistance with cooking/housework;Assist for transportation;Help with stairs or ramp for entrance   Can travel by private vehicle        Equipment Recommendations None recommended by PT  Recommendations for Other Services       Functional Status Assessment Patient has had a recent decline in their functional status and demonstrates the ability to make significant improvements in function in a reasonable and predictable amount of time.     Precautions / Restrictions Precautions Precautions: Fall Restrictions Weight Bearing Restrictions Per Provider Order: No      Mobility  Bed Mobility Overal bed mobility: Needs Assistance Bed Mobility: Supine to Sit, Sit to Supine     Supine to sit: Mod assist, +2 for physical assistance, +2 for safety/equipment, HOB elevated, Used rails Sit to supine: Mod assist, +2 for physical assistance, +2 for safety/equipment, Used rails    General bed mobility comments: Increased time. Assist for trunk and bil LEs. Cues provided + encouragement for pt.    Transfers Overall transfer level: Needs assistance Equipment used: Rolling walker (2 wheels) Transfers: Sit to/from Stand Sit to Stand: Min assist, +2 physical assistance, +2 safety/equipment, From elevated surface           General transfer comment: Assist to stabilize RW, power up, steady, control descent. Cues for safety, feet placement. Sit to stand x 2. Pt c/o bil knee pain.    Ambulation/Gait Ambulation/Gait assistance: Min assist, +2 physical assistance, +2 safety/equipment   Assistive device: Rolling walker (2 wheels)         General Gait Details: Side steps along bedside with use of RW. Cuse for safety, technique. Assist to stabilize pt and manage RW.  Stairs            Wheelchair Mobility     Tilt Bed    Modified Rankin (Stroke Patients Only)       Balance Overall balance assessment: Needs assistance         Standing balance support: Bilateral upper extremity supported, During functional activity, Reliant on assistive device for balance Standing balance-Leahy Scale: Poor                               Pertinent Vitals/Pain Pain Assessment Pain Assessment: Faces Faces Pain Scale: Hurts even more Pain Location: LEs (knees) Pain Descriptors / Indicators: Grimacing, Moaning Pain Intervention(s): Limited activity within patient's tolerance, Monitored during session, Repositioned  Home Living Family/patient expects to be discharged to:: Private residence Living Arrangements: Alone Available Help at Discharge: Family Type of Home: Apartment Home Access: Level entry       Home Layout: One level Home Equipment: Rollator (4 wheels);Tub bench;BSC/3in1      Prior Function Prior Level of Function : Independent/Modified Independent             Mobility Comments: uses rollator ADLs Comments: "access  [place] [facility]" program for transportation     Extremity/Trunk Assessment   Upper Extremity Assessment Upper Extremity Assessment: Defer to OT evaluation    Lower Extremity Assessment Lower Extremity Assessment: Generalized weakness (redness R inner thigh, wraps lower legs)    Cervical / Trunk Assessment Cervical / Trunk Assessment: Normal  Communication   Communication Communication: No apparent difficulties    Cognition Arousal: Alert Behavior During Therapy: WFL for tasks assessed/performed   PT - Cognitive impairments: No apparent impairments                         Following commands: Intact       Cueing Cueing Techniques: Verbal cues     General Comments      Exercises     Assessment/Plan    PT Assessment Patient needs continued PT services  PT Problem List Decreased strength;Decreased range of motion;Decreased activity tolerance;Decreased balance;Decreased mobility;Decreased knowledge of use of DME;Pain       PT Treatment Interventions DME instruction;Gait training;Functional mobility training;Therapeutic activities;Therapeutic exercise;Patient/family education;Balance training    PT Goals (Current goals can be found in the Care Plan section)  Acute Rehab PT Goals Patient Stated Goal: less pain. be able to return home PT Goal Formulation: With patient Time For Goal Achievement: 07/04/23 Potential to Achieve Goals: Good    Frequency Min 2X/week     Co-evaluation               AM-PAC PT "6 Clicks" Mobility  Outcome Measure Help needed turning from your back to your side while in a flat bed without using bedrails?: A Lot Help needed moving from lying on your back to sitting on the side of a flat bed without using bedrails?: A Lot Help needed moving to and from a bed to a chair (including a wheelchair)?: A Lot Help needed standing up from a chair using your arms (e.g., wheelchair or bedside chair)?: A Lot Help needed to walk in hospital  room?: A Lot Help needed climbing 3-5 steps with a railing? : Total 6 Click Score: 11    End of Session Equipment Utilized During Treatment: Gait belt Activity Tolerance: Patient tolerated treatment well;Patient limited by pain;Patient limited by fatigue Patient left: in bed;with call bell/phone within reach;with bed alarm set   PT Visit Diagnosis: Muscle weakness (generalized) (M62.81);Pain;Difficulty in walking, not elsewhere classified (R26.2);History of falling (Z91.81)    Time: 2536-6440 PT Time Calculation (min) (ACUTE ONLY): 37 min   Charges:   PT Evaluation $PT Eval Low Complexity: 1 Low PT Treatments $Therapeutic Activity: 8-22 mins PT General Charges $$ ACUTE PT VISIT: 1 Visit            Tanda Falter, PT Acute Rehabilitation  Office: [phone]

## 2023-06-20 NOTE — Progress Notes (Signed)
 Heart Failure Navigator Progress Note  Assessed for Heart & Vascular TOC clinic readiness.  Patient does not meet criteria due to EF 50-55%, patient admitted with cellulitis of lower extremity with chronic lymphedema. Will follow up with lymphedema clinic as outpatient. Per physical therapy, anticipate she will need SNF at discharge. .   Navigator will sign off at this time.   Randie Bustle, BSN, Scientist, clinical (histocompatibility and immunogenetics) Only

## 2023-06-21 DIAGNOSIS — I890 Lymphedema, not elsewhere classified: Secondary | ICD-10-CM | POA: Diagnosis not present

## 2023-06-21 DIAGNOSIS — I5033 Acute on chronic diastolic (congestive) heart failure: Secondary | ICD-10-CM | POA: Diagnosis not present

## 2023-06-21 DIAGNOSIS — E66813 Obesity, class 3: Secondary | ICD-10-CM | POA: Diagnosis not present

## 2023-06-21 DIAGNOSIS — L03119 Cellulitis of unspecified part of limb: Secondary | ICD-10-CM | POA: Diagnosis not present

## 2023-06-21 LAB — BASIC METABOLIC PANEL WITH GFR
Anion gap: 9 (ref 5–15)
BUN: 23 mg/dL (ref 8–23)
CO2: 29 mmol/L (ref 22–32)
Calcium: 8.2 mg/dL — ABNORMAL LOW (ref 8.9–10.3)
Chloride: 99 mmol/L (ref 98–111)
Creatinine, Ser: 0.93 mg/dL (ref 0.44–1.00)
GFR, Estimated: >60 mL/min (ref >60)
Glucose, Bld: 139 mg/dL — ABNORMAL HIGH (ref 70–99)
Potassium: 3.8 mmol/L (ref 3.5–5.1)
Sodium: 137 mmol/L (ref 135–145)

## 2023-06-21 MED ORDER — HYDROXYZINE HCL 25 MG PO TABS
25.0000 mg | ORAL_TABLET | Freq: Four times a day (QID) | ORAL | Status: DC | PRN
  Administered 2023-06-21 – 2023-06-27 (×7): 25 mg via ORAL
  Filled 2023-06-21 (×9): qty 1

## 2023-06-21 MED ORDER — FUROSEMIDE 10 MG/ML IJ SOLN: 20.0000 mg | Freq: Once | INTRAMUSCULAR | Status: DC

## 2023-06-21 MED ORDER — ALBUTEROL SULFATE (2.5 MG/3ML) 0.083% IN NEBU
2.5000 mg | INHALATION_SOLUTION | Freq: Once | RESPIRATORY_TRACT | Status: AC
  Administered 2023-06-21: 2.5 mg via RESPIRATORY_TRACT
  Filled 2023-06-21: qty 3

## 2023-06-21 MED ORDER — ALBUTEROL SULFATE (2.5 MG/3ML) 0.083% IN NEBU
2.5000 mg | INHALATION_SOLUTION | RESPIRATORY_TRACT | Status: DC | PRN
  Administered 2023-06-21 – 2023-06-22 (×4): 2.5 mg via RESPIRATORY_TRACT
  Filled 2023-06-21 (×4): qty 3

## 2023-06-21 MED ORDER — MENTHOL 3 MG MT LOZG
1.0000 | LOZENGE | OROMUCOSAL | Status: DC | PRN
  Administered 2023-06-21 – 2023-06-22 (×2): 3 mg via ORAL
  Filled 2023-06-21 (×3): qty 9

## 2023-06-21 MED ORDER — FUROSEMIDE 10 MG/ML IJ SOLN
20.0000 mg | Freq: Two times a day (BID) | INTRAMUSCULAR | Status: DC
  Administered 2023-06-21 – 2023-06-22 (×2): 20 mg via INTRAVENOUS
  Filled 2023-06-21 (×2): qty 2

## 2023-06-21 NOTE — Hospital Course (Addendum)
 73 y.o. female past medical history significant for essential hypertension, morbid obesity, obstructive sleep apnea/obesity hypoventilation syndrome on CPAP, chronic hypoxic and hypercapnic respiratory failure, non-Hodgkin's lymphoma in remission chronic lymphedema B12 deficiency who presents to [facility] emergency department with bilateral lower extremity edema much worse than her baseline lymphedema.   Upon evaluation in the emergency department chest x-ray revealed bilateral pleural effusions.  Clinically the patient was additionally felt to be suffering from cellulitis of the bilateral lower extremities due to open wounds of the legs due to worsening edema.  Patient was therefore initiated on intravenous Lasix  and vancomycin .  Hospitalist group was then called to assess the patient for admission to hospital.   Patient was managed with intravenous Lasix  resulting in excellent diuresis throughout the hospitalization.  Associated electrolyte abnormalities were replaced.  Strict input and output monitoring was performed as well as daily weights.  Concurrently, patient's cellulitis was treated with intravenous vancomycin  which was later transitioned to oral doxycycline .  Wound care nurse team evaluated the patient's associated bilateral lower extremity wounds and daily dressing changes were recommended and performed daily.  Patient's shortness of breath swelling and bilateral lower extremity pain all gradually improved throughout the hospitalization.  Patient was evaluated by physical therapy during this hospitalization and they recommended the patient would benefit from skilled services in a skilled nursing facility.

## 2023-06-21 NOTE — TOC Progression Note (Signed)
 Transition of Care [facility]) - Progression Note    Patient Details  Name: Allison Short MRN: [id number] Date of Birth: 1951-02-14  [facility] CM/SW Contact  Gertha Ku, LCSW Phone Number: 06/21/2023, 3:02 PM  Clinical Narrative:     CSW presented beds offers with Medicare Star rating,  she has chosen [facility]. CSW to start insurance auth for SNF placement. TOC to follow.    [facility] [address] [place], [place] [address] [phone] Overall rating? Below average  [facility] [address] [place], [place] [address] [phone] Overall rating ?? Below average  [facility] [address] [place], [place] [address] [phone] Overall rating ? Much below average  Expected Discharge Plan: Skilled Nursing Facility Barriers to Discharge: Continued Medical Work up, SNF Pending bed offer, English as a second language teacher  Expected Discharge Plan and Services       Living arrangements for the past 2 months: Apartment                                       Social Determinants of Health (SDOH) Interventions SDOH Screenings   Food Insecurity: No Food Insecurity (06/19/2023)  Housing: Low Risk  (06/19/2023)  Transportation Needs: Unmet Transportation Needs (06/19/2023)  Utilities: Not At Risk (06/19/2023)  Alcohol Screen: Low Risk  (05/29/2023)  Depression (PHQ2-9): Low Risk  (05/29/2023)  Financial Resource Strain: Low Risk  (05/29/2023)  Physical Activity: Inactive (05/29/2023)  Social Connections: Moderately Isolated (06/19/2023)  Stress: No Stress Concern Present (05/29/2023)  Tobacco Use: Low Risk  (06/18/2023)  Health Literacy: Adequate Health Literacy (05/29/2023)    Readmission Risk Interventions     No data to display

## 2023-06-21 NOTE — [place] FL2 (Signed)
   MEDICAID FL2 LEVEL OF CARE FORM     IDENTIFICATION  Patient Name: Allison Short Birthdate: 1950-08-18 Sex: female Admission Date (Current Location): 06/18/2023  [facility] and [place] Number:  Producer, television/film/video and Address:  [facility],  [address], [place] [address]      Provider Number: [id number]  Attending Physician Name and Address:  True Fuss, MD  Relative Name and Phone Number:       Current Level of Care: Hospital Recommended Level of Care: Skilled Nursing Facility Prior Approval Number:    Date Approved/Denied:   PASRR Number: [id number]  Discharge Plan: SNF    Current Diagnoses: Patient Active Problem List   Diagnosis Date Noted   Acute on chronic heart failure with preserved ejection fraction (HFpEF) (HCC) 06/19/2023   OSA (obstructive sleep apnea) 06/19/2023   Prediabetes 06/19/2023   Acute on chronic diastolic CHF (congestive heart failure) (HCC) 04/27/2022   Chronic respiratory failure with hypercapnia due to obesity hypoventilation syndrome and obstructive sleep apnea 04/15/2022   Delirium due to multiple etiologies, acute, hyperactive 04/15/2022   Lymphedema 04/13/2022   Cellulitis of lower extremity with chronic  lymphedema 04/08/2022   Normocytic anemia 04/08/2022   Cellulitis 08/20/2020   Morbid obesity (HCC) 08/20/2020   Hypokalemia 08/20/2020   Cellulitis of right lower extremity 08/20/2020   Acute kidney injury, ruled out 08/20/2020   Hypertension    Essential hypertension 07/08/2015   Non Hodgkin's lymphoma (HCC) 02/21/2006    Orientation RESPIRATION BLADDER Height & Weight     Self, Time, Situation, Place  Normal Continent Weight: (!) 365 lb 11.9 oz (165.9 kg) Height:  5\' 8"  (172.7 cm)  BEHAVIORAL SYMPTOMS/MOOD NEUROLOGICAL BOWEL NUTRITION STATUS      Continent Diet (2 Gram Sodium)  AMBULATORY STATUS COMMUNICATION OF NEEDS Skin   Extensive Assist Verbally Other (Comment) (review discharge summary)                        Personal Care Assistance Level of Assistance  Bathing, Feeding, Dressing Bathing Assistance: Limited assistance Feeding assistance: Independent Dressing Assistance: Limited assistance     Functional Limitations Info  Sight, Hearing, Speech Sight Info: Impaired (Glasses) Hearing Info: Adequate Speech Info: Adequate    SPECIAL CARE FACTORS FREQUENCY  PT (By licensed PT), OT (By licensed OT)     PT Frequency: 5 x a week OT Frequency: 5 x a week            Contractures Contractures Info: Not present    Additional Factors Info  Code Status, Allergies Code Status Info: full Allergies Info: Ciprofloxin Hcl (Ciprofloxacin)  Morphine And Codeine  Asa (Aspirin)  Chia Oil  Erythromycin  Nsaids  Penicillins           Current Medications (06/21/2023):  This is the current hospital active medication list Current Facility-Administered Medications  Medication Dose Route Frequency Provider Last Rate Last Admin   acetaminophen  (TYLENOL ) tablet 650 mg  650 mg Oral Q6H PRN Rathore, Vasundhra, MD   650 mg at 06/21/23 0809   Or   acetaminophen  (TYLENOL ) suppository 650 mg  650 mg Rectal Q6H PRN Juliette Oh, MD       cholecalciferol  (VITAMIN D3) 25 MCG (1000 UNIT) tablet 1,000 Units  1,000 Units Oral Daily Macdonald Savoy, MD   1,000 Units at 06/21/23 0931   doxycycline  (VIBRA -TABS) tablet 100 mg  100 mg Oral Q12H Macdonald Savoy, MD   100 mg at  06/21/23 0931   enoxaparin  (LOVENOX ) injection 80 mg  80 mg Subcutaneous Q24H Ellington, Abby K, RPH   80 mg at 06/21/23 1610   furosemide  (LASIX ) tablet 20 mg  20 mg Oral BID Macdonald Savoy, MD   20 mg at 06/21/23 0809   hydrOXYzine  (ATARAX ) tablet 25 mg  25 mg Oral QHS PRN Macdonald Savoy, MD   25 mg at 06/20/23 2228   metoprolol  tartrate (LOPRESSOR ) tablet 50 mg  50 mg Oral BID Rathore, Vasundhra, MD   50 mg at 06/21/23 9604   ondansetron  (ZOFRAN ) injection 4 mg  4 mg Intravenous Q6H PRN Macdonald Savoy, MD       potassium chloride  SA (KLOR-CON  M) CR tablet 20 mEq  20 mEq Oral BID Macdonald Savoy, MD   20 mEq at 06/21/23 5409     Discharge Medications: Please see discharge summary for a list of discharge medications.  Relevant Imaging Results:  Relevant Lab Results:   Additional Information SSN: [id number]  Arta Lark Jorian [place], LCSW

## 2023-06-21 NOTE — Plan of Care (Signed)

## 2023-06-21 NOTE — TOC Initial Note (Signed)
 Transition of Care [facility]) - Initial/Assessment Note    Patient Details  Name: Allison Short MRN: [id number] Date of Birth: 05/20/1950  [facility] CM/SW Contact:    Gertha Ku, LCSW Phone Number: 06/21/2023, 11:19 AM  Clinical Narrative:                 Csw met with pt to discuss recommendations for SNF placement. Pt has agreed. CSW explained the process noting pt will need insurance authorization. Pt stated she would not like to return to [facility].  Pt reports she lives home alone and has no transportation. CSW informed pt she will transition to facility via EMS transport. CSW to work pt up for SNF placement. TOC to follow.   Expected Discharge Plan: Skilled Nursing Facility Barriers to Discharge: Continued Medical Work up, SNF Pending bed offer, Insurance Authorization   Patient Goals and CMS Choice Patient states their goals for this hospitalization and ongoing recovery are:: pt wants SNF to get stronger          Expected Discharge Plan and Services       Living arrangements for the past 2 months: Apartment                                      Prior Living Arrangements/Services Living arrangements for the past 2 months: Apartment Lives with:: Self Patient language and need for interpreter reviewed:: Yes        Need for Family Participation in Patient Care: No (Comment) Care giver support system in place?: No (comment) Current home services: DME, Housekeeping Criminal Activity/Legal Involvement Pertinent to Current Situation/Hospitalization: No - Comment as needed  Activities of Daily Living   ADL Screening (condition at time of admission) Independently performs ADLs?: Yes (appropriate for developmental age) Is the patient deaf or have difficulty hearing?: No Does the patient have difficulty seeing, even when wearing glasses/contacts?: No Does the patient have difficulty concentrating, remembering, or making decisions?:  No  Permission Sought/Granted                  Emotional Assessment Appearance:: Appears stated age Attitude/Demeanor/Rapport: Gracious Affect (typically observed): Accepting Orientation: : Oriented to Place, Oriented to  Time, Oriented to Situation, Oriented to Self   Psych Involvement: No (comment)  Admission diagnosis:  Lymphedema [I89.0] Cellulitis of right lower extremity [L03.115] Acute on chronic heart failure with preserved ejection fraction (HFpEF) (HCC) [I50.33] Patient Active Problem List   Diagnosis Date Noted   Acute on chronic heart failure with preserved ejection fraction (HFpEF) (HCC) 06/19/2023   OSA (obstructive sleep apnea) 06/19/2023   Prediabetes 06/19/2023   Acute on chronic diastolic CHF (congestive heart failure) (HCC) 04/27/2022   Chronic respiratory failure with hypercapnia due to obesity hypoventilation syndrome and obstructive sleep apnea 04/15/2022   Delirium due to multiple etiologies, acute, hyperactive 04/15/2022   Lymphedema 04/13/2022   Cellulitis of lower extremity with chronic  lymphedema 04/08/2022   Normocytic anemia 04/08/2022   Cellulitis 08/20/2020   Morbid obesity (HCC) 08/20/2020   Hypokalemia 08/20/2020   Cellulitis of right lower extremity 08/20/2020   Acute kidney injury, ruled out 08/20/2020   Hypertension    Essential hypertension 07/08/2015   Non Hodgkin's lymphoma (HCC) 02/21/2006   PCP:  Alto Atta, NP Pharmacy:   [facility] [phone] - [place], [place] - [address] [facility] [address] [phone]  [address] [place] [address] Phone: [phone] Fax: [phone]     Social Drivers of Health (SDOH) Social History: SDOH Screenings   Food Insecurity: No Food Insecurity (06/19/2023)  Housing: Low Risk  (06/19/2023)  Transportation Needs: Unmet Transportation Needs (06/19/2023)  Utilities: Not At Risk (06/19/2023)  Alcohol Screen: Low Risk  (05/29/2023)  Depression (PHQ2-9): Low Risk   (05/29/2023)  Financial Resource Strain: Low Risk  (05/29/2023)  Physical Activity: Inactive (05/29/2023)  Social Connections: Moderately Isolated (06/19/2023)  Stress: No Stress Concern Present (05/29/2023)  Tobacco Use: Low Risk  (06/18/2023)  Health Literacy: Adequate Health Literacy (05/29/2023)   SDOH Interventions:     Readmission Risk Interventions     No data to display

## 2023-06-21 NOTE — Progress Notes (Signed)
 Pt is refusing CPAP at this time. CPAP is in room on standby. Pt is currently on Room Air and is tolerating well with no distress noted. Will continue to monitor pt.

## 2023-06-22 DIAGNOSIS — I890 Lymphedema, not elsewhere classified: Secondary | ICD-10-CM | POA: Diagnosis not present

## 2023-06-22 DIAGNOSIS — L03119 Cellulitis of unspecified part of limb: Secondary | ICD-10-CM | POA: Diagnosis not present

## 2023-06-22 DIAGNOSIS — G4733 Obstructive sleep apnea (adult) (pediatric): Secondary | ICD-10-CM

## 2023-06-22 DIAGNOSIS — L97919 Non-pressure chronic ulcer of unspecified part of right lower leg with unspecified severity: Secondary | ICD-10-CM

## 2023-06-22 DIAGNOSIS — E66813 Obesity, class 3: Secondary | ICD-10-CM

## 2023-06-22 DIAGNOSIS — I10 Essential (primary) hypertension: Secondary | ICD-10-CM

## 2023-06-22 DIAGNOSIS — R7303 Prediabetes: Secondary | ICD-10-CM

## 2023-06-22 DIAGNOSIS — L97929 Non-pressure chronic ulcer of unspecified part of left lower leg with unspecified severity: Secondary | ICD-10-CM

## 2023-06-22 DIAGNOSIS — I5033 Acute on chronic diastolic (congestive) heart failure: Secondary | ICD-10-CM | POA: Diagnosis not present

## 2023-06-22 DIAGNOSIS — Z6841 Body Mass Index (BMI) 40.0 and over, adult: Secondary | ICD-10-CM

## 2023-06-22 LAB — COMPREHENSIVE METABOLIC PANEL WITH GFR
ALT: 12 U/L (ref 0–44)
AST: 21 U/L (ref 15–41)
Albumin: 2.9 g/dL — ABNORMAL LOW (ref 3.5–5.0)
Alkaline Phosphatase: 116 U/L (ref 38–126)
Anion gap: 10 (ref 5–15)
BUN: 23 mg/dL (ref 8–23)
CO2: 29 mmol/L (ref 22–32)
Calcium: 8.3 mg/dL — ABNORMAL LOW (ref 8.9–10.3)
Chloride: 99 mmol/L (ref 98–111)
Creatinine, Ser: 0.68 mg/dL (ref 0.44–1.00)
GFR, Estimated: >60 mL/min (ref >60)
Glucose, Bld: 145 mg/dL — ABNORMAL HIGH (ref 70–99)
Potassium: 4.1 mmol/L (ref 3.5–5.1)
Sodium: 138 mmol/L (ref 135–145)
Total Bilirubin: 0.9 mg/dL (ref 0.0–1.2)
Total Protein: 6.4 g/dL — ABNORMAL LOW (ref 6.5–8.1)

## 2023-06-22 LAB — MAGNESIUM: Magnesium: 1.9 mg/dL (ref 1.7–2.4)

## 2023-06-22 MED ORDER — ORAL CARE MOUTH RINSE: 15.0000 mL | OROMUCOSAL | Status: DC | PRN

## 2023-06-22 MED ORDER — BISACODYL 5 MG PO TBEC
5.0000 mg | DELAYED_RELEASE_TABLET | Freq: Every day | ORAL | Status: DC | PRN
  Administered 2023-06-22 – 2023-06-24 (×2): 5 mg via ORAL
  Filled 2023-06-22 (×2): qty 1

## 2023-06-22 MED ORDER — FUROSEMIDE 10 MG/ML IJ SOLN
40.0000 mg | Freq: Two times a day (BID) | INTRAMUSCULAR | Status: DC
  Administered 2023-06-22 – 2023-06-25 (×6): 40 mg via INTRAVENOUS
  Filled 2023-06-22 (×6): qty 4

## 2023-06-22 MED ORDER — ALBUTEROL SULFATE (2.5 MG/3ML) 0.083% IN NEBU
2.5000 mg | INHALATION_SOLUTION | Freq: Four times a day (QID) | RESPIRATORY_TRACT | Status: DC
  Administered 2023-06-22 – 2023-06-23 (×4): 2.5 mg via RESPIRATORY_TRACT
  Filled 2023-06-22 (×5): qty 3

## 2023-06-22 MED ORDER — DOXYCYCLINE HYCLATE 100 MG PO TABS: 100.0000 mg | ORAL_TABLET | Freq: Two times a day (BID) | ORAL | Status: DC

## 2023-06-22 NOTE — TOC Transition Note (Signed)
 Transition of Care [facility]) - Discharge Note   Patient Details  Name: Allison Short MRN: [id number] Date of Birth: [birth year]  [facility]) CM/SW Contact:  Gertha Ku, LCSW Phone Number: 06/22/2023, 2:16 PM   Clinical Narrative:     Pt's insurance [employer] was approved. Pt to d/c to [place], room 310 , RN to cal report to [phone] ex [phone]. Pt will be transported via [facility]. TOC to call for transport when ready.   Final next level of care: Skilled Nursing Facility Barriers to Discharge: Continued Medical Work up, SNF Pending bed offer, Insurance Authorization   Patient Goals and CMS Choice Patient states their goals for this hospitalization and ongoing recovery are:: pt wants SNF to get stronger          Discharge Placement              Patient chooses bed at: [facility] Patient to be transferred to facility by: ems   Patient and family notified of of transfer: 06/22/23  Discharge Plan and Services Additional resources added to the After Visit Summary for                                       Social Drivers of Health (SDOH) Interventions SDOH Screenings   Food Insecurity: No Food Insecurity (06/19/2023)  Housing: Low Risk  (06/19/2023)  Transportation Needs: Unmet Transportation Needs (06/22/2023)  Utilities: Not At Risk (06/19/2023)  Alcohol Screen: Low Risk  (05/29/2023)  Depression (PHQ2-9): Low Risk  (05/29/2023)  Financial Resource Strain: Low Risk  (05/29/2023)  Physical Activity: Inactive (05/29/2023)  Social Connections: Moderately Isolated (06/19/2023)  Stress: No Stress Concern Present (05/29/2023)  Tobacco Use: Low Risk  (06/18/2023)  Health Literacy: Adequate Health Literacy (05/29/2023)     Readmission Risk Interventions     No data to display

## 2023-06-22 NOTE — Assessment & Plan Note (Signed)
 Pain seems to be improving with continued antibiotic therapy.   Patient was initially treated with intravenous antibiotics and has now been transition to oral doxycycline  Will complete at least a 7-day course of antibiotics Continuing dressing changes considering concurrent bilateral lower extremity ulcerations. Etiology seems to be secondary to skin breakdown in the setting of poorly managed lymphedema exacerbated by acute cardiogenic volume overload

## 2023-06-22 NOTE — Assessment & Plan Note (Signed)
 Continued Improvement in pain on antibiotic therapy Patient was initially treated with intravenous antibiotics and has now been transitioned to oral doxycycline  Will complete at least a 7-day course of antibiotics Continuing dressing changes considering concurrent bilateral lower extremity ulcerations. Etiology seems to be secondary to skin breakdown in the setting of poorly managed lymphedema exacerbated by acute cardiogenic volume overload

## 2023-06-22 NOTE — Discharge Instructions (Addendum)
 Patient is full code  Patient may get out of bed to with assistance using an assistive device Patient to be ministered all prescribed medications exactly as instructed. Patient receives a low-sodium low carbohydrate diet Follow-up with facility provider per protocol.   Please bring patient back to the emergency department if he develops sudden is in mentation, shortness of breath,  fevers of greater than 100.4 F or weakness.   Patient may participate in physical therapy per the recommendations. Wound care instructions as follows:  Wound care  Daily     Comments: Cleanse B lower legs and dorsal feet (intact skin and ulcerations) with Vashe wound cleanser , do not rinse and allow to air dry. Apply silver hydrofiber   to foot and leg wounds daily (any weeping area), cover with ABD pad and secure with Kerlix roll gauze wrapped beginning just above toes and ending right below knees.  May apply Ace bandage wrapped in same fashion as Kerlix for light compression

## 2023-06-22 NOTE — Assessment & Plan Note (Signed)
 On metformin  twice daily in the outpatient setting Last hemoglobin A1c 5.9% 4/28

## 2023-06-22 NOTE — Assessment & Plan Note (Signed)
?   CPAP nightly with sleep ?

## 2023-06-22 NOTE — Assessment & Plan Note (Signed)
 Patient continuing to exhibit extensive pitting edema of the bilateral lower extremities tracking up to the thighs with rales on lung exam Patient reports nearly 70 pound weight gain since 01/2023. Increasing Lasix  dosing to 40 mg IV twice daily Daily weights Strict input and output monitoring Monitoring renal function and electrolytes with serial chemistries

## 2023-06-22 NOTE — Assessment & Plan Note (Signed)
 Secondary to multifactorial edema of the bilateral lower extremities leading to skin breakdown and ulcer formation. Wound care consultation was obtained Daily dressing changes per their recommendations.

## 2023-06-22 NOTE — Assessment & Plan Note (Signed)
 Severe bilateral lower extremity lymphedema but seems to have been exacerbated by cardiogenic volume overload Attempting to reduce degree of swelling with intravenous diuretics, will also need close outpatient follow-up with the lymphedema clinic after discharge.

## 2023-06-22 NOTE — Assessment & Plan Note (Signed)
 Continue metoprolol.

## 2023-06-22 NOTE — Plan of Care (Signed)
  Problem: Education: Goal: Knowledge of General Education information will improve Description: Including pain rating scale, medication(s)/side effects and non-pharmacologic comfort measures Outcome: Progressing   Problem: Health Behavior/Discharge Planning: Goal: Ability to manage health-related needs will improve Outcome: Progressing   Problem: Clinical Measurements: Goal: Ability to maintain clinical measurements within normal limits will improve Outcome: Progressing Goal: Will remain free from infection Outcome: Progressing Goal: Diagnostic test results will improve Outcome: Progressing Goal: Respiratory complications will improve Outcome: Progressing Goal: Cardiovascular complication will be avoided Outcome: Progressing   Problem: Activity: Goal: Risk for activity intolerance will decrease Outcome: Progressing   Problem: Nutrition: Goal: Adequate nutrition will be maintained Outcome: Progressing   Problem: Coping: Goal: Level of anxiety will decrease Outcome: Progressing   Problem: Elimination: Goal: Will not experience complications related to bowel motility Outcome: Progressing Goal: Will not experience complications related to urinary retention Outcome: Progressing   Problem: Pain Managment: Goal: General experience of comfort will improve and/or be controlled Outcome: Progressing   Problem: Safety: Goal: Ability to remain free from injury will improve Outcome: Progressing   Problem: Skin Integrity: Goal: Risk for impaired skin integrity will decrease Outcome: Progressing   Problem: Education: Goal: Ability to demonstrate management of disease process will improve Outcome: Progressing Goal: Ability to verbalize understanding of medication therapies will improve Outcome: Progressing Goal: Individualized Educational Video(s) Outcome: Progressing   Problem: Activity: Goal: Capacity to carry out activities will improve Outcome: Progressing    Problem: Cardiac: Goal: Ability to achieve and maintain adequate cardiopulmonary perfusion will improve Outcome: Progressing   Problem: Clinical Measurements: Goal: Ability to avoid or minimize complications of infection will improve Outcome: Progressing   Problem: Skin Integrity: Goal: Skin integrity will improve Outcome: Progressing  Pt understand to watch her intake of fluids.

## 2023-06-22 NOTE — Discharge Summary (Signed)
 Physician Discharge Summary   Patient: Allison Short MRN: [id number] DOB: 04-17-1950  Admit date:     06/18/2023  Discharge date: 06/22/23  Discharge Physician: True Fuss   PCP: Alto Atta, NP   Recommendations at discharge:   Patient is full code  Patient may get out of bed to with assistance using an assistive device Patient to be ministered all prescribed medications exactly as instructed. Patient receives a low-sodium low carbohydrate diet Follow-up with facility provider per protocol.   Please bring patient back to the emergency department if he develops sudden is in mentation, shortness of breath,  fevers of greater than 100.4 F or weakness.   Patient may participate in physical therapy per the recommendations. Wound care instructions as follows:  Wound care  Daily     Comments: Cleanse B lower legs and dorsal feet (intact skin and ulcerations) with Vashe wound cleanser , do not rinse and allow to air dry. Apply silver hydrofiber   to foot and leg wounds daily (any weeping area), cover with ABD pad and secure with Kerlix roll gauze wrapped beginning just above toes and ending right below knees.  May apply Ace bandage wrapped in same fashion as Kerlix for light compression  Discharge Diagnoses: Principal Problem:   Acute on chronic heart failure with preserved ejection fraction (HFpEF) (HCC) Active Problems:   Cellulitis of lower extremity with chronic  lymphedema   Essential hypertension   OSA (obstructive sleep apnea)   Prediabetes  Resolved Problems:   * No resolved hospital problems. *   Hospital Course: 73 y.o. 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   Upon evaluation in the emergency department chest x-ray revealed bilateral pleural effusions.  Clinically the patient was additionally felt to be suffering from cellulitis of the bilateral lower extremities due to open wounds of the legs due to worsening edema.  Patient was therefore initiated on intravenous Lasix  and vancomycin .  Hospitalist group was then called to assess the patient for admission to hospital.   Patient was managed with intravenous Lasix  resulting in excellent diuresis throughout the hospitalization.  Associated electrolyte abnormalities were replaced.  Strict input and output monitoring was performed as well as daily weights.  Concurrently, patient's cellulitis was treated with intravenous vancomycin  which was later transitioned to oral doxycycline .  Wound care nurse team evaluated the patient's associated bilateral lower extremity wounds and daily dressing changes were recommended and performed daily.  Patient's shortness of breath swelling and bilateral lower extremity pain all gradually improved throughout the hospitalization.  Patient was evaluated by physical therapy during this hospitalization and they recommended the patient would benefit from skilled services in a skilled nursing facility.  Arranges were made for the patient to be discharged to [facility] skilled nursing facility on 06/22/2023 in improved and stable condition.     Pain control - [place]  Controlled Substance Reporting System database was reviewed. and patient was instructed, not to drive, operate heavy machinery, perform activities at heights, swimming or participation in water activities or provide baby-sitting services while on Pain, Sleep and Anxiety Medications; until their outpatient Physician has advised to do so again. Also recommended to not to take more than prescribed Pain, Sleep and Anxiety Medications.   Consultants: none Procedures  performed: none  Disposition: Skilled nursing facility Diet recommendation:  Discharge Diet Orders (From admission, onward)     Start     Ordered   06/22/23 0000  Diet - low sodium heart healthy        06/22/23 1427           Cardiac and Carb modified diet  DISCHARGE MEDICATION: Allergies as of 06/22/2023       Reactions   Ciprofloxin Hcl [ciprofloxacin] Other (See Comments)   Unstable gait   Morphine And Codeine Anaphylaxis, Other (See Comments)   Tolerates hydrocodone    Asa [aspirin] Hives, Other (See Comments)   Hives can become SEVERE, requiring intervention   Chia Oil Swelling, Other (See Comments)   Possibly "chia tea" = Face and lips became swollen   Erythromycin Other (See Comments)   Reaction not recalled   Nsaids Other (See Comments)   Lowers GFR   Penicillins Other (See Comments)   Reaction not recalled- was told to "never take this again" by family        Medication List     TAKE these medications    acetaminophen  500 MG tablet Commonly known as: TYLENOL  Take 1,000 mg by mouth every 8 (eight) hours as needed for mild pain, moderate pain or headache.   bisacodyl  5 MG EC tablet Generic drug: bisacodyl  Take 5 mg by mouth daily as needed for moderate constipation.   cyanocobalamin  1000 MCG tablet Commonly known as: VITAMIN B12 Take 1 tablet (1,000 mcg total) by mouth daily.   doxycycline  100 MG tablet Commonly known as: VIBRA -TABS Take 1 tablet (100 mg total) by mouth every 12 (twelve) hours for 5 doses.   furosemide  20 MG tablet Commonly known as: LASIX  Take 1 tablet (20 mg total) by mouth daily.   hydrOXYzine  25 MG tablet Commonly known as: ATARAX  Take 1 tablet (25 mg total) by mouth at bedtime as needed for anxiety.   metFORMIN  500 MG tablet Commonly known as: GLUCOPHAGE  Take 1 tablet (500 mg total) by mouth 2 (two) times daily with a meal.   metoprolol  tartrate 50 MG tablet Commonly known as: LOPRESSOR  Take 1 tablet (50 mg total) by  mouth 2 (two) times daily.   potassium chloride  10 MEQ tablet Commonly known as: KLOR-CON  M Take 1 tablet (10 mEq total) by mouth daily.   sodium chloride  0.65 % Soln nasal spray Commonly known as: OCEAN Place 1 spray into both nostrils as needed for congestion.   Vitamin D3 1000 units Caps Take 1,000 Units by mouth daily.               Discharge Care Instructions  (From admission, onward)           Start     Ordered   06/22/23 0000  Discharge wound care:       Comments: Wound care  Daily     Comments: Cleanse B lower legs and dorsal feet (intact skin and ulcerations) with Vashe wound cleanser , do not rinse and allow to air dry. Apply silver hydrofiber   to foot and leg wounds daily (any weeping area), cover with ABD pad and secure with Kerlix roll gauze wrapped beginning just above toes and ending right below knees.  May apply Ace bandage wrapped in same fashion as Kerlix for light compression   06/22/23 1427            Contact information for after-discharge care     Destination     [facility] Preferred SNF .   Service: Skilled Nursing Contact information: [address] [place]  [address] [phone]                     Discharge Exam: Filed Weights   06/19/23 1240 06/20/23 0400 06/22/23 0500  Weight: (!) 169.9 kg (!) 165.9 kg (!) 168.6 kg    Constitutional: Awake alert and oriented x3, no associated distress.  Patient is obese. Respiratory: clear to auscultation bilaterally, no wheezing, no crackles. Normal respiratory effort. No accessory muscle use.  Cardiovascular: Regular rate and rhythm, no murmurs / rubs / gallops. No extremity edema. 2+ pedal pulses. No carotid bruits.  Abdomen: Abdomen is protuberant but soft and nontender.  No evidence of intra-abdominal masses.  Positive bowel sounds noted in all quadrants.   Musculoskeletal: No joint deformity upper and lower extremities. Good ROM,  no contractures. Normal muscle tone.     Condition at discharge: fair  The results of significant diagnostics from this hospitalization (including imaging, microbiology, ancillary and laboratory) are listed below for reference.   Imaging Studies: ECHOCARDIOGRAM COMPLETE Result Date: 06/19/2023    ECHOCARDIOGRAM REPORT   Patient Name:   MONALEE THACKERAY Date of Exam: 06/19/2023 Medical Rec #:  [id number]  Height:       68.0 in Accession #:    [id number] Weight:       374.6 lb Date of Birth:  Aug 03, 1950   BSA:          2.668 m Patient Age:    72 years   BP:           153/70 mmHg Patient Gender: F          HR:           84 bpm. Exam Location:  Inpatient Procedure: 2D Echo, Cardiac Doppler, Color Doppler and Intracardiac            Opacification Agent (Both Spectral and Color Flow Doppler were            utilized during procedure). Indications:    CHF-acute diastolic  History:        Patient has prior history of Echocardiogram examinations, most                 recent 04/28/2022. Risk Factors:Hypertension and Sleep Apnea.  Sonographer:    Juanita Shaw Referring Phys: [id number] RJJOACZYS RATHORE  Sonographer Comments: Technically difficult study due to poor echo windows, suboptimal apical window, suboptimal parasternal window and suboptimal subcostal window. Image acquisition challenging due to patient body habitus. IMPRESSIONS  1. Left ventricular ejection fraction, by estimation, is 50 to 55%. The left ventricle has low normal function. The left ventricle demonstrates global hypokinesis. Left ventricular diastolic parameters are indeterminate.  2. Right ventricular systolic function is normal. The right ventricular size is visually enlarged.  3. The mitral valve is degenerative. No evidence of mitral valve regurgitation. No evidence of mitral stenosis.  4. The aortic valve is grossly normal. Aortic valve regurgitation is not visualized. Aortic valve sclerosis is present, with no evidence of aortic valve stenosis.  5. The  inferior vena cava is dilated in size with <50% respiratory variability, suggesting right atrial pressure of 15 mmHg. FINDINGS  Left Ventricle: Left ventricular ejection fraction, by estimation, is 50 to 55%. The left ventricle has low normal function. The left ventricle demonstrates global hypokinesis. The left ventricular internal cavity size was normal in size. There is no left ventricular hypertrophy. Left ventricular diastolic parameters  are indeterminate. Right Ventricle: The right ventricular size is visually enlarged. No increase in right ventricular wall thickness. Right ventricular systolic function is normal. Left Atrium: Left atrial size was normal in size. Right Atrium: Right atrial size was normal in size. Pericardium: There is no evidence of pericardial effusion. Mitral Valve: The mitral valve is degenerative in appearance. No evidence of mitral valve regurgitation. No evidence of mitral valve stenosis. MV peak gradient, 4.2 mmHg. The mean mitral valve gradient is 2.0 mmHg. Tricuspid Valve: The tricuspid valve is normal in structure. Tricuspid valve regurgitation is trivial. No evidence of tricuspid stenosis. Aortic Valve: The aortic valve is grossly normal. Aortic valve regurgitation is not visualized. Aortic valve sclerosis is present, with no evidence of aortic valve stenosis. Aortic valve mean gradient measures 7.0 mmHg. Aortic valve peak gradient measures 11.7 mmHg. Aortic valve area, by VTI measures 2.08 cm. Pulmonic Valve: The pulmonic valve was not well visualized. Pulmonic valve regurgitation is trivial. No evidence of pulmonic stenosis. Aorta: The aortic root and ascending aorta are structurally normal, with no evidence of dilitation. Venous: The inferior vena cava is dilated in size with less than 50% respiratory variability, suggesting right atrial pressure of 15 mmHg. IAS/Shunts: The interatrial septum was not well visualized.  LEFT VENTRICLE PLAX 2D LVIDd:         5.20 cm       Diastology LVIDs:         3.60 cm      LV e' medial:    5.77 cm/s LV PW:         0.90 cm      LV E/e' medial:  15.9 LV IVS:        0.90 cm      LV e' lateral:   9.46 cm/s LVOT diam:     2.20 cm      LV E/e' lateral: 9.7 LV SV:         70 LV SV Index:   26 LVOT Area:     3.80 cm  LV Volumes (MOD) LV vol d, MOD A4C: 154.0 ml LV vol s, MOD A4C: 73.3 ml LV SV MOD A4C:     154.0 ml RIGHT VENTRICLE             IVC RV S prime:     11.10 cm/s  IVC diam: 2.30 cm TAPSE (M-mode): 2.5 cm LEFT ATRIUM             Index        RIGHT ATRIUM           Index LA diam:        4.30 cm 1.61 cm/m   RA Area:     14.40 cm LA Vol (A2C):   86.0 ml 32.23 ml/m  RA Volume:   35.10 ml  13.15 ml/m LA Vol (A4C):   67.0 ml 25.11 ml/m LA Biplane Vol: 79.2 ml 29.68 ml/m  AORTIC VALVE                     PULMONIC VALVE AV Area (Vmax):    2.02 cm      PV Vmax:       0.98 m/s AV Area (Vmean):   2.03 cm      PV Peak grad:  3.9 mmHg AV Area (VTI):     2.08 cm AV Vmax:           171.00 cm/s AV Vmean:  121.000 cm/s AV VTI:            0.335 m AV Peak Grad:      11.7 mmHg AV Mean Grad:      7.0 mmHg LVOT Vmax:         90.80 cm/s LVOT Vmean:        64.700 cm/s LVOT VTI:          0.183 m LVOT/AV VTI ratio: 0.55  AORTA Ao Root diam: 2.80 cm Ao Asc diam:  3.10 cm MITRAL VALVE MV Area (PHT): 3.93 cm    SHUNTS MV Area VTI:   3.00 cm    Systemic VTI:  0.18 m MV Peak grad:  4.2 mmHg    Systemic Diam: 2.20 cm MV Mean grad:  2.0 mmHg MV Vmax:       1.03 m/s MV Vmean:      64.8 cm/s MV E velocity: 91.90 cm/s MV A velocity: 70.00 cm/s MV E/A ratio:  1.31 Sunit Tolia Electronically signed by Olinda Bertrand Signature Date/Time: 06/19/2023/3:33:21 PM    Final    DG Chest 2 View Result Date: 06/18/2023 CLINICAL DATA:  Fluid overload EXAM: CHEST - 2 VIEW COMPARISON:  None Available. FINDINGS: Lungs are clear. No pneumothorax. Small bilateral pleural effusions are present. Cardiac size is enlarged unchanged. Pulmonary vascularity is. Osseous structures are  age-appropriate. No acute bone abnormality. IMPRESSION: 1. Small bilateral pleural effusions. 2. Stable cardiomegaly. Electronically Signed   By: Worthy Heads M.D.   On: 06/18/2023 23:48    Microbiology: Results for orders placed or performed during the hospital encounter of 04/08/22  MRSA Next Gen by PCR, Nasal     Status: None   Collection Time: 04/13/22  3:36 PM   Specimen: Nasal Mucosa; Nasal Swab  Result Value Ref Range Status   MRSA by PCR Next Gen NOT DETECTED NOT DETECTED Final    Comment: (NOTE) The GeneXpert MRSA Assay (FDA approved for NASAL specimens only), is one component of a comprehensive MRSA colonization surveillance program. It is not intended to diagnose MRSA infection nor to guide or monitor treatment for MRSA infections. Test performance is not FDA approved in patients less than 50 years old. Performed at [facility], [address], [place], [place] [address]     Labs: CBC: Recent Labs  Lab 06/18/23 0013  WBC 9.8  NEUTROABS 6.9  HGB 11.2*  HCT 36.9  MCV 101.9*  PLT 278   Basic Metabolic Panel: Recent Labs  Lab 06/18/23 0013 06/19/23 0449 06/20/23 0558 06/21/23 0405 06/22/23 0414  NA 141 141 140 137 138  K 4.2 4.1 3.5 3.8 4.1  CL 103 102 99 99 99  CO2 28 28 33* 29 29  GLUCOSE 134* 129* 146* 139* 145*  BUN 24* 22 23 23 23   CREATININE 0.92 0.92 1.07* 0.93 0.68  CALCIUM 9.0 9.0 8.5* 8.2* 8.3*  MG  --   --   --   --  1.9   Liver Function Tests: Recent Labs  Lab 06/19/23 0449 06/22/23 0414  AST 14* 21  ALT 12 12  ALKPHOS 117 116  BILITOT 1.0 0.9  PROT 7.0 6.4*  ALBUMIN  3.4* 2.9*   CBG: No results for input(s): "GLUCAP" in the last 168 hours.  Discharge time spent: greater than 30 minutes.  Signed: True Fuss, MD [facility] 06/22/2023

## 2023-06-22 NOTE — Assessment & Plan Note (Signed)
 Patient continuing to exhibit extensive pitting edema of the bilateral lower extremities tracking up to the thighs with rales on lung exam Patient reports nearly 70 pound weight gain since 01/2023. Continuing to treat with Lasix  20 mg IV twice daily Daily weights Strict input and output monitoring Monitoring renal function and electrolytes with serial chemistries

## 2023-06-22 NOTE — Progress Notes (Addendum)
 PROGRESS NOTE   Allison Short  ZOX:[id number] DOB: [birth year] DOA: 06/18/2023 PCP: Alto Atta, NP   Date of Service: the patient was seen and examined on 06/22/2023  Brief Narrative:  73 y.o. female past medical history significant for essential hypertension, morbid obesity, obstructive sleep apnea/obesity hypoventilation syndrome on CPAP, chronic hypoxic and hypercapnic respiratory failure, non-Hodgkin's lymphoma in remission chronic lymphedema B12 deficiency who presents to [facility] emergency department with bilateral lower extremity edema much worse than her baseline lymphedema.   Upon evaluation in the emergency department chest x-ray revealed bilateral pleural effusions.  Clinically the patient was additionally felt to be suffering from cellulitis of the bilateral lower extremities due to open wounds of the legs due to worsening edema.  Patient was therefore initiated on intravenous Lasix  and vancomycin .  Hospitalist group was then called to assess the patient for admission to hospital.   Patient was managed with intravenous Lasix  resulting in excellent diuresis throughout the hospitalization.  Associated electrolyte abnormalities were replaced.  Strict input and output monitoring was performed as well as daily weights.  Concurrently, patient's cellulitis was treated with intravenous vancomycin  which was later transitioned to oral doxycycline .  Wound care nurse team evaluated the patient's associated bilateral lower extremity wounds and daily dressing changes were recommended and performed daily.  Patient's shortness of breath swelling and bilateral lower extremity pain all gradually improved throughout the hospitalization.  Patient was evaluated by physical therapy during this hospitalization and they recommended the patient would benefit from skilled services in a skilled nursing facility.    Assessment & Plan Acute on chronic heart failure with preserved ejection fraction  (HFpEF) (HCC) Patient continuing to exhibit extensive pitting edema of the bilateral lower extremities tracking up to the thighs with rales on lung exam Patient reports nearly 70 pound weight gain since 01/2023. Increasing Lasix  dosing to 40 mg IV twice daily Daily weights Strict input and output monitoring Monitoring renal function and electrolytes with serial chemistries Cellulitis of lower extremity with chronic  lymphedema Continued Improvement in pain on antibiotic therapy Patient was initially treated with intravenous antibiotics and has now been transitioned to oral doxycycline  Will complete at least a 7-day course of antibiotics Continuing dressing changes considering concurrent bilateral lower extremity ulcerations. Etiology seems to be secondary to skin breakdown in the setting of poorly managed lymphedema exacerbated by acute cardiogenic volume overload Essential hypertension Continue metoprolol  OSA (obstructive sleep apnea) CPAP nightly with sleep Prediabetes On metformin  twice daily in the outpatient setting Last hemoglobin A1c 5.9% 4/28 Class 3 severe obesity due to excess calories with serious comorbidity and body mass index (BMI) of 50.0 to 59.9 in adult Counseling on increasing physical activity and caloric restriction Lymphedema of both lower extremities Severe bilateral lower extremity lymphedema but seems to have been exacerbated by cardiogenic volume overload Attempting to reduce degree of swelling with intravenous diuretics, will also need close outpatient follow-up with the lymphedema clinic after discharge. Ulcers of both lower legs (HCC) Secondary to multifactorial edema of the bilateral lower extremities leading to skin breakdown and ulcer formation. Wound care consultation was obtained Daily dressing changes per their recommendations.     Subjective:   Patient's right lower extremity pain is mild in intensity, sore in quality, nonradiating, worse with  weightbearing.  Patient states that her pain is improving daily.  Physical Exam:     06/22/2023    9:25 PM 06/22/2023    9:00 PM 06/22/2023   10:57 AM  Vitals with BMI  Systolic  138 138 144  Diastolic 58 58 75  Pulse  88 74     Constitutional: Awake alert and oriented x3, no associated distress.  Patient is obese. Skin: Dressings of the bilateral lower extremities are clean dry and intact.  Patient exhibits extremely dry hyperkeratinized skin of the anterior surfaces of the bilateral lower extremities.   Eyes: Pupils are equally reactive to light.  No evidence of scleral icterus or conjunctival pallor.  ENMT: Moist mucous membranes noted.  Posterior pharynx clear of any exudate or lesions.   Respiratory: Diminished breath sounds at the bases with bibasilar rales.  Normal respiratory effort. No accessory muscle use.  Cardiovascular: Slight elevation of jugular venous pulse noted.  Regular rate and rhythm, no murmurs / rubs / gallops.  Extensive bilateral lower extremity pitting edema that tracks from the feet all the way up to the thighs bilaterally.  2+ pedal pulses. No carotid bruits.  Abdomen: Abdomen is soft and nontender.  No evidence of intra-abdominal masses.  Positive bowel sounds noted in all quadrants.   Musculoskeletal: Diffuse tenderness of the distal right lower extremity.  Good ROM, no contractures. Normal muscle tone.    Data Reviewed:  I have personally reviewed and interpreted labs, imaging.  Significant findings are   CBC: Recent Labs  Lab 06/18/23 0013  WBC 9.8  NEUTROABS 6.9  HGB 11.2*  HCT 36.9  MCV 101.9*  PLT 278   Basic Metabolic Panel: Recent Labs  Lab 06/18/23 0013 06/19/23 0449 06/20/23 0558 06/21/23 0405 06/22/23 0414  NA 141 141 140 137 138  K 4.2 4.1 3.5 3.8 4.1  CL 103 102 99 99 99  CO2 28 28 33* 29 29  GLUCOSE 134* 129* 146* 139* 145*  BUN 24* 22 23 23 23   CREATININE 0.92 0.92 1.07* 0.93 0.68  CALCIUM 9.0 9.0 8.5* 8.2* 8.3*  MG  --    --   --   --  1.9   GFR: Estimated Creatinine Clearance: 103 mL/min (by C-G formula based on SCr of 0.68 mg/dL). Liver Function Tests: Recent Labs  Lab 06/19/23 0449 06/22/23 0414  AST 14* 21  ALT 12 12  ALKPHOS 117 116  BILITOT 1.0 0.9  PROT 7.0 6.4*  ALBUMIN  3.4* 2.9*     Code Status:  Full code.  Code status decision has been confirmed with: patient    Severity of Illness:  The appropriate patient status for this patient is INPATIENT. Inpatient status is judged to be reasonable and necessary in order to provide the required intensity of service to ensure the patient's safety. The patient's presenting symptoms, physical exam findings, and initial radiographic and laboratory data in the context of their chronic comorbidities is felt to place them at high risk for further clinical deterioration. Furthermore, it is not anticipated that the patient will be medically stable for discharge from the hospital within 2 midnights of admission.   * I certify that at the point of admission it is my clinical judgment that the patient will require inpatient hospital care spanning beyond 2 midnights from the point of admission due to high intensity of service, high risk for further deterioration and high frequency of surveillance required.*  Time spent:  45 minutes  Author:  True Fuss MD  06/22/2023

## 2023-06-22 NOTE — Progress Notes (Signed)
 PROGRESS NOTE   Allison Short  ZOX:[id number] DOB: [birth year] DOA: 06/18/2023 PCP: Alto Atta, NP   Date of Service: the patient was seen and examined on 06/21/2023  Brief Narrative:  73 y.o. female past medical history significant for essential hypertension, morbid obesity, obstructive sleep apnea/obesity hypoventilation syndrome on CPAP, chronic hypoxic and hypercapnic respiratory failure, non-Hodgkin's lymphoma in remission chronic lymphedema B12 deficiency who presents to [facility] emergency department with bilateral lower extremity edema much worse than her baseline lymphedema.   Upon evaluation in the emergency department chest x-ray revealed bilateral pleural effusions.  Clinically the patient was additionally felt to be suffering from cellulitis of the bilateral lower extremities due to open wounds of the legs due to worsening edema.  Patient was therefore initiated on intravenous Lasix  and vancomycin .  Hospitalist group was then called to assess the patient for admission to hospital.   Patient was managed with intravenous Lasix  resulting in excellent diuresis throughout the hospitalization.  Associated electrolyte abnormalities were replaced.  Strict input and output monitoring was performed as well as daily weights.  Concurrently, patient's cellulitis was treated with intravenous vancomycin  which was later transitioned to oral doxycycline .  Wound care nurse team evaluated the patient's associated bilateral lower extremity wounds and daily dressing changes were recommended and performed daily.  Patient's shortness of breath swelling and bilateral lower extremity pain all gradually improved throughout the hospitalization.  Patient was evaluated by physical therapy during this hospitalization and they recommended the patient would benefit from skilled services in a skilled nursing facility.    Assessment & Plan Acute on chronic heart failure with preserved ejection fraction  (HFpEF) (HCC) Patient continuing to exhibit extensive pitting edema of the bilateral lower extremities tracking up to the thighs with rales on lung exam Patient reports nearly 70 pound weight gain since 01/2023. Continuing to treat with Lasix  20 mg IV twice daily Daily weights Strict input and output monitoring Monitoring renal function and electrolytes with serial chemistries Cellulitis of lower extremity with chronic  lymphedema Pain seems to be improving with continued antibiotic therapy.   Patient was initially treated with intravenous antibiotics and has now been transition to oral doxycycline  Will complete at least a 7-day course of antibiotics Continuing dressing changes considering concurrent bilateral lower extremity ulcerations. Etiology seems to be secondary to skin breakdown in the setting of poorly managed lymphedema exacerbated by acute cardiogenic volume overload Essential hypertension Continue metoprolol  OSA (obstructive sleep apnea) CPAP nightly with sleep Prediabetes On metformin  twice daily in the outpatient setting Last hemoglobin A1c 5.9% 4/28 Class 3 severe obesity due to excess calories with serious comorbidity and body mass index (BMI) of 50.0 to 59.9 in adult Counseling on increasing physical activity and caloric restriction Lymphedema of both lower extremities Severe bilateral lower extremity lymphedema but seems to have been exacerbated by cardiogenic volume overload Attempting to reduce degree of swelling with intravenous diuretics, will also need close outpatient follow-up with the lymphedema clinic after discharge. Ulcers of both lower legs (HCC) Secondary to multifactorial edema of the bilateral lower extremities leading to skin breakdown and ulcer formation. Wound care consultation was obtained Daily dressing changes per their recommendations.     Subjective:  Patient continuing to complain of bilateral lower extremity pain, worse in the right lower  extremity.  Patient's pain is mild to moderate intensity, sore in quality, nonradiating, worse with weightbearing.  Patient states that her pain is improving daily.  Physical Exam:  Temp 98.8 F, respiratory rate 20, blood pressure  133/67, SPO2 100% on nasal cannula  Constitutional: Awake alert and oriented x3, no associated distress.  Patient is obese. Skin: Dressings of the bilateral lower extremities are clean dry and intact.  Patient exhibits extremely dry hyperkeratinized skin of the anterior surfaces of the bilateral lower extremities.   Eyes: Pupils are equally reactive to light.  No evidence of scleral icterus or conjunctival pallor.  ENMT: Moist mucous membranes noted.  Posterior pharynx clear of any exudate or lesions.   Respiratory: Diminished breath sounds at the bases with bibasilar rales.  Normal respiratory effort. No accessory muscle use.  Cardiovascular: Slight elevation of jugular venous pulse noted.  Regular rate and rhythm, no murmurs / rubs / gallops.  Extensive bilateral lower extremity pitting edema that tracks from the feet all the way up to the thighs bilaterally.  2+ pedal pulses. No carotid bruits.  Abdomen: Abdomen is soft and nontender.  No evidence of intra-abdominal masses.  Positive bowel sounds noted in all quadrants.   Musculoskeletal: Diffuse tenderness of the distal right lower extremity.  Good ROM, no contractures. Normal muscle tone.    Data Reviewed:  I have personally reviewed and interpreted labs, imaging.  Significant findings are   CBC: Recent Labs  Lab 06/18/23 0013  WBC 9.8  NEUTROABS 6.9  HGB 11.2*  HCT 36.9  MCV 101.9*  PLT 278   Basic Metabolic Panel: Recent Labs  Lab 06/18/23 0013 06/19/23 0449 06/20/23 0558 06/21/23 0405 06/22/23 0414  NA 141 141 140 137 138  K 4.2 4.1 3.5 3.8 4.1  CL 103 102 99 99 99  CO2 28 28 33* 29 29  GLUCOSE 134* 129* 146* 139* 145*  BUN 24* 22 23 23 23   CREATININE 0.92 0.92 1.07* 0.93 0.68   CALCIUM 9.0 9.0 8.5* 8.2* 8.3*  MG  --   --   --   --  1.9   GFR: Estimated Creatinine Clearance: 103 mL/min (by C-G formula based on SCr of 0.68 mg/dL). Liver Function Tests: Recent Labs  Lab 06/19/23 0449 06/22/23 0414  AST 14* 21  ALT 12 12  ALKPHOS 117 116  BILITOT 1.0 0.9  PROT 7.0 6.4*  ALBUMIN  3.4* 2.9*     Code Status:  Full code.  Code status decision has been confirmed with: patient    Severity of Illness:  The appropriate patient status for this patient is INPATIENT. Inpatient status is judged to be reasonable and necessary in order to provide the required intensity of service to ensure the patient's safety. The patient's presenting symptoms, physical exam findings, and initial radiographic and laboratory data in the context of their chronic comorbidities is felt to place them at high risk for further clinical deterioration. Furthermore, it is not anticipated that the patient will be medically stable for discharge from the hospital within 2 midnights of admission.   * I certify that at the point of admission it is my clinical judgment that the patient will require inpatient hospital care spanning beyond 2 midnights from the point of admission due to high intensity of service, high risk for further deterioration and high frequency of surveillance required.*  Time spent:  50 minutes  Author:  True Fuss MD  06/21/2023

## 2023-06-22 NOTE — Discharge Summary (Incomplete Revision)
 Physician Discharge Summary   Patient: Allison Short MRN: [id number] DOB: 04-17-1950  Admit date:     06/18/2023  Discharge date: 06/22/23  Discharge Physician: True Fuss   PCP: Alto Atta, NP   Recommendations at discharge:   Patient is full code  Patient may get out of bed to with assistance using an assistive device Patient to be ministered all prescribed medications exactly as instructed. Patient receives a low-sodium low carbohydrate diet Follow-up with facility provider per protocol.   Please bring patient back to the emergency department if he develops sudden is in mentation, shortness of breath,  fevers of greater than 100.4 F or weakness.   Patient may participate in physical therapy per the recommendations. Wound care instructions as follows:  Wound care  Daily     Comments: Cleanse B lower legs and dorsal feet (intact skin and ulcerations) with Vashe wound cleanser , do not rinse and allow to air dry. Apply silver hydrofiber   to foot and leg wounds daily (any weeping area), cover with ABD pad and secure with Kerlix roll gauze wrapped beginning just above toes and ending right below knees.  May apply Ace bandage wrapped in same fashion as Kerlix for light compression  Discharge Diagnoses: Principal Problem:   Acute on chronic heart failure with preserved ejection fraction (HFpEF) (HCC) Active Problems:   Cellulitis of lower extremity with chronic  lymphedema   Essential hypertension   OSA (obstructive sleep apnea)   Prediabetes  Resolved Problems:   * No resolved hospital problems. *   Hospital Course: 73 y.o. 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   Upon evaluation in the emergency department chest x-ray revealed bilateral pleural effusions.  Clinically the patient was additionally felt to be suffering from cellulitis of the bilateral lower extremities due to open wounds of the legs due to worsening edema.  Patient was therefore initiated on intravenous Lasix  and vancomycin .  Hospitalist group was then called to assess the patient for admission to hospital.   Patient was managed with intravenous Lasix  resulting in excellent diuresis throughout the hospitalization.  Associated electrolyte abnormalities were replaced.  Strict input and output monitoring was performed as well as daily weights.  Concurrently, patient's cellulitis was treated with intravenous vancomycin  which was later transitioned to oral doxycycline .  Wound care nurse team evaluated the patient's associated bilateral lower extremity wounds and daily dressing changes were recommended and performed daily.  Patient's shortness of breath swelling and bilateral lower extremity pain all gradually improved throughout the hospitalization.  Patient was evaluated by physical therapy during this hospitalization and they recommended the patient would benefit from skilled services in a skilled nursing facility.  Arranges were made for the patient to be discharged to [facility] skilled nursing facility on 06/22/2023 in improved and stable condition.     Pain control - [place]  Controlled Substance Reporting System database was reviewed. and patient was instructed, not to drive, operate heavy machinery, perform activities at heights, swimming or participation in water activities or provide baby-sitting services while on Pain, Sleep and Anxiety Medications; until their outpatient Physician has advised to do so again. Also recommended to not to take more than prescribed Pain, Sleep and Anxiety Medications.   Consultants: none Procedures  performed: none  Disposition: Skilled nursing facility Diet recommendation:  Discharge Diet Orders (From admission, onward)     Start     Ordered   06/22/23 0000  Diet - low sodium heart healthy        06/22/23 1427           Cardiac and Carb modified diet  DISCHARGE MEDICATION: Allergies as of 06/22/2023       Reactions   Ciprofloxin Hcl [ciprofloxacin] Other (See Comments)   Unstable gait   Morphine And Codeine Anaphylaxis, Other (See Comments)   Tolerates hydrocodone    Asa [aspirin] Hives, Other (See Comments)   Hives can become SEVERE, requiring intervention   Chia Oil Swelling, Other (See Comments)   Possibly "chia tea" = Face and lips became swollen   Erythromycin Other (See Comments)   Reaction not recalled   Nsaids Other (See Comments)   Lowers GFR   Penicillins Other (See Comments)   Reaction not recalled- was told to "never take this again" by family        Medication List     TAKE these medications    acetaminophen  500 MG tablet Commonly known as: TYLENOL  Take 1,000 mg by mouth every 8 (eight) hours as needed for mild pain, moderate pain or headache.   bisacodyl  5 MG EC tablet Generic drug: bisacodyl  Take 5 mg by mouth daily as needed for moderate constipation.   cyanocobalamin  1000 MCG tablet Commonly known as: VITAMIN B12 Take 1 tablet (1,000 mcg total) by mouth daily.   doxycycline  100 MG tablet Commonly known as: VIBRA -TABS Take 1 tablet (100 mg total) by mouth every 12 (twelve) hours for 5 doses.   furosemide  20 MG tablet Commonly known as: LASIX  Take 1 tablet (20 mg total) by mouth daily.   hydrOXYzine  25 MG tablet Commonly known as: ATARAX  Take 1 tablet (25 mg total) by mouth at bedtime as needed for anxiety.   metFORMIN  500 MG tablet Commonly known as: GLUCOPHAGE  Take 1 tablet (500 mg total) by mouth 2 (two) times daily with a meal.   metoprolol  tartrate 50 MG tablet Commonly known as: LOPRESSOR  Take 1 tablet (50 mg total) by  mouth 2 (two) times daily.   potassium chloride  10 MEQ tablet Commonly known as: KLOR-CON  M Take 1 tablet (10 mEq total) by mouth daily.   sodium chloride  0.65 % Soln nasal spray Commonly known as: OCEAN Place 1 spray into both nostrils as needed for congestion.   Vitamin D3 1000 units Caps Take 1,000 Units by mouth daily.               Discharge Care Instructions  (From admission, onward)           Start     Ordered   06/22/23 0000  Discharge wound care:       Comments: Wound care  Daily     Comments: Cleanse B lower legs and dorsal feet (intact skin and ulcerations) with Vashe wound cleanser , do not rinse and allow to air dry. Apply silver hydrofiber   to foot and leg wounds daily (any weeping area), cover with ABD pad and secure with Kerlix roll gauze wrapped beginning just above toes and ending right below knees.  May apply Ace bandage wrapped in same fashion as Kerlix for light compression   06/22/23 1427            Contact information for after-discharge care     Destination     [facility] Preferred SNF .   Service: Skilled Nursing Contact information: [address] [place]  [id number] [phone]                     Discharge Exam: Filed Weights   06/19/23 1240 06/20/23 0400 06/22/23 0500  Weight: (!) 169.9 kg (!) 165.9 kg (!) 168.6 kg    Constitutional: Awake alert and oriented x3, no associated distress.  Patient is obese. Respiratory: clear to auscultation bilaterally, no wheezing, no crackles. Normal respiratory effort. No accessory muscle use.  Cardiovascular: Regular rate and rhythm, no murmurs / rubs / gallops. No extremity edema. 2+ pedal pulses. No carotid bruits.  Abdomen: Abdomen is protuberant but soft and nontender.  No evidence of intra-abdominal masses.  Positive bowel sounds noted in all quadrants.   Musculoskeletal: No joint deformity upper and lower extremities. Good ROM,  no contractures. Normal muscle tone.     Condition at discharge: fair  The results of significant diagnostics from this hospitalization (including imaging, microbiology, ancillary and laboratory) are listed below for reference.   Imaging Studies: ECHOCARDIOGRAM COMPLETE Result Date: 06/19/2023    ECHOCARDIOGRAM REPORT   Patient Name:   MONALEE THACKERAY Date of Exam: 06/19/2023 Medical Rec #:  [id number]  Height:       68.0 in Accession #:    [id number] Weight:       374.6 lb Date of Birth:  Aug 03, 1950   BSA:          2.668 m Patient Age:    72 years   BP:           153/70 mmHg Patient Gender: F          HR:           84 bpm. Exam Location:  Inpatient Procedure: 2D Echo, Cardiac Doppler, Color Doppler and Intracardiac            Opacification Agent (Both Spectral and Color Flow Doppler were            utilized during procedure). Indications:    CHF-acute diastolic  History:        Patient has prior history of Echocardiogram examinations, most                 recent 04/28/2022. Risk Factors:Hypertension and Sleep Apnea.  Sonographer:    Juanita Shaw Referring Phys: [id number] RJJOACZYS RATHORE  Sonographer Comments: Technically difficult study due to poor echo windows, suboptimal apical window, suboptimal parasternal window and suboptimal subcostal window. Image acquisition challenging due to patient body habitus. IMPRESSIONS  1. Left ventricular ejection fraction, by estimation, is 50 to 55%. The left ventricle has low normal function. The left ventricle demonstrates global hypokinesis. Left ventricular diastolic parameters are indeterminate.  2. Right ventricular systolic function is normal. The right ventricular size is visually enlarged.  3. The mitral valve is degenerative. No evidence of mitral valve regurgitation. No evidence of mitral stenosis.  4. The aortic valve is grossly normal. Aortic valve regurgitation is not visualized. Aortic valve sclerosis is present, with no evidence of aortic valve stenosis.  5. The  inferior vena cava is dilated in size with <50% respiratory variability, suggesting right atrial pressure of 15 mmHg. FINDINGS  Left Ventricle: Left ventricular ejection fraction, by estimation, is 50 to 55%. The left ventricle has low normal function. The left ventricle demonstrates global hypokinesis. The left ventricular internal cavity size was normal in size. There is no left ventricular hypertrophy. Left ventricular diastolic parameters  are indeterminate. Right Ventricle: The right ventricular size is visually enlarged. No increase in right ventricular wall thickness. Right ventricular systolic function is normal. Left Atrium: Left atrial size was normal in size. Right Atrium: Right atrial size was normal in size. Pericardium: There is no evidence of pericardial effusion. Mitral Valve: The mitral valve is degenerative in appearance. No evidence of mitral valve regurgitation. No evidence of mitral valve stenosis. MV peak gradient, 4.2 mmHg. The mean mitral valve gradient is 2.0 mmHg. Tricuspid Valve: The tricuspid valve is normal in structure. Tricuspid valve regurgitation is trivial. No evidence of tricuspid stenosis. Aortic Valve: The aortic valve is grossly normal. Aortic valve regurgitation is not visualized. Aortic valve sclerosis is present, with no evidence of aortic valve stenosis. Aortic valve mean gradient measures 7.0 mmHg. Aortic valve peak gradient measures 11.7 mmHg. Aortic valve area, by VTI measures 2.08 cm. Pulmonic Valve: The pulmonic valve was not well visualized. Pulmonic valve regurgitation is trivial. No evidence of pulmonic stenosis. Aorta: The aortic root and ascending aorta are structurally normal, with no evidence of dilitation. Venous: The inferior vena cava is dilated in size with less than 50% respiratory variability, suggesting right atrial pressure of 15 mmHg. IAS/Shunts: The interatrial septum was not well visualized.  LEFT VENTRICLE PLAX 2D LVIDd:         5.20 cm       Diastology LVIDs:         3.60 cm      LV e' medial:    5.77 cm/s LV PW:         0.90 cm      LV E/e' medial:  15.9 LV IVS:        0.90 cm      LV e' lateral:   9.46 cm/s LVOT diam:     2.20 cm      LV E/e' lateral: 9.7 LV SV:         70 LV SV Index:   26 LVOT Area:     3.80 cm  LV Volumes (MOD) LV vol d, MOD A4C: 154.0 ml LV vol s, MOD A4C: 73.3 ml LV SV MOD A4C:     154.0 ml RIGHT VENTRICLE             IVC RV S prime:     11.10 cm/s  IVC diam: 2.30 cm TAPSE (M-mode): 2.5 cm LEFT ATRIUM             Index        RIGHT ATRIUM           Index LA diam:        4.30 cm 1.61 cm/m   RA Area:     14.40 cm LA Vol (A2C):   86.0 ml 32.23 ml/m  RA Volume:   35.10 ml  13.15 ml/m LA Vol (A4C):   67.0 ml 25.11 ml/m LA Biplane Vol: 79.2 ml 29.68 ml/m  AORTIC VALVE                     PULMONIC VALVE AV Area (Vmax):    2.02 cm      PV Vmax:       0.98 m/s AV Area (Vmean):   2.03 cm      PV Peak grad:  3.9 mmHg AV Area (VTI):     2.08 cm AV Vmax:           171.00 cm/s AV Vmean:  121.000 cm/s AV VTI:            0.335 m AV Peak Grad:      11.7 mmHg AV Mean Grad:      7.0 mmHg LVOT Vmax:         90.80 cm/s LVOT Vmean:        64.700 cm/s LVOT VTI:          0.183 m LVOT/AV VTI ratio: 0.55  AORTA Ao Root diam: 2.80 cm Ao Asc diam:  3.10 cm MITRAL VALVE MV Area (PHT): 3.93 cm    SHUNTS MV Area VTI:   3.00 cm    Systemic VTI:  0.18 m MV Peak grad:  4.2 mmHg    Systemic Diam: 2.20 cm MV Mean grad:  2.0 mmHg MV Vmax:       1.03 m/s MV Vmean:      64.8 cm/s MV E velocity: 91.90 cm/s MV A velocity: 70.00 cm/s MV E/A ratio:  1.31 Sunit Tolia Electronically signed by Olinda Bertrand Signature Date/Time: 06/19/2023/3:33:21 PM    Final    DG Chest 2 View Result Date: 06/18/2023 CLINICAL DATA:  Fluid overload EXAM: CHEST - 2 VIEW COMPARISON:  None Available. FINDINGS: Lungs are clear. No pneumothorax. Small bilateral pleural effusions are present. Cardiac size is enlarged unchanged. Pulmonary vascularity is. Osseous structures are  age-appropriate. No acute bone abnormality. IMPRESSION: 1. Small bilateral pleural effusions. 2. Stable cardiomegaly. Electronically Signed   By: Worthy Heads M.D.   On: 06/18/2023 23:48    Microbiology: Results for orders placed or performed during the hospital encounter of 04/08/22  MRSA Next Gen by PCR, Nasal     Status: None   Collection Time: 04/13/22  3:36 PM   Specimen: Nasal Mucosa; Nasal Swab  Result Value Ref Range Status   MRSA by PCR Next Gen NOT DETECTED NOT DETECTED Final    Comment: (NOTE) The GeneXpert MRSA Assay (FDA approved for NASAL specimens only), is one component of a comprehensive MRSA colonization surveillance program. It is not intended to diagnose MRSA infection nor to guide or monitor treatment for MRSA infections. Test performance is not FDA approved in patients less than 50 years old. Performed at [facility], [address], [place], [place] [address]     Labs: CBC: Recent Labs  Lab 06/18/23 0013  WBC 9.8  NEUTROABS 6.9  HGB 11.2*  HCT 36.9  MCV 101.9*  PLT 278   Basic Metabolic Panel: Recent Labs  Lab 06/18/23 0013 06/19/23 0449 06/20/23 0558 06/21/23 0405 06/22/23 0414  NA 141 141 140 137 138  K 4.2 4.1 3.5 3.8 4.1  CL 103 102 99 99 99  CO2 28 28 33* 29 29  GLUCOSE 134* 129* 146* 139* 145*  BUN 24* 22 23 23 23   CREATININE 0.92 0.92 1.07* 0.93 0.68  CALCIUM 9.0 9.0 8.5* 8.2* 8.3*  MG  --   --   --   --  1.9   Liver Function Tests: Recent Labs  Lab 06/19/23 0449 06/22/23 0414  AST 14* 21  ALT 12 12  ALKPHOS 117 116  BILITOT 1.0 0.9  PROT 7.0 6.4*  ALBUMIN  3.4* 2.9*   CBG: No results for input(s): "GLUCAP" in the last 168 hours.  Discharge time spent: greater than 30 minutes.  Signed: True Fuss, MD [facility] 06/22/2023

## 2023-06-22 NOTE — Assessment & Plan Note (Signed)
 Counseling on increasing physical activity and caloric restriction

## 2023-06-22 NOTE — TOC CM/SW Note (Addendum)
 Medical necessity form done; [facility] scheduled. Discharge summary, discharge orders, and SNF transfer report faxed to facility in hub.  Addendum: Discharge has been cancelled. CSW called [facility] to cancel transportation.

## 2023-06-23 DIAGNOSIS — R531 Weakness: Secondary | ICD-10-CM

## 2023-06-23 DIAGNOSIS — I5033 Acute on chronic diastolic (congestive) heart failure: Secondary | ICD-10-CM | POA: Diagnosis not present

## 2023-06-23 DIAGNOSIS — I890 Lymphedema, not elsewhere classified: Secondary | ICD-10-CM | POA: Diagnosis not present

## 2023-06-23 DIAGNOSIS — L03119 Cellulitis of unspecified part of limb: Secondary | ICD-10-CM | POA: Diagnosis not present

## 2023-06-23 DIAGNOSIS — E66813 Obesity, class 3: Secondary | ICD-10-CM | POA: Diagnosis not present

## 2023-06-23 LAB — CBC WITH DIFFERENTIAL/PLATELET
Abs Immature Granulocytes: 0.02 10*3/uL (ref 0.00–0.07)
Basophils Absolute: 0 10*3/uL (ref 0.0–0.1)
Basophils Relative: 1 %
Eosinophils Absolute: 0.2 10*3/uL (ref 0.0–0.5)
Eosinophils Relative: 4 %
HCT: 48.5 % — ABNORMAL HIGH (ref 36.0–46.0)
Hemoglobin: 14.5 g/dL (ref 12.0–15.0)
Immature Granulocytes: 0 %
Lymphocytes Relative: 16 %
Lymphs Abs: 1.1 10*3/uL (ref 0.7–4.0)
MCH: 31.3 pg (ref 26.0–34.0)
MCHC: 29.9 g/dL — ABNORMAL LOW (ref 30.0–36.0)
MCV: 104.5 fL — ABNORMAL HIGH (ref 80.0–100.0)
Monocytes Absolute: 0.5 10*3/uL (ref 0.1–1.0)
Monocytes Relative: 8 %
Neutro Abs: 4.6 10*3/uL (ref 1.7–7.7)
Neutrophils Relative %: 71 %
Platelets: 211 10*3/uL (ref 150–400)
RBC: 4.64 MIL/uL (ref 3.87–5.11)
RDW: 16.1 % — ABNORMAL HIGH (ref 11.5–15.5)
WBC: 6.5 10*3/uL (ref 4.0–10.5)
nRBC: 0 % (ref 0.0–0.2)

## 2023-06-23 LAB — COMPREHENSIVE METABOLIC PANEL WITH GFR
ALT: 18 U/L (ref 0–44)
AST: 19 U/L (ref 15–41)
Albumin: 3.2 g/dL — ABNORMAL LOW (ref 3.5–5.0)
Alkaline Phosphatase: 135 U/L — ABNORMAL HIGH (ref 38–126)
Anion gap: 11 (ref 5–15)
BUN: 25 mg/dL — ABNORMAL HIGH (ref 8–23)
CO2: 30 mmol/L (ref 22–32)
Calcium: 8.8 mg/dL — ABNORMAL LOW (ref 8.9–10.3)
Chloride: 97 mmol/L — ABNORMAL LOW (ref 98–111)
Creatinine, Ser: 1.03 mg/dL — ABNORMAL HIGH (ref 0.44–1.00)
GFR, Estimated: 57 mL/min — ABNORMAL LOW (ref >60)
Glucose, Bld: 138 mg/dL — ABNORMAL HIGH (ref 70–99)
Potassium: 4.3 mmol/L (ref 3.5–5.1)
Sodium: 138 mmol/L (ref 135–145)
Total Bilirubin: 0.8 mg/dL (ref 0.0–1.2)
Total Protein: 7 g/dL (ref 6.5–8.1)

## 2023-06-23 LAB — MAGNESIUM: Magnesium: 1.8 mg/dL (ref 1.7–2.4)

## 2023-06-23 MED ORDER — SALINE SPRAY 0.65 % NA SOLN
1.0000 | NASAL | Status: DC | PRN
  Filled 2023-06-23: qty 44

## 2023-06-23 MED ORDER — CEPHALEXIN 500 MG PO CAPS
500.0000 mg | ORAL_CAPSULE | Freq: Four times a day (QID) | ORAL | Status: DC
  Administered 2023-06-24 – 2023-06-27 (×15): 500 mg via ORAL
  Filled 2023-06-23 (×15): qty 1

## 2023-06-23 MED ORDER — CARMEX CLASSIC LIP BALM EX OINT
TOPICAL_OINTMENT | CUTANEOUS | Status: DC | PRN
  Filled 2023-06-23 (×2): qty 10

## 2023-06-23 MED ORDER — ALBUTEROL SULFATE (2.5 MG/3ML) 0.083% IN NEBU
2.5000 mg | INHALATION_SOLUTION | Freq: Two times a day (BID) | RESPIRATORY_TRACT | Status: DC
  Administered 2023-06-23 – 2023-06-27 (×8): 2.5 mg via RESPIRATORY_TRACT
  Filled 2023-06-23 (×9): qty 3

## 2023-06-23 NOTE — Assessment & Plan Note (Signed)
 Secondary to multifactorial edema of the bilateral lower extremities leading to skin breakdown and ulcer formation. Wound care consultation was obtained Daily dressing changes per their recommendations.

## 2023-06-23 NOTE — Progress Notes (Signed)
 PROGRESS NOTE   Allison Short  JXB:[id number] DOB: 1950/05/06 DOA: 06/18/2023 PCP: [place], NP   Date of Service: the patient was seen and examined on 06/23/2023  Brief Narrative:  73 y.o. female past medical history significant for essential hypertension, morbid obesity, obstructive sleep apnea/obesity hypoventilation syndrome on CPAP, chronic hypoxic and hypercapnic respiratory failure, non-Hodgkin's lymphoma in remission chronic lymphedema B12 deficiency who presents to [facility] emergency department with bilateral lower extremity edema much worse than her baseline lymphedema.   Upon evaluation in the emergency department chest x-ray revealed bilateral pleural effusions.  Clinically the patient was additionally felt to be suffering from cellulitis of the bilateral lower extremities due to open wounds of the legs due to worsening edema.  Patient was therefore initiated on intravenous Lasix  and vancomycin .  Hospitalist group was then called to assess the patient for admission to hospital.   Patient was managed with intravenous Lasix  resulting in excellent diuresis throughout the hospitalization.  Associated electrolyte abnormalities were replaced.  Strict input and output monitoring was performed as well as daily weights.  Concurrently, patient's cellulitis was treated with intravenous vancomycin  which was later transitioned to oral doxycycline .  Wound care nurse team evaluated the patient's associated bilateral lower extremity wounds and daily dressing changes were recommended and performed daily.  Patient's shortness of breath swelling and bilateral lower extremity pain all gradually improved throughout the hospitalization.  Patient was evaluated by physical therapy during this hospitalization and they recommended the patient would benefit from skilled services in a skilled nursing facility.    Assessment & Plan Acute on chronic heart failure with preserved ejection fraction  (HFpEF) (HCC) Increased urine output on current regimen of Lasix   Patient reports nearly 70 pound weight gain since 01/2023. Will continue Lasix  dosing to 40 mg IV twice daily Daily weights Strict input and output monitoring Monitoring renal function and electrolytes with serial chemistries Cellulitis of lower extremity with chronic  lymphedema Adding Keflex to doxycycline  for better Streptococcus coverage. Continuing dressing changes considering concurrent bilateral lower extremity ulcerations. Etiology seems to be secondary to skin breakdown in the setting of poorly managed lymphedema exacerbated by acute cardiogenic volume overload Essential hypertension Continue metoprolol  OSA (obstructive sleep apnea) CPAP nightly with sleep Prediabetes On metformin  twice daily in the outpatient setting Last hemoglobin A1c 5.9% 4/28 Class 3 severe obesity due to excess calories with serious comorbidity and body mass index (BMI) of 50.0 to 59.9 in adult Counseling on increasing physical activity and caloric restriction Lymphedema of both lower extremities Severe bilateral lower extremity lymphedema but seems to have been exacerbated by cardiogenic volume overload Attempting to reduce degree of swelling with intravenous diuretics, will also need close outpatient follow-up with the lymphedema clinic after discharge. Ulcers of both lower legs (HCC) Secondary to multifactorial edema of the bilateral lower extremities leading to skin breakdown and ulcer formation. Wound care consultation was obtained Daily dressing changes per their recommendations. Generalized weakness Patient would benefit from skilled physical therapy services in a skilled nursing facility once clinically improved.     Subjective:  Continued improvement in bilateral lower extremity edema and right lower extremity pain.  Patient denies shortness of breath.  Physical Exam:     06/23/2023   10:05 AM 06/23/2023    6:20 AM 06/23/2023     5:00 AM  Vitals with BMI  Weight   361 lbs 12 oz  BMI   55.02  Systolic 145 145   Diastolic 79 79   Pulse 86 86  Constitutional: Awake alert and oriented x3, no associated distress.  Patient is obese. Skin: Dressings of the bilateral lower extremities are clean dry and intact.  Patient exhibits extremely dry hyperkeratinized skin of the anterior surfaces of the bilateral lower extremities.   Eyes: Pupils are equally reactive to light.  No evidence of scleral icterus or conjunctival pallor.  ENMT: Moist mucous membranes noted.  Posterior pharynx clear of any exudate or lesions.   Respiratory: Diminished breath sounds at the bases with bibasilar rales.  Normal respiratory effort. No accessory muscle use.  Cardiovascular: Improving jugular venous pulse.  Regular rate and rhythm, no murmurs / rubs / gallops.  Extensive bilateral lower extremity pitting edema that tracks from the feet all the way up to the thighs bilaterally.  2+ pedal pulses. No carotid bruits.  Abdomen: Abdomen is protuberant but soft and nontender.  No evidence of intra-abdominal masses.  Positive bowel sounds noted in all quadrants.   Musculoskeletal: Diffuse tenderness of the distal right lower extremity, albeit improved.  Good ROM, no contractures. Normal muscle tone.    Data Reviewed:  I have personally reviewed and interpreted labs, imaging.  Significant findings are   CBC: Recent Labs  Lab 06/18/23 0013 06/23/23 0348  WBC 9.8 6.5  NEUTROABS 6.9 4.6  HGB 11.2* 14.5  HCT 36.9 48.5*  MCV 101.9* 104.5*  PLT 278 211   Basic Metabolic Panel: Recent Labs  Lab 06/19/23 0449 06/20/23 0558 06/21/23 0405 06/22/23 0414 06/23/23 0624  NA 141 140 137 138 138  K 4.1 3.5 3.8 4.1 4.3  CL 102 99 99 99 97*  CO2 28 33* 29 29 30   GLUCOSE 129* 146* 139* 145* 138*  BUN 22 23 23 23  25*  CREATININE 0.92 1.07* 0.93 0.68 1.03*  CALCIUM 9.0 8.5* 8.2* 8.3* 8.8*  MG  --   --   --  1.9 1.8   GFR: Estimated  Creatinine Clearance: 78.7 mL/min (A) (by C-G formula based on SCr of 1.03 mg/dL (H)). Liver Function Tests: Recent Labs  Lab 06/19/23 0449 06/22/23 0414 06/23/23 0624  AST 14* 21 19  ALT 12 12 18   ALKPHOS 117 116 135*  BILITOT 1.0 0.9 0.8  PROT 7.0 6.4* 7.0  ALBUMIN  3.4* 2.9* 3.2*     Code Status:  Full code.  Code status decision has been confirmed with: patient    Severity of Illness:  The appropriate patient status for this patient is INPATIENT. Inpatient status is judged to be reasonable and necessary in order to provide the required intensity of service to ensure the patient's safety. The patient's presenting symptoms, physical exam findings, and initial radiographic and laboratory data in the context of their chronic comorbidities is felt to place them at high risk for further clinical deterioration. Furthermore, it is not anticipated that the patient will be medically stable for discharge from the hospital within 2 midnights of admission.   * I certify that at the point of admission it is my clinical judgment that the patient will require inpatient hospital care spanning beyond 2 midnights from the point of admission due to high intensity of service, high risk for further deterioration and high frequency of surveillance required.*  Time spent:  44 minutes  Author:  True Fuss MD  06/23/2023

## 2023-06-23 NOTE — TOC Progression Note (Signed)
 Transition of Care [facility]) - Progression Note    Patient Details  Name: Allison Short MRN: [id number] Date of Birth: December 26, 1950  [facility] CM/SW Contact  Gertha Ku, LCSW Phone Number: 06/23/2023, 2:08 PM  Clinical Narrative:    Pt's insurance authorization expires tomorrow. A new authorization will be required if the pt is not admitted to the facility by 11:59 p.m. tomorrow. CSW spoke with [place] from [facility], who stated they can admit the pt this weekend if medically stable. TOC to follow.  Expected Discharge Plan: Skilled Nursing Facility Barriers to Discharge: Continued Medical Work up  Expected Discharge Plan and Services       Living arrangements for the past 2 months: Apartment Expected Discharge Date: 06/22/23                                     Social Determinants of Health (SDOH) Interventions SDOH Screenings   Food Insecurity: No Food Insecurity (06/19/2023)  Housing: Low Risk  (06/19/2023)  Transportation Needs: Unmet Transportation Needs (06/22/2023)  Utilities: Not At Risk (06/19/2023)  Alcohol Screen: Low Risk  (05/29/2023)  Depression (PHQ2-9): Low Risk  (05/29/2023)  Financial Resource Strain: Low Risk  (05/29/2023)  Physical Activity: Inactive (05/29/2023)  Social Connections: Moderately Isolated (06/19/2023)  Stress: No Stress Concern Present (05/29/2023)  Tobacco Use: Low Risk  (06/18/2023)  Health Literacy: Adequate Health Literacy (05/29/2023)    Readmission Risk Interventions     No data to display

## 2023-06-23 NOTE — Assessment & Plan Note (Signed)
 Severe bilateral lower extremity lymphedema but seems to have been exacerbated by cardiogenic volume overload Attempting to reduce degree of swelling with intravenous diuretics, will also need close outpatient follow-up with the lymphedema clinic after discharge.

## 2023-06-23 NOTE — Assessment & Plan Note (Signed)
 Adding Keflex to doxycycline  for better Streptococcus coverage. Continuing dressing changes considering concurrent bilateral lower extremity ulcerations. Etiology seems to be secondary to skin breakdown in the setting of poorly managed lymphedema exacerbated by acute cardiogenic volume overload

## 2023-06-23 NOTE — Assessment & Plan Note (Signed)
?   CPAP nightly with sleep ?

## 2023-06-23 NOTE — Assessment & Plan Note (Signed)
 Counseling on increasing physical activity and caloric restriction

## 2023-06-23 NOTE — Assessment & Plan Note (Signed)
 On metformin  twice daily in the outpatient setting Last hemoglobin A1c 5.9% 4/28

## 2023-06-23 NOTE — Assessment & Plan Note (Signed)
 Continue metoprolol.

## 2023-06-23 NOTE — Progress Notes (Signed)
 Occupational Therapy Treatment Patient Details Name: Allison Short MRN: [id number] DOB: 1950/04/13 Today's Date: 06/23/2023   History of present illness 73 yr old female admitted with acute on chronic HF, LE celluliltis. Hx of lymphedema, chronic LE edema, CHF, OSA, morbid obesity, NHL, B TKA   OT comments  The pt was seen for facilitation of progressive ADL participation and general functional strengthening. She was assisted into sitting edge of bed, where she then reported feelings of moderate dizziness. Her blood pressure was taken and noted to be 127/69. She then performed upper body grooming with set-up assist seated EOB. Pt was instructed on sit to stand from EOB using a RW. She was unable to clear her buttocks off the bed on the first two standing attempts. She achieved standing on the third attempt, requiring instruction on weight shifting forward and pushing with B LE. Once in standing, she required min assist to take ~4 lateral steps towards the head of the bed; R knee pain reported with standing activity. She was assisted back to supine at the end of the session. Continue OT plan of care. Patient will benefit from continued inpatient follow up therapy, <3 hours/day.         If plan is discharge home, recommend the following:  A lot of help with walking and/or transfers;A lot of help with bathing/dressing/bathroom;Assistance with cooking/housework;Assist for transportation   Equipment Recommendations  Other (comment) (defer to next level of care)    Recommendations for Other Services      Precautions / Restrictions Precautions Precautions: Fall Restrictions Weight Bearing Restrictions Per Provider Order: No Other Position/Activity Restrictions: chronic lymphedema       Mobility Bed Mobility Overal bed mobility: Needs Assistance Bed Mobility: Supine to Sit, Sit to Supine, Rolling Rolling: Mod assist   Supine to sit: Min assist, Used rails, HOB elevated Sit to supine: Mod  assist (required assist for B LE back onto bed)        Transfers Overall transfer level: Needs assistance Equipment used: Rolling walker (2 wheels) Transfers: Sit to/from Stand Sit to Stand: Mod assist, +2 physical assistance, From elevated surface           General transfer comment: Pt was instructed on sit to stand from EOB using a RW. She was unable to clear her buttocks off the bed on the first two standing attempts. She achieved standing on the third attempt, requiring inctruction on weight shifting forward and pushing with B LE. Once in standing, she required min assist to take ~4 lateral steps towards the head of the bed.     Balance       Sitting balance - Comments: static sitting-good. dynamic sitting-fair+     Standing balance-Leahy Scale: Poor          ADL either performed or assessed with clinical judgement   ADL       Grooming: Set up;Wash/dry face;Wash/dry hands;Supervision/safety Grooming Details (indicate cue type and reason): The pt was assisted into sitting edge of bed where she performed upper body grooming with set-up assist, specifically face washing, hand washing, and teeth brushing.         Upper Body Dressing : Minimal assistance;Sitting Upper Body Dressing Details (indicate cue type and reason): She doffed then donned a hospital gown seated edge of bed. Lower Body Dressing: Maximal assistance;Sitting/lateral leans                       Vision Baseline Vision/History: 1 Wears glasses  Communication Communication Communication: No apparent difficulties   Cognition Arousal: Alert Behavior During Therapy: WFL for tasks assessed/performed          Following commands: Intact                      Pertinent Vitals/ Pain       Pain Assessment Pain Assessment: No/denies pain   Frequency  Min 2X/week        Progress Toward Goals  OT Goals(current goals can now be found in the care plan section)   Progress towards OT goals: Progressing toward goals  Acute Rehab OT Goals Patient Stated Goal: to get better and return home OT Goal Formulation: With patient Time For Goal Achievement: 07/04/23 Potential to Achieve Goals: Good  Plan         AM-PAC OT "6 Clicks" Daily Activity     Outcome Measure   Help from another person eating meals?: None Help from another person taking care of personal grooming?: A Little Help from another person toileting, which includes using toliet, bedpan, or urinal?: A Lot Help from another person bathing (including washing, rinsing, drying)?: A Lot Help from another person to put on and taking off regular upper body clothing?: A Little Help from another person to put on and taking off regular lower body clothing?: A Lot 6 Click Score: 16    End of Session Equipment Utilized During Treatment: Rolling walker (2 wheels);Oxygen  OT Visit Diagnosis: Muscle weakness (generalized) (M62.81);Unsteadiness on feet (R26.81)   Activity Tolerance Patient limited by fatigue   Patient Left in bed;with call bell/phone within reach;with bed alarm set   Nurse Communication Mobility status        Time: 1610-9604 OT Time Calculation (min): 32 min  Charges: OT General Charges $OT Visit: 1 Visit OT Treatments $Self Care/Home Management : 8-22 mins $Therapeutic Activity: 8-22 mins    Sheralyn Dies, OTR/L 06/23/2023, 12:11 PM

## 2023-06-23 NOTE — Assessment & Plan Note (Signed)
 Patient would benefit from skilled physical therapy services in a skilled nursing facility once clinically improved.

## 2023-06-23 NOTE — Progress Notes (Signed)
 Physical Therapy Treatment Patient Details Name: Allison Short MRN: [id number] DOB: [birth year] Today's Date: 06/23/2023   History of Present Illness 73 yo female admitted with acute on chronic HF, LE celluliltis. Hx of lymphedema, chronic LE edema, CHF, OSA, morbid obesity, NHL, B TKA    PT Comments  Pt with gradual improved but does continue to need assist of 2 for OOB activity and limited endurance due to knee pain. Will cont POC.  Patient will benefit from continued inpatient follow up therapy, <3 hours/day     If plan is discharge home, recommend the following: A lot of help with walking and/or transfers;A lot of help with bathing/dressing/bathroom;Assistance with cooking/housework;Assist for transportation;Help with stairs or ramp for entrance   Can travel by private vehicle     No  Equipment Recommendations  None recommended by PT    Recommendations for Other Services       Precautions / Restrictions Precautions Precautions: Fall     Mobility  Bed Mobility Overal bed mobility: Needs Assistance Bed Mobility: Supine to Sit, Sit to Supine     Supine to sit: HOB elevated, Used rails, Contact guard Sit to supine: Min assist   General bed mobility comments: CGA to sit at EOB with use of momentum and bed features.  Assist for bil LE back to bed    Transfers Overall transfer level: Needs assistance Equipment used: Rolling walker (2 wheels) Transfers: Sit to/from Stand Sit to Stand: Mod assist, +2 physical assistance, From elevated surface           General transfer comment: Requried 2 attempts and use of momentum with heavy mod A x 2 to stand.  Once standing able to support self with RW    Ambulation/Gait Ambulation/Gait assistance: Min assist, +2 physical assistance, +2 safety/equipment Gait Distance (Feet): 4 Feet Assistive device: Rolling walker (2 wheels) Gait Pattern/deviations: Step-to pattern, Decreased stride length, Antalgic Gait velocity: decreased      General Gait Details: Side steps along bedside with use of RW. Cues for safety, technique. Assist to stabilize pt and manage RW; limited by pain   Stairs             Wheelchair Mobility     Tilt Bed    Modified Rankin (Stroke Patients Only)       Balance Overall balance assessment: Needs assistance Sitting-balance support: No upper extremity supported Sitting balance-Leahy Scale: Good Sitting balance - Comments: Time and exercises at EOB limited due to bed rail painful behind pt's legs   Standing balance support: Bilateral upper extremity supported, During functional activity, Reliant on assistive device for balance Standing balance-Leahy Scale: Poor Standing balance comment: RW and CGA/min A                            Communication Communication Communication: No apparent difficulties  Cognition Arousal: Alert Behavior During Therapy: WFL for tasks assessed/performed   PT - Cognitive impairments: No apparent impairments                                Cueing    Exercises General Exercises - Lower Extremity Ankle Circles/Pumps: AROM, Both, 10 reps, Supine Quad Sets: AROM, Both, 10 reps, Supine Other Exercises Other Exercises: Attempted LAQ but bed rail behind legs is painful    General Comments        Pertinent Vitals/Pain Pain Assessment Pain Assessment: Faces Faces  Pain Scale: Hurts little more Pain Location: LEs (knees) Pain Descriptors / Indicators: Grimacing, Moaning Pain Intervention(s): Limited activity within patient's tolerance, Monitored during session, Repositioned    Home Living                          Prior Function            PT Goals (current goals can now be found in the care plan section) Progress towards PT goals: Progressing toward goals    Frequency    Min 2X/week      PT Plan      Co-evaluation              AM-PAC PT "6 Clicks" Mobility   Outcome Measure  Help needed  turning from your back to your side while in a flat bed without using bedrails?: A Lot Help needed moving from lying on your back to sitting on the side of a flat bed without using bedrails?: A Lot Help needed moving to and from a bed to a chair (including a wheelchair)?: A Lot Help needed standing up from a chair using your arms (e.g., wheelchair or bedside chair)?: A Lot Help needed to walk in hospital room?: Total Help needed climbing 3-5 steps with a railing? : Total 6 Click Score: 10    End of Session Equipment Utilized During Treatment: Gait belt Activity Tolerance: Patient tolerated treatment well;Patient limited by pain Patient left: in bed;with call bell/phone within reach;with bed alarm set Nurse Communication: Mobility status PT Visit Diagnosis: Muscle weakness (generalized) (M62.81);Pain;Difficulty in walking, not elsewhere classified (R26.2);History of falling (Z91.81)     Time: 1452-1510 PT Time Calculation (min) (ACUTE ONLY): 18 min  Charges:    $Therapeutic Activity: 8-22 mins PT General Charges $$ ACUTE PT VISIT: 1 Visit                     Cyd Dowse, PT Acute Rehab [facility] [phone]    Carolynn Citrin 06/23/2023, 3:53 PM

## 2023-06-23 NOTE — Assessment & Plan Note (Signed)
 Increased urine output on current regimen of Lasix   Patient reports nearly 70 pound weight gain since 01/2023. Will continue Lasix  dosing to 40 mg IV twice daily Daily weights Strict input and output monitoring Monitoring renal function and electrolytes with serial chemistries

## 2023-06-24 ENCOUNTER — Inpatient Hospital Stay (HOSPITAL_COMMUNITY)

## 2023-06-24 DIAGNOSIS — L03119 Cellulitis of unspecified part of limb: Secondary | ICD-10-CM | POA: Diagnosis not present

## 2023-06-24 DIAGNOSIS — I5033 Acute on chronic diastolic (congestive) heart failure: Secondary | ICD-10-CM | POA: Diagnosis not present

## 2023-06-24 DIAGNOSIS — E66813 Obesity, class 3: Secondary | ICD-10-CM | POA: Diagnosis not present

## 2023-06-24 DIAGNOSIS — I890 Lymphedema, not elsewhere classified: Secondary | ICD-10-CM | POA: Diagnosis not present

## 2023-06-24 LAB — COMPREHENSIVE METABOLIC PANEL WITH GFR
ALT: 16 U/L (ref 0–44)
AST: 19 U/L (ref 15–41)
Albumin: 3.1 g/dL — ABNORMAL LOW (ref 3.5–5.0)
Alkaline Phosphatase: 126 U/L (ref 38–126)
Anion gap: 7 (ref 5–15)
BUN: 28 mg/dL — ABNORMAL HIGH (ref 8–23)
CO2: 35 mmol/L — ABNORMAL HIGH (ref 22–32)
Calcium: 8.8 mg/dL — ABNORMAL LOW (ref 8.9–10.3)
Chloride: 97 mmol/L — ABNORMAL LOW (ref 98–111)
Creatinine, Ser: 1.04 mg/dL — ABNORMAL HIGH (ref 0.44–1.00)
GFR, Estimated: 56 mL/min — ABNORMAL LOW (ref >60)
Glucose, Bld: 144 mg/dL — ABNORMAL HIGH (ref 70–99)
Potassium: 4.3 mmol/L (ref 3.5–5.1)
Sodium: 139 mmol/L (ref 135–145)
Total Bilirubin: 0.7 mg/dL (ref 0.0–1.2)
Total Protein: 6.7 g/dL (ref 6.5–8.1)

## 2023-06-24 LAB — CBC WITH DIFFERENTIAL/PLATELET
Abs Immature Granulocytes: 0.02 10*3/uL (ref 0.00–0.07)
Basophils Absolute: 0 10*3/uL (ref 0.0–0.1)
Basophils Relative: 1 %
Eosinophils Absolute: 0.4 10*3/uL (ref 0.0–0.5)
Eosinophils Relative: 6 %
HCT: 37.1 % (ref 36.0–46.0)
Hemoglobin: 10.9 g/dL — ABNORMAL LOW (ref 12.0–15.0)
Immature Granulocytes: 0 %
Lymphocytes Relative: 24 %
Lymphs Abs: 1.5 10*3/uL (ref 0.7–4.0)
MCH: 30.9 pg (ref 26.0–34.0)
MCHC: 29.4 g/dL — ABNORMAL LOW (ref 30.0–36.0)
MCV: 105.1 fL — ABNORMAL HIGH (ref 80.0–100.0)
Monocytes Absolute: 0.6 10*3/uL (ref 0.1–1.0)
Monocytes Relative: 10 %
Neutro Abs: 3.5 10*3/uL (ref 1.7–7.7)
Neutrophils Relative %: 59 %
Platelets: 252 10*3/uL (ref 150–400)
RBC: 3.53 MIL/uL — ABNORMAL LOW (ref 3.87–5.11)
RDW: 16 % — ABNORMAL HIGH (ref 11.5–15.5)
WBC: 6 10*3/uL (ref 4.0–10.5)
nRBC: 0 % (ref 0.0–0.2)

## 2023-06-24 LAB — GLUCOSE, CAPILLARY: Glucose-Capillary: 162 mg/dL — ABNORMAL HIGH (ref 70–99)

## 2023-06-24 LAB — BRAIN NATRIURETIC PEPTIDE: B Natriuretic Peptide: 409.7 pg/mL — ABNORMAL HIGH (ref 0.0–100.0)

## 2023-06-24 LAB — MAGNESIUM: Magnesium: 1.8 mg/dL (ref 1.7–2.4)

## 2023-06-24 MED ORDER — GUAIFENESIN-DM 100-10 MG/5ML PO SYRP
5.0000 mL | ORAL_SOLUTION | ORAL | Status: DC | PRN
  Administered 2023-06-24 – 2023-06-25 (×3): 5 mL via ORAL
  Filled 2023-06-24 (×3): qty 10

## 2023-06-24 MED ORDER — INSULIN ASPART 100 UNIT/ML IJ SOLN
0.0000 [IU] | Freq: Three times a day (TID) | INTRAMUSCULAR | Status: DC
  Administered 2023-06-24 – 2023-06-25 (×2): 2 [IU] via SUBCUTANEOUS

## 2023-06-24 MED ORDER — METFORMIN HCL 500 MG PO TABS
500.0000 mg | ORAL_TABLET | Freq: Two times a day (BID) | ORAL | Status: DC
  Administered 2023-06-24 – 2023-06-27 (×7): 500 mg via ORAL
  Filled 2023-06-24 (×7): qty 1

## 2023-06-24 MED ORDER — ENOXAPARIN SODIUM 60 MG/0.6ML IJ SOSY
55.0000 mg | PREFILLED_SYRINGE | INTRAMUSCULAR | Status: DC
  Administered 2023-06-24: 55 mg via SUBCUTANEOUS
  Filled 2023-06-24: qty 0.6

## 2023-06-24 NOTE — Assessment & Plan Note (Signed)
 Severe bilateral lower extremity lymphedema but seems to have been exacerbated by cardiogenic volume overload Attempting to reduce degree of swelling with intravenous diuretics and compression wraps.   Patient will need close outpatient follow-up with the lymphedema clinic after discharge.

## 2023-06-24 NOTE — Assessment & Plan Note (Signed)
 Counseling on increasing physical activity and caloric restriction

## 2023-06-24 NOTE — Assessment & Plan Note (Signed)
 Secondary to multifactorial edema of the bilateral lower extremities leading to skin breakdown and ulcer formation. Wound care consultation was obtained Daily dressing changes per their recommendations.

## 2023-06-24 NOTE — Assessment & Plan Note (Signed)
 Keflex day 2 of 7,  added on 5/2 to doxycycline  monotherapy due to continued redness of the extremity and the fact that doxycycline  does not cover strep Doxycycline  day 5 of 7 complete  Etiology seems to be secondary to skin breakdown in the setting of poorly managed lymphedema exacerbated by acute cardiogenic volume overload

## 2023-06-24 NOTE — Assessment & Plan Note (Signed)
 Continue metoprolol.

## 2023-06-24 NOTE — Assessment & Plan Note (Signed)
?   CPAP nightly with sleep ?

## 2023-06-24 NOTE — Assessment & Plan Note (Signed)
 Excellent urine output with Lasix  40 mg IV twice daily. Weight has decreased from 169.9 kg on admission to 160.8kg Daily weights Strict input and output monitoring Monitoring renal function and electrolytes with serial chemistries

## 2023-06-24 NOTE — Assessment & Plan Note (Signed)
 Patient would benefit from skilled physical therapy services in a skilled nursing facility once approaching euvolemia

## 2023-06-24 NOTE — Assessment & Plan Note (Signed)
 On metformin  twice daily in the outpatient setting Last hemoglobin A1c 5.9% 4/28

## 2023-06-24 NOTE — Progress Notes (Addendum)
 PROGRESS NOTE   Allison Short  MVH:[id number] DOB: [birth year] DOA: 06/18/2023 PCP: [place] At[place], NP   Date of Service: the patient was seen and examined on 06/24/2023  Brief Narrative:  73 y.o. female past medical history significant for essential hypertension, morbid obesity, obstructive sleep apnea/obesity hypoventilation syndrome on CPAP, chronic hypoxic and hypercapnic respiratory failure, non-Hodgkin's lymphoma in remission chronic lymphedema B12 deficiency who presents to [facility] emergency department with bilateral lower extremity edema much worse than her baseline lymphedema.   Upon evaluation in the emergency department chest x-ray revealed bilateral pleural effusions.  Clinically the patient was additionally felt to be suffering from cellulitis of the bilateral lower extremities due to open wounds of the legs due to worsening edema.  Patient was therefore initiated on intravenous Lasix  and vancomycin .  Hospitalist group was then called to assess the patient for admission to hospital.   Patient was managed with intravenous Lasix  resulting in excellent diuresis throughout the hospitalization.  Associated electrolyte abnormalities were replaced.  Strict input and output monitoring was performed as well as daily weights.  Concurrently, patient's cellulitis was treated with intravenous vancomycin  which was later transitioned to oral doxycycline .  Wound care nurse team evaluated the patient's associated bilateral lower extremity wounds and daily dressing changes were recommended and performed daily.  Patient's shortness of breath swelling and bilateral lower extremity pain all gradually improved throughout the hospitalization.  Patient was evaluated by physical therapy during this hospitalization and they recommended the patient would benefit from skilled services in a skilled nursing facility.    Assessment & Plan Acute on chronic heart failure with preserved ejection fraction  (HFpEF) (HCC) Excellent urine output with Lasix  40 mg IV twice daily. Weight has decreased from 169.9 kg on admission to 160.8kg Daily weights Strict input and output monitoring Monitoring renal function and electrolytes with serial chemistries Cellulitis of lower extremity with chronic  lymphedema Keflex day 2 of 7,  added on 5/2 to doxycycline  monotherapy due to continued redness of the extremity and the fact that doxycycline  does not cover strep Doxycycline  day 5 of 7 complete  Etiology seems to be secondary to skin breakdown in the setting of poorly managed lymphedema exacerbated by acute cardiogenic volume overload Lymphedema of both lower extremities Severe bilateral lower extremity lymphedema but seems to have been exacerbated by cardiogenic volume overload Attempting to reduce degree of swelling with intravenous diuretics and compression wraps.   Patient will need close outpatient follow-up with the lymphedema clinic after discharge. Ulcers of both lower legs (HCC) Secondary to multifactorial edema of the bilateral lower extremities leading to skin breakdown and ulcer formation. Wound care consultation was obtained Daily dressing changes per their recommendations. Essential hypertension Continue metoprolol  OSA (obstructive sleep apnea) CPAP nightly with sleep Prediabetes On metformin  twice daily in the outpatient setting Last hemoglobin A1c 5.9% 4/28 Class 3 severe obesity due to excess calories with serious comorbidity and body mass index (BMI) of 50.0 to 59.9 in adult Counseling on increasing physical activity and caloric restriction Generalized weakness Patient would benefit from skilled physical therapy services in a skilled nursing facility once approaching euvolemia      Subjective:  Continued improvement in bilateral lower extremity edema and right lower extremity pain.  Patient denies shortness of breath.  Patient reports that her associated cough seems to be  resolving.  Physical Exam:     06/24/2023    8:23 PM 06/24/2023   11:57 AM 06/24/2023    8:51 AM  Vitals with BMI  Systolic 136 116 098  Diastolic 72 72 59  Pulse 72 76 68     Constitutional: Awake alert and oriented x3, no associated distress.  Patient is obese. Skin: Dressings of the bilateral lower extremities are clean dry and intact.  Patient exhibits extremely dry hyperkeratinized skin of the anterior surfaces of the bilateral lower extremities.  Distal right lower extremity markedly hyperemic. Eyes: Pupils are equally reactive to light.  No evidence of scleral icterus or conjunctival pallor.  ENMT: Moist mucous membranes noted.  Posterior pharynx clear of any exudate or lesions.   Respiratory: Diminished breath sounds at the bases with bibasilar rales.  Normal respiratory effort. No accessory muscle use.  Cardiovascular: Improving jugular venous pulse.  Regular rate and rhythm, no murmurs / rubs / gallops.  Extensive bilateral lower extremity pitting edema that seems to be improving somewhat.  2+ pedal pulses. No carotid bruits.  Abdomen: Abdomen is protuberant but soft and nontender.  No evidence of intra-abdominal masses.  Positive bowel sounds noted in all quadrants.   Musculoskeletal: Diffuse tenderness of the distal right lower extremity, albeit improved.  Good ROM, no contractures. Normal muscle tone.    Data Reviewed:  I have personally reviewed and interpreted labs, imaging.  Significant findings are   CBC: Recent Labs  Lab 06/18/23 0013 06/23/23 0348 06/24/23 0443  WBC 9.8 6.5 6.0  NEUTROABS 6.9 4.6 3.5  HGB 11.2* 14.5 10.9*  HCT 36.9 48.5* 37.1  MCV 101.9* 104.5* 105.1*  PLT 278 211 252   Basic Metabolic Panel: Recent Labs  Lab 06/20/23 0558 06/21/23 0405 06/22/23 0414 06/23/23 0624 06/24/23 0443  NA 140 137 138 138 139  K 3.5 3.8 4.1 4.3 4.3  CL 99 99 99 97* 97*  CO2 33* 29 29 30  35*  GLUCOSE 146* 139* 145* 138* 144*  BUN 23 23 23  25* 28*   CREATININE 1.07* 0.93 0.68 1.03* 1.04*  CALCIUM 8.5* 8.2* 8.3* 8.8* 8.8*  MG  --   --  1.9 1.8 1.8   GFR: Estimated Creatinine Clearance: 76.9 mL/min (A) (by C-G formula based on SCr of 1.04 mg/dL (H)). Liver Function Tests: Recent Labs  Lab 06/19/23 0449 06/22/23 0414 06/23/23 0624 06/24/23 0443  AST 14* 21 19 19   ALT 12 12 18 16   ALKPHOS 117 116 135* 126  BILITOT 1.0 0.9 0.8 0.7  PROT 7.0 6.4* 7.0 6.7  ALBUMIN  3.4* 2.9* 3.2* 3.1*     Code Status:  Full code.  Code status decision has been confirmed with: patient    Severity of Illness:  The appropriate patient status for this patient is INPATIENT. Inpatient status is judged to be reasonable and necessary in order to provide the required intensity of service to ensure the patient's safety. The patient's presenting symptoms, physical exam findings, and initial radiographic and laboratory data in the context of their chronic comorbidities is felt to place them at high risk for further clinical deterioration. Furthermore, it is not anticipated that the patient will be medically stable for discharge from the hospital within 2 midnights of admission.   * I certify that at the point of admission it is my clinical judgment that the patient will require inpatient hospital care spanning beyond 2 midnights from the point of admission due to high intensity of service, high risk for further deterioration and high frequency of surveillance required.*  Time spent:  45 minutes  Author:  True Fuss MD  06/24/2023

## 2023-06-24 NOTE — Plan of Care (Signed)
   Problem: Education: Goal: Knowledge of General Education information will improve Description: Including pain rating scale, medication(s)/side effects and non-pharmacologic comfort measures Outcome: Progressing   Problem: Health Behavior/Discharge Planning: Goal: Ability to manage health-related needs will improve Outcome: Progressing   Problem: Clinical Measurements: Goal: Ability to maintain clinical measurements within normal limits will improve Outcome: Progressing Goal: Will remain free from infection Outcome: Progressing Goal: Diagnostic test results will improve Outcome: Progressing Goal: Respiratory complications will improve Outcome: Progressing Goal: Cardiovascular complication will be avoided Outcome: Progressing   Problem: Activity: Goal: Risk for activity intolerance will decrease Outcome: Progressing   Problem: Nutrition: Goal: Adequate nutrition will be maintained Outcome: Progressing   Problem: Coping: Goal: Level of anxiety will decrease Outcome: Progressing   Problem: Elimination: Goal: Will not experience complications related to bowel motility Outcome: Progressing Goal: Will not experience complications related to urinary retention Outcome: Progressing   Problem: Pain Managment: Goal: General experience of comfort will improve and/or be controlled Outcome: Progressing   Problem: Safety: Goal: Ability to remain free from injury will improve Outcome: Progressing   Problem: Skin Integrity: Goal: Risk for impaired skin integrity will decrease Outcome: Progressing   Problem: Education: Goal: Ability to demonstrate management of disease process will improve Outcome: Progressing Goal: Ability to verbalize understanding of medication therapies will improve Outcome: Progressing Goal: Individualized Educational Video(s) Outcome: Progressing   Problem: Activity: Goal: Capacity to carry out activities will improve Outcome: Progressing    Problem: Cardiac: Goal: Ability to achieve and maintain adequate cardiopulmonary perfusion will improve Outcome: Progressing   Problem: Clinical Measurements: Goal: Ability to avoid or minimize complications of infection will improve Outcome: Progressing   Problem: Skin Integrity: Goal: Skin integrity will improve Outcome: Progressing

## 2023-06-25 DIAGNOSIS — I5033 Acute on chronic diastolic (congestive) heart failure: Secondary | ICD-10-CM | POA: Diagnosis not present

## 2023-06-25 DIAGNOSIS — I890 Lymphedema, not elsewhere classified: Secondary | ICD-10-CM | POA: Diagnosis not present

## 2023-06-25 DIAGNOSIS — E66813 Obesity, class 3: Secondary | ICD-10-CM | POA: Diagnosis not present

## 2023-06-25 DIAGNOSIS — L03119 Cellulitis of unspecified part of limb: Secondary | ICD-10-CM | POA: Diagnosis not present

## 2023-06-25 LAB — RENAL FUNCTION PANEL
Albumin: 3.2 g/dL — ABNORMAL LOW (ref 3.5–5.0)
Anion gap: 10 (ref 5–15)
BUN: 30 mg/dL — ABNORMAL HIGH (ref 8–23)
CO2: 36 mmol/L — ABNORMAL HIGH (ref 22–32)
Calcium: 8.9 mg/dL (ref 8.9–10.3)
Chloride: 95 mmol/L — ABNORMAL LOW (ref 98–111)
Creatinine, Ser: 1.05 mg/dL — ABNORMAL HIGH (ref 0.44–1.00)
GFR, Estimated: 56 mL/min — ABNORMAL LOW (ref >60)
Glucose, Bld: 116 mg/dL — ABNORMAL HIGH (ref 70–99)
Phosphorus: 4.2 mg/dL (ref 2.5–4.6)
Potassium: 4.1 mmol/L (ref 3.5–5.1)
Sodium: 141 mmol/L (ref 135–145)

## 2023-06-25 LAB — MAGNESIUM: Magnesium: 1.8 mg/dL (ref 1.7–2.4)

## 2023-06-25 LAB — GLUCOSE, CAPILLARY
Glucose-Capillary: 139 mg/dL — ABNORMAL HIGH (ref 70–99)
Glucose-Capillary: 170 mg/dL — ABNORMAL HIGH (ref 70–99)
Glucose-Capillary: 89 mg/dL (ref 70–99)
Glucose-Capillary: 132 mg/dL — ABNORMAL HIGH (ref 70–99)

## 2023-06-25 MED ORDER — TORSEMIDE 20 MG PO TABS
20.0000 mg | ORAL_TABLET | Freq: Every day | ORAL | Status: DC
  Filled 2023-06-25 (×2): qty 1

## 2023-06-25 MED ORDER — MAGNESIUM SULFATE 2 GM/50ML IV SOLN
2.0000 g | Freq: Once | INTRAVENOUS | Status: AC
  Administered 2023-06-25: 2 g via INTRAVENOUS
  Filled 2023-06-25: qty 50

## 2023-06-25 MED ORDER — FUROSEMIDE 10 MG/ML IJ SOLN
40.0000 mg | Freq: Two times a day (BID) | INTRAMUSCULAR | Status: AC
  Administered 2023-06-25: 40 mg via INTRAVENOUS
  Filled 2023-06-25: qty 4

## 2023-06-25 MED ORDER — ENOXAPARIN SODIUM 80 MG/0.8ML IJ SOSY
80.0000 mg | PREFILLED_SYRINGE | INTRAMUSCULAR | Status: DC
Start: 2023-06-25 — End: 2023-06-28
  Administered 2023-06-25 – 2023-06-27 (×3): 80 mg via SUBCUTANEOUS
  Filled 2023-06-25 (×3): qty 0.8

## 2023-06-25 NOTE — Progress Notes (Signed)
   06/25/23 2258  BiPAP/CPAP/SIPAP  Reason BIPAP/CPAP not in use Non-compliant

## 2023-06-25 NOTE — Assessment & Plan Note (Signed)
 Severe bilateral lower extremity lymphedema but seems to have been exacerbated by cardiogenic volume overload Attempting to reduce degree of swelling with intravenous diuretics and compression wraps.   Patient will need close outpatient follow-up with the lymphedema clinic after discharge.

## 2023-06-25 NOTE — Assessment & Plan Note (Signed)
 Patient would benefit from skilled physical therapy services in a skilled nursing facility once approaching euvolemia

## 2023-06-25 NOTE — Assessment & Plan Note (Signed)
 Counseling on increasing physical activity and caloric restriction

## 2023-06-25 NOTE — Plan of Care (Signed)
  Problem: Education: Goal: Knowledge of General Education information will improve Description: Including pain rating scale, medication(s)/side effects and non-pharmacologic comfort measures Outcome: Progressing   Problem: Health Behavior/Discharge Planning: Goal: Ability to manage health-related needs will improve Outcome: Progressing   Problem: Clinical Measurements: Goal: Ability to maintain clinical measurements within normal limits will improve Outcome: Progressing Goal: Will remain free from infection Outcome: Progressing Goal: Diagnostic test results will improve Outcome: Progressing Goal: Respiratory complications will improve Outcome: Progressing Goal: Cardiovascular complication will be avoided Outcome: Progressing   Problem: Activity: Goal: Risk for activity intolerance will decrease Outcome: Progressing   Problem: Nutrition: Goal: Adequate nutrition will be maintained Outcome: Progressing   Problem: Coping: Goal: Level of anxiety will decrease Outcome: Progressing   Problem: Elimination: Goal: Will not experience complications related to bowel motility Outcome: Progressing Goal: Will not experience complications related to urinary retention Outcome: Progressing   Problem: Pain Managment: Goal: General experience of comfort will improve and/or be controlled Outcome: Progressing   Problem: Safety: Goal: Ability to remain free from injury will improve Outcome: Progressing   Problem: Skin Integrity: Goal: Risk for impaired skin integrity will decrease Outcome: Progressing   Problem: Education: Goal: Ability to demonstrate management of disease process will improve Outcome: Progressing Goal: Ability to verbalize understanding of medication therapies will improve Outcome: Progressing Goal: Individualized Educational Video(s) Outcome: Progressing   Problem: Activity: Goal: Capacity to carry out activities will improve Outcome: Progressing    Problem: Cardiac: Goal: Ability to achieve and maintain adequate cardiopulmonary perfusion will improve Outcome: Progressing   Problem: Clinical Measurements: Goal: Ability to avoid or minimize complications of infection will improve Outcome: Progressing   Problem: Skin Integrity: Goal: Skin integrity will improve Outcome: Progressing   Problem: Education: Goal: Ability to describe self-care measures that may prevent or decrease complications (Diabetes Survival Skills Education) will improve Outcome: Progressing Goal: Individualized Educational Video(s) Outcome: Progressing   Problem: Coping: Goal: Ability to adjust to condition or change in health will improve Outcome: Progressing   Problem: Fluid Volume: Goal: Ability to maintain a balanced intake and output will improve Outcome: Progressing   Problem: Health Behavior/Discharge Planning: Goal: Ability to identify and utilize available resources and services will improve Outcome: Progressing Goal: Ability to manage health-related needs will improve Outcome: Progressing   Problem: Metabolic: Goal: Ability to maintain appropriate glucose levels will improve Outcome: Progressing   Problem: Nutritional: Goal: Maintenance of adequate nutrition will improve Outcome: Progressing Goal: Progress toward achieving an optimal weight will improve Outcome: Progressing   Problem: Skin Integrity: Goal: Risk for impaired skin integrity will decrease Outcome: Progressing   Problem: Tissue Perfusion: Goal: Adequacy of tissue perfusion will improve Outcome: Progressing

## 2023-06-25 NOTE — Assessment & Plan Note (Signed)
 Continue metoprolol.

## 2023-06-25 NOTE — Plan of Care (Signed)
 Problem: Education: Goal: Knowledge of General Education information will improve Description: Including pain rating scale, medication(s)/side effects and non-pharmacologic comfort measures 06/25/2023 0703 by Abbott Hockey, RN Outcome: Progressing 06/25/2023 0703 by Abbott Hockey, RN Outcome: Progressing 06/25/2023 0702 by Abbott Hockey, RN Outcome: Progressing   Problem: Health Behavior/Discharge Planning: Goal: Ability to manage health-related needs will improve 06/25/2023 0703 by Abbott Hockey, RN Outcome: Progressing 06/25/2023 0703 by Abbott Hockey, RN Outcome: Progressing 06/25/2023 0702 by Abbott Hockey, RN Outcome: Progressing   Problem: Clinical Measurements: Goal: Ability to maintain clinical measurements within normal limits will improve 06/25/2023 0703 by Abbott Hockey, RN Outcome: Progressing 06/25/2023 0703 by Abbott Hockey, RN Outcome: Progressing 06/25/2023 0702 by Abbott Hockey, RN Outcome: Progressing Goal: Will remain free from infection 06/25/2023 0703 by Abbott Hockey, RN Outcome: Progressing 06/25/2023 0703 by Abbott Hockey, RN Outcome: Progressing 06/25/2023 0702 by Abbott Hockey, RN Outcome: Progressing Goal: Diagnostic test results will improve 06/25/2023 0703 by Abbott Hockey, RN Outcome: Progressing 06/25/2023 0703 by Abbott Hockey, RN Outcome: Progressing 06/25/2023 0702 by Abbott Hockey, RN Outcome: Progressing Goal: Respiratory complications will improve 06/25/2023 0703 by Abbott Hockey, RN Outcome: Progressing 06/25/2023 0703 by Abbott Hockey, RN Outcome: Progressing 06/25/2023 0702 by Abbott Hockey, RN Outcome: Progressing Goal: Cardiovascular complication will be avoided 06/25/2023 0703 by Abbott Hockey, RN Outcome: Progressing 06/25/2023 0703 by Abbott Hockey, RN Outcome: Progressing 06/25/2023 0702 by Abbott Hockey, RN Outcome: Progressing   Problem:  Activity: Goal: Risk for activity intolerance will decrease 06/25/2023 0703 by Abbott Hockey, RN Outcome: Progressing 06/25/2023 0703 by Abbott Hockey, RN Outcome: Progressing 06/25/2023 0702 by Abbott Hockey, RN Outcome: Progressing   Problem: Nutrition: Goal: Adequate nutrition will be maintained 06/25/2023 0703 by Abbott Hockey, RN Outcome: Progressing 06/25/2023 0703 by Abbott Hockey, RN Outcome: Progressing 06/25/2023 0702 by Abbott Hockey, RN Outcome: Progressing   Problem: Coping: Goal: Level of anxiety will decrease 06/25/2023 0703 by Abbott Hockey, RN Outcome: Progressing 06/25/2023 0703 by Abbott Hockey, RN Outcome: Progressing 06/25/2023 0702 by Abbott Hockey, RN Outcome: Progressing   Problem: Elimination: Goal: Will not experience complications related to bowel motility 06/25/2023 0703 by Abbott Hockey, RN Outcome: Progressing 06/25/2023 0703 by Abbott Hockey, RN Outcome: Progressing 06/25/2023 0702 by Abbott Hockey, RN Outcome: Progressing Goal: Will not experience complications related to urinary retention 06/25/2023 0703 by Abbott Hockey, RN Outcome: Progressing 06/25/2023 0703 by Abbott Hockey, RN Outcome: Progressing 06/25/2023 0702 by Abbott Hockey, RN Outcome: Progressing   Problem: Pain Managment: Goal: General experience of comfort will improve and/or be controlled 06/25/2023 0703 by Abbott Hockey, RN Outcome: Progressing 06/25/2023 0703 by Abbott Hockey, RN Outcome: Progressing 06/25/2023 0702 by Abbott Hockey, RN Outcome: Progressing   Problem: Safety: Goal: Ability to remain free from injury will improve 06/25/2023 0703 by Abbott Hockey, RN Outcome: Progressing 06/25/2023 0703 by Abbott Hockey, RN Outcome: Progressing 06/25/2023 0702 by Abbott Hockey, RN Outcome: Progressing   Problem: Skin Integrity: Goal: Risk for impaired skin integrity will decrease 06/25/2023 0703  by Abbott Hockey, RN Outcome: Progressing 06/25/2023 0703 by Abbott Hockey, RN Outcome: Progressing 06/25/2023 0702 by Abbott Hockey, RN Outcome: Progressing   Problem: Education: Goal: Ability to demonstrate management of disease process will improve 06/25/2023 0703 by Abbott Hockey, RN Outcome: Progressing 06/25/2023 0703  by Abbott Hockey, RN Outcome: Progressing 06/25/2023 0702 by Abbott Hockey, RN Outcome: Progressing Goal: Ability to verbalize understanding of medication therapies will improve 06/25/2023 0703 by Abbott Hockey, RN Outcome: Progressing 06/25/2023 0703 by Abbott Hockey, RN Outcome: Progressing 06/25/2023 0702 by Abbott Hockey, RN Outcome: Progressing Goal: Individualized Educational Video(s) 06/25/2023 0703 by Abbott Hockey, RN Outcome: Progressing 06/25/2023 0703 by Abbott Hockey, RN Outcome: Progressing 06/25/2023 0702 by Abbott Hockey, RN Outcome: Progressing   Problem: Activity: Goal: Capacity to carry out activities will improve 06/25/2023 0703 by Abbott Hockey, RN Outcome: Progressing 06/25/2023 0703 by Abbott Hockey, RN Outcome: Progressing 06/25/2023 0702 by Abbott Hockey, RN Outcome: Progressing   Problem: Cardiac: Goal: Ability to achieve and maintain adequate cardiopulmonary perfusion will improve 06/25/2023 0703 by Abbott Hockey, RN Outcome: Progressing 06/25/2023 0703 by Abbott Hockey, RN Outcome: Progressing 06/25/2023 0702 by Abbott Hockey, RN Outcome: Progressing   Problem: Clinical Measurements: Goal: Ability to avoid or minimize complications of infection will improve 06/25/2023 0703 by Abbott Hockey, RN Outcome: Progressing 06/25/2023 0703 by Abbott Hockey, RN Outcome: Progressing 06/25/2023 0702 by Abbott Hockey, RN Outcome: Progressing   Problem: Skin Integrity: Goal: Skin integrity will improve 06/25/2023 0703 by Abbott Hockey, RN Outcome:  Progressing 06/25/2023 0703 by Abbott Hockey, RN Outcome: Progressing 06/25/2023 0702 by Abbott Hockey, RN Outcome: Progressing   Problem: Education: Goal: Ability to describe self-care measures that may prevent or decrease complications (Diabetes Survival Skills Education) will improve 06/25/2023 0703 by Abbott Hockey, RN Outcome: Progressing 06/25/2023 0703 by Abbott Hockey, RN Outcome: Progressing 06/25/2023 0702 by Abbott Hockey, RN Outcome: Progressing Goal: Individualized Educational Video(s) 06/25/2023 0703 by Abbott Hockey, RN Outcome: Progressing 06/25/2023 0703 by Abbott Hockey, RN Outcome: Progressing 06/25/2023 0702 by Abbott Hockey, RN Outcome: Progressing   Problem: Coping: Goal: Ability to adjust to condition or change in health will improve 06/25/2023 0703 by Abbott Hockey, RN Outcome: Progressing 06/25/2023 0703 by Abbott Hockey, RN Outcome: Progressing 06/25/2023 0702 by Abbott Hockey, RN Outcome: Progressing   Problem: Fluid Volume: Goal: Ability to maintain a balanced intake and output will improve 06/25/2023 0703 by Abbott Hockey, RN Outcome: Progressing 06/25/2023 0703 by Abbott Hockey, RN Outcome: Progressing 06/25/2023 0702 by Abbott Hockey, RN Outcome: Progressing   Problem: Health Behavior/Discharge Planning: Goal: Ability to identify and utilize available resources and services will improve 06/25/2023 0703 by Abbott Hockey, RN Outcome: Progressing 06/25/2023 0703 by Abbott Hockey, RN Outcome: Progressing 06/25/2023 0702 by Abbott Hockey, RN Outcome: Progressing Goal: Ability to manage health-related needs will improve 06/25/2023 0703 by Abbott Hockey, RN Outcome: Progressing 06/25/2023 0703 by Abbott Hockey, RN Outcome: Progressing 06/25/2023 0702 by Abbott Hockey, RN Outcome: Progressing   Problem: Metabolic: Goal: Ability to maintain appropriate glucose levels will  improve 06/25/2023 0703 by Abbott Hockey, RN Outcome: Progressing 06/25/2023 0703 by Abbott Hockey, RN Outcome: Progressing 06/25/2023 0702 by Abbott Hockey, RN Outcome: Progressing   Problem: Nutritional: Goal: Maintenance of adequate nutrition will improve 06/25/2023 0703 by Abbott Hockey, RN Outcome: Progressing 06/25/2023 0703 by Abbott Hockey, RN Outcome: Progressing 06/25/2023 0702 by Abbott Hockey, RN Outcome: Progressing Goal: Progress toward achieving an optimal weight will improve 06/25/2023 0703 by Abbott Hockey, RN Outcome: Progressing 06/25/2023 0703 by Abbott Hockey, RN Outcome: Progressing 06/25/2023 [phone]  by Abbott Hockey, RN Outcome: Progressing   Problem: Skin Integrity: Goal: Risk for impaired skin integrity will decrease 06/25/2023 0703 by Abbott Hockey, RN Outcome: Progressing 06/25/2023 0703 by Abbott Hockey, RN Outcome: Progressing 06/25/2023 0702 by Abbott Hockey, RN Outcome: Progressing   Problem: Tissue Perfusion: Goal: Adequacy of tissue perfusion will improve 06/25/2023 0703 by Abbott Hockey, RN Outcome: Progressing 06/25/2023 0703 by Abbott Hockey, RN Outcome: Progressing 06/25/2023 0702 by Abbott Hockey, RN Outcome: Progressing

## 2023-06-25 NOTE — Plan of Care (Signed)
 Problem: Education: Goal: Knowledge of General Education information will improve Description: Including pain rating scale, medication(s)/side effects and non-pharmacologic comfort measures 06/25/2023 0703 by Abbott Hockey, RN Outcome: Progressing 06/25/2023 0702 by Abbott Hockey, RN Outcome: Progressing   Problem: Health Behavior/Discharge Planning: Goal: Ability to manage health-related needs will improve 06/25/2023 0703 by Abbott Hockey, RN Outcome: Progressing 06/25/2023 0702 by Abbott Hockey, RN Outcome: Progressing   Problem: Clinical Measurements: Goal: Ability to maintain clinical measurements within normal limits will improve 06/25/2023 0703 by Abbott Hockey, RN Outcome: Progressing 06/25/2023 0702 by Abbott Hockey, RN Outcome: Progressing Goal: Will remain free from infection 06/25/2023 0703 by Abbott Hockey, RN Outcome: Progressing 06/25/2023 0702 by Abbott Hockey, RN Outcome: Progressing Goal: Diagnostic test results will improve 06/25/2023 0703 by Abbott Hockey, RN Outcome: Progressing 06/25/2023 0702 by Abbott Hockey, RN Outcome: Progressing Goal: Respiratory complications will improve 06/25/2023 0703 by Abbott Hockey, RN Outcome: Progressing 06/25/2023 0702 by Abbott Hockey, RN Outcome: Progressing Goal: Cardiovascular complication will be avoided 06/25/2023 0703 by Abbott Hockey, RN Outcome: Progressing 06/25/2023 0702 by Abbott Hockey, RN Outcome: Progressing   Problem: Activity: Goal: Risk for activity intolerance will decrease 06/25/2023 0703 by Abbott Hockey, RN Outcome: Progressing 06/25/2023 0702 by Abbott Hockey, RN Outcome: Progressing   Problem: Nutrition: Goal: Adequate nutrition will be maintained 06/25/2023 0703 by Abbott Hockey, RN Outcome: Progressing 06/25/2023 0702 by Abbott Hockey, RN Outcome: Progressing   Problem: Coping: Goal: Level of anxiety will decrease 06/25/2023  0703 by Abbott Hockey, RN Outcome: Progressing 06/25/2023 0702 by Abbott Hockey, RN Outcome: Progressing   Problem: Elimination: Goal: Will not experience complications related to bowel motility 06/25/2023 0703 by Abbott Hockey, RN Outcome: Progressing 06/25/2023 0702 by Abbott Hockey, RN Outcome: Progressing Goal: Will not experience complications related to urinary retention 06/25/2023 0703 by Abbott Hockey, RN Outcome: Progressing 06/25/2023 0702 by Abbott Hockey, RN Outcome: Progressing   Problem: Pain Managment: Goal: General experience of comfort will improve and/or be controlled 06/25/2023 0703 by Abbott Hockey, RN Outcome: Progressing 06/25/2023 0702 by Abbott Hockey, RN Outcome: Progressing   Problem: Safety: Goal: Ability to remain free from injury will improve 06/25/2023 0703 by Abbott Hockey, RN Outcome: Progressing 06/25/2023 0702 by Abbott Hockey, RN Outcome: Progressing   Problem: Skin Integrity: Goal: Risk for impaired skin integrity will decrease 06/25/2023 0703 by Abbott Hockey, RN Outcome: Progressing 06/25/2023 0702 by Abbott Hockey, RN Outcome: Progressing   Problem: Education: Goal: Ability to demonstrate management of disease process will improve 06/25/2023 0703 by Abbott Hockey, RN Outcome: Progressing 06/25/2023 0702 by Abbott Hockey, RN Outcome: Progressing Goal: Ability to verbalize understanding of medication therapies will improve 06/25/2023 0703 by Abbott Hockey, RN Outcome: Progressing 06/25/2023 0702 by Abbott Hockey, RN Outcome: Progressing Goal: Individualized Educational Video(s) 06/25/2023 0703 by Abbott Hockey, RN Outcome: Progressing 06/25/2023 0702 by Abbott Hockey, RN Outcome: Progressing   Problem: Activity: Goal: Capacity to carry out activities will improve 06/25/2023 0703 by Abbott Hockey, RN Outcome: Progressing 06/25/2023 0702 by Abbott Hockey,  RN Outcome: Progressing   Problem: Cardiac: Goal: Ability to achieve and maintain adequate cardiopulmonary perfusion will improve 06/25/2023 0703 by Abbott Hockey, RN Outcome: Progressing 06/25/2023 0702 by Abbott Hockey, RN Outcome: Progressing   Problem: Clinical Measurements: Goal: Ability to avoid or minimize complications of infection will  improve 06/25/2023 0703 by Abbott Hockey, RN Outcome: Progressing 06/25/2023 0702 by Abbott Hockey, RN Outcome: Progressing   Problem: Skin Integrity: Goal: Skin integrity will improve 06/25/2023 0703 by Abbott Hockey, RN Outcome: Progressing 06/25/2023 0702 by Abbott Hockey, RN Outcome: Progressing   Problem: Education: Goal: Ability to describe self-care measures that may prevent or decrease complications (Diabetes Survival Skills Education) will improve 06/25/2023 0703 by Abbott Hockey, RN Outcome: Progressing 06/25/2023 0702 by Abbott Hockey, RN Outcome: Progressing Goal: Individualized Educational Video(s) 06/25/2023 0703 by Abbott Hockey, RN Outcome: Progressing 06/25/2023 0702 by Abbott Hockey, RN Outcome: Progressing   Problem: Coping: Goal: Ability to adjust to condition or change in health will improve 06/25/2023 0703 by Abbott Hockey, RN Outcome: Progressing 06/25/2023 0702 by Abbott Hockey, RN Outcome: Progressing   Problem: Fluid Volume: Goal: Ability to maintain a balanced intake and output will improve 06/25/2023 0703 by Abbott Hockey, RN Outcome: Progressing 06/25/2023 0702 by Abbott Hockey, RN Outcome: Progressing   Problem: Health Behavior/Discharge Planning: Goal: Ability to identify and utilize available resources and services will improve 06/25/2023 0703 by Abbott Hockey, RN Outcome: Progressing 06/25/2023 0702 by Abbott Hockey, RN Outcome: Progressing Goal: Ability to manage health-related needs will improve 06/25/2023 0703 by Abbott Hockey,  RN Outcome: Progressing 06/25/2023 0702 by Abbott Hockey, RN Outcome: Progressing   Problem: Metabolic: Goal: Ability to maintain appropriate glucose levels will improve 06/25/2023 0703 by Abbott Hockey, RN Outcome: Progressing 06/25/2023 0702 by Abbott Hockey, RN Outcome: Progressing   Problem: Nutritional: Goal: Maintenance of adequate nutrition will improve 06/25/2023 0703 by Abbott Hockey, RN Outcome: Progressing 06/25/2023 0702 by Abbott Hockey, RN Outcome: Progressing Goal: Progress toward achieving an optimal weight will improve 06/25/2023 0703 by Abbott Hockey, RN Outcome: Progressing 06/25/2023 0702 by Abbott Hockey, RN Outcome: Progressing   Problem: Skin Integrity: Goal: Risk for impaired skin integrity will decrease 06/25/2023 0703 by Abbott Hockey, RN Outcome: Progressing 06/25/2023 0702 by Abbott Hockey, RN Outcome: Progressing   Problem: Tissue Perfusion: Goal: Adequacy of tissue perfusion will improve 06/25/2023 0703 by Abbott Hockey, RN Outcome: Progressing 06/25/2023 0702 by Abbott Hockey, RN Outcome: Progressing

## 2023-06-25 NOTE — Assessment & Plan Note (Signed)
?   CPAP nightly with sleep ?

## 2023-06-25 NOTE — Assessment & Plan Note (Signed)
 On metformin  twice daily in the outpatient setting Last hemoglobin A1c 5.9% 4/28

## 2023-06-25 NOTE — Assessment & Plan Note (Addendum)
 Keflex day 3 of 7,  added on 5/2 to doxycycline  monotherapy due to continued redness of the extremity and the fact that doxycycline  does not cover strep Doxycycline  day 6 of 7 complete  Etiology seems to be secondary to skin breakdown in the setting of poorly managed lymphedema exacerbated by acute cardiogenic volume overload

## 2023-06-25 NOTE — Progress Notes (Addendum)
 PROGRESS NOTE   Allison Short  ZOX:[id number] DOB: [birth year] DOA: 06/18/2023 PCP: [place], NP   Date of Service: the patient was seen and examined on 06/25/2023  Brief Narrative:  73 y.o. female past medical history significant for essential hypertension, morbid obesity, obstructive sleep apnea/obesity hypoventilation syndrome on CPAP, chronic hypoxic and hypercapnic respiratory failure, non-Hodgkin's lymphoma in remission chronic lymphedema B12 deficiency who presents to [facility] emergency department with bilateral lower extremity edema much worse than her baseline lymphedema.   Upon evaluation in the emergency department chest x-ray revealed bilateral pleural effusions.  Clinically the patient was additionally felt to be suffering from cellulitis of the bilateral lower extremities due to open wounds of the legs due to worsening edema.  Patient was therefore initiated on intravenous Lasix  and vancomycin .  Hospitalist group was then called to assess the patient for admission to hospital.   Patient was managed with intravenous Lasix  resulting in excellent diuresis throughout the hospitalization.  Associated electrolyte abnormalities were replaced.  Strict input and output monitoring was performed as well as daily weights.  Concurrently, patient's cellulitis was treated with intravenous vancomycin  which was later transitioned to oral doxycycline .  Wound care nurse team evaluated the patient's associated bilateral lower extremity wounds and daily dressing changes were recommended and performed daily.  Patient's shortness of breath swelling and bilateral lower extremity pain all gradually improved throughout the hospitalization.  Patient was evaluated by physical therapy during this hospitalization and they recommended the patient would benefit from skilled services in a skilled nursing facility.    Assessment & Plan Acute on chronic heart failure with preserved ejection fraction  (HFpEF) (HCC) Excellent urine output with Lasix  40 mg IV twice daily. Transitioning to torsemide beginning 5/5. Weight has decreased from 169.9 kg on admission to 159.3kg today Daily weights Strict input and output monitoring Monitoring renal function and electrolytes with serial chemistries Cellulitis of lower extremity with chronic  lymphedema Keflex day 3 of 7,  added on 5/2 to doxycycline  monotherapy due to continued redness of the extremity and the fact that doxycycline  does not cover strep Doxycycline  day 6 of 7 complete  Etiology seems to be secondary to skin breakdown in the setting of poorly managed lymphedema exacerbated by acute cardiogenic volume overload Lymphedema of both lower extremities Severe bilateral lower extremity lymphedema but seems to have been exacerbated by cardiogenic volume overload Attempting to reduce degree of swelling with intravenous diuretics and compression wraps.   Patient will need close outpatient follow-up with the lymphedema clinic after discharge. Ulcers of both lower legs (HCC) Secondary to multifactorial edema of the bilateral lower extremities leading to skin breakdown and ulcer formation. Wound care consultation was obtained Daily dressing changes per their recommendations. Essential hypertension Continue metoprolol  OSA (obstructive sleep apnea) CPAP nightly with sleep Prediabetes On metformin  twice daily in the outpatient setting Last hemoglobin A1c 5.9% 4/28 Class 3 severe obesity due to excess calories with serious comorbidity and body mass index (BMI) of 50.0 to 59.9 in adult Counseling on increasing physical activity and caloric restriction Generalized weakness Patient would benefit from skilled physical therapy services in a skilled nursing facility once approaching euvolemia     Subjective:  Continued improvement in bilateral lower extremity edema and right lower extremity pain.  Patient does still complain of some mild cough but  denies shortness of breath.  Physical Exam:  Vitals:   06/25/23 0830 06/25/23 2018 06/25/23 2100 06/25/23 2300  BP:   (!) 141/55   Pulse:  73   Temp:   99 F (37.2 C)   Resp:   18   Height:      Weight:      SpO2: 99% 97% 100% 98%  TempSrc:   Oral   BMI (Calculated):         Constitutional: Awake alert and oriented x3, no associated distress.  Patient is obese. Skin: Dressings of the bilateral lower extremities are clean dry and intact.  Patient exhibits extremely dry hyperkeratinized skin of the anterior surfaces of the bilateral lower extremities.  Distal right lower extremity hyperemia and induration seems to be improving. Eyes: Pupils are equally reactive to light.  No evidence of scleral icterus or conjunctival pallor.  ENMT: Moist mucous membranes noted.  Posterior pharynx clear of any exudate or lesions.   Respiratory: Diminished breath sounds at the bases with bibasilar rales but seems to be improving.  Normal respiratory effort. No accessory muscle use.  Cardiovascular: Improving jugular venous pulse.  Regular rate and rhythm, no murmurs / rubs / gallops.  Extensive bilateral lower extremity pitting edema improving.  2+ pedal pulses. No carotid bruits.  Abdomen: Abdomen is protuberant but soft and nontender.  No evidence of intra-abdominal masses.  Positive bowel sounds noted in all quadrants.   Musculoskeletal: Diffuse tenderness of the distal right lower extremity nearly resolved.  Good ROM, no contractures. Normal muscle tone.    Data Reviewed:  I have personally reviewed and interpreted labs, imaging.  Significant findings are   CBC: Recent Labs  Lab 06/23/23 0348 06/24/23 0443  WBC 6.5 6.0  NEUTROABS 4.6 3.5  HGB 14.5 10.9*  HCT 48.5* 37.1  MCV 104.5* 105.1*  PLT 211 252   Basic Metabolic Panel: Recent Labs  Lab 06/21/23 0405 06/22/23 0414 06/23/23 0624 06/24/23 0443 06/25/23 0408  NA 137 138 138 139 141  K 3.8 4.1 4.3 4.3 4.1  CL 99 99 97* 97*  95*  CO2 29 29 30  35* 36*  GLUCOSE 139* 145* 138* 144* 116*  BUN 23 23 25* 28* 30*  CREATININE 0.93 0.68 1.03* 1.04* 1.05*  CALCIUM 8.2* 8.3* 8.8* 8.8* 8.9  MG  --  1.9 1.8 1.8 1.8  PHOS  --   --   --   --  4.2   GFR: Estimated Creatinine Clearance: 75.8 mL/min (A) (by C-G formula based on SCr of 1.05 mg/dL (H)). Liver Function Tests: Recent Labs  Lab 06/19/23 0449 06/22/23 0414 06/23/23 0624 06/24/23 0443 06/25/23 0408  AST 14* 21 19 19   --   ALT 12 12 18 16   --   ALKPHOS 117 116 135* 126  --   BILITOT 1.0 0.9 0.8 0.7  --   PROT 7.0 6.4* 7.0 6.7  --   ALBUMIN  3.4* 2.9* 3.2* 3.1* 3.2*     Code Status:  Full code.  Code status decision has been confirmed with: patient    Severity of Illness:  The appropriate patient status for this patient is INPATIENT. Inpatient status is judged to be reasonable and necessary in order to provide the required intensity of service to ensure the patient's safety. The patient's presenting symptoms, physical exam findings, and initial radiographic and laboratory data in the context of their chronic comorbidities is felt to place them at high risk for further clinical deterioration. Furthermore, it is not anticipated that the patient will be medically stable for discharge from the hospital within 2 midnights of admission.   * I certify that at the point of admission  it is my clinical judgment that the patient will require inpatient hospital care spanning beyond 2 midnights from the point of admission due to high intensity of service, high risk for further deterioration and high frequency of surveillance required.*  Time spent:  46 minutes  Author:  True Fuss MD  06/25/2023

## 2023-06-25 NOTE — Assessment & Plan Note (Addendum)
 Excellent urine output with Lasix  40 mg IV twice daily. Transitioning to torsemide beginning 5/5. Weight has decreased from 169.9 kg on admission to 159.3kg today Daily weights Strict input and output monitoring Monitoring renal function and electrolytes with serial chemistries

## 2023-06-25 NOTE — Assessment & Plan Note (Signed)
 Secondary to multifactorial edema of the bilateral lower extremities leading to skin breakdown and ulcer formation. Wound care consultation was obtained Daily dressing changes per their recommendations.

## 2023-06-26 DIAGNOSIS — I5033 Acute on chronic diastolic (congestive) heart failure: Secondary | ICD-10-CM | POA: Diagnosis not present

## 2023-06-26 LAB — GLUCOSE, CAPILLARY
Glucose-Capillary: 105 mg/dL — ABNORMAL HIGH (ref 70–99)
Glucose-Capillary: 123 mg/dL — ABNORMAL HIGH (ref 70–99)
Glucose-Capillary: 100 mg/dL — ABNORMAL HIGH (ref 70–99)
Glucose-Capillary: 124 mg/dL — ABNORMAL HIGH (ref 70–99)

## 2023-06-26 MED ORDER — TORSEMIDE 20 MG PO TABS
40.0000 mg | ORAL_TABLET | Freq: Every day | ORAL | Status: DC
  Administered 2023-06-26 – 2023-06-27 (×2): 40 mg via ORAL
  Filled 2023-06-26 (×2): qty 2

## 2023-06-26 MED ORDER — DIPHENHYDRAMINE-ZINC ACETATE 2-0.1 % EX CREA
TOPICAL_CREAM | Freq: Every day | CUTANEOUS | Status: DC | PRN
  Filled 2023-06-26: qty 28

## 2023-06-26 NOTE — Progress Notes (Signed)
 Physical Therapy Treatment Patient Details Name: Allison Short MRN: [id number] DOB: 10/15/1950 Today's Date: 06/26/2023   History of Present Illness 73 yo female admitted with acute on chronic HF, LE celluliltis. Hx of lymphedema, chronic LE edema, CHF, OSA, morbid obesity, NHL, B TKA    PT Comments  Pt received in bed, states she needs to have a BM. Overall, pt mobilized very well utilizing momentum to maneuver B LE's to EOB and power up to standing multiple times at RW. Repeated cues for PLB technique and reduce straining. Pt able to ambulate a short distance in room with bariatric RW to/from [facility] with CGA. Assist for hygiene in standing after experiencing a very large BM. Pt assisted back to bed in upright long sitting with all needs met. Increased tolerance for activity this date. Pt remains motivated to return to functional baseline.   If plan is discharge home, recommend the following: A lot of help with walking and/or transfers;A lot of help with bathing/dressing/bathroom;Assistance with cooking/housework;Assist for transportation;Help with stairs or ramp for entrance   Can travel by private vehicle     No  Equipment Recommendations  None recommended by PT    Recommendations for Other Services       Precautions / Restrictions Precautions Precautions: Fall Restrictions Weight Bearing Restrictions Per Provider Order: No     Mobility  Bed Mobility Overal bed mobility: Needs Assistance Bed Mobility: Supine to Sit, Sit to Supine Rolling: Mod assist, Used rails   Supine to sit: Min assist, HOB elevated, Used rails Sit to supine: Min assist (For LE's)   General bed mobility comments: MinA to sit at EOB with use of momentum and bed features.  Assist for bil LE back to bed    Transfers Overall transfer level: Needs assistance Equipment used: Rolling walker (2 wheels) Transfers: Sit to/from Stand Sit to Stand: Min assist           General transfer comment: MinA to stand from  bed and BSC with cues for technique    Ambulation/Gait Ambulation/Gait assistance: Min assist, Contact guard assist Gait Distance (Feet): 5 Feet Assistive device: Rolling walker (2 wheels) Gait Pattern/deviations: Step-to pattern, Decreased stride length, Trunk flexed, Wide base of support Gait velocity: decreased     General Gait Details: Pt able to ambulate short distance to/from [facility] in room   Stairs             Wheelchair Mobility     Tilt Bed    Modified Rankin (Stroke Patients Only)       Balance Overall balance assessment: Needs assistance Sitting-balance support: No upper extremity supported Sitting balance-Leahy Scale: Good Sitting balance - Comments: Pt sat EOB for several minutes without c/o increased pain. Cues for PLB technique after straining to transfer self. SpO2 97% on RA   Standing balance support: Bilateral upper extremity supported, During functional activity, Reliant on assistive device for balance Standing balance-Leahy Scale: Fair Standing balance comment: Fair static standing at [employer] for hygiene after BM                            Communication Communication Communication: No apparent difficulties  Cognition Arousal: Alert Behavior During Therapy: WFL for tasks assessed/performed   PT - Cognitive impairments: No apparent impairments                       PT - Cognition Comments: Very pleasant and cooperative Following commands: Intact  Cueing Cueing Techniques: Verbal cues  Exercises General Exercises - Lower Extremity Ankle Circles/Pumps: AROM, Both, 10 reps, Supine Long Arc Quad: AROM, Both, 5 reps Other Exercises Other Exercises: Pt educated on role of PT, recs for STR at d/c, pt requesting [place]    General Comments General comments (skin integrity, edema, etc.): Left foot with gauze wrap intact. No dressing on Right      Pertinent Vitals/Pain Pain Assessment Pain Assessment: No/denies pain     Home Living                          Prior Function            PT Goals (current goals can now be found in the care plan section) Acute Rehab PT Goals Patient Stated Goal: less pain. be able to return home Progress towards PT goals: Progressing toward goals    Frequency    Min 2X/week      PT Plan      Co-evaluation              AM-PAC PT "6 Clicks" Mobility   Outcome Measure  Help needed turning from your back to your side while in a flat bed without using bedrails?: A Lot Help needed moving from lying on your back to sitting on the side of a flat bed without using bedrails?: A Lot Help needed moving to and from a bed to a chair (including a wheelchair)?: A Little Help needed standing up from a chair using your arms (e.g., wheelchair or bedside chair)?: A Little Help needed to walk in hospital room?: A Little Help needed climbing 3-5 steps with a railing? : Total 6 Click Score: 14    End of Session Equipment Utilized During Treatment: Gait belt Activity Tolerance: Patient tolerated treatment well Patient left: in bed;with call bell/phone within reach;with bed alarm set Nurse Communication: Mobility status;Other (comment) (X-Large BM) PT Visit Diagnosis: Muscle weakness (generalized) (M62.81);Pain;Difficulty in walking, not elsewhere classified (R26.2);History of falling (Z91.81)     Time: 1350-1444 PT Time Calculation (min) (ACUTE ONLY): 54 min  Charges:    $Gait Training: 8-22 mins $Therapeutic Exercise: 8-22 mins $Therapeutic Activity: 23-37 mins PT General Charges $$ ACUTE PT VISIT: 1 Visit                    Melvyn Stagers, PTA  Diona Franklin 06/26/2023, 3:35 PM

## 2023-06-26 NOTE — Progress Notes (Signed)
   06/26/23 2252  BiPAP/CPAP/SIPAP  $ Non-Invasive Home Ventilator  Subsequent  BiPAP/CPAP/SIPAP Pt Type Adult  Reason BIPAP/CPAP not in use Non-compliant

## 2023-06-26 NOTE — Progress Notes (Signed)
 PROGRESS NOTE    Allison Short  ZOX:[id number] DOB: Sep 30, 1950 DOA: 06/18/2023 PCP: [place] Atta, NP    Brief Narrative:   Allison Short is a 73 y.o. female with past medical history significant for essential hypertension, morbid obesity, obstructive sleep apnea/obesity hypoventilation syndrome on CPAP, chronic hypoxic and hypercapnic respiratory failure, non-Hodgkin's lymphoma in remission chronic lymphedema B12 deficiency who presents to [facility] emergency department with bilateral lower extremity edema much worse than her baseline lymphedema.    Upon evaluation in the emergency department chest x-ray revealed bilateral pleural effusions.  Clinically the patient was additionally felt to be suffering from cellulitis of the bilateral lower extremities due to open wounds of the legs due to worsening edema.  Patient was therefore initiated on intravenous Lasix  and vancomycin .  Hospitalist group was then called to assess the patient for admission to hospital.    Patient was managed with intravenous Lasix  resulting in excellent diuresis throughout the hospitalization.  Associated electrolyte abnormalities were replaced.  Strict input and output monitoring was performed as well as daily weights.   Concurrently, patient's cellulitis was treated with intravenous vancomycin  which was later transitioned to oral doxycycline .  Wound care nurse team evaluated the patient's associated bilateral lower extremity wounds and daily dressing changes were recommended and performed daily.   Patient's shortness of breath swelling and bilateral lower extremity pain all gradually improved throughout the hospitalization.  Patient was evaluated by physical therapy during this hospitalization and they recommended the patient would benefit from skilled services in a skilled nursing facility.  Assessment & Plan:   Acute on chronic heart failure with preserved ejection fraction (HFpEF)  Patient presenting with  progressive lower extremity edema which is not worse than her baseline lymphedema.  BNP elevated 409.7.  TTE w/ LVEF 50-55%, LV w/ global hypokinesis, indeterminate diastolic parameters, no AS, IVC dialted. Patient was diuresed with IV furosemide  with excellent urine output with decreased weight. -- Net negative 22.2L since admission -- Wt 169.9>>>158.4kg -- IV furosemide  transition to torsemide 40 mg p.o. daily -- Continue strict I's and O's and daily weights -- BMP in am  Cellulitis of lower extremity with chronic  lymphedema -- Keflex 500 mg p.o q6h x 7 days (Day #4) -- Doxycycline  100 mg PO BID (Day #7)  Lymphedema of both lower extremities Severe bilateral lower extremity lymphedema but seems to have been exacerbated by cardiogenic volume overload. Patient will need close outpatient follow-up with the lymphedema clinic after discharge.  Ulcers of both lower legs Secondary to multifactorial edema of the bilateral lower extremities leading to skin breakdown and ulcer formation.  Seen by wound care RN. -- Continue daily dressing changes  Essential hypertension -- Continue metoprolol  tartrate 50 mg p.o. twice daily  OSA (obstructive sleep apnea) -- CPAP qHS  Prediabetes Hemoglobin A1c 5.9% on 06/19/2023.  Continue metformin  500 mg p.o. twice daily  Class 3 severe obesity due to excess calories with serious comorbidity  Body mass index is 53.1 kg/m.  Generalized weakness/debility/deconditioning Seen by PT and OT with recommendation of SNF placement -- TOC consulted for SNF placement   DVT prophylaxis:     Code Status: Full Code Family Communication: No family present at bedside this morning  Disposition Plan:  Level of care: Telemetry Status is: Inpatient Remains inpatient appropriate because: Pending SNF placement    Consultants:  None  Procedures:  TTE  Antimicrobials:  Vancomycin  4/27 - 4/28 Doxycycline  4/29>> Keflex 5/2>>   Subjective: Patient seen  examined bedside, lying in  bed.  RN present at bedside.  Edema to lower extremities much improved, erythema also improved.  Continues on oral antibiotics.  Awaiting SNF placement.  No other specific questions, concerns or complaints at this time.  Denies headache, no dizziness, no chest pain, no palpitations, no shortness of breath, no abdominal pain, no fever/chills/night sweats, no nausea/vomiting/diarrhea, no focal weakness, no fatigue, no paresthesia.  No acute events overnight per nursing.  Objective: Vitals:   06/26/23 0417 06/26/23 0828 06/26/23 0852 06/26/23 1344  BP: (!) 150/73  128/60 (!) 139/90  Pulse: 64  71 66  Resp: 16  20 20   Temp: 97.7 F (36.5 C)   98 F (36.7 C)  TempSrc: Oral   Oral  SpO2: 99% 92% 98% 100%  Weight: (!) 158.4 kg     Height:        Intake/Output Summary (Last 24 hours) at 06/26/2023 1611 Last data filed at 06/26/2023 1300 Gross per 24 hour  Intake 840 ml  Output 2350 ml  Net -1510 ml   Filed Weights   06/24/23 0500 06/25/23 0500 06/26/23 0417  Weight: (!) 160.8 kg (!) 159.3 kg (!) 158.4 kg    Examination:  Physical Exam: GEN: NAD, alert and oriented x 3, obese HEENT: NCAT, PERRL, EOMI, sclera clear, MMM PULM: CTAB w/o wheezes/crackles, normal respiratory effort CV: RRR w/o M/G/R GI: abd soft, NTND, NABS, no R/G/M MSK: + bilateral lower extremity peripheral edema, muscle strength globally intact 5/5 bilateral upper/lower extremities NEURO: CN II-XII intact, no focal deficits, sensation to light touch intact PSYCH: normal mood/affect Integumentary: Erythema noted to right lower extremity improving, left lower extremity with Kerlix dressing in place, clean/dry/intact, otherwise no other concerning rashes/lesions/wounds noted on exposed skin surfaces.             Data Reviewed: I have personally reviewed following labs and imaging studies  CBC: Recent Labs  Lab 06/23/23 0348 06/24/23 0443  WBC 6.5 6.0  NEUTROABS 4.6 3.5  HGB 14.5  10.9*  HCT 48.5* 37.1  MCV 104.5* 105.1*  PLT 211 252   Basic Metabolic Panel: Recent Labs  Lab 06/21/23 0405 06/22/23 0414 06/23/23 0624 06/24/23 0443 06/25/23 0408  NA 137 138 138 139 141  K 3.8 4.1 4.3 4.3 4.1  CL 99 99 97* 97* 95*  CO2 29 29 30  35* 36*  GLUCOSE 139* 145* 138* 144* 116*  BUN 23 23 25* 28* 30*  CREATININE 0.93 0.68 1.03* 1.04* 1.05*  CALCIUM 8.2* 8.3* 8.8* 8.8* 8.9  MG  --  1.9 1.8 1.8 1.8  PHOS  --   --   --   --  4.2   GFR: Estimated Creatinine Clearance: 75.5 mL/min (A) (by C-G formula based on SCr of 1.05 mg/dL (H)). Liver Function Tests: Recent Labs  Lab 06/22/23 0414 06/23/23 0624 06/24/23 0443 06/25/23 0408  AST 21 19 19   --   ALT 12 18 16   --   ALKPHOS 116 135* 126  --   BILITOT 0.9 0.8 0.7  --   PROT 6.4* 7.0 6.7  --   ALBUMIN  2.9* 3.2* 3.1* 3.2*   No results for input(s): "LIPASE", "AMYLASE" in the last 168 hours. No results for input(s): "AMMONIA" in the last 168 hours. Coagulation Profile: No results for input(s): "INR", "PROTIME" in the last 168 hours. Cardiac Enzymes: No results for input(s): "CKTOTAL", "CKMB", "CKMBINDEX", "TROPONINI" in the last 168 hours. BNP (last 3 results) No results for input(s): "PROBNP" in the last 8760 hours. HbA1C: No  results for input(s): "HGBA1C" in the last 72 hours. CBG: Recent Labs  Lab 06/25/23 1300 06/25/23 1613 06/25/23 2056 06/26/23 0733 06/26/23 1113  GLUCAP 170* 89 132* 105* 123*   Lipid Profile: No results for input(s): "CHOL", "HDL", "LDLCALC", "TRIG", "CHOLHDL", "LDLDIRECT" in the last 72 hours. Thyroid  Function Tests: No results for input(s): "TSH", "T4TOTAL", "FREET4", "T3FREE", "THYROIDAB" in the last 72 hours. Anemia Panel: No results for input(s): "VITAMINB12", "FOLATE", "FERRITIN", "TIBC", "IRON", "RETICCTPCT" in the last 72 hours. Sepsis Labs: No results for input(s): "PROCALCITON", "LATICACIDVEN" in the last 168 hours.  No results found for this or any previous visit  (from the past 240 hours).       Radiology Studies: No results found.      Scheduled Meds:  albuterol   2.5 mg Nebulization BID   cephALEXin  500 mg Oral Q6H   cholecalciferol   1,000 Units Oral Daily   doxycycline   100 mg Oral Q12H   enoxaparin  (LOVENOX ) injection  80 mg Subcutaneous Q24H   insulin aspart  0-15 Units Subcutaneous TID AC & HS   metFORMIN   500 mg Oral BID WC   metoprolol  tartrate  50 mg Oral BID   potassium chloride   20 mEq Oral BID   torsemide  40 mg Oral Daily   Continuous Infusions:   LOS: 7 days    Time spent: 52 minutes spent on 06/26/2023 caring for this patient face-to-face including chart review, ordering labs/tests, documenting, discussion with nursing staff, consultants, updating family and interview/physical exam    [facility] [place], DO [facility] Available via Epic secure chat 7am-7pm After these hours, please refer to coverage provider listed on [email or web address] 06/26/2023, 4:11 PM

## 2023-06-27 DIAGNOSIS — R262 Difficulty in walking, not elsewhere classified: Secondary | ICD-10-CM | POA: Diagnosis not present

## 2023-06-27 DIAGNOSIS — I5033 Acute on chronic diastolic (congestive) heart failure: Secondary | ICD-10-CM | POA: Diagnosis not present

## 2023-06-27 DIAGNOSIS — E876 Hypokalemia: Secondary | ICD-10-CM | POA: Diagnosis not present

## 2023-06-27 DIAGNOSIS — Z7401 Bed confinement status: Secondary | ICD-10-CM | POA: Diagnosis not present

## 2023-06-27 DIAGNOSIS — L03116 Cellulitis of left lower limb: Secondary | ICD-10-CM | POA: Diagnosis not present

## 2023-06-27 DIAGNOSIS — M6281 Muscle weakness (generalized): Secondary | ICD-10-CM | POA: Diagnosis not present

## 2023-06-27 DIAGNOSIS — R7303 Prediabetes: Secondary | ICD-10-CM | POA: Diagnosis not present

## 2023-06-27 DIAGNOSIS — I5042 Chronic combined systolic (congestive) and diastolic (congestive) heart failure: Secondary | ICD-10-CM | POA: Diagnosis not present

## 2023-06-27 DIAGNOSIS — J90 Pleural effusion, not elsewhere classified: Secondary | ICD-10-CM | POA: Diagnosis not present

## 2023-06-27 DIAGNOSIS — I890 Lymphedema, not elsewhere classified: Secondary | ICD-10-CM | POA: Diagnosis not present

## 2023-06-27 DIAGNOSIS — G4733 Obstructive sleep apnea (adult) (pediatric): Secondary | ICD-10-CM | POA: Diagnosis not present

## 2023-06-27 DIAGNOSIS — L03119 Cellulitis of unspecified part of limb: Secondary | ICD-10-CM | POA: Diagnosis not present

## 2023-06-27 DIAGNOSIS — L0390 Cellulitis, unspecified: Secondary | ICD-10-CM | POA: Diagnosis not present

## 2023-06-27 DIAGNOSIS — L03115 Cellulitis of right lower limb: Secondary | ICD-10-CM | POA: Diagnosis not present

## 2023-06-27 DIAGNOSIS — I10 Essential (primary) hypertension: Secondary | ICD-10-CM | POA: Diagnosis not present

## 2023-06-27 DIAGNOSIS — I5023 Acute on chronic systolic (congestive) heart failure: Secondary | ICD-10-CM | POA: Diagnosis not present

## 2023-06-27 DIAGNOSIS — J9612 Chronic respiratory failure with hypercapnia: Secondary | ICD-10-CM | POA: Diagnosis not present

## 2023-06-27 LAB — GLUCOSE, CAPILLARY
Glucose-Capillary: 109 mg/dL — ABNORMAL HIGH (ref 70–99)
Glucose-Capillary: 127 mg/dL — ABNORMAL HIGH (ref 70–99)
Glucose-Capillary: 104 mg/dL — ABNORMAL HIGH (ref 70–99)

## 2023-06-27 LAB — BASIC METABOLIC PANEL WITH GFR
Anion gap: 9 (ref 5–15)
BUN: 25 mg/dL — ABNORMAL HIGH (ref 8–23)
CO2: 37 mmol/L — ABNORMAL HIGH (ref 22–32)
Calcium: 9.3 mg/dL (ref 8.9–10.3)
Chloride: 94 mmol/L — ABNORMAL LOW (ref 98–111)
Creatinine, Ser: 1.05 mg/dL — ABNORMAL HIGH (ref 0.44–1.00)
GFR, Estimated: 56 mL/min — ABNORMAL LOW (ref >60)
Glucose, Bld: 120 mg/dL — ABNORMAL HIGH (ref 70–99)
Potassium: 4.1 mmol/L (ref 3.5–5.1)
Sodium: 140 mmol/L (ref 135–145)

## 2023-06-27 LAB — MAGNESIUM: Magnesium: 1.7 mg/dL (ref 1.7–2.4)

## 2023-06-27 MED ORDER — TORSEMIDE 40 MG PO TABS: 40.0000 mg | ORAL_TABLET | Freq: Every day | ORAL | Status: DC

## 2023-06-27 MED ORDER — DOXYCYCLINE HYCLATE 100 MG PO TABS: 100.0000 mg | ORAL_TABLET | Freq: Two times a day (BID) | ORAL | Status: AC

## 2023-06-27 MED ORDER — HYDROXYZINE HCL 25 MG PO TABS
25.0000 mg | ORAL_TABLET | Freq: Four times a day (QID) | ORAL | Status: DC | PRN
  Administered 2023-06-27 (×2): 25 mg via ORAL
  Filled 2023-06-27 (×3): qty 1

## 2023-06-27 MED ORDER — CEPHALEXIN 500 MG PO CAPS: 500.0000 mg | ORAL_CAPSULE | Freq: Four times a day (QID) | ORAL | Status: AC

## 2023-06-27 MED ORDER — DIPHENHYDRAMINE-ZINC ACETATE 2-0.1 % EX CREA: TOPICAL_CREAM | Freq: Every day | CUTANEOUS | Status: DC | PRN

## 2023-06-27 NOTE — Progress Notes (Signed)
Report called to [facility].  °

## 2023-06-27 NOTE — Discharge Summary (Signed)
 Physician Discharge Summary  Allison Short ZOX:[id number] DOB: January 10, 1951 DOA: 06/18/2023  PCP: Alto Atta, NP  Admit date: 06/18/2023 Discharge date: 06/27/2023  Admitted From: Home Disposition: SNF  Recommendations for Outpatient Follow-up:  Follow up with PCP in 1-2 weeks Furosemide  changed to torsemide 40 mg p.o. daily Please obtain BMP/CBC in one week  Discharge Condition: Stable CODE STATUS: Full code Diet recommendation: Heart healthy/consistent carbohydrate diet diet, 1200 mL fluid restriction  History of present illness:  Allison Short is a 73 y.o. female with past medical history significant for essential hypertension, morbid obesity, obstructive sleep apnea/obesity hypoventilation syndrome on CPAP, chronic hypoxic and hypercapnic respiratory failure, non-Hodgkin's lymphoma in remission chronic lymphedema B12 deficiency who presents to [facility] emergency department with bilateral lower extremity edema much worse than her baseline lymphedema.    Upon evaluation in the emergency department chest x-ray revealed bilateral pleural effusions.  Clinically the patient was additionally felt to be suffering from cellulitis of the bilateral lower extremities due to open wounds of the legs due to worsening edema.  Patient was therefore initiated on intravenous Lasix  and vancomycin .  Hospitalist group was then called to assess the patient for admission to hospital.    Patient was managed with intravenous Lasix  resulting in excellent diuresis throughout the hospitalization.  Associated electrolyte abnormalities were replaced.  Strict input and output monitoring was performed as well as daily weights.   Concurrently, patient's cellulitis was treated with intravenous vancomycin  which was later transitioned to oral doxycycline .  Wound care nurse team evaluated the patient's associated bilateral lower extremity wounds and daily dressing changes were recommended and performed daily.    Patient's shortness of breath swelling and bilateral lower extremity pain all gradually improved throughout the hospitalization.  Patient was evaluated by physical therapy during this hospitalization and they recommended the patient would benefit from skilled services in a skilled nursing facility.  Hospital course:  Acute on chronic heart failure with preserved ejection fraction (HFpEF)  Patient presenting with progressive lower extremity edema which is not worse than her baseline lymphedema.  BNP elevated 409.7.  TTE w/ LVEF 50-55%, LV w/ global hypokinesis, indeterminate diastolic parameters, no AS, IVC dialted. Patient was diuresed with IV furosemide  with excellent urine output with decreased weight.  Patient net -22.2 L since admission.  Weight improved from 169.9 kg to 158.4 kg at time of discharge.  Continue to micrograms p.o. daily.  1200 mL fluid restriction.  Repeat BMP 1 week.  Outpatient follow-up with PCP.  Continue to monitor frequent weights.   Cellulitis of lower extremity with chronic  lymphedema Continue Keflex 500 mg p.o q6h x 7 days and Doxycycline  100 mg PO BID to complete 10-day course   Lymphedema of both lower extremities Severe bilateral lower extremity lymphedema but seems to have been exacerbated by cardiogenic volume overload. Patient will need close outpatient follow-up with the lymphedema clinic after discharge.   Ulcers of both lower legs Secondary to multifactorial edema of the bilateral lower extremities leading to skin breakdown and ulcer formation.  Seen by wound care RN.  Continue daily dressing changes   Essential hypertension Continue metoprolol  tartrate 50 mg p.o. twice daily   OSA (obstructive sleep apnea) CPAP qHS   Prediabetes Hemoglobin A1c 5.9% on 06/19/2023.  Continue metformin  500 mg p.o. twice daily   Class 3 severe obesity due to excess calories with serious comorbidity  Body mass index is 51.72 kg/m.  Generalized  weakness/debility/deconditioning Seen by PT and OT with recommendation of SNF placement  Discharge Diagnoses:  Principal Problem:   Acute on chronic heart failure with preserved ejection fraction (HFpEF) (HCC) Active Problems:   Cellulitis of lower extremity with chronic  lymphedema   Essential hypertension   Lymphedema of both lower extremities   OSA (obstructive sleep apnea)   Prediabetes   Class 3 severe obesity due to excess calories with serious comorbidity and body mass index (BMI) of 50.0 to 59.9 in adult   Ulcers of both lower legs (HCC)   Generalized weakness    Discharge Instructions  Discharge Instructions     Call MD for:  difficulty breathing, headache or visual disturbances   Complete by: As directed    Call MD for:  extreme fatigue   Complete by: As directed    Call MD for:  persistant dizziness or light-headedness   Complete by: As directed    Call MD for:  persistant nausea and vomiting   Complete by: As directed    Call MD for:  severe uncontrolled pain   Complete by: As directed    Call MD for:  temperature >100.4   Complete by: As directed    Diet - low sodium heart healthy   Complete by: As directed    Diet - low sodium heart healthy   Complete by: As directed    Discharge wound care:   Complete by: As directed    Wound care  Daily     Comments: Cleanse B lower legs and dorsal feet (intact skin and ulcerations) with Vashe wound cleanser , do not rinse and allow to air dry. Apply silver hydrofiber   to foot and leg wounds daily (any weeping area), cover with ABD pad and secure with Kerlix roll gauze wrapped beginning just above toes and ending right below knees.  May apply Ace bandage wrapped in same fashion as Kerlix for light compression   Discharge wound care:   Complete by: As directed    Cleanse B lower legs and dorsal feet (intact skin and ulcerations) with Vashe wound cleanser, do not rinse and allow to air dry. Apply silver hydrofiber ( to foot  and leg wounds daily (any weeping area), cover with ABD pad and secure with Kerlix roll gauze wrapped beginning just above toes and ending right below knees.  May apply Ace bandage wrapped in same fashion as Kerlix for light compression   Increase activity slowly   Complete by: As directed    Increase activity slowly   Complete by: As directed       Allergies as of 06/27/2023       Reactions   Ciprofloxin Hcl [ciprofloxacin] Other (See Comments)   Unstable gait   Morphine And Codeine Anaphylaxis, Other (See Comments)   Tolerates hydrocodone    Asa [aspirin] Hives, Other (See Comments)   Hives can become SEVERE, requiring intervention   Chia Oil Swelling, Other (See Comments)   Possibly "chia tea" = Face and lips became swollen   Erythromycin Other (See Comments)   Reaction not recalled   Nsaids Other (See Comments)   Lowers GFR   Penicillins Other (See Comments)   Reaction not recalled- was told to "never take this again" by family        Medication List     STOP taking these medications    furosemide  20 MG tablet Commonly known as: LASIX        TAKE these medications    acetaminophen  500 MG tablet Commonly known as: TYLENOL  Take 1,000 mg by mouth every  8 (eight) hours as needed for mild pain, moderate pain or headache.   bisacodyl  5 MG EC tablet Generic drug: bisacodyl  Take 5 mg by mouth daily as needed for moderate constipation.   cephALEXin 500 MG capsule Commonly known as: KEFLEX Take 1 capsule (500 mg total) by mouth every 6 (six) hours for 5 days.   cyanocobalamin  1000 MCG tablet Commonly known as: VITAMIN B12 Take 1 tablet (1,000 mcg total) by mouth daily.   diphenhydrAMINE -zinc  acetate cream Commonly known as: BENADRYL  Apply topically daily as needed for itching.   doxycycline  100 MG tablet Commonly known as: VIBRA -TABS Take 1 tablet (100 mg total) by mouth every 12 (twelve) hours for 2 days.   hydrOXYzine  25 MG tablet Commonly known as:  ATARAX  Take 1 tablet (25 mg total) by mouth at bedtime as needed for anxiety.   metFORMIN  500 MG tablet Commonly known as: GLUCOPHAGE  Take 1 tablet (500 mg total) by mouth 2 (two) times daily with a meal.   metoprolol  tartrate 50 MG tablet Commonly known as: LOPRESSOR  Take 1 tablet (50 mg total) by mouth 2 (two) times daily.   potassium chloride  10 MEQ tablet Commonly known as: KLOR-CON  M Take 1 tablet (10 mEq total) by mouth daily.   sodium chloride  0.65 % Soln nasal spray Commonly known as: OCEAN Place 1 spray into both nostrils as needed for congestion.   Torsemide 40 MG Tabs Take 40 mg by mouth daily. Start taking on: Jun 28, 2023   Vitamin D3 1000 units Caps Take 1,000 Units by mouth daily.               Discharge Care Instructions  (From admission, onward)           Start     Ordered   06/27/23 0000  Discharge wound care:       Comments: Cleanse B lower legs and dorsal feet (intact skin and ulcerations) with Vashe wound cleanser, do not rinse and allow to air dry. Apply silver hydrofiber ( to foot and leg wounds daily (any weeping area), cover with ABD pad and secure with Kerlix roll gauze wrapped beginning just above toes and ending right below knees.  May apply Ace bandage wrapped in same fashion as Kerlix for light compression   06/27/23 1427   06/22/23 0000  Discharge wound care:       Comments: Wound care  Daily     Comments: Cleanse B lower legs and dorsal feet (intact skin and ulcerations) with Vashe wound cleanser , do not rinse and allow to air dry. Apply silver hydrofiber   to foot and leg wounds daily (any weeping area), cover with ABD pad and secure with Kerlix roll gauze wrapped beginning just above toes and ending right below knees.  May apply Ace bandage wrapped in same fashion as Kerlix for light compression   06/22/23 1427            Contact information for follow-up providers     Alto Atta, NP. Schedule an appointment as soon as  possible for a visit in 1 week(s).   Specialty: Family Medicine Contact information: [address] [place] [address] [phone]              Contact information for after-discharge care     Destination     [facility] Preferred SNF .   Service: Skilled Nursing Contact information: [employer] [place]  [address] [phone]  Allergies  Allergen Reactions   Ciprofloxin Hcl [Ciprofloxacin] Other (See Comments)    Unstable gait   Morphine And Codeine Anaphylaxis and Other (See Comments)    Tolerates hydrocodone    Asa [Aspirin] Hives and Other (See Comments)    Hives can become SEVERE, requiring intervention   Chia Oil Swelling and Other (See Comments)    Possibly "chia tea" = Face and lips became swollen   Erythromycin Other (See Comments)    Reaction not recalled   Nsaids Other (See Comments)    Lowers GFR   Penicillins Other (See Comments)    Reaction not recalled- was told to "never take this again" by family    Consultations: none   Procedures/Studies: DG Chest 1 View Result Date: 06/24/2023 CLINICAL DATA:  Dyspnea EXAM: CHEST  1 VIEW COMPARISON:  None Available. FINDINGS: Linear atelectasis within the left mid lung zone. Lungs are otherwise clear. No pneumothorax or pleural effusion. Stable mild cardiomegaly. Pulmonary vascularity is normal. No acute bone abnormality. IMPRESSION: 1. No active disease. Mild cardiomegaly. Electronically Signed   By: Worthy Heads M.D.   On: 06/24/2023 12:42   ECHOCARDIOGRAM COMPLETE Result Date: 06/19/2023    ECHOCARDIOGRAM REPORT   Patient Name:   MARGIE PHILBRICK Date of Exam: 06/19/2023 Medical Rec #:  [id number]  Height:       68.0 in Accession #:    [id number] Weight:       374.6 lb Date of Birth:  March 07, 1950   BSA:          2.668 m Patient Age:    72 years   BP:           153/70 mmHg Patient Gender: F          HR:           84 bpm. Exam  Location:  Inpatient Procedure: 2D Echo, Cardiac Doppler, Color Doppler and Intracardiac            Opacification Agent (Both Spectral and Color Flow Doppler were            utilized during procedure). Indications:    CHF-acute diastolic  History:        Patient has prior history of Echocardiogram examinations, most                 recent 04/28/2022. Risk Factors:Hypertension and Sleep Apnea.  Sonographer:    Juanita Shaw Referring Phys: [id number] EXNTZGYFV RATHORE  Sonographer Comments: Technically difficult study due to poor echo windows, suboptimal apical window, suboptimal parasternal window and suboptimal subcostal window. Image acquisition challenging due to patient body habitus. IMPRESSIONS  1. Left ventricular ejection fraction, by estimation, is 50 to 55%. The left ventricle has low normal function. The left ventricle demonstrates global hypokinesis. Left ventricular diastolic parameters are indeterminate.  2. Right ventricular systolic function is normal. The right ventricular size is visually enlarged.  3. The mitral valve is degenerative. No evidence of mitral valve regurgitation. No evidence of mitral stenosis.  4. The aortic valve is grossly normal. Aortic valve regurgitation is not visualized. Aortic valve sclerosis is present, with no evidence of aortic valve stenosis.  5. The inferior vena cava is dilated in size with <50% respiratory variability, suggesting right atrial pressure of 15 mmHg. FINDINGS  Left Ventricle: Left ventricular ejection fraction, by estimation, is 50 to 55%. The left ventricle has low normal function. The left ventricle demonstrates global hypokinesis. The left ventricular internal cavity size was normal in size. There is no  left ventricular hypertrophy. Left ventricular diastolic parameters are indeterminate. Right Ventricle: The right ventricular size is visually enlarged. No increase in right ventricular wall thickness. Right ventricular systolic function is normal. Left  Atrium: Left atrial size was normal in size. Right Atrium: Right atrial size was normal in size. Pericardium: There is no evidence of pericardial effusion. Mitral Valve: The mitral valve is degenerative in appearance. No evidence of mitral valve regurgitation. No evidence of mitral valve stenosis. MV peak gradient, 4.2 mmHg. The mean mitral valve gradient is 2.0 mmHg. Tricuspid Valve: The tricuspid valve is normal in structure. Tricuspid valve regurgitation is trivial. No evidence of tricuspid stenosis. Aortic Valve: The aortic valve is grossly normal. Aortic valve regurgitation is not visualized. Aortic valve sclerosis is present, with no evidence of aortic valve stenosis. Aortic valve mean gradient measures 7.0 mmHg. Aortic valve peak gradient measures 11.7 mmHg. Aortic valve area, by VTI measures 2.08 cm. Pulmonic Valve: The pulmonic valve was not well visualized. Pulmonic valve regurgitation is trivial. No evidence of pulmonic stenosis. Aorta: The aortic root and ascending aorta are structurally normal, with no evidence of dilitation. Venous: The inferior vena cava is dilated in size with less than 50% respiratory variability, suggesting right atrial pressure of 15 mmHg. IAS/Shunts: The interatrial septum was not well visualized.  LEFT VENTRICLE PLAX 2D LVIDd:         5.20 cm      Diastology LVIDs:         3.60 cm      LV e' medial:    5.77 cm/s LV PW:         0.90 cm      LV E/e' medial:  15.9 LV IVS:        0.90 cm      LV e' lateral:   9.46 cm/s LVOT diam:     2.20 cm      LV E/e' lateral: 9.7 LV SV:         70 LV SV Index:   26 LVOT Area:     3.80 cm  LV Volumes (MOD) LV vol d, MOD A4C: 154.0 ml LV vol s, MOD A4C: 73.3 ml LV SV MOD A4C:     154.0 ml RIGHT VENTRICLE             IVC RV S prime:     11.10 cm/s  IVC diam: 2.30 cm TAPSE (M-mode): 2.5 cm LEFT ATRIUM             Index        RIGHT ATRIUM           Index LA diam:        4.30 cm 1.61 cm/m   RA Area:     14.40 cm LA Vol (A2C):   86.0 ml 32.23  ml/m  RA Volume:   35.10 ml  13.15 ml/m LA Vol (A4C):   67.0 ml 25.11 ml/m LA Biplane Vol: 79.2 ml 29.68 ml/m  AORTIC VALVE                     PULMONIC VALVE AV Area (Vmax):    2.02 cm      PV Vmax:       0.98 m/s AV Area (Vmean):   2.03 cm      PV Peak grad:  3.9 mmHg AV Area (VTI):     2.08 cm AV Vmax:           171.00 cm/s  AV Vmean:          121.000 cm/s AV VTI:            0.335 m AV Peak Grad:      11.7 mmHg AV Mean Grad:      7.0 mmHg LVOT Vmax:         90.80 cm/s LVOT Vmean:        64.700 cm/s LVOT VTI:          0.183 m LVOT/AV VTI ratio: 0.55  AORTA Ao Root diam: 2.80 cm Ao Asc diam:  3.10 cm MITRAL VALVE MV Area (PHT): 3.93 cm    SHUNTS MV Area VTI:   3.00 cm    Systemic VTI:  0.18 m MV Peak grad:  4.2 mmHg    Systemic Diam: 2.20 cm MV Mean grad:  2.0 mmHg MV Vmax:       1.03 m/s MV Vmean:      64.8 cm/s MV E velocity: 91.90 cm/s MV A velocity: 70.00 cm/s MV E/A ratio:  1.31 Sunit Tolia Electronically signed by Olinda Bertrand Signature Date/Time: 06/19/2023/3:33:21 PM    Final    DG Chest 2 View Result Date: 06/18/2023 CLINICAL DATA:  Fluid overload EXAM: CHEST - 2 VIEW COMPARISON:  None Available. FINDINGS: Lungs are clear. No pneumothorax. Small bilateral pleural effusions are present. Cardiac size is enlarged unchanged. Pulmonary vascularity is. Osseous structures are age-appropriate. No acute bone abnormality. IMPRESSION: 1. Small bilateral pleural effusions. 2. Stable cardiomegaly. Electronically Signed   By: Worthy Heads M.D.   On: 06/18/2023 23:48     Subjective: Patient seen examined bedside, lying in bed. RN present at bedside. Edema to lower extremities much improved, erythema also improved. Continues on oral antibiotics. No other specific questions, concerns or complaints at this time. Denies headache, no dizziness, no chest pain, no palpitations, no shortness of breath, no abdominal pain, no fever/chills/night sweats, no nausea/vomiting/diarrhea, no focal weakness, no fatigue, no  paresthesia. No acute events overnight per nursing staff.  Discharging to SNF today.  Discharge Exam: Vitals:   06/27/23 0932 06/27/23 1223  BP: (!) 159/70 (!) 140/68  Pulse: 72 65  Resp: 16 18  Temp:  98.1 F (36.7 C)  SpO2: 95% 96%   Vitals:   06/27/23 0700 06/27/23 0911 06/27/23 0932 06/27/23 1223  BP:   (!) 159/70 (!) 140/68  Pulse:   72 65  Resp:   16 18  Temp:    98.1 F (36.7 C)  TempSrc:    Oral  SpO2:  98% 95% 96%  Weight: (!) 154.3 kg     Height:        Physical Exam: GEN: NAD, alert and oriented x 3, obese HEENT: NCAT, PERRL, EOMI, sclera clear, MMM PULM: CTAB w/o wheezes/crackles, normal respiratory effort CV: RRR w/o M/G/R GI: abd soft, NTND, NABS, no R/G/M MSK: + bilateral lower extremity peripheral edema, muscle strength globally intact 5/5 bilateral upper/lower extremities NEURO: CN II-XII intact, no focal deficits, sensation to light touch intact PSYCH: normal mood/affect Integumentary: Erythema noted to right lower extremity improving, left lower extremity with Kerlix dressing in place, clean/dry/intact, otherwise no other concerning rashes/lesions/wounds noted on exposed skin surfaces.          The results of significant diagnostics from this hospitalization (including imaging, microbiology, ancillary and laboratory) are listed below for reference.     Microbiology: No results found for this or any previous visit (from the past 240 hours).   Labs: BNP (last  3 results) Recent Labs    06/18/23 0013 06/24/23 0443  BNP 367.1* 409.7*   Basic Metabolic Panel: Recent Labs  Lab 06/22/23 0414 06/23/23 0624 06/24/23 0443 06/25/23 0408 06/27/23 0442  NA 138 138 139 141 140  K 4.1 4.3 4.3 4.1 4.1  CL 99 97* 97* 95* 94*  CO2 29 30 35* 36* 37*  GLUCOSE 145* 138* 144* 116* 120*  BUN 23 25* 28* 30* 25*  CREATININE 0.68 1.03* 1.04* 1.05* 1.05*  CALCIUM 8.3* 8.8* 8.8* 8.9 9.3  MG 1.9 1.8 1.8 1.8 1.7  PHOS  --   --   --  4.2  --    Liver  Function Tests: Recent Labs  Lab 06/22/23 0414 06/23/23 0624 06/24/23 0443 06/25/23 0408  AST 21 19 19   --   ALT 12 18 16   --   ALKPHOS 116 135* 126  --   BILITOT 0.9 0.8 0.7  --   PROT 6.4* 7.0 6.7  --   ALBUMIN  2.9* 3.2* 3.1* 3.2*   No results for input(s): "LIPASE", "AMYLASE" in the last 168 hours. No results for input(s): "AMMONIA" in the last 168 hours. CBC: Recent Labs  Lab 06/23/23 0348 06/24/23 0443  WBC 6.5 6.0  NEUTROABS 4.6 3.5  HGB 14.5 10.9*  HCT 48.5* 37.1  MCV 104.5* 105.1*  PLT 211 252   Cardiac Enzymes: No results for input(s): "CKTOTAL", "CKMB", "CKMBINDEX", "TROPONINI" in the last 168 hours. BNP: Invalid input(s): "POCBNP" CBG: Recent Labs  Lab 06/26/23 1113 06/26/23 1615 06/26/23 2144 06/27/23 0721 06/27/23 1146  GLUCAP 123* 100* 124* 109* 127*   D-Dimer No results for input(s): "DDIMER" in the last 72 hours. Hgb A1c No results for input(s): "HGBA1C" in the last 72 hours. Lipid Profile No results for input(s): "CHOL", "HDL", "LDLCALC", "TRIG", "CHOLHDL", "LDLDIRECT" in the last 72 hours. Thyroid  function studies No results for input(s): "TSH", "T4TOTAL", "T3FREE", "THYROIDAB" in the last 72 hours.  Invalid input(s): "FREET3" Anemia work up No results for input(s): "VITAMINB12", "FOLATE", "FERRITIN", "TIBC", "IRON", "RETICCTPCT" in the last 72 hours. Urinalysis    Component Value Date/Time   COLORURINE YELLOW 04/14/2022 1608   APPEARANCEUR CLEAR 04/14/2022 1608   LABSPEC 1.010 04/14/2022 1608   PHURINE 5.0 04/14/2022 1608   GLUCOSEU NEGATIVE 04/14/2022 1608   HGBUR NEGATIVE 04/14/2022 1608   BILIRUBINUR NEGATIVE 04/14/2022 1608   KETONESUR NEGATIVE 04/14/2022 1608   PROTEINUR NEGATIVE 04/14/2022 1608   NITRITE NEGATIVE 04/14/2022 1608   LEUKOCYTESUR NEGATIVE 04/14/2022 1608   Sepsis Labs Recent Labs  Lab 06/23/23 0348 06/24/23 0443  WBC 6.5 6.0   Microbiology No results found for this or any previous visit (from the past  240 hours).   Time coordinating discharge: Over 30 minutes  SIGNED:   Rema Care [place], DO  [facility] 06/27/2023, 2:28 PM

## 2023-06-27 NOTE — Progress Notes (Signed)
 Called [facility] to give report. Placed on hold for 18 minutes. Unsuccessful attempt to give report. PT[facility] has been called.

## 2023-06-27 NOTE — TOC Transition Note (Addendum)
 Transition of Care [facility]) - Discharge Note   Patient Details  Name: Allison Short MRN: [id number] Date of Birth: 02-12-1951  Transition of Care [facility] CM/SW Contact:  Gertha Ku, LCSW Phone Number: 06/27/2023, 2:32 PM   Clinical Narrative:    Pt's insurance [employer] was approved for [facility] [id number] 06/27/23-06/29/23. Pt to d/c to [facility] room [facility], RN to call report to [phone]. PTAR called no further TOC needs , TOC sign off.    Final next level of care: Skilled Nursing Facility Barriers to Discharge: Barriers Resolved   Patient Goals and CMS Choice Patient states their goals for this hospitalization and ongoing recovery are:: SNF to get stonger          Discharge Placement              Patient chooses bed at: [facility] Patient to be transferred to facility by: EMS   Patient and family notified of of transfer: 06/27/23  Discharge Plan and Services Additional resources added to the After Visit Summary for                                       Social Drivers of Health (SDOH) Interventions SDOH Screenings   Food Insecurity: No Food Insecurity (06/19/2023)  Housing: Low Risk  (06/19/2023)  Transportation Needs: Unmet Transportation Needs (06/22/2023)  Utilities: Not At Risk (06/19/2023)  Alcohol Screen: Low Risk  (05/29/2023)  Depression (PHQ2-9): Low Risk  (05/29/2023)  Financial Resource Strain: Low Risk  (05/29/2023)  Physical Activity: Inactive (05/29/2023)  Social Connections: Moderately Isolated (06/19/2023)  Stress: No Stress Concern Present (05/29/2023)  Tobacco Use: Low Risk  (06/18/2023)  Health Literacy: Adequate Health Literacy (05/29/2023)     Readmission Risk Interventions     No data to display

## 2023-06-27 NOTE — Progress Notes (Signed)
 PROGRESS NOTE    Allison Short  ZOX:[id number] DOB: 02/25/1950 DOA: 06/18/2023 PCP: Alto Atta, NP    Brief Narrative:   Allison Short is a 73 y.o. female with past medical history significant for essential hypertension, morbid obesity, obstructive sleep apnea/obesity hypoventilation syndrome on CPAP, chronic hypoxic and hypercapnic respiratory failure, non-Hodgkin's lymphoma in remission chronic lymphedema B12 deficiency who presents to [facility] emergency department with bilateral lower extremity edema much worse than her baseline lymphedema.    Upon evaluation in the emergency department chest x-ray revealed bilateral pleural effusions.  Clinically the patient was additionally felt to be suffering from cellulitis of the bilateral lower extremities due to open wounds of the legs due to worsening edema.  Patient was therefore initiated on intravenous Lasix  and vancomycin .  Hospitalist group was then called to assess the patient for admission to hospital.    Patient was managed with intravenous Lasix  resulting in excellent diuresis throughout the hospitalization.  Associated electrolyte abnormalities were replaced.  Strict input and output monitoring was performed as well as daily weights.   Concurrently, patient's cellulitis was treated with intravenous vancomycin  which was later transitioned to oral doxycycline .  Wound care nurse team evaluated the patient's associated bilateral lower extremity wounds and daily dressing changes were recommended and performed daily.   Patient's shortness of breath swelling and bilateral lower extremity pain all gradually improved throughout the hospitalization.  Patient was evaluated by physical therapy during this hospitalization and they recommended the patient would benefit from skilled services in a skilled nursing facility.  Assessment & Plan:   Acute on chronic heart failure with preserved ejection fraction (HFpEF)  Patient presenting with  progressive lower extremity edema which is not worse than her baseline lymphedema.  BNP elevated 409.7.  TTE w/ LVEF 50-55%, LV w/ global hypokinesis, indeterminate diastolic parameters, no AS, IVC dialted. Patient was diuresed with IV furosemide  with excellent urine output with decreased weight. -- Net negative 22.2L since admission -- Wt 169.9>>>158.4kg -- IV furosemide  transition to torsemide 40 mg p.o. daily -- Continue strict I's and O's and daily weights -- BMP in am  Cellulitis of lower extremity with chronic  lymphedema -- Keflex 500 mg p.o q6h x 7 days (Day #5) -- Doxycycline  100 mg PO BID (Day #8) -- Plan 10-day course  Lymphedema of both lower extremities Severe bilateral lower extremity lymphedema but seems to have been exacerbated by cardiogenic volume overload. Patient will need close outpatient follow-up with the lymphedema clinic after discharge.  Ulcers of both lower legs Secondary to multifactorial edema of the bilateral lower extremities leading to skin breakdown and ulcer formation.  Seen by wound care RN. -- Continue daily dressing changes  Essential hypertension -- Continue metoprolol  tartrate 50 mg p.o. twice daily  OSA (obstructive sleep apnea) -- CPAP qHS  Prediabetes Hemoglobin A1c 5.9% on 06/19/2023.  Continue metformin  500 mg p.o. twice daily  Class 3 severe obesity due to excess calories with serious comorbidity  Body mass index is 51.72 kg/m.  Generalized weakness/debility/deconditioning Seen by PT and OT with recommendation of SNF placement -- TOC consulted for SNF placement; insurance authorization pending   DVT prophylaxis:     Code Status: Full Code Family Communication: No family present at bedside this morning  Disposition Plan:  Level of care: Med-Surg Status is: Inpatient Remains inpatient appropriate because: Pending SNF placement    Consultants:  None  Procedures:  TTE  Antimicrobials:  Vancomycin  4/27 - 4/28 Doxycycline   4/29>> Keflex 5/2>>  Subjective: Patient seen examined bedside, lying in bed.  Cellulitis continues to slowly improve, continues on oral antibiotics.  Awaiting insurance authorization for SNF placement.  No other specific questions, concerns or complaints at this time.  Denies headache, no dizziness, no chest pain, no palpitations, no shortness of breath, no abdominal pain, no fever/chills/night sweats, no nausea/vomiting/diarrhea, no focal weakness, no fatigue, no paresthesia.  No acute events overnight per nursing.  Objective: Vitals:   06/27/23 0700 06/27/23 0911 06/27/23 0932 06/27/23 1223  BP:   (!) 159/70 (!) 140/68  Pulse:   72 65  Resp:   16 18  Temp:    98.1 F (36.7 C)  TempSrc:    Oral  SpO2:  98% 95% 96%  Weight: (!) 154.3 kg     Height:        Intake/Output Summary (Last 24 hours) at 06/27/2023 1344 Last data filed at 06/27/2023 1154 Gross per 24 hour  Intake 840 ml  Output 3050 ml  Net -2210 ml   Filed Weights   06/25/23 0500 06/26/23 0417 06/27/23 0700  Weight: (!) 159.3 kg (!) 158.4 kg (!) 154.3 kg    Examination:  Physical Exam: GEN: NAD, alert and oriented x 3, obese HEENT: NCAT, PERRL, EOMI, sclera clear, MMM PULM: CTAB w/o wheezes/crackles, normal respiratory effort CV: RRR w/o M/G/R GI: abd soft, NTND, NABS, no R/G/M MSK: + bilateral lower extremity peripheral edema, muscle strength globally intact 5/5 bilateral upper/lower extremities NEURO: CN II-XII intact, no focal deficits, sensation to light touch intact PSYCH: normal mood/affect Integumentary: Erythema noted to right lower extremity improving, left lower extremity with Kerlix dressing in place, clean/dry/intact, otherwise no other concerning rashes/lesions/wounds noted on exposed skin surfaces.             Data Reviewed: I have personally reviewed following labs and imaging studies  CBC: Recent Labs  Lab 06/23/23 0348 06/24/23 0443  WBC 6.5 6.0  NEUTROABS 4.6 3.5  HGB 14.5 10.9*   HCT 48.5* 37.1  MCV 104.5* 105.1*  PLT 211 252   Basic Metabolic Panel: Recent Labs  Lab 06/22/23 0414 06/23/23 0624 06/24/23 0443 06/25/23 0408 06/27/23 0442  NA 138 138 139 141 140  K 4.1 4.3 4.3 4.1 4.1  CL 99 97* 97* 95* 94*  CO2 29 30 35* 36* 37*  GLUCOSE 145* 138* 144* 116* 120*  BUN 23 25* 28* 30* 25*  CREATININE 0.68 1.03* 1.04* 1.05* 1.05*  CALCIUM 8.3* 8.8* 8.8* 8.9 9.3  MG 1.9 1.8 1.8 1.8 1.7  PHOS  --   --   --  4.2  --    GFR: Estimated Creatinine Clearance: 74.3 mL/min (A) (by C-G formula based on SCr of 1.05 mg/dL (H)). Liver Function Tests: Recent Labs  Lab 06/22/23 0414 06/23/23 0624 06/24/23 0443 06/25/23 0408  AST 21 19 19   --   ALT 12 18 16   --   ALKPHOS 116 135* 126  --   BILITOT 0.9 0.8 0.7  --   PROT 6.4* 7.0 6.7  --   ALBUMIN  2.9* 3.2* 3.1* 3.2*   No results for input(s): "LIPASE", "AMYLASE" in the last 168 hours. No results for input(s): "AMMONIA" in the last 168 hours. Coagulation Profile: No results for input(s): "INR", "PROTIME" in the last 168 hours. Cardiac Enzymes: No results for input(s): "CKTOTAL", "CKMB", "CKMBINDEX", "TROPONINI" in the last 168 hours. BNP (last 3 results) No results for input(s): "PROBNP" in the last 8760 hours. HbA1C: No results for input(s): "HGBA1C" in  the last 72 hours. CBG: Recent Labs  Lab 06/26/23 1113 06/26/23 1615 06/26/23 2144 06/27/23 0721 06/27/23 1146  GLUCAP 123* 100* 124* 109* 127*   Lipid Profile: No results for input(s): "CHOL", "HDL", "LDLCALC", "TRIG", "CHOLHDL", "LDLDIRECT" in the last 72 hours. Thyroid  Function Tests: No results for input(s): "TSH", "T4TOTAL", "FREET4", "T3FREE", "THYROIDAB" in the last 72 hours. Anemia Panel: No results for input(s): "VITAMINB12", "FOLATE", "FERRITIN", "TIBC", "IRON", "RETICCTPCT" in the last 72 hours. Sepsis Labs: No results for input(s): "PROCALCITON", "LATICACIDVEN" in the last 168 hours.  No results found for this or any previous visit  (from the past 240 hours).       Radiology Studies: No results found.      Scheduled Meds:  albuterol   2.5 mg Nebulization BID   cephALEXin  500 mg Oral Q6H   cholecalciferol   1,000 Units Oral Daily   doxycycline   100 mg Oral Q12H   enoxaparin  (LOVENOX ) injection  80 mg Subcutaneous Q24H   insulin aspart  0-15 Units Subcutaneous TID AC & HS   metFORMIN   500 mg Oral BID WC   metoprolol  tartrate  50 mg Oral BID   potassium chloride   20 mEq Oral BID   torsemide  40 mg Oral Daily   Continuous Infusions:   LOS: 8 days    Time spent: 45 minutes spent on 06/27/2023 caring for this patient face-to-face including chart review, ordering labs/tests, documenting, discussion with nursing staff, consultants, updating family and interview/physical exam    Rema Care [place], DO [facility] Available via Epic secure chat 7am-7pm After these hours, please refer to coverage provider listed on [email or web address] 06/27/2023, 1:44 PM

## 2023-06-27 NOTE — Progress Notes (Signed)
 Occupational Therapy Treatment Patient Details Name: Flannery Smithart MRN: [id number] DOB: 1950-04-22 Today's Date: 06/27/2023   History of present illness 73 yr old female admitted with acute on chronic HF, LE celluliltis. Hx of lymphedema, chronic LE edema, CHF, OSA, morbid obesity, NHL, B TKA   OT comments  Pt gently declined attempts at edge of bed or out of bed activity, stating she was recently cleaned and repositioned after her purewick was out of place. She was however amendable to education on therapeutic exercises for strengthening needed to facilitate improved ADL performance. She used a mod resistance exercise band to perform multiple reps of vertical pulls and lateral exchanges, as well as lower body exercises. She presented with good effort. Slight fatigue noted with activity. Continue OT plan of care. Patient will benefit from continued inpatient follow up therapy, <3 hours/day.       If plan is discharge home, recommend the following:  A lot of help with walking and/or transfers;A lot of help with bathing/dressing/bathroom;Assistance with cooking/housework;Assist for transportation   Equipment Recommendations  Other (comment) (defer to next level of care)    Recommendations for Other Services      Precautions / Restrictions Precautions Precautions: Fall Restrictions Weight Bearing Restrictions Per Provider Order: No Other Position/Activity Restrictions: chronic lymphedema        ADL either performed or assessed with clinical judgement     Vision Baseline Vision/History: 1 Wears glasses           Communication Communication Communication: No apparent difficulties   Cognition Arousal: Alert Behavior During Therapy: WFL for tasks assessed/performed          Following commands: Intact                      Pertinent Vitals/ Pain       Pain Assessment Pain Assessment: No/denies pain   Frequency  Min 2X/week        Progress Toward Goals  OT  Goals(current goals can now be found in the care plan section)  Progress towards OT goals: Progressing toward goals  Acute Rehab OT Goals Patient Stated Goal: to get better and return home OT Goal Formulation: With patient Time For Goal Achievement: 07/04/23 Potential to Achieve Goals: Good  Plan         AM-PAC OT "6 Clicks" Daily Activity     Outcome Measure   Help from another person eating meals?: None Help from another person taking care of personal grooming?: A Little Help from another person toileting, which includes using toliet, bedpan, or urinal?: A Lot Help from another person bathing (including washing, rinsing, drying)?: A Lot Help from another person to put on and taking off regular upper body clothing?: A Little Help from another person to put on and taking off regular lower body clothing?: A Lot 6 Click Score: 16    End of Session Equipment Utilized During Treatment: Other (comment) (N/A)  OT Visit Diagnosis: Muscle weakness (generalized) (M62.81);Unsteadiness on feet (R26.81)   Activity Tolerance Patient tolerated treatment well   Patient Left in bed;with call bell/phone within reach;with bed alarm set   Nurse Communication Other (comment)        Time: 7829-5621 OT Time Calculation (min): 12 min  Charges: OT General Charges $OT Visit: 1 Visit OT Treatments $Therapeutic Activity: 8-22 mins     Sheralyn Dies, OTR/L 06/27/2023, 4:37 PM

## 2023-06-27 NOTE — TOC Progression Note (Signed)
 Transition of Care [facility]) - Progression Note    Patient Details  Name: Allison Short MRN: [id number] Date of Birth: 1950-12-07  [facility] CM/SW Contact  Gertha Ku, LCSW Phone Number: 06/27/2023, 10:38 AM  Clinical Narrative:     The pt's insurance authorization for [facility] has been reinitiated. Authorization is currently pending. TOC to follow.  Expected Discharge Plan: Skilled Nursing Facility Barriers to Discharge: Continued Medical Work up  Expected Discharge Plan and Services       Living arrangements for the past 2 months: Apartment Expected Discharge Date: 06/22/23                                     Social Determinants of Health (SDOH) Interventions SDOH Screenings   Food Insecurity: No Food Insecurity (06/19/2023)  Housing: Low Risk  (06/19/2023)  Transportation Needs: Unmet Transportation Needs (06/22/2023)  Utilities: Not At Risk (06/19/2023)  Alcohol Screen: Low Risk  (05/29/2023)  Depression (PHQ2-9): Low Risk  (05/29/2023)  Financial Resource Strain: Low Risk  (05/29/2023)  Physical Activity: Inactive (05/29/2023)  Social Connections: Moderately Isolated (06/19/2023)  Stress: No Stress Concern Present (05/29/2023)  Tobacco Use: Low Risk  (06/18/2023)  Health Literacy: Adequate Health Literacy (05/29/2023)    Readmission Risk Interventions     No data to display

## 2023-06-28 DIAGNOSIS — E876 Hypokalemia: Secondary | ICD-10-CM | POA: Diagnosis not present

## 2023-06-28 DIAGNOSIS — G4733 Obstructive sleep apnea (adult) (pediatric): Secondary | ICD-10-CM | POA: Diagnosis not present

## 2023-06-28 DIAGNOSIS — I10 Essential (primary) hypertension: Secondary | ICD-10-CM | POA: Diagnosis not present

## 2023-06-28 DIAGNOSIS — I890 Lymphedema, not elsewhere classified: Secondary | ICD-10-CM | POA: Diagnosis not present

## 2023-06-28 DIAGNOSIS — L03119 Cellulitis of unspecified part of limb: Secondary | ICD-10-CM | POA: Diagnosis not present

## 2023-06-28 DIAGNOSIS — R7303 Prediabetes: Secondary | ICD-10-CM | POA: Diagnosis not present

## 2023-06-28 DIAGNOSIS — I5042 Chronic combined systolic (congestive) and diastolic (congestive) heart failure: Secondary | ICD-10-CM | POA: Diagnosis not present

## 2023-06-28 DIAGNOSIS — L0390 Cellulitis, unspecified: Secondary | ICD-10-CM | POA: Diagnosis not present

## 2023-06-30 DIAGNOSIS — E876 Hypokalemia: Secondary | ICD-10-CM | POA: Diagnosis not present

## 2023-06-30 DIAGNOSIS — G4733 Obstructive sleep apnea (adult) (pediatric): Secondary | ICD-10-CM | POA: Diagnosis not present

## 2023-06-30 DIAGNOSIS — I5042 Chronic combined systolic (congestive) and diastolic (congestive) heart failure: Secondary | ICD-10-CM | POA: Diagnosis not present

## 2023-06-30 DIAGNOSIS — I890 Lymphedema, not elsewhere classified: Secondary | ICD-10-CM | POA: Diagnosis not present

## 2023-06-30 DIAGNOSIS — L03119 Cellulitis of unspecified part of limb: Secondary | ICD-10-CM | POA: Diagnosis not present

## 2023-06-30 DIAGNOSIS — R7303 Prediabetes: Secondary | ICD-10-CM | POA: Diagnosis not present

## 2023-06-30 DIAGNOSIS — I10 Essential (primary) hypertension: Secondary | ICD-10-CM | POA: Diagnosis not present

## 2023-06-30 DIAGNOSIS — L0390 Cellulitis, unspecified: Secondary | ICD-10-CM | POA: Diagnosis not present

## 2023-07-03 DIAGNOSIS — L03119 Cellulitis of unspecified part of limb: Secondary | ICD-10-CM | POA: Diagnosis not present

## 2023-07-03 DIAGNOSIS — L0390 Cellulitis, unspecified: Secondary | ICD-10-CM | POA: Diagnosis not present

## 2023-07-03 DIAGNOSIS — E876 Hypokalemia: Secondary | ICD-10-CM | POA: Diagnosis not present

## 2023-07-03 DIAGNOSIS — I890 Lymphedema, not elsewhere classified: Secondary | ICD-10-CM | POA: Diagnosis not present

## 2023-07-03 DIAGNOSIS — I10 Essential (primary) hypertension: Secondary | ICD-10-CM | POA: Diagnosis not present

## 2023-07-03 DIAGNOSIS — R7303 Prediabetes: Secondary | ICD-10-CM | POA: Diagnosis not present

## 2023-07-03 DIAGNOSIS — G4733 Obstructive sleep apnea (adult) (pediatric): Secondary | ICD-10-CM | POA: Diagnosis not present

## 2023-07-03 DIAGNOSIS — I5042 Chronic combined systolic (congestive) and diastolic (congestive) heart failure: Secondary | ICD-10-CM | POA: Diagnosis not present

## 2023-07-04 ENCOUNTER — Inpatient Hospital Stay: Admitting: Adult Health

## 2023-07-05 DIAGNOSIS — I5042 Chronic combined systolic (congestive) and diastolic (congestive) heart failure: Secondary | ICD-10-CM | POA: Diagnosis not present

## 2023-07-05 DIAGNOSIS — G4733 Obstructive sleep apnea (adult) (pediatric): Secondary | ICD-10-CM | POA: Diagnosis not present

## 2023-07-05 DIAGNOSIS — L03115 Cellulitis of right lower limb: Secondary | ICD-10-CM | POA: Diagnosis not present

## 2023-07-05 DIAGNOSIS — E876 Hypokalemia: Secondary | ICD-10-CM | POA: Diagnosis not present

## 2023-07-05 DIAGNOSIS — R7303 Prediabetes: Secondary | ICD-10-CM | POA: Diagnosis not present

## 2023-07-05 DIAGNOSIS — L0390 Cellulitis, unspecified: Secondary | ICD-10-CM | POA: Diagnosis not present

## 2023-07-05 DIAGNOSIS — I10 Essential (primary) hypertension: Secondary | ICD-10-CM | POA: Diagnosis not present

## 2023-07-05 DIAGNOSIS — I5023 Acute on chronic systolic (congestive) heart failure: Secondary | ICD-10-CM | POA: Diagnosis not present

## 2023-07-05 DIAGNOSIS — I890 Lymphedema, not elsewhere classified: Secondary | ICD-10-CM | POA: Diagnosis not present

## 2023-07-05 DIAGNOSIS — L03119 Cellulitis of unspecified part of limb: Secondary | ICD-10-CM | POA: Diagnosis not present

## 2023-07-05 DIAGNOSIS — L03116 Cellulitis of left lower limb: Secondary | ICD-10-CM | POA: Diagnosis not present

## 2023-07-07 DIAGNOSIS — G4733 Obstructive sleep apnea (adult) (pediatric): Secondary | ICD-10-CM | POA: Diagnosis not present

## 2023-07-07 DIAGNOSIS — R7303 Prediabetes: Secondary | ICD-10-CM | POA: Diagnosis not present

## 2023-07-07 DIAGNOSIS — I5042 Chronic combined systolic (congestive) and diastolic (congestive) heart failure: Secondary | ICD-10-CM | POA: Diagnosis not present

## 2023-07-07 DIAGNOSIS — I10 Essential (primary) hypertension: Secondary | ICD-10-CM | POA: Diagnosis not present

## 2023-07-07 DIAGNOSIS — I890 Lymphedema, not elsewhere classified: Secondary | ICD-10-CM | POA: Diagnosis not present

## 2023-07-07 DIAGNOSIS — L0390 Cellulitis, unspecified: Secondary | ICD-10-CM | POA: Diagnosis not present

## 2023-07-07 DIAGNOSIS — L03119 Cellulitis of unspecified part of limb: Secondary | ICD-10-CM | POA: Diagnosis not present

## 2023-07-07 DIAGNOSIS — E876 Hypokalemia: Secondary | ICD-10-CM | POA: Diagnosis not present

## 2023-07-09 DIAGNOSIS — I890 Lymphedema, not elsewhere classified: Secondary | ICD-10-CM | POA: Diagnosis not present

## 2023-07-09 DIAGNOSIS — E876 Hypokalemia: Secondary | ICD-10-CM | POA: Diagnosis not present

## 2023-07-09 DIAGNOSIS — I10 Essential (primary) hypertension: Secondary | ICD-10-CM | POA: Diagnosis not present

## 2023-07-09 DIAGNOSIS — G4733 Obstructive sleep apnea (adult) (pediatric): Secondary | ICD-10-CM | POA: Diagnosis not present

## 2023-07-09 DIAGNOSIS — R7303 Prediabetes: Secondary | ICD-10-CM | POA: Diagnosis not present

## 2023-07-09 DIAGNOSIS — I5042 Chronic combined systolic (congestive) and diastolic (congestive) heart failure: Secondary | ICD-10-CM | POA: Diagnosis not present

## 2023-07-09 DIAGNOSIS — L0390 Cellulitis, unspecified: Secondary | ICD-10-CM | POA: Diagnosis not present

## 2023-07-09 DIAGNOSIS — L03119 Cellulitis of unspecified part of limb: Secondary | ICD-10-CM | POA: Diagnosis not present

## 2023-07-10 DIAGNOSIS — I10 Essential (primary) hypertension: Secondary | ICD-10-CM | POA: Diagnosis not present

## 2023-07-10 DIAGNOSIS — R7303 Prediabetes: Secondary | ICD-10-CM | POA: Diagnosis not present

## 2023-07-10 DIAGNOSIS — L0390 Cellulitis, unspecified: Secondary | ICD-10-CM | POA: Diagnosis not present

## 2023-07-10 DIAGNOSIS — I890 Lymphedema, not elsewhere classified: Secondary | ICD-10-CM | POA: Diagnosis not present

## 2023-07-10 DIAGNOSIS — I5042 Chronic combined systolic (congestive) and diastolic (congestive) heart failure: Secondary | ICD-10-CM | POA: Diagnosis not present

## 2023-07-10 DIAGNOSIS — L03119 Cellulitis of unspecified part of limb: Secondary | ICD-10-CM | POA: Diagnosis not present

## 2023-07-10 DIAGNOSIS — E876 Hypokalemia: Secondary | ICD-10-CM | POA: Diagnosis not present

## 2023-07-10 DIAGNOSIS — G4733 Obstructive sleep apnea (adult) (pediatric): Secondary | ICD-10-CM | POA: Diagnosis not present

## 2023-07-12 DIAGNOSIS — I10 Essential (primary) hypertension: Secondary | ICD-10-CM | POA: Diagnosis not present

## 2023-07-12 DIAGNOSIS — G4733 Obstructive sleep apnea (adult) (pediatric): Secondary | ICD-10-CM | POA: Diagnosis not present

## 2023-07-12 DIAGNOSIS — R7303 Prediabetes: Secondary | ICD-10-CM | POA: Diagnosis not present

## 2023-07-12 DIAGNOSIS — L0390 Cellulitis, unspecified: Secondary | ICD-10-CM | POA: Diagnosis not present

## 2023-07-12 DIAGNOSIS — I890 Lymphedema, not elsewhere classified: Secondary | ICD-10-CM | POA: Diagnosis not present

## 2023-07-12 DIAGNOSIS — E876 Hypokalemia: Secondary | ICD-10-CM | POA: Diagnosis not present

## 2023-07-12 DIAGNOSIS — L03119 Cellulitis of unspecified part of limb: Secondary | ICD-10-CM | POA: Diagnosis not present

## 2023-07-12 DIAGNOSIS — I5042 Chronic combined systolic (congestive) and diastolic (congestive) heart failure: Secondary | ICD-10-CM | POA: Diagnosis not present

## 2023-07-13 DIAGNOSIS — R7303 Prediabetes: Secondary | ICD-10-CM | POA: Diagnosis not present

## 2023-07-13 DIAGNOSIS — G4733 Obstructive sleep apnea (adult) (pediatric): Secondary | ICD-10-CM | POA: Diagnosis not present

## 2023-07-13 DIAGNOSIS — I890 Lymphedema, not elsewhere classified: Secondary | ICD-10-CM | POA: Diagnosis not present

## 2023-07-13 DIAGNOSIS — I5042 Chronic combined systolic (congestive) and diastolic (congestive) heart failure: Secondary | ICD-10-CM | POA: Diagnosis not present

## 2023-07-13 DIAGNOSIS — L03119 Cellulitis of unspecified part of limb: Secondary | ICD-10-CM | POA: Diagnosis not present

## 2023-07-13 DIAGNOSIS — L0390 Cellulitis, unspecified: Secondary | ICD-10-CM | POA: Diagnosis not present

## 2023-07-13 DIAGNOSIS — I10 Essential (primary) hypertension: Secondary | ICD-10-CM | POA: Diagnosis not present

## 2023-07-13 DIAGNOSIS — E876 Hypokalemia: Secondary | ICD-10-CM | POA: Diagnosis not present

## 2023-07-16 DIAGNOSIS — I5022 Chronic systolic (congestive) heart failure: Secondary | ICD-10-CM | POA: Diagnosis not present

## 2023-07-16 DIAGNOSIS — C859A Non-Hodgkin lymphoma, unspecified, in remission: Secondary | ICD-10-CM | POA: Diagnosis not present

## 2023-07-16 DIAGNOSIS — L03116 Cellulitis of left lower limb: Secondary | ICD-10-CM | POA: Diagnosis not present

## 2023-07-16 DIAGNOSIS — M6281 Muscle weakness (generalized): Secondary | ICD-10-CM | POA: Diagnosis not present

## 2023-07-16 DIAGNOSIS — I890 Lymphedema, not elsewhere classified: Secondary | ICD-10-CM | POA: Diagnosis not present

## 2023-07-16 DIAGNOSIS — G4733 Obstructive sleep apnea (adult) (pediatric): Secondary | ICD-10-CM | POA: Diagnosis not present

## 2023-07-16 DIAGNOSIS — E876 Hypokalemia: Secondary | ICD-10-CM | POA: Diagnosis not present

## 2023-07-16 DIAGNOSIS — I110 Hypertensive heart disease with heart failure: Secondary | ICD-10-CM | POA: Diagnosis not present

## 2023-07-16 DIAGNOSIS — I5033 Acute on chronic diastolic (congestive) heart failure: Secondary | ICD-10-CM | POA: Diagnosis not present

## 2023-07-19 DIAGNOSIS — L03116 Cellulitis of left lower limb: Secondary | ICD-10-CM | POA: Diagnosis not present

## 2023-07-19 DIAGNOSIS — G4733 Obstructive sleep apnea (adult) (pediatric): Secondary | ICD-10-CM | POA: Diagnosis not present

## 2023-07-19 DIAGNOSIS — C859A Non-Hodgkin lymphoma, unspecified, in remission: Secondary | ICD-10-CM | POA: Diagnosis not present

## 2023-07-19 DIAGNOSIS — I5033 Acute on chronic diastolic (congestive) heart failure: Secondary | ICD-10-CM | POA: Diagnosis not present

## 2023-07-19 DIAGNOSIS — I110 Hypertensive heart disease with heart failure: Secondary | ICD-10-CM | POA: Diagnosis not present

## 2023-07-19 DIAGNOSIS — M6281 Muscle weakness (generalized): Secondary | ICD-10-CM | POA: Diagnosis not present

## 2023-07-19 DIAGNOSIS — E876 Hypokalemia: Secondary | ICD-10-CM | POA: Diagnosis not present

## 2023-07-19 DIAGNOSIS — I5022 Chronic systolic (congestive) heart failure: Secondary | ICD-10-CM | POA: Diagnosis not present

## 2023-07-19 DIAGNOSIS — I890 Lymphedema, not elsewhere classified: Secondary | ICD-10-CM | POA: Diagnosis not present

## 2023-07-21 DIAGNOSIS — I5033 Acute on chronic diastolic (congestive) heart failure: Secondary | ICD-10-CM | POA: Diagnosis not present

## 2023-07-21 DIAGNOSIS — C859A Non-Hodgkin lymphoma, unspecified, in remission: Secondary | ICD-10-CM | POA: Diagnosis not present

## 2023-07-21 DIAGNOSIS — L03116 Cellulitis of left lower limb: Secondary | ICD-10-CM | POA: Diagnosis not present

## 2023-07-21 DIAGNOSIS — M6281 Muscle weakness (generalized): Secondary | ICD-10-CM | POA: Diagnosis not present

## 2023-07-21 DIAGNOSIS — E876 Hypokalemia: Secondary | ICD-10-CM | POA: Diagnosis not present

## 2023-07-21 DIAGNOSIS — I5022 Chronic systolic (congestive) heart failure: Secondary | ICD-10-CM | POA: Diagnosis not present

## 2023-07-21 DIAGNOSIS — I110 Hypertensive heart disease with heart failure: Secondary | ICD-10-CM | POA: Diagnosis not present

## 2023-07-21 DIAGNOSIS — G4733 Obstructive sleep apnea (adult) (pediatric): Secondary | ICD-10-CM | POA: Diagnosis not present

## 2023-07-21 DIAGNOSIS — I890 Lymphedema, not elsewhere classified: Secondary | ICD-10-CM | POA: Diagnosis not present

## 2023-07-22 DIAGNOSIS — I5022 Chronic systolic (congestive) heart failure: Secondary | ICD-10-CM | POA: Diagnosis not present

## 2023-07-22 DIAGNOSIS — C859A Non-Hodgkin lymphoma, unspecified, in remission: Secondary | ICD-10-CM | POA: Diagnosis not present

## 2023-07-22 DIAGNOSIS — M6281 Muscle weakness (generalized): Secondary | ICD-10-CM | POA: Diagnosis not present

## 2023-07-22 DIAGNOSIS — G4733 Obstructive sleep apnea (adult) (pediatric): Secondary | ICD-10-CM | POA: Diagnosis not present

## 2023-07-22 DIAGNOSIS — I110 Hypertensive heart disease with heart failure: Secondary | ICD-10-CM | POA: Diagnosis not present

## 2023-07-22 DIAGNOSIS — L03116 Cellulitis of left lower limb: Secondary | ICD-10-CM | POA: Diagnosis not present

## 2023-07-22 DIAGNOSIS — E876 Hypokalemia: Secondary | ICD-10-CM | POA: Diagnosis not present

## 2023-07-22 DIAGNOSIS — I890 Lymphedema, not elsewhere classified: Secondary | ICD-10-CM | POA: Diagnosis not present

## 2023-07-22 DIAGNOSIS — I5033 Acute on chronic diastolic (congestive) heart failure: Secondary | ICD-10-CM | POA: Diagnosis not present

## 2023-07-25 ENCOUNTER — Inpatient Hospital Stay: Admitting: Adult Health

## 2023-07-25 DIAGNOSIS — I5033 Acute on chronic diastolic (congestive) heart failure: Secondary | ICD-10-CM | POA: Diagnosis not present

## 2023-07-25 DIAGNOSIS — I890 Lymphedema, not elsewhere classified: Secondary | ICD-10-CM | POA: Diagnosis not present

## 2023-07-25 DIAGNOSIS — L03116 Cellulitis of left lower limb: Secondary | ICD-10-CM | POA: Diagnosis not present

## 2023-07-25 DIAGNOSIS — C859A Non-Hodgkin lymphoma, unspecified, in remission: Secondary | ICD-10-CM | POA: Diagnosis not present

## 2023-07-25 DIAGNOSIS — G4733 Obstructive sleep apnea (adult) (pediatric): Secondary | ICD-10-CM | POA: Diagnosis not present

## 2023-07-25 DIAGNOSIS — M6281 Muscle weakness (generalized): Secondary | ICD-10-CM | POA: Diagnosis not present

## 2023-07-25 DIAGNOSIS — I110 Hypertensive heart disease with heart failure: Secondary | ICD-10-CM | POA: Diagnosis not present

## 2023-07-25 DIAGNOSIS — E876 Hypokalemia: Secondary | ICD-10-CM | POA: Diagnosis not present

## 2023-07-25 DIAGNOSIS — I5022 Chronic systolic (congestive) heart failure: Secondary | ICD-10-CM | POA: Diagnosis not present

## 2023-07-28 DIAGNOSIS — L03116 Cellulitis of left lower limb: Secondary | ICD-10-CM | POA: Diagnosis not present

## 2023-07-28 DIAGNOSIS — I5033 Acute on chronic diastolic (congestive) heart failure: Secondary | ICD-10-CM | POA: Diagnosis not present

## 2023-07-28 DIAGNOSIS — I5022 Chronic systolic (congestive) heart failure: Secondary | ICD-10-CM | POA: Diagnosis not present

## 2023-07-28 DIAGNOSIS — I890 Lymphedema, not elsewhere classified: Secondary | ICD-10-CM | POA: Diagnosis not present

## 2023-07-28 DIAGNOSIS — G4733 Obstructive sleep apnea (adult) (pediatric): Secondary | ICD-10-CM | POA: Diagnosis not present

## 2023-07-28 DIAGNOSIS — C859A Non-Hodgkin lymphoma, unspecified, in remission: Secondary | ICD-10-CM | POA: Diagnosis not present

## 2023-07-28 DIAGNOSIS — E876 Hypokalemia: Secondary | ICD-10-CM | POA: Diagnosis not present

## 2023-07-28 DIAGNOSIS — I110 Hypertensive heart disease with heart failure: Secondary | ICD-10-CM | POA: Diagnosis not present

## 2023-07-28 DIAGNOSIS — M6281 Muscle weakness (generalized): Secondary | ICD-10-CM | POA: Diagnosis not present

## 2023-07-31 DIAGNOSIS — L03116 Cellulitis of left lower limb: Secondary | ICD-10-CM | POA: Diagnosis not present

## 2023-07-31 DIAGNOSIS — I5033 Acute on chronic diastolic (congestive) heart failure: Secondary | ICD-10-CM | POA: Diagnosis not present

## 2023-07-31 DIAGNOSIS — C859A Non-Hodgkin lymphoma, unspecified, in remission: Secondary | ICD-10-CM | POA: Diagnosis not present

## 2023-07-31 DIAGNOSIS — I890 Lymphedema, not elsewhere classified: Secondary | ICD-10-CM | POA: Diagnosis not present

## 2023-07-31 DIAGNOSIS — M6281 Muscle weakness (generalized): Secondary | ICD-10-CM | POA: Diagnosis not present

## 2023-07-31 DIAGNOSIS — I110 Hypertensive heart disease with heart failure: Secondary | ICD-10-CM | POA: Diagnosis not present

## 2023-07-31 DIAGNOSIS — I5022 Chronic systolic (congestive) heart failure: Secondary | ICD-10-CM | POA: Diagnosis not present

## 2023-07-31 DIAGNOSIS — G4733 Obstructive sleep apnea (adult) (pediatric): Secondary | ICD-10-CM | POA: Diagnosis not present

## 2023-07-31 DIAGNOSIS — E876 Hypokalemia: Secondary | ICD-10-CM | POA: Diagnosis not present

## 2023-08-03 DIAGNOSIS — I890 Lymphedema, not elsewhere classified: Secondary | ICD-10-CM | POA: Diagnosis not present

## 2023-08-03 DIAGNOSIS — I5022 Chronic systolic (congestive) heart failure: Secondary | ICD-10-CM | POA: Diagnosis not present

## 2023-08-03 DIAGNOSIS — G4733 Obstructive sleep apnea (adult) (pediatric): Secondary | ICD-10-CM | POA: Diagnosis not present

## 2023-08-03 DIAGNOSIS — E876 Hypokalemia: Secondary | ICD-10-CM | POA: Diagnosis not present

## 2023-08-03 DIAGNOSIS — I5033 Acute on chronic diastolic (congestive) heart failure: Secondary | ICD-10-CM | POA: Diagnosis not present

## 2023-08-03 DIAGNOSIS — I110 Hypertensive heart disease with heart failure: Secondary | ICD-10-CM | POA: Diagnosis not present

## 2023-08-03 DIAGNOSIS — M6281 Muscle weakness (generalized): Secondary | ICD-10-CM | POA: Diagnosis not present

## 2023-08-03 DIAGNOSIS — L03116 Cellulitis of left lower limb: Secondary | ICD-10-CM | POA: Diagnosis not present

## 2023-08-03 DIAGNOSIS — C859A Non-Hodgkin lymphoma, unspecified, in remission: Secondary | ICD-10-CM | POA: Diagnosis not present

## 2023-08-04 ENCOUNTER — Inpatient Hospital Stay: Admitting: Adult Health

## 2023-08-08 DIAGNOSIS — I5033 Acute on chronic diastolic (congestive) heart failure: Secondary | ICD-10-CM | POA: Diagnosis not present

## 2023-08-08 DIAGNOSIS — L03116 Cellulitis of left lower limb: Secondary | ICD-10-CM | POA: Diagnosis not present

## 2023-08-08 DIAGNOSIS — C859A Non-Hodgkin lymphoma, unspecified, in remission: Secondary | ICD-10-CM | POA: Diagnosis not present

## 2023-08-08 DIAGNOSIS — I890 Lymphedema, not elsewhere classified: Secondary | ICD-10-CM | POA: Diagnosis not present

## 2023-08-08 DIAGNOSIS — M6281 Muscle weakness (generalized): Secondary | ICD-10-CM | POA: Diagnosis not present

## 2023-08-08 DIAGNOSIS — G4733 Obstructive sleep apnea (adult) (pediatric): Secondary | ICD-10-CM | POA: Diagnosis not present

## 2023-08-08 DIAGNOSIS — I110 Hypertensive heart disease with heart failure: Secondary | ICD-10-CM | POA: Diagnosis not present

## 2023-08-08 DIAGNOSIS — E876 Hypokalemia: Secondary | ICD-10-CM | POA: Diagnosis not present

## 2023-08-08 DIAGNOSIS — I5022 Chronic systolic (congestive) heart failure: Secondary | ICD-10-CM | POA: Diagnosis not present

## 2023-08-11 ENCOUNTER — Inpatient Hospital Stay: Admitting: Adult Health

## 2023-08-15 DIAGNOSIS — I110 Hypertensive heart disease with heart failure: Secondary | ICD-10-CM | POA: Diagnosis not present

## 2023-08-15 DIAGNOSIS — M6281 Muscle weakness (generalized): Secondary | ICD-10-CM | POA: Diagnosis not present

## 2023-08-15 DIAGNOSIS — C859A Non-Hodgkin lymphoma, unspecified, in remission: Secondary | ICD-10-CM | POA: Diagnosis not present

## 2023-08-15 DIAGNOSIS — E876 Hypokalemia: Secondary | ICD-10-CM | POA: Diagnosis not present

## 2023-08-15 DIAGNOSIS — G4733 Obstructive sleep apnea (adult) (pediatric): Secondary | ICD-10-CM | POA: Diagnosis not present

## 2023-08-15 DIAGNOSIS — L03116 Cellulitis of left lower limb: Secondary | ICD-10-CM | POA: Diagnosis not present

## 2023-08-15 DIAGNOSIS — I5033 Acute on chronic diastolic (congestive) heart failure: Secondary | ICD-10-CM | POA: Diagnosis not present

## 2023-08-15 DIAGNOSIS — I5022 Chronic systolic (congestive) heart failure: Secondary | ICD-10-CM | POA: Diagnosis not present

## 2023-08-15 DIAGNOSIS — I890 Lymphedema, not elsewhere classified: Secondary | ICD-10-CM | POA: Diagnosis not present

## 2023-08-18 ENCOUNTER — Inpatient Hospital Stay: Admitting: Adult Health

## 2023-08-18 DIAGNOSIS — I890 Lymphedema, not elsewhere classified: Secondary | ICD-10-CM | POA: Diagnosis not present

## 2023-08-18 DIAGNOSIS — M6281 Muscle weakness (generalized): Secondary | ICD-10-CM | POA: Diagnosis not present

## 2023-08-18 DIAGNOSIS — E876 Hypokalemia: Secondary | ICD-10-CM | POA: Diagnosis not present

## 2023-08-18 DIAGNOSIS — I5022 Chronic systolic (congestive) heart failure: Secondary | ICD-10-CM | POA: Diagnosis not present

## 2023-08-18 DIAGNOSIS — I5033 Acute on chronic diastolic (congestive) heart failure: Secondary | ICD-10-CM | POA: Diagnosis not present

## 2023-08-18 DIAGNOSIS — L03116 Cellulitis of left lower limb: Secondary | ICD-10-CM | POA: Diagnosis not present

## 2023-08-18 DIAGNOSIS — G4733 Obstructive sleep apnea (adult) (pediatric): Secondary | ICD-10-CM | POA: Diagnosis not present

## 2023-08-18 DIAGNOSIS — C859A Non-Hodgkin lymphoma, unspecified, in remission: Secondary | ICD-10-CM | POA: Diagnosis not present

## 2023-08-18 DIAGNOSIS — I110 Hypertensive heart disease with heart failure: Secondary | ICD-10-CM | POA: Diagnosis not present

## 2023-08-22 DIAGNOSIS — G4733 Obstructive sleep apnea (adult) (pediatric): Secondary | ICD-10-CM | POA: Diagnosis not present

## 2023-08-22 DIAGNOSIS — I110 Hypertensive heart disease with heart failure: Secondary | ICD-10-CM | POA: Diagnosis not present

## 2023-08-22 DIAGNOSIS — M6281 Muscle weakness (generalized): Secondary | ICD-10-CM | POA: Diagnosis not present

## 2023-08-22 DIAGNOSIS — I5033 Acute on chronic diastolic (congestive) heart failure: Secondary | ICD-10-CM | POA: Diagnosis not present

## 2023-08-22 DIAGNOSIS — I890 Lymphedema, not elsewhere classified: Secondary | ICD-10-CM | POA: Diagnosis not present

## 2023-08-22 DIAGNOSIS — E876 Hypokalemia: Secondary | ICD-10-CM | POA: Diagnosis not present

## 2023-08-22 DIAGNOSIS — I5022 Chronic systolic (congestive) heart failure: Secondary | ICD-10-CM | POA: Diagnosis not present

## 2023-08-22 DIAGNOSIS — L03116 Cellulitis of left lower limb: Secondary | ICD-10-CM | POA: Diagnosis not present

## 2023-08-22 DIAGNOSIS — C859A Non-Hodgkin lymphoma, unspecified, in remission: Secondary | ICD-10-CM | POA: Diagnosis not present

## 2023-08-29 DIAGNOSIS — M6281 Muscle weakness (generalized): Secondary | ICD-10-CM | POA: Diagnosis not present

## 2023-08-29 DIAGNOSIS — L03116 Cellulitis of left lower limb: Secondary | ICD-10-CM | POA: Diagnosis not present

## 2023-08-29 DIAGNOSIS — G4733 Obstructive sleep apnea (adult) (pediatric): Secondary | ICD-10-CM | POA: Diagnosis not present

## 2023-08-29 DIAGNOSIS — E876 Hypokalemia: Secondary | ICD-10-CM | POA: Diagnosis not present

## 2023-08-29 DIAGNOSIS — I5022 Chronic systolic (congestive) heart failure: Secondary | ICD-10-CM | POA: Diagnosis not present

## 2023-08-29 DIAGNOSIS — C859A Non-Hodgkin lymphoma, unspecified, in remission: Secondary | ICD-10-CM | POA: Diagnosis not present

## 2023-08-29 DIAGNOSIS — I110 Hypertensive heart disease with heart failure: Secondary | ICD-10-CM | POA: Diagnosis not present

## 2023-08-29 DIAGNOSIS — I5033 Acute on chronic diastolic (congestive) heart failure: Secondary | ICD-10-CM | POA: Diagnosis not present

## 2023-08-29 DIAGNOSIS — I890 Lymphedema, not elsewhere classified: Secondary | ICD-10-CM | POA: Diagnosis not present

## 2023-09-01 ENCOUNTER — Ambulatory Visit: Admitting: Adult Health

## 2023-09-11 DIAGNOSIS — G4733 Obstructive sleep apnea (adult) (pediatric): Secondary | ICD-10-CM | POA: Diagnosis not present

## 2023-09-11 DIAGNOSIS — I5022 Chronic systolic (congestive) heart failure: Secondary | ICD-10-CM | POA: Diagnosis not present

## 2023-09-11 DIAGNOSIS — I5033 Acute on chronic diastolic (congestive) heart failure: Secondary | ICD-10-CM | POA: Diagnosis not present

## 2023-09-11 DIAGNOSIS — L03116 Cellulitis of left lower limb: Secondary | ICD-10-CM | POA: Diagnosis not present

## 2023-09-11 DIAGNOSIS — E876 Hypokalemia: Secondary | ICD-10-CM | POA: Diagnosis not present

## 2023-09-11 DIAGNOSIS — I110 Hypertensive heart disease with heart failure: Secondary | ICD-10-CM | POA: Diagnosis not present

## 2023-09-11 DIAGNOSIS — I890 Lymphedema, not elsewhere classified: Secondary | ICD-10-CM | POA: Diagnosis not present

## 2023-09-11 DIAGNOSIS — C859A Non-Hodgkin lymphoma, unspecified, in remission: Secondary | ICD-10-CM | POA: Diagnosis not present

## 2023-09-11 DIAGNOSIS — M6281 Muscle weakness (generalized): Secondary | ICD-10-CM | POA: Diagnosis not present

## 2023-09-19 ENCOUNTER — Ambulatory Visit (INDEPENDENT_AMBULATORY_CARE_PROVIDER_SITE_OTHER): Admitting: Adult Health

## 2023-09-19 VITALS — BP 140/80 | HR 77 | Temp 97.4°F | Ht 68.0 in | Wt 292.0 lb

## 2023-09-19 DIAGNOSIS — E2839 Other primary ovarian failure: Secondary | ICD-10-CM | POA: Diagnosis not present

## 2023-09-19 DIAGNOSIS — Z0000 Encounter for general adult medical examination without abnormal findings: Secondary | ICD-10-CM | POA: Diagnosis not present

## 2023-09-19 DIAGNOSIS — G4733 Obstructive sleep apnea (adult) (pediatric): Secondary | ICD-10-CM | POA: Diagnosis not present

## 2023-09-19 DIAGNOSIS — M150 Primary generalized (osteo)arthritis: Secondary | ICD-10-CM | POA: Diagnosis not present

## 2023-09-19 DIAGNOSIS — I10 Essential (primary) hypertension: Secondary | ICD-10-CM

## 2023-09-19 DIAGNOSIS — R7303 Prediabetes: Secondary | ICD-10-CM | POA: Diagnosis not present

## 2023-09-19 DIAGNOSIS — E66813 Obesity, class 3: Secondary | ICD-10-CM

## 2023-09-19 DIAGNOSIS — I890 Lymphedema, not elsewhere classified: Secondary | ICD-10-CM | POA: Diagnosis not present

## 2023-09-19 DIAGNOSIS — Z6841 Body Mass Index (BMI) 40.0 and over, adult: Secondary | ICD-10-CM

## 2023-09-19 LAB — CBC WITH DIFFERENTIAL/PLATELET
Basophils Absolute: 0 K/uL (ref 0.0–0.1)
Basophils Relative: 0.5 % (ref 0.0–3.0)
Eosinophils Absolute: 0.1 K/uL (ref 0.0–0.7)
Eosinophils Relative: 1.6 % (ref 0.0–5.0)
HCT: 39.1 % (ref 36.0–46.0)
Hemoglobin: 13 g/dL (ref 12.0–15.0)
Lymphocytes Relative: 19.2 % (ref 12.0–46.0)
Lymphs Abs: 1.7 K/uL (ref 0.7–4.0)
MCHC: 33.3 g/dL (ref 30.0–36.0)
MCV: 98.7 fl (ref 78.0–100.0)
Monocytes Absolute: 0.6 K/uL (ref 0.1–1.0)
Monocytes Relative: 6.5 % (ref 3.0–12.0)
Neutro Abs: 6.4 K/uL (ref 1.4–7.7)
Neutrophils Relative %: 72.2 % (ref 43.0–77.0)
Platelets: 297 K/uL (ref 150.0–400.0)
RBC: 3.96 Mil/uL (ref 3.87–5.11)
RDW: 15.6 % — ABNORMAL HIGH (ref 11.5–15.5)
WBC: 8.8 K/uL (ref 4.0–10.5)

## 2023-09-19 LAB — LIPID PANEL
Cholesterol: 158 mg/dL (ref 0–200)
HDL: 46.3 mg/dL (ref >39.00)
LDL Cholesterol: 95 mg/dL (ref 0–99)
NonHDL: 111.53
Total CHOL/HDL Ratio: 3
Triglycerides: 84 mg/dL (ref 0.0–149.0)
VLDL: 16.8 mg/dL (ref 0.0–40.0)

## 2023-09-19 LAB — COMPREHENSIVE METABOLIC PANEL WITH GFR
ALT: 18 U/L (ref 0–35)
AST: 17 U/L (ref 0–37)
Albumin: 4.4 g/dL (ref 3.5–5.2)
Alkaline Phosphatase: 98 U/L (ref 39–117)
BUN: 24 mg/dL — ABNORMAL HIGH (ref 6–23)
CO2: 24 meq/L (ref 19–32)
Calcium: 10.1 mg/dL (ref 8.4–10.5)
Chloride: 101 meq/L (ref 96–112)
Creatinine, Ser: 0.93 mg/dL (ref 0.40–1.20)
GFR: 60.27 mL/min (ref >60.00)
Glucose, Bld: 126 mg/dL — ABNORMAL HIGH (ref 70–99)
Potassium: 4 meq/L (ref 3.5–5.1)
Sodium: 138 meq/L (ref 135–145)
Total Bilirubin: 0.7 mg/dL (ref 0.2–1.2)
Total Protein: 8.2 g/dL (ref 6.0–8.3)

## 2023-09-19 LAB — TSH: TSH: 1.1 u[IU]/mL (ref 0.35–5.50)

## 2023-09-19 LAB — HEMOGLOBIN A1C: Hgb A1c MFr Bld: 6 % (ref 4.6–6.5)

## 2023-09-19 MED ORDER — TRAMADOL HCL 50 MG PO TABS: 50.0000 mg | ORAL_TABLET | Freq: Three times a day (TID) | ORAL | 0 refills | Status: DC | PRN

## 2023-09-19 MED ORDER — TORSEMIDE 20 MG PO TABS: 20.0000 mg | ORAL_TABLET | Freq: Two times a day (BID) | ORAL | 1 refills | Status: AC

## 2023-09-19 NOTE — Progress Notes (Signed)
 Subjective:    Patient ID: Allison Short, female    DOB: September 01, 1950, 73 y.o.   MRN: [id number]  HPI Patient presents for yearly preventative medicine examination. She is a pleasant 73 year old female who  has a past medical history of Claustrophobia, History of blood transfusion, History of urinary tract infection, Hypertension, Non Hodgkin's lymphoma (HCC) (2008), Obesity, Osteopenia (04/2017), and Pancreatitis.  Prediabetes -last A1c was 6.4- 3 months ago.  We placed her on metformin  500 mg twice daily.  She does report that she has been tolerating this medication well  Lab Results  Component Value Date   HGBA1C 5.9 (H) 06/19/2023   HGBA1C 5.8 (A) 09/21/2022   HGBA1C 6.4 06/14/2022   HTN - currently managed with Metoprolol  25mg  BID and torsemide  20 mg twice daily. She has been checking her blood pressure at home with readings in the 120-130/70's. BP Readings from Last 3 Encounters:  09/19/23 (!) 140/80  06/27/23 (!) 140/68  09/21/22 (!) 144/82   Class 3 Obesity -. She is actively working on calorie counting and following a heathy diet. She is doing exercises at home.   Wt Readings from Last 10 Encounters:  09/19/23 292 lb (132.5 kg)  06/27/23 (!) 340 lb 2.7 oz (154.3 kg)  05/29/23 (!) 317 lb (143.8 kg)  09/21/22 (!) 317 lb (143.8 kg)  09/16/22 (!) 319 lb 6.4 oz (144.9 kg)  05/10/22 (!) 343 lb (155.6 kg)  05/04/22 (!) 343 lb (155.6 kg)  04/13/22 (!) 369 lb 7.9 oz (167.6 kg)  08/20/20 (!) 373 lb 3.8 oz (169.3 kg)  06/24/19 (!) 346 lb (156.9 kg)   Lymphedema - She has home health PT coming into her house to help with lymphedema. She does wear compression socks and also does wraps at home when not wearing her compression socks.  She was seen in the hospital and had an admission at the end of April into early May in which she had worsening lower extremity edema and at this time her diuretic furosemide  was changed to torsemide  and she was able to lose roughly 22 years of fluid.  She has  been able to keep this fluid off and has been gradually losing weight.  OSA - wears CPAP nightly. She uses a nasal mask. She does not use nightly.   Osteoarthritis -she has pain in multiple joints that is especially apparent with weather changes, she has been taking up to 3 g of Tylenol  when it rains to help with her joint pain but this has been ineffective. She does not like to take NSAIDS as this is been known to cause a decreased GFR when she does take them  All immunizations and health maintenance protocols were reviewed with the patient and needed orders were placed.  Appropriate screening laboratory values were ordered for the patient including screening of hyperlipidemia, renal function and hepatic function.  Medication reconciliation,  past medical history, social history, problem list and allergies were reviewed in detail with the patient  Goals were established with regard to weight loss, exercise, and  diet in compliance with medications.  She reports that when her son passed away about a year ago this August she fell off the wagon but has been able to get back on his eating healthier and staying more active.  She is due for mammogram and bone density screen   Review of Systems  Constitutional: Negative.   HENT: Negative.    Eyes: Negative.   Respiratory: Negative.    Cardiovascular:  Positive for leg swelling.  Gastrointestinal: Negative.   Endocrine: Negative.   Genitourinary: Negative.   Musculoskeletal: Negative.   Skin: Negative.   Allergic/Immunologic: Negative.   Neurological: Negative.   Hematological: Negative.   Psychiatric/Behavioral: Negative.     Past Medical History:  Diagnosis Date   Claustrophobia    History of blood transfusion    2016   History of urinary tract infection    Hypertension    Non Hodgkin's lymphoma (HCC) 2008   Obesity    Osteopenia 04/2017   T score -1.4 FRAX 7.5% / 0.8%   Pancreatitis     Social History   Socioeconomic  History   Marital status: Divorced    Spouse name: Not on file   Number of children: Not on file   Years of education: Not on file   Highest education level: Some college, no degree  Occupational History   Not on file  Tobacco Use   Smoking status: Never   Smokeless tobacco: Never  Vaping Use   Vaping status: Never Used  Substance and Sexual Activity   Alcohol use: Yes    Alcohol/week: 0.0 standard drinks of alcohol    Comment: Occas   Drug use: No   Sexual activity: Not Currently    Comment: 1st intercourse 15 yo-5 partners  Other Topics Concern   Not on file  Social History Narrative   Not on file   Social Drivers of Health   Financial Resource Strain: High Risk (09/18/2023)   Overall Financial Resource Strain (CARDIA)    Difficulty of Paying Living Expenses: Hard  Food Insecurity: Food Insecurity Present (09/18/2023)   Hunger Vital Sign    Worried About Running Out of Food in the Last Year: Often true    Ran Out of Food in the Last Year: Sometimes true  Transportation Needs: Unmet Transportation Needs (09/18/2023)   PRAPARE - Transportation    Lack of Transportation (Medical): Yes    Lack of Transportation (Non-Medical): Yes  Physical Activity: Unknown (09/18/2023)   Exercise Vital Sign    Days of Exercise per Week: Patient declined    Minutes of Exercise per Session: Not on file  Stress: Patient Declined (09/18/2023)   [employer] of Occupational Health - Occupational Stress Questionnaire    Feeling of Stress: Patient declined  Social Connections: Socially Isolated (09/18/2023)   Social Connection and Isolation Panel    Frequency of Communication with Friends and Family: Three times a week    Frequency of Social Gatherings with Friends and Family: Patient declined    Attends Religious Services: Patient declined    Active Member of Clubs or Organizations: No    Attends Engineer, structural: Not on file    Marital Status: Divorced  Intimate Partner  Violence: Not At Risk (06/19/2023)   Humiliation, Afraid, Rape, and Kick questionnaire    Fear of Current or Ex-Partner: No    Emotionally Abused: No    Physically Abused: No    Sexually Abused: No    Past Surgical History:  Procedure Laterality Date   BIOPSY THYROID      BONE MARROW BIOPSY     CESAREAN SECTION  10/26/1975   CHOLECYSTECTOMY     2006   PORTA CATH INSERTION     TONSILLECTOMY AND ADENOIDECTOMY     1952   TOTAL KNEE ARTHROPLASTY Bilateral    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      Family History  Problem Relation Age of Onset   Arthritis Maternal Grandmother  Diabetes Son     Allergies  Allergen Reactions   Ciprofloxin Hcl [Ciprofloxacin] Other (See Comments)    Unstable gait   Morphine And Codeine Anaphylaxis and Other (See Comments)    Tolerates hydrocodone    Asa [Aspirin] Hives and Other (See Comments)    Hives can become SEVERE, requiring intervention   Chia Oil Swelling and Other (See Comments)    Possibly chia tea = Face and lips became swollen   Erythromycin Other (See Comments)    Reaction not recalled   Nsaids Other (See Comments)    Lowers GFR   Penicillins Other (See Comments)    Reaction not recalled- was told to never take this again by family    Current Outpatient Medications on File Prior to Visit  Medication Sig Dispense Refill   acetaminophen  (TYLENOL ) 500 MG tablet Take 1,000 mg by mouth every 8 (eight) hours as needed for mild pain, moderate pain or headache.     Cholecalciferol  (VITAMIN D3) 25 MCG (1000 UT) capsule Take 1,000 Units by mouth daily.     cyanocobalamin  (VITAMIN B12) 1000 MCG tablet Take 1 tablet (1,000 mcg total) by mouth daily.     hydrOXYzine  (ATARAX ) 25 MG tablet Take 1 tablet (25 mg total) by mouth at bedtime as needed for anxiety. 90 tablet 1   metFORMIN  (GLUCOPHAGE ) 500 MG tablet Take 1 tablet (500 mg total) by mouth 2 (two) times daily with a meal. 180 tablet 1   metoprolol  tartrate (LOPRESSOR ) 50 MG tablet Take 1 tablet (50  mg total) by mouth 2 (two) times daily. 180 tablet 1   potassium chloride  (KLOR-CON  M) 10 MEQ tablet Take 1 tablet (10 mEq total) by mouth daily. 90 tablet 1   No current facility-administered medications on file prior to visit.    BP (!) 140/80   Pulse 77   Temp (!) 97.4 F (36.3 C) (Oral)   Ht 5' 8 (1.727 m)   Wt 292 lb (132.5 kg)   SpO2 97%   BMI 44.40 kg/m       Objective:   Physical Exam Vitals and nursing note reviewed.  Constitutional:      Appearance: Normal appearance. She is obese.  HENT:     Right Ear: Tympanic membrane, ear canal and external ear normal. There is no impacted cerumen.     Left Ear: Tympanic membrane, ear canal and external ear normal. There is no impacted cerumen.     Mouth/Throat:     Mouth: Mucous membranes are moist.     Pharynx: Oropharynx is clear.  Cardiovascular:     Rate and Rhythm: Normal rate and regular rhythm.     Pulses: Normal pulses.     Heart sounds: Normal heart sounds.  Pulmonary:     Effort: Pulmonary effort is normal.     Breath sounds: Normal breath sounds.  Abdominal:     General: Abdomen is flat. Bowel sounds are normal.     Palpations: Abdomen is soft.  Musculoskeletal:        General: Normal range of motion.     Right lower leg: Edema (chronic) present.     Left lower leg: Edema (chronic) present.  Skin:    General: Skin is warm and dry.  Neurological:     General: No focal deficit present.     Mental Status: She is alert and oriented to person, place, and time.  Psychiatric:        Mood and Affect: Mood normal.  Behavior: Behavior normal.        Thought Content: Thought content normal.        Judgment: Judgment normal.        Assessment & Plan:  1. Routine general medical examination at a health care facility (Primary) Today patient counseled on age appropriate routine health concerns for screening and prevention, each reviewed and up to date or declined. Immunizations reviewed and up to date or  declined. Labs ordered and reviewed. Risk factors for depression reviewed and negative. Hearing function and visual acuity are intact. ADLs screened and addressed as needed. Functional ability and level of safety reviewed and appropriate. Education, counseling and referrals performed based on assessed risks today. Patient provided with a copy of personalized plan for preventive services.   2. Prediabetes - Continue with metformin   - Follow up in 6 months  - CBC with Differential/Platelet; Future - Comprehensive metabolic panel with GFR; Future - Lipid panel; Future - TSH; Future - Hemoglobin A1c; Future - Hemoglobin A1c - TSH - Lipid panel - Comprehensive metabolic panel with GFR - CBC with Differential/Platelet  3. Essential hypertension - Slightly elevated in the office but better at home.  - Continue with metoprolol  and torsemide   - torsemide  (DEMADEX ) 20 MG tablet; Take 1 tablet (20 mg total) by mouth 2 (two) times daily.  Dispense: 180 tablet; Refill: 1 - CBC with Differential/Platelet; Future - Comprehensive metabolic panel with GFR; Future - Lipid panel; Future - TSH; Future - TSH - Lipid panel - Comprehensive metabolic panel with GFR - CBC with Differential/Platelet  4. Class 3 severe obesity due to excess calories with serious comorbidity and body mass index (BMI) of 40.0 to 44.9 in adult - Continue with weight loss measures  - CBC with Differential/Platelet; Future - Comprehensive metabolic panel with GFR; Future - Lipid panel; Future - TSH; Future - Hemoglobin A1c; Future - Hemoglobin A1c - TSH - Lipid panel - Comprehensive metabolic panel with GFR - CBC with Differential/Platelet  5. Lymphedema of both lower extremities - Appears as baseline or slightly better than baseline - Continue with PT  - torsemide  (DEMADEX ) 20 MG tablet; Take 1 tablet (20 mg total) by mouth 2 (two) times daily.  Dispense: 180 tablet; Refill: 1 - CBC with Differential/Platelet;  Future - Comprehensive metabolic panel with GFR; Future - Lipid panel; Future - TSH; Future - TSH - Lipid panel - Comprehensive metabolic panel with GFR - CBC with Differential/Platelet  6. OSA (obstructive sleep apnea) - Wear CPAP nightly.  - CBC with Differential/Platelet; Future - Comprehensive metabolic panel with GFR; Future - Lipid panel; Future - TSH; Future - TSH - Lipid panel - Comprehensive metabolic panel with GFR - CBC with Differential/Platelet  7. Primary osteoarthritis involving multiple joints - Will give a short course of Tramadol  to have on hand for osteoarthritic pain  - traMADol  (ULTRAM ) 50 MG tablet; Take 1 tablet (50 mg total) by mouth every 8 (eight) hours as needed for up to 5 days.  Dispense: 15 tablet; Refill: 0  8. Estrogen deficiency  - DG Bone Density; Future  Darleene Shape, NP

## 2023-09-19 NOTE — Patient Instructions (Addendum)
 It was great seeing you today   We will follow up with you regarding your lab work   Please let me know if you need anything

## 2023-09-20 ENCOUNTER — Other Ambulatory Visit: Payer: Self-pay | Admitting: Adult Health

## 2023-09-20 ENCOUNTER — Ambulatory Visit: Payer: Self-pay | Admitting: Adult Health

## 2023-09-20 DIAGNOSIS — M150 Primary generalized (osteo)arthritis: Secondary | ICD-10-CM

## 2023-09-20 MED ORDER — TRAMADOL HCL 50 MG PO TABS: 50.0000 mg | ORAL_TABLET | Freq: Three times a day (TID) | ORAL | 1 refills | Status: DC | PRN

## 2023-09-20 NOTE — Telephone Encounter (Signed)
 Okay for refill?

## 2023-09-20 NOTE — Telephone Encounter (Signed)
 Copied from CRM [id number]. Topic: Clinical - Prescription Issue >> Sep 20, 2023 11:37 AM Drema MATSU wrote: Reason for CRM: Patient stated that she is having trouble with the prescription with the pharmacy. [facility] wont deliver (narcotics) traMADol  (ULTRAM ) 50 MG tablet to her door since she is home bound. Patient wants to know if it can be sent through [facility] [address] [place], [place] [address] Fax: [phone] unless pcp wants top try a different medication. [facility] stated that they dont normally take low quantity but they will.

## 2023-09-21 ENCOUNTER — Telehealth: Payer: Self-pay | Admitting: *Deleted

## 2023-09-21 DIAGNOSIS — I890 Lymphedema, not elsewhere classified: Secondary | ICD-10-CM | POA: Diagnosis not present

## 2023-09-21 DIAGNOSIS — C859A Non-Hodgkin lymphoma, unspecified, in remission: Secondary | ICD-10-CM | POA: Diagnosis not present

## 2023-09-21 DIAGNOSIS — R7303 Prediabetes: Secondary | ICD-10-CM | POA: Diagnosis not present

## 2023-09-21 DIAGNOSIS — I5042 Chronic combined systolic (congestive) and diastolic (congestive) heart failure: Secondary | ICD-10-CM | POA: Diagnosis not present

## 2023-09-21 DIAGNOSIS — E662 Morbid (severe) obesity with alveolar hypoventilation: Secondary | ICD-10-CM | POA: Diagnosis not present

## 2023-09-21 DIAGNOSIS — Z7984 Long term (current) use of oral hypoglycemic drugs: Secondary | ICD-10-CM | POA: Diagnosis not present

## 2023-09-21 DIAGNOSIS — J9611 Chronic respiratory failure with hypoxia: Secondary | ICD-10-CM | POA: Diagnosis not present

## 2023-09-21 DIAGNOSIS — I110 Hypertensive heart disease with heart failure: Secondary | ICD-10-CM | POA: Diagnosis not present

## 2023-09-21 DIAGNOSIS — J9612 Chronic respiratory failure with hypercapnia: Secondary | ICD-10-CM | POA: Diagnosis not present

## 2023-09-21 NOTE — Telephone Encounter (Signed)
**Note De-identified  [place] Obfuscation** Please advise 

## 2023-09-21 NOTE — Telephone Encounter (Signed)
 Copied from CRM [id number]. Topic: Clinical - Medication Question >> Sep 21, 2023  2:01 PM Deaijah H wrote: Reason for CRM: [facility] pharmacist [facility]/ [facility] called in wanting to know if the patient will need ace or arb for CHF. Please call [phone] to provide information

## 2023-09-22 DIAGNOSIS — J9611 Chronic respiratory failure with hypoxia: Secondary | ICD-10-CM | POA: Diagnosis not present

## 2023-09-22 DIAGNOSIS — Z7984 Long term (current) use of oral hypoglycemic drugs: Secondary | ICD-10-CM | POA: Diagnosis not present

## 2023-09-22 DIAGNOSIS — J9612 Chronic respiratory failure with hypercapnia: Secondary | ICD-10-CM | POA: Diagnosis not present

## 2023-09-22 DIAGNOSIS — C859A Non-Hodgkin lymphoma, unspecified, in remission: Secondary | ICD-10-CM | POA: Diagnosis not present

## 2023-09-22 DIAGNOSIS — E662 Morbid (severe) obesity with alveolar hypoventilation: Secondary | ICD-10-CM | POA: Diagnosis not present

## 2023-09-22 DIAGNOSIS — I110 Hypertensive heart disease with heart failure: Secondary | ICD-10-CM | POA: Diagnosis not present

## 2023-09-22 DIAGNOSIS — R7303 Prediabetes: Secondary | ICD-10-CM | POA: Diagnosis not present

## 2023-09-22 DIAGNOSIS — I890 Lymphedema, not elsewhere classified: Secondary | ICD-10-CM | POA: Diagnosis not present

## 2023-09-22 DIAGNOSIS — I5042 Chronic combined systolic (congestive) and diastolic (congestive) heart failure: Secondary | ICD-10-CM | POA: Diagnosis not present

## 2023-09-26 DIAGNOSIS — J9612 Chronic respiratory failure with hypercapnia: Secondary | ICD-10-CM | POA: Diagnosis not present

## 2023-09-26 DIAGNOSIS — J9611 Chronic respiratory failure with hypoxia: Secondary | ICD-10-CM | POA: Diagnosis not present

## 2023-09-26 DIAGNOSIS — Z7984 Long term (current) use of oral hypoglycemic drugs: Secondary | ICD-10-CM | POA: Diagnosis not present

## 2023-09-26 DIAGNOSIS — E662 Morbid (severe) obesity with alveolar hypoventilation: Secondary | ICD-10-CM | POA: Diagnosis not present

## 2023-09-26 DIAGNOSIS — I110 Hypertensive heart disease with heart failure: Secondary | ICD-10-CM | POA: Diagnosis not present

## 2023-09-26 DIAGNOSIS — I890 Lymphedema, not elsewhere classified: Secondary | ICD-10-CM | POA: Diagnosis not present

## 2023-09-26 DIAGNOSIS — I5042 Chronic combined systolic (congestive) and diastolic (congestive) heart failure: Secondary | ICD-10-CM | POA: Diagnosis not present

## 2023-09-26 DIAGNOSIS — R7303 Prediabetes: Secondary | ICD-10-CM | POA: Diagnosis not present

## 2023-09-26 DIAGNOSIS — C859A Non-Hodgkin lymphoma, unspecified, in remission: Secondary | ICD-10-CM | POA: Diagnosis not present

## 2023-09-27 DIAGNOSIS — I5042 Chronic combined systolic (congestive) and diastolic (congestive) heart failure: Secondary | ICD-10-CM | POA: Diagnosis not present

## 2023-09-27 DIAGNOSIS — Z7984 Long term (current) use of oral hypoglycemic drugs: Secondary | ICD-10-CM | POA: Diagnosis not present

## 2023-09-27 DIAGNOSIS — R7303 Prediabetes: Secondary | ICD-10-CM | POA: Diagnosis not present

## 2023-09-27 DIAGNOSIS — J9612 Chronic respiratory failure with hypercapnia: Secondary | ICD-10-CM | POA: Diagnosis not present

## 2023-09-27 DIAGNOSIS — I110 Hypertensive heart disease with heart failure: Secondary | ICD-10-CM | POA: Diagnosis not present

## 2023-09-27 DIAGNOSIS — C859A Non-Hodgkin lymphoma, unspecified, in remission: Secondary | ICD-10-CM | POA: Diagnosis not present

## 2023-09-27 DIAGNOSIS — J9611 Chronic respiratory failure with hypoxia: Secondary | ICD-10-CM | POA: Diagnosis not present

## 2023-09-27 DIAGNOSIS — I890 Lymphedema, not elsewhere classified: Secondary | ICD-10-CM | POA: Diagnosis not present

## 2023-09-27 DIAGNOSIS — E662 Morbid (severe) obesity with alveolar hypoventilation: Secondary | ICD-10-CM | POA: Diagnosis not present

## 2023-10-02 DIAGNOSIS — Z7984 Long term (current) use of oral hypoglycemic drugs: Secondary | ICD-10-CM | POA: Diagnosis not present

## 2023-10-02 DIAGNOSIS — R7303 Prediabetes: Secondary | ICD-10-CM | POA: Diagnosis not present

## 2023-10-02 DIAGNOSIS — J9611 Chronic respiratory failure with hypoxia: Secondary | ICD-10-CM | POA: Diagnosis not present

## 2023-10-02 DIAGNOSIS — C859A Non-Hodgkin lymphoma, unspecified, in remission: Secondary | ICD-10-CM | POA: Diagnosis not present

## 2023-10-02 DIAGNOSIS — I5042 Chronic combined systolic (congestive) and diastolic (congestive) heart failure: Secondary | ICD-10-CM | POA: Diagnosis not present

## 2023-10-02 DIAGNOSIS — J9612 Chronic respiratory failure with hypercapnia: Secondary | ICD-10-CM | POA: Diagnosis not present

## 2023-10-02 DIAGNOSIS — E662 Morbid (severe) obesity with alveolar hypoventilation: Secondary | ICD-10-CM | POA: Diagnosis not present

## 2023-10-02 DIAGNOSIS — I890 Lymphedema, not elsewhere classified: Secondary | ICD-10-CM | POA: Diagnosis not present

## 2023-10-02 DIAGNOSIS — I110 Hypertensive heart disease with heart failure: Secondary | ICD-10-CM | POA: Diagnosis not present

## 2023-10-05 DIAGNOSIS — E662 Morbid (severe) obesity with alveolar hypoventilation: Secondary | ICD-10-CM | POA: Diagnosis not present

## 2023-10-05 DIAGNOSIS — Z7984 Long term (current) use of oral hypoglycemic drugs: Secondary | ICD-10-CM | POA: Diagnosis not present

## 2023-10-05 DIAGNOSIS — I110 Hypertensive heart disease with heart failure: Secondary | ICD-10-CM | POA: Diagnosis not present

## 2023-10-05 DIAGNOSIS — I5042 Chronic combined systolic (congestive) and diastolic (congestive) heart failure: Secondary | ICD-10-CM | POA: Diagnosis not present

## 2023-10-05 DIAGNOSIS — J9611 Chronic respiratory failure with hypoxia: Secondary | ICD-10-CM | POA: Diagnosis not present

## 2023-10-05 DIAGNOSIS — C859A Non-Hodgkin lymphoma, unspecified, in remission: Secondary | ICD-10-CM | POA: Diagnosis not present

## 2023-10-05 DIAGNOSIS — J9612 Chronic respiratory failure with hypercapnia: Secondary | ICD-10-CM | POA: Diagnosis not present

## 2023-10-05 DIAGNOSIS — R7303 Prediabetes: Secondary | ICD-10-CM | POA: Diagnosis not present

## 2023-10-05 DIAGNOSIS — I890 Lymphedema, not elsewhere classified: Secondary | ICD-10-CM | POA: Diagnosis not present

## 2023-10-13 DIAGNOSIS — I5042 Chronic combined systolic (congestive) and diastolic (congestive) heart failure: Secondary | ICD-10-CM | POA: Diagnosis not present

## 2023-10-13 DIAGNOSIS — E662 Morbid (severe) obesity with alveolar hypoventilation: Secondary | ICD-10-CM | POA: Diagnosis not present

## 2023-10-13 DIAGNOSIS — I890 Lymphedema, not elsewhere classified: Secondary | ICD-10-CM | POA: Diagnosis not present

## 2023-10-13 DIAGNOSIS — Z7984 Long term (current) use of oral hypoglycemic drugs: Secondary | ICD-10-CM | POA: Diagnosis not present

## 2023-10-13 DIAGNOSIS — I110 Hypertensive heart disease with heart failure: Secondary | ICD-10-CM | POA: Diagnosis not present

## 2023-10-13 DIAGNOSIS — R7303 Prediabetes: Secondary | ICD-10-CM | POA: Diagnosis not present

## 2023-10-13 DIAGNOSIS — J9611 Chronic respiratory failure with hypoxia: Secondary | ICD-10-CM | POA: Diagnosis not present

## 2023-10-13 DIAGNOSIS — J9612 Chronic respiratory failure with hypercapnia: Secondary | ICD-10-CM | POA: Diagnosis not present

## 2023-10-13 DIAGNOSIS — C859A Non-Hodgkin lymphoma, unspecified, in remission: Secondary | ICD-10-CM | POA: Diagnosis not present

## 2023-10-18 ENCOUNTER — Telehealth: Payer: Self-pay

## 2023-10-18 DIAGNOSIS — J9612 Chronic respiratory failure with hypercapnia: Secondary | ICD-10-CM | POA: Diagnosis not present

## 2023-10-18 DIAGNOSIS — I5042 Chronic combined systolic (congestive) and diastolic (congestive) heart failure: Secondary | ICD-10-CM | POA: Diagnosis not present

## 2023-10-18 DIAGNOSIS — I110 Hypertensive heart disease with heart failure: Secondary | ICD-10-CM | POA: Diagnosis not present

## 2023-10-18 DIAGNOSIS — E662 Morbid (severe) obesity with alveolar hypoventilation: Secondary | ICD-10-CM | POA: Diagnosis not present

## 2023-10-18 DIAGNOSIS — C859A Non-Hodgkin lymphoma, unspecified, in remission: Secondary | ICD-10-CM | POA: Diagnosis not present

## 2023-10-18 DIAGNOSIS — R7303 Prediabetes: Secondary | ICD-10-CM | POA: Diagnosis not present

## 2023-10-18 DIAGNOSIS — I890 Lymphedema, not elsewhere classified: Secondary | ICD-10-CM | POA: Diagnosis not present

## 2023-10-18 DIAGNOSIS — J9611 Chronic respiratory failure with hypoxia: Secondary | ICD-10-CM | POA: Diagnosis not present

## 2023-10-18 DIAGNOSIS — Z7984 Long term (current) use of oral hypoglycemic drugs: Secondary | ICD-10-CM | POA: Diagnosis not present

## 2023-10-18 NOTE — Telephone Encounter (Unsigned)
 Copied from CRM [id number]. Topic: General - Other >> Oct 18, 2023 11:19 AM Suzen RAMAN wrote: Reason for CRM: patient would like a call back from providers nurse pertaining to medication torsemide  (DEMADEX ) 20 MG tablet and how it affects her. When patient takes medication it causes frequent increased urination which cause patient to have to wear pads. Patient would also like to know if something can be generated and submitted to deem incontinence supplies medically necessary for patient insurance to cover it.    CB#[phone] (M)

## 2023-10-18 NOTE — Telephone Encounter (Signed)
 Spoke to pt and she stated that [employer] stated you can write something for PA. Pt stated that they said its not considered incontinent since its medication induced. Please advise.

## 2023-10-18 NOTE — Telephone Encounter (Signed)
 Pt has been scheduled for vv

## 2023-10-19 DIAGNOSIS — E662 Morbid (severe) obesity with alveolar hypoventilation: Secondary | ICD-10-CM | POA: Diagnosis not present

## 2023-10-19 DIAGNOSIS — I890 Lymphedema, not elsewhere classified: Secondary | ICD-10-CM | POA: Diagnosis not present

## 2023-10-19 DIAGNOSIS — I5042 Chronic combined systolic (congestive) and diastolic (congestive) heart failure: Secondary | ICD-10-CM | POA: Diagnosis not present

## 2023-10-19 DIAGNOSIS — R7303 Prediabetes: Secondary | ICD-10-CM | POA: Diagnosis not present

## 2023-10-19 DIAGNOSIS — C859A Non-Hodgkin lymphoma, unspecified, in remission: Secondary | ICD-10-CM | POA: Diagnosis not present

## 2023-10-19 DIAGNOSIS — J9612 Chronic respiratory failure with hypercapnia: Secondary | ICD-10-CM | POA: Diagnosis not present

## 2023-10-19 DIAGNOSIS — J9611 Chronic respiratory failure with hypoxia: Secondary | ICD-10-CM | POA: Diagnosis not present

## 2023-10-19 DIAGNOSIS — Z7984 Long term (current) use of oral hypoglycemic drugs: Secondary | ICD-10-CM | POA: Diagnosis not present

## 2023-10-19 DIAGNOSIS — I110 Hypertensive heart disease with heart failure: Secondary | ICD-10-CM | POA: Diagnosis not present

## 2023-10-24 DIAGNOSIS — I5042 Chronic combined systolic (congestive) and diastolic (congestive) heart failure: Secondary | ICD-10-CM | POA: Diagnosis not present

## 2023-10-24 DIAGNOSIS — E662 Morbid (severe) obesity with alveolar hypoventilation: Secondary | ICD-10-CM | POA: Diagnosis not present

## 2023-10-24 DIAGNOSIS — R7303 Prediabetes: Secondary | ICD-10-CM | POA: Diagnosis not present

## 2023-10-24 DIAGNOSIS — J9611 Chronic respiratory failure with hypoxia: Secondary | ICD-10-CM | POA: Diagnosis not present

## 2023-10-24 DIAGNOSIS — I110 Hypertensive heart disease with heart failure: Secondary | ICD-10-CM | POA: Diagnosis not present

## 2023-10-24 DIAGNOSIS — Z7984 Long term (current) use of oral hypoglycemic drugs: Secondary | ICD-10-CM | POA: Diagnosis not present

## 2023-10-24 DIAGNOSIS — I890 Lymphedema, not elsewhere classified: Secondary | ICD-10-CM | POA: Diagnosis not present

## 2023-10-24 DIAGNOSIS — J9612 Chronic respiratory failure with hypercapnia: Secondary | ICD-10-CM | POA: Diagnosis not present

## 2023-10-24 DIAGNOSIS — C859A Non-Hodgkin lymphoma, unspecified, in remission: Secondary | ICD-10-CM | POA: Diagnosis not present

## 2023-10-25 ENCOUNTER — Telehealth: Admitting: Adult Health

## 2023-11-02 DIAGNOSIS — I110 Hypertensive heart disease with heart failure: Secondary | ICD-10-CM | POA: Diagnosis not present

## 2023-11-02 DIAGNOSIS — I890 Lymphedema, not elsewhere classified: Secondary | ICD-10-CM | POA: Diagnosis not present

## 2023-11-02 DIAGNOSIS — J9611 Chronic respiratory failure with hypoxia: Secondary | ICD-10-CM | POA: Diagnosis not present

## 2023-11-02 DIAGNOSIS — I5042 Chronic combined systolic (congestive) and diastolic (congestive) heart failure: Secondary | ICD-10-CM | POA: Diagnosis not present

## 2023-11-02 DIAGNOSIS — R7303 Prediabetes: Secondary | ICD-10-CM | POA: Diagnosis not present

## 2023-11-02 DIAGNOSIS — J9612 Chronic respiratory failure with hypercapnia: Secondary | ICD-10-CM | POA: Diagnosis not present

## 2023-11-02 DIAGNOSIS — Z7984 Long term (current) use of oral hypoglycemic drugs: Secondary | ICD-10-CM | POA: Diagnosis not present

## 2023-11-02 DIAGNOSIS — E662 Morbid (severe) obesity with alveolar hypoventilation: Secondary | ICD-10-CM | POA: Diagnosis not present

## 2023-11-02 DIAGNOSIS — C859A Non-Hodgkin lymphoma, unspecified, in remission: Secondary | ICD-10-CM | POA: Diagnosis not present

## 2023-11-03 DIAGNOSIS — I110 Hypertensive heart disease with heart failure: Secondary | ICD-10-CM | POA: Diagnosis not present

## 2023-11-03 DIAGNOSIS — I890 Lymphedema, not elsewhere classified: Secondary | ICD-10-CM | POA: Diagnosis not present

## 2023-11-03 DIAGNOSIS — J9612 Chronic respiratory failure with hypercapnia: Secondary | ICD-10-CM | POA: Diagnosis not present

## 2023-11-03 DIAGNOSIS — I5042 Chronic combined systolic (congestive) and diastolic (congestive) heart failure: Secondary | ICD-10-CM | POA: Diagnosis not present

## 2023-11-03 DIAGNOSIS — E662 Morbid (severe) obesity with alveolar hypoventilation: Secondary | ICD-10-CM | POA: Diagnosis not present

## 2023-11-03 DIAGNOSIS — R7303 Prediabetes: Secondary | ICD-10-CM | POA: Diagnosis not present

## 2023-11-03 DIAGNOSIS — Z7984 Long term (current) use of oral hypoglycemic drugs: Secondary | ICD-10-CM | POA: Diagnosis not present

## 2023-11-03 DIAGNOSIS — J9611 Chronic respiratory failure with hypoxia: Secondary | ICD-10-CM | POA: Diagnosis not present

## 2023-11-03 DIAGNOSIS — C859A Non-Hodgkin lymphoma, unspecified, in remission: Secondary | ICD-10-CM | POA: Diagnosis not present

## 2023-11-06 DIAGNOSIS — I890 Lymphedema, not elsewhere classified: Secondary | ICD-10-CM | POA: Diagnosis not present

## 2023-11-06 DIAGNOSIS — Z7984 Long term (current) use of oral hypoglycemic drugs: Secondary | ICD-10-CM | POA: Diagnosis not present

## 2023-11-06 DIAGNOSIS — I110 Hypertensive heart disease with heart failure: Secondary | ICD-10-CM | POA: Diagnosis not present

## 2023-11-06 DIAGNOSIS — R7303 Prediabetes: Secondary | ICD-10-CM | POA: Diagnosis not present

## 2023-11-06 DIAGNOSIS — C859A Non-Hodgkin lymphoma, unspecified, in remission: Secondary | ICD-10-CM | POA: Diagnosis not present

## 2023-11-06 DIAGNOSIS — J9611 Chronic respiratory failure with hypoxia: Secondary | ICD-10-CM | POA: Diagnosis not present

## 2023-11-06 DIAGNOSIS — I5042 Chronic combined systolic (congestive) and diastolic (congestive) heart failure: Secondary | ICD-10-CM | POA: Diagnosis not present

## 2023-11-06 DIAGNOSIS — E662 Morbid (severe) obesity with alveolar hypoventilation: Secondary | ICD-10-CM | POA: Diagnosis not present

## 2023-11-06 DIAGNOSIS — J9612 Chronic respiratory failure with hypercapnia: Secondary | ICD-10-CM | POA: Diagnosis not present

## 2023-11-10 DIAGNOSIS — J9611 Chronic respiratory failure with hypoxia: Secondary | ICD-10-CM | POA: Diagnosis not present

## 2023-11-10 DIAGNOSIS — I110 Hypertensive heart disease with heart failure: Secondary | ICD-10-CM | POA: Diagnosis not present

## 2023-11-10 DIAGNOSIS — E662 Morbid (severe) obesity with alveolar hypoventilation: Secondary | ICD-10-CM | POA: Diagnosis not present

## 2023-11-10 DIAGNOSIS — C859A Non-Hodgkin lymphoma, unspecified, in remission: Secondary | ICD-10-CM | POA: Diagnosis not present

## 2023-11-10 DIAGNOSIS — R7303 Prediabetes: Secondary | ICD-10-CM | POA: Diagnosis not present

## 2023-11-10 DIAGNOSIS — I5042 Chronic combined systolic (congestive) and diastolic (congestive) heart failure: Secondary | ICD-10-CM | POA: Diagnosis not present

## 2023-11-10 DIAGNOSIS — J9612 Chronic respiratory failure with hypercapnia: Secondary | ICD-10-CM | POA: Diagnosis not present

## 2023-11-10 DIAGNOSIS — I890 Lymphedema, not elsewhere classified: Secondary | ICD-10-CM | POA: Diagnosis not present

## 2023-11-10 DIAGNOSIS — Z7984 Long term (current) use of oral hypoglycemic drugs: Secondary | ICD-10-CM | POA: Diagnosis not present

## 2023-11-21 ENCOUNTER — Other Ambulatory Visit: Payer: Self-pay | Admitting: Adult Health

## 2023-11-28 ENCOUNTER — Other Ambulatory Visit: Payer: Self-pay | Admitting: Adult Health

## 2023-11-28 DIAGNOSIS — M150 Primary generalized (osteo)arthritis: Secondary | ICD-10-CM

## 2023-11-29 NOTE — Telephone Encounter (Signed)
 Okay for refill?

## 2023-12-07 ENCOUNTER — Other Ambulatory Visit: Payer: Self-pay | Admitting: Adult Health

## 2023-12-07 DIAGNOSIS — I10 Essential (primary) hypertension: Secondary | ICD-10-CM

## 2023-12-13 ENCOUNTER — Other Ambulatory Visit (HOSPITAL_COMMUNITY): Payer: Self-pay

## 2023-12-13 ENCOUNTER — Other Ambulatory Visit: Payer: Self-pay

## 2023-12-13 ENCOUNTER — Encounter: Payer: Self-pay | Admitting: Adult Health

## 2023-12-13 MED FILL — Tramadol HCl Tab 50 MG: ORAL | 10 days supply | Qty: 30 | Fill #0 | Status: AC

## 2023-12-14 ENCOUNTER — Other Ambulatory Visit: Payer: Self-pay

## 2024-01-09 ENCOUNTER — Other Ambulatory Visit: Payer: Self-pay

## 2024-01-10 ENCOUNTER — Other Ambulatory Visit: Payer: Self-pay

## 2024-01-10 ENCOUNTER — Other Ambulatory Visit (HOSPITAL_COMMUNITY): Payer: Self-pay

## 2024-01-10 MED ORDER — TORSEMIDE 20 MG PO TABS
20.0000 mg | ORAL_TABLET | Freq: Two times a day (BID) | ORAL | 1 refills | Status: AC
  Filled 2024-01-10: qty 180, 90d supply, fill #0

## 2024-01-22 ENCOUNTER — Other Ambulatory Visit (HOSPITAL_COMMUNITY): Payer: Self-pay

## 2024-01-22 ENCOUNTER — Other Ambulatory Visit: Payer: Self-pay | Admitting: Adult Health

## 2024-01-22 ENCOUNTER — Other Ambulatory Visit: Payer: Self-pay

## 2024-01-22 DIAGNOSIS — M150 Primary generalized (osteo)arthritis: Secondary | ICD-10-CM

## 2024-01-23 ENCOUNTER — Other Ambulatory Visit (HOSPITAL_COMMUNITY): Payer: Self-pay

## 2024-01-23 ENCOUNTER — Other Ambulatory Visit: Payer: Self-pay

## 2024-01-23 MED ORDER — TRAMADOL HCL 50 MG PO TABS
50.0000 mg | ORAL_TABLET | Freq: Three times a day (TID) | ORAL | 1 refills | Status: AC | PRN
  Filled 2024-01-23: qty 30, 10d supply, fill #0
  Filled 2024-02-21: qty 30, 10d supply, fill #1

## 2024-01-23 NOTE — Telephone Encounter (Signed)
 Okay for refill?

## 2024-02-01 NOTE — Progress Notes (Signed)
° °  02/01/2024  Patient ID: Ronal Maffucci, female   DOB: Dec 30, 1950, 73 y.o.   MRN: [id number]  Pharmacy Quality Measure Review  This patient is appearing on a report for being at risk of failing the adherence measure for diabetes medications this calendar year.   Medication: Metformin  Last fill date: 01/23/24 for 90 day supply  Insurance report was not up to date. No action needed at this time.   Jon VEAR Lindau, PharmD Clinical Pharmacist [phone]

## 2024-02-21 ENCOUNTER — Other Ambulatory Visit (HOSPITAL_COMMUNITY): Payer: Self-pay

## 2024-02-21 ENCOUNTER — Other Ambulatory Visit: Payer: Self-pay

## 2024-02-23 ENCOUNTER — Other Ambulatory Visit (HOSPITAL_COMMUNITY): Payer: Self-pay

## 2024-02-23 ENCOUNTER — Other Ambulatory Visit: Payer: Self-pay

## 2024-02-23 ENCOUNTER — Encounter: Payer: Self-pay | Admitting: Pharmacist

## 2024-03-04 ENCOUNTER — Other Ambulatory Visit: Payer: Self-pay

## 2024-03-04 MED FILL — Potassium Chloride Microencapsulated Crys ER Tab 10 mEq: ORAL | 90 days supply | Qty: 90 | Fill #0 | Status: CN

## 2024-03-05 ENCOUNTER — Other Ambulatory Visit (HOSPITAL_COMMUNITY): Payer: Self-pay

## 2024-03-05 ENCOUNTER — Other Ambulatory Visit: Payer: Self-pay

## 2024-03-05 MED FILL — Potassium Chloride Microencapsulated Crys ER Tab 10 mEq: ORAL | 90 days supply | Qty: 90 | Fill #0 | Status: AC

## 2024-03-19 ENCOUNTER — Ambulatory Visit: Admitting: Adult Health

## 2024-04-19 ENCOUNTER — Ambulatory Visit: Admitting: Adult Health

## 2024-05-31 ENCOUNTER — Ambulatory Visit: Admitting: Adult Health

## 2024-06-03 ENCOUNTER — Ambulatory Visit
